# Patient Record
Sex: Male | Born: 1940 | Race: White | Hispanic: No | Smoking: Former smoker
Health system: Southern US, Community
[De-identification: ages and names within clinical notes are randomized; demographics above are authoritative.]

## PROBLEM LIST (undated history)

## (undated) DIAGNOSIS — F419 Anxiety disorder, unspecified: Secondary | ICD-10-CM

## (undated) DIAGNOSIS — IMO0002 Reserved for concepts with insufficient information to code with codable children: Secondary | ICD-10-CM

## (undated) DIAGNOSIS — K4090 Unilateral inguinal hernia, without obstruction or gangrene, not specified as recurrent: Secondary | ICD-10-CM

## (undated) DIAGNOSIS — K750 Abscess of liver: Secondary | ICD-10-CM

## (undated) DIAGNOSIS — E785 Hyperlipidemia, unspecified: Secondary | ICD-10-CM

## (undated) DIAGNOSIS — N183 Chronic kidney disease, stage 3 (moderate): Secondary | ICD-10-CM

## (undated) DIAGNOSIS — K8020 Calculus of gallbladder without cholecystitis without obstruction: Secondary | ICD-10-CM

## (undated) DIAGNOSIS — F32A Depression, unspecified: Secondary | ICD-10-CM

## (undated) DIAGNOSIS — K5730 Diverticulosis of large intestine without perforation or abscess without bleeding: Secondary | ICD-10-CM

## (undated) DIAGNOSIS — I4510 Unspecified right bundle-branch block: Secondary | ICD-10-CM

## (undated) DIAGNOSIS — N400 Enlarged prostate without lower urinary tract symptoms: Secondary | ICD-10-CM

## (undated) DIAGNOSIS — I959 Hypotension, unspecified: Secondary | ICD-10-CM

## (undated) DIAGNOSIS — J309 Allergic rhinitis, unspecified: Secondary | ICD-10-CM

## (undated) DIAGNOSIS — K8050 Calculus of bile duct without cholangitis or cholecystitis without obstruction: Secondary | ICD-10-CM

## (undated) DIAGNOSIS — M719 Bursopathy, unspecified: Secondary | ICD-10-CM

## (undated) DIAGNOSIS — T7840XA Allergy, unspecified, initial encounter: Secondary | ICD-10-CM

## (undated) DIAGNOSIS — F528 Other sexual dysfunction not due to a substance or known physiological condition: Secondary | ICD-10-CM

## (undated) DIAGNOSIS — I10 Essential (primary) hypertension: Secondary | ICD-10-CM

## (undated) DIAGNOSIS — M67919 Unspecified disorder of synovium and tendon, unspecified shoulder: Secondary | ICD-10-CM

## (undated) DIAGNOSIS — L989 Disorder of the skin and subcutaneous tissue, unspecified: Secondary | ICD-10-CM

## (undated) DIAGNOSIS — Z86010 Personal history of colonic polyps: Secondary | ICD-10-CM

## (undated) DIAGNOSIS — I2510 Atherosclerotic heart disease of native coronary artery without angina pectoris: Secondary | ICD-10-CM

## (undated) DIAGNOSIS — M1990 Unspecified osteoarthritis, unspecified site: Secondary | ICD-10-CM

## (undated) DIAGNOSIS — E119 Type 2 diabetes mellitus without complications: Secondary | ICD-10-CM

## (undated) HISTORY — DX: Reserved for concepts with insufficient information to code with codable children: IMO0002

## (undated) HISTORY — DX: Benign prostatic hyperplasia without lower urinary tract symptoms: N40.0

## (undated) HISTORY — DX: Atherosclerotic heart disease of native coronary artery without angina pectoris: I25.10

## (undated) HISTORY — DX: Other sexual dysfunction not due to a substance or known physiological condition: F52.8

## (undated) HISTORY — PX: FINGER SURGERY: SHX640

## (undated) HISTORY — DX: Allergic rhinitis, unspecified: J30.9

## (undated) HISTORY — DX: Unspecified right bundle-branch block: I45.10

## (undated) HISTORY — PX: TONSILLECTOMY: SUR1361

## (undated) HISTORY — PX: HEMORRHOID SURGERY: SHX153

## (undated) HISTORY — DX: Calculus of bile duct without cholangitis or cholecystitis without obstruction: K80.50

## (undated) HISTORY — DX: Chronic kidney disease, stage 3 (moderate): N18.3

## (undated) HISTORY — DX: Calculus of gallbladder without cholecystitis without obstruction: K80.20

## (undated) HISTORY — DX: Hyperlipidemia, unspecified: E78.5

## (undated) HISTORY — DX: Diverticulosis of large intestine without perforation or abscess without bleeding: K57.30

## (undated) HISTORY — PX: COLONOSCOPY: SHX174

## (undated) HISTORY — DX: Type 2 diabetes mellitus without complications: E11.9

## (undated) HISTORY — DX: Essential (primary) hypertension: I10

## (undated) HISTORY — DX: Hypotension, unspecified: I95.9

## (undated) HISTORY — PX: POLYPECTOMY: SHX149

## (undated) HISTORY — DX: Unspecified disorder of synovium and tendon, unspecified shoulder: M67.919

## (undated) HISTORY — DX: Personal history of colonic polyps: Z86.010

## (undated) HISTORY — DX: Bursopathy, unspecified: M71.9

## (undated) HISTORY — PX: INGUINAL HERNIA REPAIR: SUR1180

## (undated) HISTORY — DX: Allergy, unspecified, initial encounter: T78.40XA

## (undated) HISTORY — DX: Disorder of the skin and subcutaneous tissue, unspecified: L98.9

## (undated) HISTORY — DX: Unspecified osteoarthritis, unspecified site: M19.90

## (undated) HISTORY — DX: Unilateral inguinal hernia, without obstruction or gangrene, not specified as recurrent: K40.90

---

## 1961-07-24 DIAGNOSIS — H9319 Tinnitus, unspecified ear: Secondary | ICD-10-CM | POA: Insufficient documentation

## 1970-10-15 HISTORY — PX: WISDOM TOOTH EXTRACTION: SHX21

## 1971-10-16 HISTORY — PX: HEMORRHOID SURGERY: SHX153

## 2000-02-06 ENCOUNTER — Ambulatory Visit (HOSPITAL_COMMUNITY): Admission: RE | Admit: 2000-02-06 | Discharge: 2000-02-06 | Payer: Self-pay | Admitting: Gastroenterology

## 2000-02-06 ENCOUNTER — Encounter (INDEPENDENT_AMBULATORY_CARE_PROVIDER_SITE_OTHER): Payer: Self-pay | Admitting: *Deleted

## 2000-02-06 ENCOUNTER — Encounter: Payer: Self-pay | Admitting: Internal Medicine

## 2002-05-14 ENCOUNTER — Encounter: Payer: Self-pay | Admitting: Internal Medicine

## 2002-08-28 ENCOUNTER — Encounter: Payer: Self-pay | Admitting: Internal Medicine

## 2002-08-28 ENCOUNTER — Ambulatory Visit (HOSPITAL_COMMUNITY): Admission: RE | Admit: 2002-08-28 | Discharge: 2002-08-28 | Payer: Self-pay | Admitting: Gastroenterology

## 2002-08-28 ENCOUNTER — Encounter (INDEPENDENT_AMBULATORY_CARE_PROVIDER_SITE_OTHER): Payer: Self-pay | Admitting: Specialist

## 2003-05-10 ENCOUNTER — Encounter: Payer: Self-pay | Admitting: Internal Medicine

## 2003-05-10 ENCOUNTER — Encounter (INDEPENDENT_AMBULATORY_CARE_PROVIDER_SITE_OTHER): Payer: Self-pay | Admitting: *Deleted

## 2003-05-10 ENCOUNTER — Ambulatory Visit (HOSPITAL_COMMUNITY): Admission: RE | Admit: 2003-05-10 | Discharge: 2003-05-10 | Payer: Self-pay | Admitting: Gastroenterology

## 2004-10-18 ENCOUNTER — Ambulatory Visit: Payer: Self-pay | Admitting: Internal Medicine

## 2005-10-22 ENCOUNTER — Ambulatory Visit: Payer: Self-pay | Admitting: Internal Medicine

## 2007-06-03 ENCOUNTER — Ambulatory Visit: Payer: Self-pay | Admitting: Internal Medicine

## 2007-06-03 LAB — CONVERTED CEMR LAB
ALT: 17 units/L (ref 0–53)
AST: 21 units/L (ref 0–37)
Albumin: 4.1 g/dL (ref 3.5–5.2)
Alkaline Phosphatase: 87 units/L (ref 39–117)
BUN: 11 mg/dL (ref 6–23)
Basophils Absolute: 0 10*3/uL (ref 0.0–0.1)
Basophils Relative: 0.4 % (ref 0.0–1.0)
Bilirubin Urine: NEGATIVE
Bilirubin, Direct: 0.1 mg/dL (ref 0.0–0.3)
CO2: 31 meq/L (ref 19–32)
Calcium: 9.5 mg/dL (ref 8.4–10.5)
Chloride: 108 meq/L (ref 96–112)
Cholesterol: 216 mg/dL (ref 0–200)
Creatinine, Ser: 1.2 mg/dL (ref 0.4–1.5)
Direct LDL: 113.4 mg/dL
Eosinophils Absolute: 0 10*3/uL (ref 0.0–0.6)
Eosinophils Relative: 0.4 % (ref 0.0–5.0)
GFR calc Af Amer: 78 mL/min
GFR calc non Af Amer: 65 mL/min
Glucose, Bld: 81 mg/dL (ref 70–99)
HCT: 50 % (ref 39.0–52.0)
HDL: 31.7 mg/dL — ABNORMAL LOW (ref >39.0)
Hemoglobin, Urine: NEGATIVE
Hemoglobin: 17 g/dL (ref 13.0–17.0)
Hgb A1c MFr Bld: 6.2 % — ABNORMAL HIGH (ref 4.6–6.0)
Ketones, ur: NEGATIVE mg/dL
Leukocytes, UA: NEGATIVE
Lymphocytes Relative: 21.6 % (ref 12.0–46.0)
MCHC: 33.9 g/dL (ref 30.0–36.0)
MCV: 83.6 fL (ref 78.0–100.0)
Monocytes Absolute: 1 10*3/uL — ABNORMAL HIGH (ref 0.2–0.7)
Monocytes Relative: 10.8 % (ref 3.0–11.0)
Neutro Abs: 6.1 10*3/uL (ref 1.4–7.7)
Neutrophils Relative %: 66.8 % (ref 43.0–77.0)
Nitrite: NEGATIVE
PSA: 3.01 ng/mL (ref 0.10–4.00)
Platelets: 210 10*3/uL (ref 150–400)
Potassium: 4 meq/L (ref 3.5–5.1)
RBC: 5.98 M/uL — ABNORMAL HIGH (ref 4.22–5.81)
RDW: 13.9 % (ref 11.5–14.6)
Sodium: 145 meq/L (ref 135–145)
Specific Gravity, Urine: 1.01 (ref 1.000–1.03)
TSH: 0.7 microintl units/mL (ref 0.35–5.50)
Total Bilirubin: 0.8 mg/dL (ref 0.3–1.2)
Total CHOL/HDL Ratio: 6.8
Total Protein, Urine: NEGATIVE mg/dL
Total Protein: 7.5 g/dL (ref 6.0–8.3)
Triglycerides: 420 mg/dL (ref 0–149)
Urine Glucose: 250 mg/dL — AB
Urobilinogen, UA: 0.2 (ref 0.0–1.0)
VLDL: 84 mg/dL — ABNORMAL HIGH (ref 0–40)
WBC: 9 10*3/uL (ref 4.5–10.5)
pH: 6 (ref 5.0–8.0)

## 2007-06-05 DIAGNOSIS — N400 Enlarged prostate without lower urinary tract symptoms: Secondary | ICD-10-CM

## 2007-06-05 DIAGNOSIS — Z860100 Personal history of colon polyps, unspecified: Secondary | ICD-10-CM

## 2007-06-05 DIAGNOSIS — I10 Essential (primary) hypertension: Secondary | ICD-10-CM

## 2007-06-05 DIAGNOSIS — E785 Hyperlipidemia, unspecified: Secondary | ICD-10-CM | POA: Insufficient documentation

## 2007-06-05 DIAGNOSIS — F528 Other sexual dysfunction not due to a substance or known physiological condition: Secondary | ICD-10-CM

## 2007-06-05 HISTORY — DX: Other sexual dysfunction not due to a substance or known physiological condition: F52.8

## 2007-06-05 HISTORY — DX: Personal history of colon polyps, unspecified: Z86.0100

## 2007-06-05 HISTORY — DX: Benign prostatic hyperplasia without lower urinary tract symptoms: N40.0

## 2007-06-05 HISTORY — DX: Personal history of colonic polyps: Z86.010

## 2007-06-05 HISTORY — DX: Essential (primary) hypertension: I10

## 2007-06-05 HISTORY — DX: Hyperlipidemia, unspecified: E78.5

## 2007-12-04 ENCOUNTER — Ambulatory Visit: Payer: Self-pay | Admitting: Internal Medicine

## 2007-12-04 DIAGNOSIS — E119 Type 2 diabetes mellitus without complications: Secondary | ICD-10-CM | POA: Insufficient documentation

## 2007-12-04 DIAGNOSIS — J309 Allergic rhinitis, unspecified: Secondary | ICD-10-CM

## 2007-12-04 DIAGNOSIS — J069 Acute upper respiratory infection, unspecified: Secondary | ICD-10-CM | POA: Insufficient documentation

## 2007-12-04 DIAGNOSIS — R739 Hyperglycemia, unspecified: Secondary | ICD-10-CM | POA: Insufficient documentation

## 2007-12-04 HISTORY — DX: Allergic rhinitis, unspecified: J30.9

## 2007-12-04 HISTORY — DX: Type 2 diabetes mellitus without complications: E11.9

## 2008-04-01 ENCOUNTER — Telehealth: Payer: Self-pay | Admitting: Internal Medicine

## 2008-05-28 ENCOUNTER — Ambulatory Visit: Payer: Self-pay | Admitting: Internal Medicine

## 2008-05-28 LAB — CONVERTED CEMR LAB
ALT: 21 units/L (ref 0–53)
AST: 18 units/L (ref 0–37)
Albumin: 3.8 g/dL (ref 3.5–5.2)
Alkaline Phosphatase: 55 units/L (ref 39–117)
BUN: 6 mg/dL (ref 6–23)
Basophils Absolute: 0 10*3/uL (ref 0.0–0.1)
Basophils Relative: 0.7 % (ref 0.0–3.0)
Bilirubin Urine: NEGATIVE
Bilirubin, Direct: 0.1 mg/dL (ref 0.0–0.3)
CO2: 26 meq/L (ref 19–32)
Calcium: 8.9 mg/dL (ref 8.4–10.5)
Chloride: 106 meq/L (ref 96–112)
Cholesterol: 215 mg/dL (ref 0–200)
Creatinine, Ser: 1.2 mg/dL (ref 0.4–1.5)
Direct LDL: 110.3 mg/dL
Eosinophils Absolute: 0.1 10*3/uL (ref 0.0–0.7)
Eosinophils Relative: 1.2 % (ref 0.0–5.0)
GFR calc Af Amer: 78 mL/min
GFR calc non Af Amer: 64 mL/min
Glucose, Bld: 134 mg/dL — ABNORMAL HIGH (ref 70–99)
HCT: 49.3 % (ref 39.0–52.0)
HDL: 30.6 mg/dL — ABNORMAL LOW (ref >39.0)
Hemoglobin, Urine: NEGATIVE
Hemoglobin: 16.7 g/dL (ref 13.0–17.0)
Hgb A1c MFr Bld: 6.5 % — ABNORMAL HIGH (ref 4.6–6.0)
Ketones, ur: NEGATIVE mg/dL
Leukocytes, UA: NEGATIVE
Lymphocytes Relative: 27 % (ref 12.0–46.0)
MCHC: 33.8 g/dL (ref 30.0–36.0)
MCV: 87.1 fL (ref 78.0–100.0)
Monocytes Absolute: 0.7 10*3/uL (ref 0.1–1.0)
Monocytes Relative: 10.3 % (ref 3.0–12.0)
Neutro Abs: 4.2 10*3/uL (ref 1.4–7.7)
Neutrophils Relative %: 60.8 % (ref 43.0–77.0)
Nitrite: NEGATIVE
PSA: 1.58 ng/mL (ref 0.10–4.00)
Platelets: 185 10*3/uL (ref 150–400)
Potassium: 3.9 meq/L (ref 3.5–5.1)
RBC: 5.66 M/uL (ref 4.22–5.81)
RDW: 13.2 % (ref 11.5–14.6)
Sodium: 140 meq/L (ref 135–145)
Specific Gravity, Urine: 1.01 (ref 1.000–1.03)
TSH: 0.74 microintl units/mL (ref 0.35–5.50)
Total Bilirubin: 0.9 mg/dL (ref 0.3–1.2)
Total CHOL/HDL Ratio: 7
Total Protein, Urine: NEGATIVE mg/dL
Total Protein: 6.7 g/dL (ref 6.0–8.3)
Triglycerides: 400 mg/dL (ref 0–149)
Urine Glucose: NEGATIVE mg/dL
Urobilinogen, UA: 0.2 (ref 0.0–1.0)
VLDL: 80 mg/dL — ABNORMAL HIGH (ref 0–40)
WBC: 6.9 10*3/uL (ref 4.5–10.5)
pH: 6.5 (ref 5.0–8.0)

## 2008-06-03 ENCOUNTER — Encounter (INDEPENDENT_AMBULATORY_CARE_PROVIDER_SITE_OTHER): Payer: Self-pay | Admitting: *Deleted

## 2008-06-03 ENCOUNTER — Ambulatory Visit: Payer: Self-pay | Admitting: Internal Medicine

## 2008-06-03 DIAGNOSIS — L989 Disorder of the skin and subcutaneous tissue, unspecified: Secondary | ICD-10-CM

## 2008-06-03 HISTORY — DX: Disorder of the skin and subcutaneous tissue, unspecified: L98.9

## 2008-06-10 ENCOUNTER — Encounter: Payer: Self-pay | Admitting: Internal Medicine

## 2008-06-17 ENCOUNTER — Telehealth (INDEPENDENT_AMBULATORY_CARE_PROVIDER_SITE_OTHER): Payer: Self-pay | Admitting: *Deleted

## 2008-06-18 ENCOUNTER — Encounter: Payer: Self-pay | Admitting: Internal Medicine

## 2008-07-15 ENCOUNTER — Ambulatory Visit: Payer: Self-pay | Admitting: Internal Medicine

## 2008-07-27 ENCOUNTER — Telehealth: Payer: Self-pay | Admitting: Internal Medicine

## 2008-07-27 LAB — CONVERTED CEMR LAB
Cholesterol: 222 mg/dL (ref 0–200)
Direct LDL: 120 mg/dL
HDL: 31.9 mg/dL — ABNORMAL LOW (ref >39.0)
Total CHOL/HDL Ratio: 7
Triglycerides: 388 mg/dL (ref 0–149)
VLDL: 78 mg/dL — ABNORMAL HIGH (ref 0–40)

## 2008-07-29 ENCOUNTER — Telehealth: Payer: Self-pay | Admitting: Internal Medicine

## 2008-08-11 ENCOUNTER — Encounter: Payer: Self-pay | Admitting: Internal Medicine

## 2008-09-02 ENCOUNTER — Ambulatory Visit: Payer: Self-pay | Admitting: Internal Medicine

## 2008-09-02 LAB — CONVERTED CEMR LAB
Cholesterol: 221 mg/dL (ref 0–200)
Direct LDL: 104.8 mg/dL
HDL: 36 mg/dL — ABNORMAL LOW (ref >39.0)
Total CHOL/HDL Ratio: 6.1
Triglycerides: 285 mg/dL (ref 0–149)
VLDL: 57 mg/dL — ABNORMAL HIGH (ref 0–40)

## 2008-11-18 ENCOUNTER — Ambulatory Visit: Payer: Self-pay | Admitting: Internal Medicine

## 2008-12-02 ENCOUNTER — Ambulatory Visit: Payer: Self-pay | Admitting: Internal Medicine

## 2008-12-02 ENCOUNTER — Encounter: Payer: Self-pay | Admitting: Internal Medicine

## 2008-12-06 ENCOUNTER — Encounter: Payer: Self-pay | Admitting: Internal Medicine

## 2008-12-30 ENCOUNTER — Telehealth (INDEPENDENT_AMBULATORY_CARE_PROVIDER_SITE_OTHER): Payer: Self-pay | Admitting: *Deleted

## 2008-12-31 ENCOUNTER — Telehealth: Payer: Self-pay | Admitting: Internal Medicine

## 2009-01-21 ENCOUNTER — Encounter: Payer: Self-pay | Admitting: Internal Medicine

## 2009-07-15 ENCOUNTER — Ambulatory Visit: Payer: Self-pay | Admitting: Internal Medicine

## 2009-07-15 LAB — CONVERTED CEMR LAB
ALT: 23 units/L (ref 0–53)
AST: 18 units/L (ref 0–37)
Albumin: 3.9 g/dL (ref 3.5–5.2)
Alkaline Phosphatase: 52 units/L (ref 39–117)
BUN: 13 mg/dL (ref 6–23)
Basophils Absolute: 0 10*3/uL (ref 0.0–0.1)
Basophils Relative: 0.4 % (ref 0.0–3.0)
Bilirubin Urine: NEGATIVE
Bilirubin, Direct: 0.1 mg/dL (ref 0.0–0.3)
CO2: 30 meq/L (ref 19–32)
Calcium: 9.3 mg/dL (ref 8.4–10.5)
Chloride: 107 meq/L (ref 96–112)
Cholesterol: 212 mg/dL — ABNORMAL HIGH (ref 0–200)
Creatinine, Ser: 1.4 mg/dL (ref 0.4–1.5)
Direct LDL: 121.7 mg/dL
Eosinophils Absolute: 0.1 10*3/uL (ref 0.0–0.7)
Eosinophils Relative: 1.3 % (ref 0.0–5.0)
GFR calc non Af Amer: 53.69 mL/min (ref >60)
Glucose, Bld: 118 mg/dL — ABNORMAL HIGH (ref 70–99)
HCT: 49.2 % (ref 39.0–52.0)
HDL: 32.1 mg/dL — ABNORMAL LOW (ref >39.00)
Hemoglobin, Urine: NEGATIVE
Hemoglobin: 16.5 g/dL (ref 13.0–17.0)
Hgb A1c MFr Bld: 6.5 % (ref 4.6–6.5)
Ketones, ur: NEGATIVE mg/dL
Leukocytes, UA: NEGATIVE
Lymphocytes Relative: 26.1 % (ref 12.0–46.0)
Lymphs Abs: 1.9 10*3/uL (ref 0.7–4.0)
MCHC: 33.6 g/dL (ref 30.0–36.0)
MCV: 86.9 fL (ref 78.0–100.0)
Monocytes Absolute: 0.8 10*3/uL (ref 0.1–1.0)
Monocytes Relative: 10.7 % (ref 3.0–12.0)
Neutro Abs: 4.4 10*3/uL (ref 1.4–7.7)
Neutrophils Relative %: 61.5 % (ref 43.0–77.0)
Nitrite: NEGATIVE
PSA: 1.88 ng/mL (ref 0.10–4.00)
Platelets: 167 10*3/uL (ref 150.0–400.0)
Potassium: 4 meq/L (ref 3.5–5.1)
RBC: 5.66 M/uL (ref 4.22–5.81)
RDW: 13.5 % (ref 11.5–14.6)
Sodium: 140 meq/L (ref 135–145)
Specific Gravity, Urine: <=1.005 (ref 1.000–1.030)
TSH: 0.59 microintl units/mL (ref 0.35–5.50)
Total Bilirubin: 0.6 mg/dL (ref 0.3–1.2)
Total CHOL/HDL Ratio: 7
Total Protein, Urine: NEGATIVE mg/dL
Total Protein: 7.1 g/dL (ref 6.0–8.3)
Triglycerides: 260 mg/dL — ABNORMAL HIGH (ref 0.0–149.0)
Urine Glucose: NEGATIVE mg/dL
Urobilinogen, UA: 0.2 (ref 0.0–1.0)
VLDL: 52 mg/dL — ABNORMAL HIGH (ref 0.0–40.0)
WBC: 7.2 10*3/uL (ref 4.5–10.5)
pH: 6 (ref 5.0–8.0)

## 2009-07-21 ENCOUNTER — Ambulatory Visit: Payer: Self-pay | Admitting: Internal Medicine

## 2009-07-21 DIAGNOSIS — K5730 Diverticulosis of large intestine without perforation or abscess without bleeding: Secondary | ICD-10-CM | POA: Insufficient documentation

## 2009-07-21 DIAGNOSIS — I4510 Unspecified right bundle-branch block: Secondary | ICD-10-CM

## 2009-07-21 HISTORY — DX: Diverticulosis of large intestine without perforation or abscess without bleeding: K57.30

## 2009-07-21 HISTORY — DX: Unspecified right bundle-branch block: I45.10

## 2009-09-28 ENCOUNTER — Emergency Department (HOSPITAL_COMMUNITY): Admission: EM | Admit: 2009-09-28 | Discharge: 2009-09-28 | Payer: Self-pay | Admitting: Emergency Medicine

## 2010-06-06 ENCOUNTER — Encounter: Payer: Self-pay | Admitting: Internal Medicine

## 2010-06-06 ENCOUNTER — Telehealth: Payer: Self-pay | Admitting: Internal Medicine

## 2010-09-29 ENCOUNTER — Ambulatory Visit: Payer: Self-pay | Admitting: Internal Medicine

## 2010-11-02 ENCOUNTER — Other Ambulatory Visit: Payer: Self-pay | Admitting: Internal Medicine

## 2010-11-02 ENCOUNTER — Ambulatory Visit
Admission: RE | Admit: 2010-11-02 | Discharge: 2010-11-02 | Payer: Self-pay | Source: Home / Self Care | Attending: Internal Medicine | Admitting: Internal Medicine

## 2010-11-02 LAB — CBC WITH DIFFERENTIAL/PLATELET
Basophils Absolute: 0 10*3/uL (ref 0.0–0.1)
Basophils Relative: 0.4 % (ref 0.0–3.0)
Eosinophils Absolute: 0.1 10*3/uL (ref 0.0–0.7)
Eosinophils Relative: 0.9 % (ref 0.0–5.0)
HCT: 46.5 % (ref 39.0–52.0)
Hemoglobin: 15.9 g/dL (ref 13.0–17.0)
Lymphocytes Relative: 23 % (ref 12.0–46.0)
Lymphs Abs: 2 10*3/uL (ref 0.7–4.0)
MCHC: 34.2 g/dL (ref 30.0–36.0)
MCV: 87.9 fl (ref 78.0–100.0)
Monocytes Absolute: 1 10*3/uL (ref 0.1–1.0)
Monocytes Relative: 12.2 % — ABNORMAL HIGH (ref 3.0–12.0)
Neutro Abs: 5.4 10*3/uL (ref 1.4–7.7)
Neutrophils Relative %: 63.5 % (ref 43.0–77.0)
Platelets: 188 10*3/uL (ref 150.0–400.0)
RBC: 5.29 Mil/uL (ref 4.22–5.81)
RDW: 14.5 % (ref 11.5–14.6)
WBC: 8.5 10*3/uL (ref 4.5–10.5)

## 2010-11-02 LAB — URINALYSIS
Bilirubin Urine: NEGATIVE
Hemoglobin, Urine: NEGATIVE
Ketones, ur: NEGATIVE
Leukocytes, UA: NEGATIVE
Nitrite: NEGATIVE
Specific Gravity, Urine: 1.01 (ref 1.000–1.030)
Total Protein, Urine: NEGATIVE
Urine Glucose: NEGATIVE
Urobilinogen, UA: 0.2 (ref 0.0–1.0)
pH: 6 (ref 5.0–8.0)

## 2010-11-02 LAB — HEPATIC FUNCTION PANEL
ALT: 22 U/L (ref 0–53)
AST: 20 U/L (ref 0–37)
Albumin: 3.9 g/dL (ref 3.5–5.2)
Alkaline Phosphatase: 61 U/L (ref 39–117)
Bilirubin, Direct: 0.1 mg/dL (ref 0.0–0.3)
Total Bilirubin: 1.1 mg/dL (ref 0.3–1.2)
Total Protein: 7.2 g/dL (ref 6.0–8.3)

## 2010-11-02 LAB — BASIC METABOLIC PANEL
BUN: 14 mg/dL (ref 6–23)
CO2: 28 mEq/L (ref 19–32)
Calcium: 9.1 mg/dL (ref 8.4–10.5)
Chloride: 101 mEq/L (ref 96–112)
Creatinine, Ser: 1.3 mg/dL (ref 0.4–1.5)
GFR: 56.26 mL/min — ABNORMAL LOW (ref >60.00)
Glucose, Bld: 133 mg/dL — ABNORMAL HIGH (ref 70–99)
Potassium: 4.4 mEq/L (ref 3.5–5.1)
Sodium: 136 mEq/L (ref 135–145)

## 2010-11-02 LAB — TSH: TSH: 0.58 u[IU]/mL (ref 0.35–5.50)

## 2010-11-02 LAB — LIPID PANEL
Cholesterol: 232 mg/dL — ABNORMAL HIGH (ref 0–200)
HDL: 34.5 mg/dL — ABNORMAL LOW (ref >39.00)
Total CHOL/HDL Ratio: 7
Triglycerides: 412 mg/dL — ABNORMAL HIGH (ref 0.0–149.0)
VLDL: 82.4 mg/dL — ABNORMAL HIGH (ref 0.0–40.0)

## 2010-11-02 LAB — LDL CHOLESTEROL, DIRECT: Direct LDL: 118.9 mg/dL

## 2010-11-02 LAB — PSA: PSA: 2.25 ng/mL (ref 0.10–4.00)

## 2010-11-06 ENCOUNTER — Ambulatory Visit
Admission: RE | Admit: 2010-11-06 | Discharge: 2010-11-06 | Payer: Self-pay | Source: Home / Self Care | Attending: Internal Medicine | Admitting: Internal Medicine

## 2010-11-06 ENCOUNTER — Other Ambulatory Visit: Payer: Self-pay | Admitting: Internal Medicine

## 2010-11-06 ENCOUNTER — Encounter: Payer: Self-pay | Admitting: Internal Medicine

## 2010-11-06 DIAGNOSIS — M719 Bursopathy, unspecified: Secondary | ICD-10-CM

## 2010-11-06 DIAGNOSIS — K4090 Unilateral inguinal hernia, without obstruction or gangrene, not specified as recurrent: Secondary | ICD-10-CM | POA: Insufficient documentation

## 2010-11-06 DIAGNOSIS — M67919 Unspecified disorder of synovium and tendon, unspecified shoulder: Secondary | ICD-10-CM | POA: Insufficient documentation

## 2010-11-06 HISTORY — DX: Unilateral inguinal hernia, without obstruction or gangrene, not specified as recurrent: K40.90

## 2010-11-06 HISTORY — DX: Bursopathy, unspecified: M67.919

## 2010-11-06 HISTORY — DX: Unspecified disorder of synovium and tendon, unspecified shoulder: M71.9

## 2010-11-06 LAB — HEMOGLOBIN A1C: Hgb A1c MFr Bld: 6.8 % — ABNORMAL HIGH (ref 4.6–6.5)

## 2010-11-14 NOTE — Progress Notes (Signed)
Summary: Refills and referral  Phone Note Call from Patient Call back at Home Phone [phone]   Caller: Patient Reason for Call: Refill Medication, Referral Summary of Call: Patient wants medication refills sent to [facility] on [address], 90-day supply for best price.  Also, patient wants referral to [facility] for colonoscopy. Initial call taken by: Leonette Monarch Site Manager,  April 01, 2008 2:44 PM  Follow-up for Phone Call        ok for refills as per routine - to [facility]; next colonoscopy not due according to our information until 10/2008 Follow-up by: Corwin Levins MD,  April 01, 2008 3:19 PM  Additional Follow-up for Phone Call Additional follow up Details #1::        Patient notified and rx sent Additional Follow-up by: Rock Nephew CMA,  April 01, 2008 4:43 PM      Prescriptions: TERAZOSIN HCL 2 MG  CAPS (TERAZOSIN HCL) 1po qhs  #90 x 3   Entered by:   Rock Nephew CMA   Authorized by:   Corwin Levins MD   Signed by:   Rock Nephew CMA on 04/01/2008   Method used:   Faxed to ...       [facility]       [address]       [place], [place]  [address]       Ph: [phone]       Fax: [phone]   RxID:   [id number] LISINOPRIL 20 MG  TABS (LISINOPRIL) 1 by mouth qd  #90 x 3   Entered by:   Rock Nephew CMA   Authorized by:   Corwin Levins MD   Signed by:   Rock Nephew CMA on 04/01/2008   Method used:   Faxed to ...       [facility]       [address]       [place], [place]  [address]       Ph: [phone]       Fax: [phone]   RxID:   [id number]

## 2010-11-14 NOTE — Progress Notes (Signed)
  Phone Note Call from Patient Call back at [facility] Phone [phone]   Caller: Patient.[name] KitchenMarland KitchenMarland [phone] Call For: Corwin Levins MD Summary of Call: per pt call pt disagee about the msg he got LMOPT - labs with abnormal lipids - needs oV .Neil [name] states he does not  want to come in reguarding this ,he stated he was told the same thing in Aug  on the phone tree.. pt states he feel fine .. does not know why he should come in reguarding this .should he medication  be adjusted, he was here in august..[name] KitchenJahmal [name] states he continue the otc cholestrol med he was told to take .Neil [name] upset and disagree what  he was told on the phone tree about him not been here over a year . want to know if Dr Jonny Ruiz was aware he just came in this past August. pt demanding explanation of this    Initial call taken by: Shelbie Proctor,  July 29, 2008 11:06 AM  Follow-up for Phone Call        sorry for any misunderstanding; we can refer to [facility] if he desires, o/w he is certainly free to do as he chooses Follow-up by: Corwin Levins MD,  July 29, 2008 1:09 PM  Additional Follow-up for Phone Call Additional follow up Details #1::        July 30, 2008 8:41 AM  pt want to know if he should  continue  to take   slo -niacin  ,  what are  the results of his labs for  oct 1 Additional Follow-up by: Shelbie Proctor,  July 30, 2008 8:43 AM    Additional Follow-up for Phone Call Additional follow up Details #2::    certsainly fine to take ths since his LDL was elevated at 120 Follow-up by: Corwin Levins MD,  July 30, 2008 10:22 AM    Appended Document:  I spoke with Pt reguarding labs. Pt agreed to take med for 4 weeks and recheck lipids after the 4 weeks of meds at the recommended dosage

## 2010-11-14 NOTE — Progress Notes (Signed)
Summary: followup on pathology report  Phone Note Other Incoming   Summary of Call: Pt. went to [facility] and call rec'd from Rachael ZO:XWRUE path reports and they are in EMR but under Dr.John's name.   Dr.Perry wanted to review those results which are  from 04, 03and 01 per Fleet Contras. Initial call taken by: Teryl Lucy RN,  December 30, 2008 12:31 PM  Follow-up for Phone Call        I reviewed the old pathology. 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 No change in surveillance recommendations. Follow-up by: Hilarie Fredrickson MD,  December 30, 2008 1:08 PM      Appended Document: followup on pathology report pt. ntfd. that Dr.perry reviwed other path. reports and recall will be 3 years

## 2010-11-14 NOTE — Progress Notes (Signed)
Summary: call pt  Phone Note Call from Patient Call back at [facility] Phone [phone]   Caller: Patient Summary of Call: LMOPT - labs with abnormal lipids - needs oV .[name] KitchenMarland Kitchenplease call pt to inform pt sates this is not on phone tree  Initial call taken by: Shelbie Proctor,  July 27, 2008 4:40 PM

## 2010-11-14 NOTE — Medication Information (Signed)
Summary: Authorization for Diabetes Testing Supplies/[facility]  Authorization for Diabetes Testing Supplies/[facility]   Imported By: Sherian Rein 01/25/2009 13:23:42  _____________________________________________________________________  External Attachment:    Type:   Image     Comment:   External Document

## 2010-11-14 NOTE — Letter (Signed)
Summary: [facility] Consult Scheduled Letter  [facility]  [address] [place], [place] [address]   Phone: [phone]  Fax: [phone]      06/03/2008 MRN: [id number]  Neil [name] [address] [place], [place]  [address]    Dear Mr. Crayton,      We have scheduled an appointment for you.  At the recommendation of Dr.John, we have scheduled you a consult with Dr Margo Aye on 06/18/08 at 12:30pm.  Their phone number is [phone].  If this appointment day and time is not convenient for you, please feel free to call the office of the doctor you are being referred to at the number listed above and reschedule the appointment.     [facility] [address]  [place], [place] [address]    *Please arrive 15 minutes prior to appointment time.*     Thank you,  Patient Care Coordinator [facility]

## 2010-11-14 NOTE — Medication Information (Signed)
Summary: Diabetes Supplies/[facility]  Diabetes Supplies/[facility]   Imported By: Sherian Rein 06/12/2010 08:21:13  _____________________________________________________________________  External Attachment:    Type:   Image     Comment:   External Document

## 2010-11-14 NOTE — Letter (Signed)
Summary: old ov notes/[facility]  old ov notes/[facility]   Imported By: Lester Kunkle 06/29/2008 09:41:59  _____________________________________________________________________  External Attachment:    Type:   Image     Comment:   External Document

## 2010-11-14 NOTE — Assessment & Plan Note (Signed)
Summary: cpx   Vital Signs:  Patient Profile:   70 Years Old Male Weight:      185 pounds Temp:     97.7 degrees F oral Pulse rate:   80 / minute BP sitting:   150 / 88  (right arm) Cuff size:   regular  Vitals Entered By: Jerilynn Mages (June 03, 2008 8:27 AM)                  Chief Complaint:  CPX.  History of Present Illness: overall doing well, BP at home mild elev as well     Updated Prior Medication List: BENAZEPRIL HCL 40 MG  TABS (BENAZEPRIL HCL) 1 by mouth once daily TERAZOSIN HCL 2 MG  CAPS (TERAZOSIN HCL) 1po at bedtime CIALIS 20 MG  TABS (TADALAFIL)  ZETIA 10 MG  TABS (EZETIMIBE) 1 by mouth once daily  Current Allergies (reviewed today): ! * STATINS ! * ASA ENTERIC COATED  Past Medical History:    Reviewed history from 12/04/2007 and no changes required:       Hyperlipidemia       Hypertension       Benign prostatic hypertrophy       Erectile Dysfunction       Colonic polyps, hx of       Allergic rhinitis       hx of prostatitis       Diabetes mellitus, type II  Past Surgical History:    Reviewed history from 06/05/2007 and no changes required:       Hemorrhoidectomy       Tonsillectomy   Family History:    Reviewed history from 12/04/2007 and no changes required:       mother with colon cancer  Social History:    Reviewed history from 12/04/2007 and no changes required:       Current Smoker       Alcohol use-rare       Married       retired - [employer] [employer]    Review of Systems  The patient denies anorexia, fever, weight loss, weight gain, vision loss, decreased hearing, hoarseness, chest pain, syncope, dyspnea on exertion, peripheral edema, prolonged cough, headaches, hemoptysis, abdominal pain, melena, hematochezia, severe indigestion/heartburn, hematuria, incontinence, genital sores, muscle weakness, suspicious skin lesions, transient blindness, difficulty walking, depression, unusual weight change, abnormal bleeding,  enlarged lymph nodes, angioedema, breast masses, and testicular masses.         all otherwise negative , due for colon f/u - but wants to have it done here   Physical Exam  General:     alert and well-developed.   Head:     Normocephalic and atraumatic without obvious abnormalities. No apparent alopecia or balding. Eyes:     No corneal or conjunctival inflammation noted. EOMI. Perrla. Ears:     External ear exam shows no significant lesions or deformities.  Otoscopic examination reveals clear canals, tympanic membranes are intact bilaterally without bulging, retraction, inflammation or discharge. Hearing is grossly normal bilaterally. Nose:     External nasal examination shows no deformity or inflammation. Nasal mucosa are pink and moist without lesions or exudates. Mouth:     Oral mucosa and oropharynx without lesions or exudates.  Teeth in good repair. Neck:     No deformities, masses, or tenderness noted. Lungs:     Normal respiratory effort, chest expands symmetrically. Lungs are clear to auscultation, no crackles or wheezes. Heart:     Normal rate  and regular rhythm. S1 and S2 normal without gallop, murmur, click, rub or other extra sounds. Abdomen:     Bowel sounds positive,abdomen soft and non-tender without masses, organomegaly or hernias noted. Genitalia:     Testes bilaterally descended without nodularity, tenderness or masses. No scrotal masses or lesions. No penis lesions or urethral discharge. Msk:     normal ROM, no joint tenderness, and no joint swelling.   Extremities:     no edema, no ulcers  Neurologic:     alert & oriented X3, cranial nerves II-XII intact, and strength normal in all extremities.   Skin:     several skin lesions to back, on in particular to right mid back approx 10 mm, mild raised and black    Impression & Recommendations:  Problem # 1:  Preventive Health Care (ICD-V70.0) Overall doing well, up to date, counseled on routine health concerns  for screening and prevention, immunizations up to date or declined, labs reviewed, ecg reviewed , to be sched for colon as he is due, and pneumovax today   Orders: EKG w/ Interpretation (93[address])   Problem # 2:  SKIN LESION (ICD-709.9) right back - refer derm Orders: Dermatology Referral (Derma)   Complete Medication List: 1)  Benazepril Hcl 40 Mg Tabs (Benazepril hcl) .[name].. 1 by mouth once daily 2)  Terazosin Hcl 2 Mg Caps (Terazosin hcl) .[name].. 1po at bedtime 3)  Cialis 20 Mg Tabs (Tadalafil) 4)  Zetia 10 Mg Tabs (Ezetimibe) .[name].. 1 by mouth once daily  Other Orders: Gastroenterology Referral (GI)   Patient Instructions: 1)  please ask at the front desk for the information release form to be  filled out for [place] to send to Dr Loreta Ave 2)  You will be contacted about the referral(s) to: colon testing, and dr hall for derm 3)  finish the lisinopril at 2 per dya (total of 40 mg) 4)  after that, change to the benazepril 40 mg per day 5)  Please schedule a follow-up appointment in 1 year or sooner if needed   Prescriptions: ZETIA 10 MG  TABS (EZETIMIBE) 1 by mouth once daily  #30 x 11   Entered and Authorized by:   Corwin Levins MD   Signed by:   Corwin Levins MD on 06/03/2008   Method used:   Print then Give to Patient   RxID:   [id number] TERAZOSIN HCL 2 MG  CAPS (TERAZOSIN HCL) 1po at bedtime  #90 x 3   Entered and Authorized by:   Corwin Levins MD   Signed by:   Corwin Levins MD on 06/03/2008   Method used:   Print then Give to Patient   RxID:   [id number] CIALIS 20 MG  TABS (TADALAFIL)   #5 x 11   Entered and Authorized by:   Corwin Levins MD   Signed by:   Corwin Levins MD on 06/03/2008   Method used:   Print then Give to Patient   RxID:   [id number] BENAZEPRIL HCL 40 MG  TABS (BENAZEPRIL HCL) 1 by mouth once daily  #90 x 3   Entered and Authorized by:   Corwin Levins MD   Signed by:   Corwin Levins MD on 06/03/2008   Method used:   Print then Give to Patient    RxID:   [id number]  ]  Pneumovax Vaccine    Vaccine Type: Pneumovax    Site: left deltoid    Mfr: Merck  Dose: 0.5 ml    Route: IM    Given by: Jerilynn Mages    Exp. Date: 04/09/2009    Lot #: [id number]    VIS given: 05/12/96 version given June 03, 2008.   Appended Document: cpx also done but not noted above:  lisinopril 20 changed to benazapril 40 mg

## 2010-11-14 NOTE — Progress Notes (Signed)
  Phone Note Call from Patient Call back at Home Phone [phone]   Caller: [phone] Summary of Call: per Neil [name] call states he lost his rx for CIALIS 20 MG  TABS (TADALAFIL) want another rx for this [facility] [address] [place], [place]  [address] Ph: [phone] Fax: [phone] Initial call taken by: Shelbie Proctor,  December 31, 2008 3:38 PM  Follow-up for Phone Call        ok for refill - to [facility] Follow-up by: Corwin Levins MD,  December 31, 2008 3:42 PM      Prescriptions: CIALIS 20 MG  TABS (TADALAFIL)   #5 x 5   Entered by:   Windell Norfolk   Authorized by:   Corwin Levins MD   Signed by:   Windell Norfolk on 12/31/2008   Method used:   Faxed to ...       [facility]       [address]       [place], [place]  [address]       Ph: [phone]       Fax: [phone]   RxID:   [id number]

## 2010-11-14 NOTE — Progress Notes (Signed)
Summary: Alternative   med  Phone Note Call from Patient Call back at Home Phone [phone]   Caller: Patient Call For: Neil Levins MD Summary of Call: per pt call his medication was denied by [facility] is there an Alternative  he can try Initial call taken by: Shelbie Proctor,  June 17, 2008 11:26 AM  Follow-up for Phone Call        only thing left is niacin 500 mg - will do rx - done escript - to check lipids 4 wks 272.0 - staff to arrange please Follow-up by: Neil Levins MD,  June 17, 2008 12:46 PM    Additional Follow-up for Phone Call Additional follow up Details #2::    June 17, 2008 1:50 PM spoke with pt made aware informed pt of his medication that was sent  Follow-up by: Shelbie Proctor,  June 17, 2008 1:51 PM  New/Updated Medications: SLO-NIACIN 500 MG CR-TABS (NIACIN) 1 by mouth once daily   Prescriptions: SLO-NIACIN 500 MG CR-TABS (NIACIN) 1 by mouth once daily  #90 x 3   Entered and Authorized by:   Neil Levins MD   Signed by:   Neil Levins MD on 06/17/2008   Method used:   Electronically to        [facility] (retail)       [address]       [place], [place]  [address]       Ph: [phone]       Fax: [phone]   RxID:   [id number]   Appended Document: Alternative   med enter labs in as well

## 2010-11-14 NOTE — Progress Notes (Signed)
Summary: medication  Phone Note Call from Patient Call back at Home Phone [phone]   Caller: Patient Call For: Corwin Levins MD Summary of Call: per pt  [facility] medication dr Jonny Ruiz sent in . was told the SLO-NIACIN 500 MG CR-TABS (NIACIN)   was an  OTC medication  .per pt want to know if this is true , and if so why did'nt Dr Jonny Ruiz prescribe a medication  Initial call taken by: Shelbie Proctor,  June 17, 2008 2:01 PM  Follow-up for Phone Call        there are no precription other medications; if he can take this at one per day to start, he should increase the pill every 2 wks until he gets to 4 per day, or has hot flashes and should stay at that dose Follow-up by: Corwin Levins MD,  June 17, 2008 2:35 PM  Additional Follow-up for Phone Call Additional follow up Details #1::        June 17, 2008 3:40 PM called pt to inform , made aware Additional Follow-up by: Shelbie Proctor,  June 17, 2008 3:41 PM

## 2010-11-14 NOTE — Procedures (Signed)
Summary: Colonoscopy   Colonoscopy  Procedure date:  12/02/2008  Findings:      Location:  [facility].    Procedures Next Due Date:    Colonoscopy: 12/2011  COLONOSCOPY PROCEDURE REPORT  PATIENT:  Neil [name], Neil [name]  MR#:  [id number] BIRTHDATE:   10/16/1940   GENDER:   male  ENDOSCOPIST:   Wilhemina Bonito. Eda Keys, MD Referred by: Oliver Barre, M.D.  PROCEDURE DATE:  12/02/2008 PROCEDURE:  Colonoscopy with snare polypectomy ASA CLASS:   Class II INDICATIONS: Family history of colon cancer (PARENT 60'S), History of polyps (Reports 3 previous colonoscopies with Dr. Loreta Ave. Last exam 5 years ago. Reports "precancerous" polyps. No details)  MEDICATIONS:    Fentanyl 100 mcg IV, Versed 10 mg IV  DESCRIPTION OF PROCEDURE:   After the risks benefits and alternatives of the procedure were thoroughly explained, informed consent was obtained.  Digital rectal exam was performed and revealed moderate external hemorrhoids.   The LB [id number] endoscope was introduced through the anus and advanced to the cecum, which was identified by both the appendix and ileocecal valve, without limitations. TIME TO CECUM = 2:26 MIN. The quality of the prep was excellent, using MoviPrep.  The instrument was then  withdrawn (TIME = 18:05 MIN) as the colon was fully examined. <<PROCEDUREIMAGES>>              <<OLD IMAGES>>  FINDINGS:  There were multiple polyps identified and removed. 1mm cecal, 76mm,3mm ascending were removed w/ cold snare and submitted. A 15mm pedunculated polyp at 40cm was removed w/ snare cautery and submitted. Severe diverticulosis was found throughout the colon.   Retroflexed views in the rectum revealed no abnormalities.    The scope was then withdrawn from the patient and the procedure completed.  COMPLICATIONS:   None  ENDOSCOPIC IMPRESSION:  1) Polyps, multiple (4) - REMOVED  2) Severe diverticulosis throughout the colon 3) FAMILY HX COLON CANCER   RECOMMENDATIONS:  1)  Follow up colonoscopy in 3 years 2) GET OLD COLONOSCOPY AND PATH REPORTS IF POSSIBLE    _______________________________ Wilhemina Bonito. Eda Keys, MD  CC: Corwin Levins MD;  The Patient    REPORT OF SURGICAL PATHOLOGY   Case #: [id number] Patient Name: MICHOEL, KUNIN. Office Chart Number:  [id number]   MRN: [id number] Pathologist: Ferd Hibbs. Colonel Bald, MD DOB/Age  [birth year] (Age: 54)    Gender: M Date Taken:  12/02/2008 Date Received: 12/02/2008   FINAL DIAGNOSIS   ***MICROSCOPIC EXAMINATION AND DIAGNOSIS***   1.  COLON, CECUM (1) AND ASCENDING (2), POLYPS (3): - TUBULAR ADENOMAS (2).  - SEPARATE FRAGMENT OF PROTEINACEOUS MATERIAL WITH ASSOCIATED ACUTE  INFLAMMATORY CELLS. - HIGH GRADE DYSPLASIA IS NOT IDENTIFIED.    2.  COLON, DESCENDING, POLYP: - TUBULOVILLOUS ADENOMA. - HIGH GRADE DYSPLASIA IS NOT IDENTIFIED.    mj Date Reported:  12/06/2008     Ferd Hibbs. Colonel Bald, MD *** Electronically Signed Out By [facility] ***    December 06, 2008 MRN: [id number]    [facility] [address] [place], [place]  [address]    Dear Mr. Bathe,  I am pleased to inform you that the colon polyp(s) removed during your recent colonoscopy was (were) found to be benign (no cancer detected) upon pathologic examination.  I recommend you have a repeat colonoscopy examination in 3 years to look for recurrent polyps, as having colon polyps increases your risk for having recurrent polyps or even colon cancer in the future.  Should you develop new  or worsening symptoms of abdominal pain, bowel habit changes or bleeding from the rectum or bowels, please schedule an evaluation with either your primary care physician or with me.  Additional information/recommendations:  _X._ No further action with gastroenterology is needed at this time. Please      follow-up with your primary care physician for your other healthcare      needs.  Please call us if you are having persistent problems or have questions about your  condition that have not been fully answered at this time.  Sincerely,  Hilarie Fredrickson MD  This report was created from the original endoscopy report, which was reviewed and signed by the above listed endoscopist. .

## 2010-11-14 NOTE — Progress Notes (Signed)
Summary: medication refill  Phone Note Other Incoming   Caller: [facility] Summary of Call: [facility] requesting to fill patients T/S Bayer contour Test Strip #100 test once daily? Not on patients list at this time. Initial call taken by: Robin Ewing CMA Duncan Dull),  June 06, 2010 8:40 AM  Follow-up for Phone Call       Follow-up by: Corwin Levins MD,  June 06, 2010 12:15 PM    New/Updated Medications: BAYER CONTOUR TEST  STRP (GLUCOSE BLOOD) test once daily Prescriptions: BAYER CONTOUR TEST  STRP (GLUCOSE BLOOD) test once daily  #100 x 3   Entered by:   Scharlene Gloss CMA (AAMA)   Authorized by:   Corwin Levins MD   Signed by:   Scharlene Gloss CMA (AAMA) on 06/06/2010   Method used:   Faxed to ...       [facility]order)             , [place]         Ph:        Fax: [phone]   RxID:   [id number]    ok to fill - to robin to handle Corwin Levins MD  June 06, 2010 12:15 PM

## 2010-11-14 NOTE — Miscellaneous (Signed)
Summary: GI PV  Clinical Lists Changes  Medications: Added new medication of MOVIPREP 100 GM  SOLR (PEG-KCL-NACL-NASULF-NA ASC-C) As per prep instructions. - Signed Rx of MOVIPREP 100 GM  SOLR (PEG-KCL-NACL-NASULF-NA ASC-C) As per prep instructions.;  #1 x 0;  Signed;  Entered by: Barton Fanny RN;  Authorized by: Hilarie Fredrickson MD;  Method used: Electronically [facility]  [address] [id number] [address], [place], [place]  [address], Ph: [phone] or [phone], Fax: [phone] Allergies: Removed allergy or adverse reaction of * ASA ENTERIC COATED Changed allergy or adverse reaction from * STATINS to * STATINS Observations: Added new observation of ALLERGY REV: Done (11/18/2008 12:54)    Prescriptions: MOVIPREP 100 GM  SOLR (PEG-KCL-NACL-NASULF-NA ASC-C) As per prep instructions.  #1 x 0   Entered by:   Barton Fanny RN   Authorized by:   Hilarie Fredrickson MD   Signed by:   Barton Fanny RN on 11/18/2008   Method used:   [facility]        [facility]  [address] [id number]       [address]       [place], [place]  [address]       Ph: [phone] or [phone]       Fax: [phone]   RxID:   [id number]

## 2010-11-14 NOTE — Medication Information (Signed)
Summary: Zetia denied/[employer]  Zetia denied/[employer]   Imported By: Lester Ingram 06/14/2008 11:15:09  _____________________________________________________________________  External Attachment:    Type:   Image     Comment:   External Document

## 2010-11-14 NOTE — Medication Information (Signed)
Summary: [facility]  [facility]   Imported By: Lester Lincoln 06/09/2010 08:04:35  _____________________________________________________________________  External Attachment:    Type:   Image     Comment:   External Document

## 2010-11-14 NOTE — Assessment & Plan Note (Signed)
Summary: 6 MTH FU-STC   Vital Signs:  Patient Profile:   70 Years Old Male Weight:      182 pounds Temp:     97.8 degrees F oral Pulse rate:   93 / minute BP sitting:   172 / 81  (right arm) Cuff size:   regular  Pt. in pain?   no  Vitals Entered By: Maris Berger (December 04, 2007 10:46 AM)                  Preventive Care Screening  Colonoscopy:    Next Due:  10/2008   Chief Complaint:  6 mon. f/u.  History of Present Illness: overall doing quite well, no complaints, tolerating his new meds without difficulty it seems without cough or orthostasis; some mild URI symptoms without fever, slight cough only, BP at home often less than 140 systolic, also cbg's ave about 130 without polys or low sugars    Updated Prior Medication List: LISINOPRIL 20 MG  TABS (LISINOPRIL) 1 by mouth qd TERAZOSIN HCL 2 MG  CAPS (TERAZOSIN HCL) 1po qhs  Current Allergies (reviewed today): ! * STATINS  Past Medical History:    Reviewed history from 06/05/2007 and no changes required:       Hyperlipidemia       Hypertension       Benign prostatic hypertrophy       Erectile Dysfunction       Colonic polyps, hx of       Allergic rhinitis       hx of prostatitis       Diabetes mellitus, type II  Past Surgical History:    Reviewed history from 06/05/2007 and no changes required:       Hemorrhoidectomy       Tonsillectomy   Family History:    Reviewed history and no changes required:       mother with colon cancer  Social History:    Reviewed history and no changes required:       Current Smoker       Alcohol use-rare       Married       retired - [place] [employer]   Risk Factors:  Tobacco use:  current Alcohol use:  no    Physical Exam  General:     Well-developed,well-nourished,in no acute distress; alert,appropriate and cooperative throughout examination Head:     Normocephalic and atraumatic without obvious abnormalities. No apparent alopecia or  balding. Eyes:     No corneal or conjunctival inflammation noted. EOMI. Perrl Ears:     External ear exam shows no significant lesions or deformities.  Otoscopic examination reveals clear canals, tympanic membranes are intact bilaterally without bulging, retraction, inflammation or discharge. Hearing is grossly normal bilaterally. Nose:     External nasal examination shows no deformity or inflammation. Nasal mucosa are pink and moist without lesions or exudates. Mouth:     pharyngeal erythema.   Neck:     No deformities, masses, or tenderness noted. Lungs:     Normal respiratory effort, chest expands symmetrically. Lungs are clear to auscultation, no crackles or wheezes. Heart:     Normal rate and regular rhythm. S1 and S2 normal without gallop, murmur, click, rub or other extra sounds. Extremities:     no edema, ulcers     Impression & Recommendations:  Problem # 1:  URI (ICD-465.9) c/w viral - ok to tx with otc medsInstructed on symptomatic treatment. Call if symptoms  persist or worsen.   Problem # 2:  HYPERTENSION (ICD-401.9)  His updated medication list for this problem includes:    Lisinopril 20 Mg Tabs (Lisinopril) .[name]... 1 by mouth qd    Terazosin Hcl 2 Mg Caps (Terazosin hcl) .[name]... 1po qhs stable, cont meds as is BP today: 172/81  Labs Reviewed: Creat: 1.2 (06/03/2007) Chol: 216 (06/03/2007)   HDL: 31.7 (06/03/2007)   LDL: DEL (06/03/2007)   TG: 420 (06/03/2007)   Problem # 3:  BENIGN PROSTATIC HYPERTROPHY (ICD-600.00) improved on the hytrin - ok to follow  Problem # 4:  DIABETES MELLITUS, TYPE II (ICD-250.00)  His updated medication list for this problem includes:    Lisinopril 20 Mg Tabs (Lisinopril) .[name]... 1 by mouth qd on no OHA yet - ok to cont for now, re-heck next visit Labs Reviewed: HgBA1c: 6.2 (06/03/2007)   Creat: 1.2 (06/03/2007)      Complete Medication List: 1)  Lisinopril 20 Mg Tabs (Lisinopril) .[name].. 1 by mouth qd 2)  Terazosin Hcl 2 Mg Caps  (Terazosin hcl) .[name].. 1po qhs     ]  Appended Document: 6 MTH FU-STC ann - pt needs CPX 6 months with cpx labs and hgba1c - 250.02 -       will need appt and labs done  Appended Document: 6 MTH FU-STC         Additional Follow-up for Phone Call Additional follow up Details #2::    appt made,labs in for8/09 Follow-up by: Maris Berger,  December 04, 2007 5:06 PM

## 2010-11-14 NOTE — Consult Note (Signed)
Summary: atypical nevi/J H Hall,MD  atypical nevi/J H Hall,MD   Imported By: Lester Yettem 08/27/2008 11:00:30  _____________________________________________________________________  External Attachment:    Type:   Image     Comment:   External Document

## 2010-11-14 NOTE — Assessment & Plan Note (Signed)
Summary: CPX/ SECURE HORIZIONS /[facility]  $50   Vital Signs:  Patient profile:   70 year old male Height:      67 inches (170.18 cm) Weight:      183.4 pounds (83.36 kg) BMI:     28.83 O2 Sat:      95 % Temp:     98.8 degrees F (37.11 degrees C) oral Pulse rate:   69 / minute BP sitting:   152 / 86  (right arm) Cuff size:   regular  Vitals Entered By: Orlan Leavens (July 21, 2009 1:15 PM)  Preventive Care Screening     declines pneumonia shot today  CC: CPX Is Patient Diabetic? No Pain Assessment Patient in pain? no      Comments Pt states he is not taking zetia & niacin he stop taking   CC:  CPX.  History of Present Illness: cbg's at home in the low 100's (checks every other day in the am); Pt denies polydipsia, polyuria, or low sugar symptoms such as shakiness improved with eating.  Overall good compliance with meds, trying to follow low chol, DM diet, wt dow n 2 lbs, and startin gthe ellipitical machine at the house.  Pt denies CP, sob, doe, wheezing, orthopnea, pnd, worsening LE edema, palps, dizziness or syncope  Pt denies new neuro symptoms such as headache, facial or extremity weakness .  Quit smoking in march 2007 - overall picked up about 25 lbs since then.  BP at home < 140/90.  Meds are expensive - does not want welchol.    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 1 By Mouth Once Daily-- No Addtnl Refills Pt Overdue For An Appt 2)  Terazosin Hcl 2 Mg  Caps  (Terazosin Hcl) .[name].. 1po At Bedtime 3)  Cialis 20 Mg  Tabs (Tadalafil) 4)  Zetia 10 Mg  Tabs (Ezetimibe) .[name].. 1 By Mouth Once Daily 5)  Slo-Niacin 500 Mg Cr-Tabs (Niacin) .[name].. 1 By Mouth Once Daily  Current Medications (verified): 1)  Benazepril Hcl 40 Mg  Tabs (Benazepril Hcl) .[name].. 1 By Mouth Once Daily 2)  Terazosin Hcl 2 Mg  Caps (Terazosin Hcl) .[name].. 1po At Bedtime 3)  Cialis 20 Mg  Tabs (Tadalafil)  Allergies (verified): 1)  ! Zetia (Ezetimibe) 2)  * Statins  Past History:  Past Surgical History: Last updated: 06/05/2007 Hemorrhoidectomy Tonsillectomy  Family History: Last updated: 12/04/2007 mother with colon cancer  Social History: Last updated: 12/04/2007 Current Smoker Alcohol use-rare Married retired - [place] [employer]  Risk Factors: Smoking Status: current (12/04/2007) Packs/Day: 1 PPD (06/05/2007)  Past Medical History: Hyperlipidemia Hypertension Benign prostatic hypertrophy Erectile Dysfunction Colonic polyps, hx of Allergic rhinitis hx of prostatitis Diabetes mellitus, type II Diverticulosis, colon RBBB  Review of Systems  The patient denies anorexia, fever, weight loss, weight gain, vision loss, decreased hearing, hoarseness, chest pain, syncope, dyspnea on exertion, peripheral edema, prolonged cough, headaches, hemoptysis, abdominal pain, melena, hematochezia, severe indigestion/heartburn, hematuria, incontinence,  muscle weakness, suspicious skin lesions, transient blindness, difficulty walking, depression, unusual weight change, abnormal bleeding, enlarged lymph nodes, angioedema, and breast masses.         all otherwise negative per pt - 12 system review done   Physical Exam  General:  alert and well-developed.   Head:  normocephalic and atraumatic.   Eyes:  vision grossly intact, pupils equal, and pupils round.   Ears:  R ear normal and L ear normal.   Nose:  no external deformity and no nasal discharge.   Mouth:  no gingival  abnormalities and pharynx pink and moist.   Neck:  supple and no masses.   Lungs:  normal respiratory effort and normal breath sounds.   Heart:  normal rate and regular rhythm.   Abdomen:  soft, non-tender, and normal bowel sounds.   Msk:  no joint tenderness and no joint swelling.   Extremities:  no edema, no erythema  Neurologic:  cranial nerves II-XII intact and strength normal in all extremities.     Impression & Recommendations:  Problem # 1:  Preventive Health Care (ICD-V70.0)  Overall doing well, up to date, counseled on routine health concerns for screening and prevention, immunizations up to date or declined, labs reviewed, ecg reviewed   Orders: EKG w/ Interpretation (93000)  Problem # 2:  DIABETES MELLITUS, TYPE II (ICD-250.00)  His updated medication list for this problem includes:    Benazepril Hcl 40 Mg Tabs (Benazepril hcl) .[name]... 1 by mouth once daily  Labs Reviewed: Creat: 1.4 (07/15/2009)    Reviewed HgBA1c results: 6.5 (07/15/2009)  6.5 (05/28/2008) stable overall by hx and exam, ok to continue meds/tx as is   Problem # 3:  HYPERLIPIDEMIA (ICD-272.4)  The following medications were removed from the medication list:    Zetia 10 Mg Tabs (Ezetimibe) .[name]... 1 by mouth once daily    Slo-niacin 500 Mg Cr-tabs (Niacin) .[name]... 1 by mouth once daily declines meds - to cont diet for now per pt  Labs Reviewed: SGOT: 18 (07/15/2009)   SGPT: 23 (07/15/2009)   HDL:32.10 (07/15/2009), 36.0 (09/02/2008)  LDL:DEL (09/02/2008), DEL (07/15/2008)  Chol:212 (07/15/2009), 221 (09/02/2008)  Trig:260.0 (07/15/2009), 285 (09/02/2008)  Problem # 4:  HYPERTENSION (ICD-401.9)  His updated medication list for this problem includes:    Benazepril Hcl 40 Mg Tabs (Benazepril hcl) .[name]... 1 by mouth once daily    Terazosin Hcl 2 Mg Caps (Terazosin hcl) .[name]... 1po at bedtime  BP today: 152/86 Prior BP: 150/88 (06/03/2008)  Labs Reviewed: K+: 4.0 (07/15/2009) Creat: : 1.4 (07/15/2009)    Chol: 212 (07/15/2009)   HDL: 32.10 (07/15/2009)   LDL: DEL (09/02/2008)   TG: 260.0 (07/15/2009) stable overall by hx and exam, ok to continue meds/tx as is  - to cont check BP at home   Complete Medication List: 1)  Benazepril Hcl 40 Mg Tabs (Benazepril hcl) .[name].. 1 by mouth once daily 2)  Terazosin Hcl 2 Mg Caps (Terazosin hcl) .[name].. 1po at bedtime 3)  Cialis 20 Mg Tabs (Tadalafil)  Other Orders: Flu Vaccine 3yrs + (16109) Administration Flu vaccine - MCR (G0008) TD Toxoids IM 7 YR + (60454) Admin 1st Vaccine (09811) Admin of Any Addtl Vaccine (91478) Flu Vaccine Consent Questions     Do you have a history of severe allergic reactions to this vaccine? no    Any prior history of allergic reactions to egg and/or gelatin? no    Do you have a sensitivity to the preservative Thimersol? no  Do you have a past history of Guillan-Barre Syndrome? no    Do you currently have an acute febrile illness? no    Have you ever had a severe reaction to latex? no    Vaccine information given and explained to patient? yes    Are you currently pregnant? no    Lot Number:[id number]   Exp Date:04/13/2010   Site Given  Left Deltoid IM  Patient Instructions: 1)  you had the tetanus and the flu shots today 2)  Continue all previous medications as before this visit 3)  Please schedule a follow-up appointment in 6 months with : 4)  BMP prior to visit, ICD-9: 250.02 5)  Lipid Panel prior to visit, ICD-9: 6)  HbgA1C prior to visit, ICD-9: Prescriptions: TERAZOSIN HCL 2 MG  CAPS (TERAZOSIN HCL) 1po at bedtime  #90 x 3   Entered and Authorized by:   Corwin Levins MD   Signed by:   Corwin Levins MD on 07/21/2009   Method used:   Electronically to        [facility] (retail)       [address]       [place], [place]  [address]       Ph: [phone]       Fax: [phone]   RxID:   [id number] BENAZEPRIL HCL 40 MG  TABS (BENAZEPRIL HCL) 1 by mouth once daily  #90 x 3    Entered and Authorized by:   Corwin Levins MD   Signed by:   Corwin Levins MD on 07/21/2009   Method used:   Electronically to        [facility] (retail)       [address]       [place], [place]  [address]       Ph: [phone]       Fax: [phone]   RxID:   [id number]   .[email or web address]   Immunizations Administered:  Tetanus Vaccine:    Vaccine Type: Td    Site: right deltoid    Mfr: Sanofi Pasteur    Dose: 0.5 ml    Route: IM    Given by: Orlan Leavens    Exp. Date: 01/26/2011    Lot #: [id number]    VIS given: 07/21/09

## 2010-11-16 NOTE — Assessment & Plan Note (Signed)
Summary: cpx-lb   Vital Signs:  Patient profile:   70 year old male Height:      69 inches Weight:      187.50 pounds BMI:     27.79 O2 Sat:      98 % on Room air Temp:     98.5 degrees F oral Pulse rate:   84 / minute BP sitting:   162 / 70  (left arm) Cuff size:   regular  Vitals Entered By: Zella Ball Ewing CMA Duncan Dull) (November 06, 2010 3:23 PM)  O2 Flow:  Room air CC: Adult Physical/RE   CC:  Adult Physical/RE.  History of Present Illness: here for wellness and f/u; overall doing ok;  Pt denies CP, worsening sob, doe, wheezing, orthopnea, pnd, worsening LE edema, palps, dizziness or syncope  Pt denies new neuro symptoms such as headache, facial or extremity weakness  Pt denies polydipsia, polyuria, or low sugar symptoms such as shakiness improved with eating.  Overall good compliance with meds, trying to follow low chol, DM diet, wt stable, little excercise however  No fever, wt loss, night sweats, loss of appetite or other constitutional symptoms  Denies worsening depressive symptoms, suicidal ideation, or panic.   Pt states good ability with ADL's, low fall risk, home safety reviewed and adequate, no significant change in hearing or vision, trying to follow lower chol diet, and occasionally active only with regular excercise.  Overall good compliance with meds, and good tolerability.  CBG's at home in the 130 range usually done in the AM only;  Does have some mild  tenderness to the right shoulder, with discomfort worse to abduct the arm/shoulder;  no neck or other more distal pain.   Pt states BP at home < 140/90 regularly.    Preventive Screening-Counseling & Management      Drug Use:  no.    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 1 By Mouth Once Daily 2)  Terazosin Hcl 2 Mg  Caps (Terazosin Hcl) .[name].. 1po At Bedtime 3)  Cialis 20 Mg  Tabs (Tadalafil) 4)  Bayer Contour Test  Strp (Glucose Blood) .... Test Once Daily  Current Medications (verified): 1)  Benazepril Hcl 40 Mg  Tabs (Benazepril Hcl) .[name].. 1 By Mouth Once Daily 2)  Terazosin Hcl 2 Mg  Caps (Terazosin Hcl) .[name].. 1po At Bedtime 3)  Levitra 20 Mg Tabs (Vardenafil Hcl) .[name].. 1 By Mouth Every Other Day As Needed 4)  Bayer Contour Test  Strp (Glucose Blood) .... Test Once Daily  Allergies (verified): 1)  ! Zetia (Ezetimibe) 2)  * Statins  Past History:  Past Medical History: Last updated: 07/21/2009 Hyperlipidemia Hypertension Benign prostatic hypertrophy Erectile Dysfunction Colonic polyps, hx of Allergic rhinitis hx of prostatitis Diabetes mellitus, type II Diverticulosis, colon RBBB  Past Surgical History: Last updated: 06/05/2007 Hemorrhoidectomy Tonsillectomy  Family History: Last updated: 12/04/2007 mother with  colon cancer  Social History: Last updated: 11/06/2010 Current Smoker Alcohol use-rare Married x 3 retired - [place] [employer], occupational - still works part time 3 days per wk no biological children Drug use-no  Risk Factors: Smoking Status: current (12/04/2007) Packs/Day: 1 PPD (06/05/2007)  Social History: Current Smoker Alcohol use-rare 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         all otherwise negative per pt -  has known RIH mild for which he has been holding off on seeing surgeon  Physical Exam  General:  alert and well-developed.   Head:  normocephalic and atraumatic.   Eyes:  vision grossly intact, pupils equal, and pupils round.   Ears:  R ear normal and L ear normal.   Nose:  no external deformity and no nasal discharge.   Mouth:  no gingival abnormalities and pharynx pink and moist.   Neck:  supple and no masses.   Lungs:  normal respiratory effort and normal breath sounds.   Heart:  normal rate and regular rhythm.   Abdomen:  soft, non-tender, and normal bowel sounds.   Genitalia:  small reducible RIH noted, nontender Msk:  no joint tenderness and no joint swelling.  except for mild tender to right subacromail area with impingement sign on abduction Extremities:  no edema, no erythema  Neurologic:  cranial nerves II-XII intact and strength normal in all extremities.   Skin:  color normal, no rashes, and no suspicious lesions though has numerous bening appearing lesions to the back for many yrs Psych:  not anxious appearing and not depressed appearing.     Impression & Recommendations:  Problem # 1:  Preventive Health Care (ICD-V70.0) Overall doing well, age appropriate education and counseling updated, referral for preventive services and immunizations addressed, dietary counseling and smoking status adressed , most recent labs reviewed, ecg reviewed I have personally reviewed and have noted 1.The patient's medical and social history 2.Their use of alcohol, tobacco or illicit  drugs 3.Their current medications and supplements 4. Functional ability including ADL's, fall risk, home safety risk, hearing & visual impairment  5.Diet and physical activities 6.Evidence for depression or mood disorders The patients weight, height, BMI  have been recorded in the chart I have made referrals, counseling and provided education to the patient based review of the above  Orders: EKG w/ Interpretation (93000)  Problem # 2:  DIABETES MELLITUS, TYPE II (ICD-250.00)  His updated medication list for this problem includes:    Benazepril Hcl 40 Mg Tabs (Benazepril hcl) .[name]... 1 by mouth once daily Pt to cont DM diet, excercise, wt control efforts; to check labs today   Orders: TLB-A1C / Hgb A1C (Glycohemoglobin) (83036-A1C)  Labs Reviewed: Creat: 1.3 (11/02/2010)    Reviewed HgBA1c results: 6.5 (07/15/2009)  6.5 (05/28/2008)  Problem # 3:  INGUINAL HERNIA, RIGHT, SMALL (ICD-550.90) asympt - pt declines surgury appt for now, to call when he wants to have repaired  Problem # 4:  BURSITIS, RIGHT SHOULDER (ICD-726.10) mild, pt declines further tx or referral to ortho at this time  Problem #  5:  HYPERTENSION (ICD-401.9)  His updated medication list for this problem includes:    Benazepril Hcl 40 Mg Tabs (Benazepril hcl) .[name]... 1 by mouth once daily    Terazosin Hcl 2 Mg Caps (Terazosin hcl) .[name]... 1po at bedtime  BP today: 162/70 Prior BP: 152/86 (07/21/2009)  Labs Reviewed: K+: 4.4 (11/02/2010) Creat: : 1.3 (11/02/2010)   Chol: 232 (11/02/2010)   HDL: 34.50 (11/02/2010)   LDL: DEL (09/02/2008)   TG: 412.0 (11/02/2010) stable overall by hx and exam, ok to continue meds/tx as is  - see HPI  Problem # 6:  HYPERLIPIDEMIA (ICD-272.4)  d/w pt - declines further meds for LDL or TG today - to cont better diet  Labs Reviewed: SGOT: 20 (11/02/2010)   SGPT: 22 (11/02/2010)   HDL:34.50 (11/02/2010), 32.10 (07/15/2009)  LDL:DEL (09/02/2008), DEL (07/15/2008)  Chol:232  (11/02/2010), 212 (07/15/2009)  Trig:412.0 (11/02/2010), 260.0 (07/15/2009)  Complete Medication List: 1)  Benazepril Hcl 40 Mg Tabs (Benazepril hcl) .[name].. 1 by mouth once daily 2)  Terazosin Hcl 2 Mg Caps (Terazosin hcl) .[name].. 1po at bedtime 3)  Levitra 20 Mg Tabs (Vardenafil hcl) .[name].. 1 by mouth every other day as needed 4)  Bayer Contour Test Strp (Glucose blood) .... Test once daily  Other Orders: Flu Vaccine 84yrs + (16109) Admin 1st Vaccine (60454)  Patient Instructions: 1)  Please go to the Lab in the basement for your blood and/or urine tests today - the A1c 2)  Please call for general surgury referral when you want to have the right inguinal hernia repaired, and call if your right shoulder bursitis is worse for the orthopedic referral 3)  you had the flu shot today 4)  Continue all previous medications as before this visit  5)  Please schedule a follow-up appointment in 1 year or sooner if needed, for CPX with labs Prescriptions: LEVITRA 20 MG TABS (VARDENAFIL HCL) 1 by mouth every other day as needed  #5 x 11   Entered and Authorized by:   Corwin Levins MD   Signed by:   Corwin Levins MD on 11/06/2010   Method used:   Print then Give to Patient   RxID:   [id number] TERAZOSIN HCL 2 MG  CAPS (TERAZOSIN HCL) 1po at bedtime  #90 x 3   Entered and Authorized by:   Corwin Levins MD   Signed by:   Corwin Levins MD on 11/06/2010   Method used:   Print then Give to Patient   RxID:   [id number] BENAZEPRIL HCL 40 MG  TABS (BENAZEPRIL HCL) 1 by mouth once daily  #90 x 3   Entered and Authorized by:   Corwin Levins MD   Signed by:   Corwin Levins MD on 11/06/2010   Method used:   Print then Give to Patient   RxID:   [id number]    Orders Added: 1)  EKG w/ Interpretation [93000] 2)  Flu Vaccine 32yrs + [44034] 3)  Admin 1st Vaccine [90471] 4)  TLB-A1C / Hgb A1C (Glycohemoglobin) [83036-A1C] 5)  Est. Patient 65& > [74259]   Immunizations Administered:  Influenza  Vaccine # 1:    Vaccine Type: Fluvax 3+    Site: left deltoid    Mfr: Sanofi Pasteur    Dose: 0.5 ml    Route: IM    Given by: Zella Ball Ewing CMA (AAMA)    Exp. Date: 05/14/2011    Lot #: [id number]    VIS given: 05/09/10 version given  November 06, 2010.  Flu Vaccine Consent Questions:    Do you have a history of severe allergic reactions to this vaccine? no    Any prior history of allergic reactions to egg and/or gelatin? no    Do you have a sensitivity to the preservative Thimersol? no    Do you have a past history of Guillan-Barre Syndrome? no    Do you currently have an acute febrile illness? no    Have you ever had a severe reaction to latex? no    Vaccine information given and explained to patient? yes   Immunizations Administered:  Influenza Vaccine # 1:    Vaccine Type: Fluvax 3+    Site: left deltoid    Mfr: Sanofi Pasteur    Dose: 0.5 ml    Route: IM    Given by: Zella Ball Ewing CMA (AAMA)    Exp. Date: 05/14/2011    Lot #: [id number]    VIS given: 05/09/10 version given November 06, 2010.

## 2011-01-16 LAB — DIFFERENTIAL
Basophils Absolute: 0 10*3/uL (ref 0.0–0.1)
Basophils Relative: 0 % (ref 0–1)
Eosinophils Absolute: 0 10*3/uL (ref 0.0–0.7)
Eosinophils Relative: 0 % (ref 0–5)
Lymphocytes Relative: 15 % (ref 12–46)
Lymphs Abs: 1 10*3/uL (ref 0.7–4.0)
Monocytes Absolute: 0.4 10*3/uL (ref 0.1–1.0)
Monocytes Relative: 6 % (ref 3–12)
Neutro Abs: 5.3 10*3/uL (ref 1.7–7.7)
Neutrophils Relative %: 78 % — ABNORMAL HIGH (ref 43–77)

## 2011-01-16 LAB — URINALYSIS, ROUTINE W REFLEX MICROSCOPIC
Bilirubin Urine: NEGATIVE
Glucose, UA: NEGATIVE mg/dL
Hgb urine dipstick: NEGATIVE
Ketones, ur: NEGATIVE mg/dL
Nitrite: NEGATIVE
Protein, ur: NEGATIVE mg/dL
Specific Gravity, Urine: 1.013 (ref 1.005–1.030)
Urobilinogen, UA: 1 mg/dL (ref 0.0–1.0)
pH: 7 (ref 5.0–8.0)

## 2011-01-16 LAB — CBC
HCT: 47 % (ref 39.0–52.0)
Hemoglobin: 15.9 g/dL (ref 13.0–17.0)
MCHC: 33.8 g/dL (ref 30.0–36.0)
MCV: 85.8 fL (ref 78.0–100.0)
Platelets: 176 10*3/uL (ref 150–400)
RBC: 5.48 MIL/uL (ref 4.22–5.81)
RDW: 14.3 % (ref 11.5–15.5)
WBC: 6.8 10*3/uL (ref 4.0–10.5)

## 2011-01-16 LAB — COMPREHENSIVE METABOLIC PANEL
ALT: 21 U/L (ref 0–53)
AST: 20 U/L (ref 0–37)
Albumin: 3.7 g/dL (ref 3.5–5.2)
Alkaline Phosphatase: 63 U/L (ref 39–117)
BUN: 9 mg/dL (ref 6–23)
CO2: 25 mEq/L (ref 19–32)
Calcium: 9.3 mg/dL (ref 8.4–10.5)
Chloride: 107 mEq/L (ref 96–112)
Creatinine, Ser: 1.36 mg/dL (ref 0.4–1.5)
GFR calc non Af Amer: 52 mL/min — ABNORMAL LOW (ref >60)
Glucose, Bld: 146 mg/dL — ABNORMAL HIGH (ref 70–99)
Potassium: 4.1 mEq/L (ref 3.5–5.1)
Sodium: 139 mEq/L (ref 135–145)
Total Bilirubin: 0.5 mg/dL (ref 0.3–1.2)
Total Protein: 7 g/dL (ref 6.0–8.3)

## 2011-01-16 LAB — LIPASE, BLOOD: Lipase: 98 U/L — ABNORMAL HIGH (ref 11–59)

## 2011-03-02 NOTE — Op Note (Signed)
NAME:  Neil [name], Neil [name] NO.:  [id number]   MEDICAL RECORD NO.:  [id number]                   PATIENT TYPE:   LOCATION:                                       FACILITY:   PHYSICIAN:  Anselmo Rod, M.D.               DATE OF BIRTH:  04/30/2001   DATE OF PROCEDURE:  08/28/2002  DATE OF DISCHARGE:                                 OPERATIVE REPORT   PROCEDURE PERFORMED:  Colonoscopy with polypectomy times 12 and cold  biopsies times eight.   ENDOSCOPIST:  Anselmo Rod, M.D.   INSTRUMENT USED:  Pediatric adjustable Olympus colonoscope.   INDICATIONS FOR PROCEDURE:  The patient is a 70 year-old white male with a  history of adenomatous polyps, recent guaiac positive stools and family  history of colon cancer in his mother who had recurrent polyps, masses, etc.   PREPROCEDURE PREPARATION:  Informed consent was procured from the patient.  The patient fasted for eight hours prior to the procedure and prepped with a  bottle of magnesium citrate and a gallon of NuLytely the night prior to the  procedure.   PREPROCEDURE PHYSICAL:  VITAL SIGNS: Stable vital signs.  NECK:  Supple.  CHEST:  Clear to auscultation.  CARDIAC:  S1 and S2.  ABDOMEN:  Soft with normal bowel sounds.   DESCRIPTION OF PROCEDURE:  The patient was placed in the left lateral  decubitus position  and sedated with 100 mg of Demerol and 7.5 mg of Versed  intravenously.  Once the patient was adequate sedated and maintained on low  flow oxygen and continuous cardiac monitoring, the Olympus video colonoscope  was advanced from the rectum to the cecum without difficulty.  The patient  had a fairly good prep.  Three small sessile polyps were snared from the  right colon distal to the cecum.  Another larger sessile polyp was removed  at 90 cm and placed in a separate bottle, in package bottle #2.  A patchy  area of erythema was biopsied from the rectosigmoid to rule out colitis.  Multiple  small sessile polyps were removed by both cold snare polypectomy  and cold biopsies from the rectum and anal verge, respectively.  A few early  left sided diverticula were seen.   IMPRESSION:  1. Multiple colonic polyps, see description above.  2. Patchy erythema seen in the rectosigmoid area, biopsies done.  3. Few left sided diverticula.  4. Prominent external hemorrhoids on anal inspection and small internal     hemorrhoids on retroflexion.    RECOMMENDATIONS:  1. Await pathology results.  2. Avoid all non-steroidals including aspirin for the next four weeks.  3. Outpatient follow-up in the next two weeks for further recommendations.  Anselmo Rod, M.D.    JNM/MEDQ  D:  08/28/2002  T:  08/28/2002  Job:  [id number]   cc:   DAVID STOUT, M.D.

## 2011-03-02 NOTE — Procedures (Signed)
[place]  Patient:    Neil [name], Neil [name]                       MRN: [id number] Proc. Date: 02/06/00 Adm. Date:  [id number] Attending:  Charna Elizabeth CC:         Dr. Meliton Rattan at [facility] on [facility]                           Procedure Report  DATE OF BIRTH:  05/16/1941  REFERRING PHYSICIAN:  Dr. Meliton Rattan.  PROCEDURE PERFORMED:  Colonoscopy with snare polypectomy x 6.  ENDOSCOPIST:  Anselmo Rod, M.D.  INSTRUMENT USED:  Olympus video colonoscope.  INDICATIONS FOR PROCEDURE:  Blood in stool and a family history of colon cancer in a 70 year old white male, rule out masses, polyps, hemorrhoids, etc.  PREPROCEDURE PREPARATION:  Informed consent was procured from the patient.  The  patient was fasted for eight hours prior to the procedure, and prepped with a bottle of magnesium citrate and a gallon of NuLytely the night prior to the procedure.  PREPROCEDURE PHYSICAL:  VITAL SIGNS:  Stable.  NECK:  Supple.  CHEST:  Clear to auscultation, S1 and S2 regular.  ABDOMEN:  Soft with normal abdominal bowel sounds.  DESCRIPTION OF PROCEDURE:  The patient was placed in the left lateral decubitus  position and sedated with 75 mg of Demerol of 7 mg of Versed intravenously. Once the patient was adequately sedated, maintained on low flow oxygen and continuous cardiac monitoring, the Olympus video colonoscope was advanced from the rectum o the cecum with slight difficulty secondary to some residual stool in the colon.  The patient has prominent external hemorrhoids and small internal hemorrhoids. A flat polyp was removed from the rectum at 5 cm by snare polypectomy.  There was no evidence of bleeding from the polypectomy site.  ______ polyps were snared from  70-80 cm by snare polypectomy forceps.  There was evidence of early left-sided diverticular disease.  One flat polyp was snared from the cecum.  There was  a large amount of stool in the dependent areas of the colon, but a very small lesion may have been missed.  No large masses were seen.  The procedure was completed up to the cecum.  The appendiceal orifice and the ileocecal valve was clearly identified.  IMPRESSION: 1. External and internal hemorrhoids. 2. Small flat polyp that was snared at 5 cm. 3. Four flat polyps, snared at 70-80 cm. 4. Left-sided diverticulosis. 5. One flat polyp removed from the cecum. 6. Some residual stool in the colon.  RECOMMENDATIONS: 1. Await pathology results. 2. No nonsteroidals for the next 14 days. 3. Soft diet for now. 4. Outpatient follow up in the next two weeks. DD:  02/06/00 TD:  02/06/00 Job: [id number]

## 2011-03-02 NOTE — Op Note (Signed)
Neil [name], Neil [name]                          ACCOUNT NO.:  [id number]   MEDICAL RECORD NO.:  [id number]                   PATIENT TYPE:  AMB   LOCATION:  ENDO                                 FACILITY:  [facility]   PHYSICIAN:  Anselmo Rod, M.D.               DATE OF BIRTH:  07-31-1941   DATE OF PROCEDURE:  05/10/2003  DATE OF DISCHARGE:                                 OPERATIVE REPORT   PROCEDURE PERFORMED:  Colonoscopy with snare polypectomies x12 and cold  biopsies x13.   ENDOSCOPIST:  Anselmo Rod, M.D.   INSTRUMENT USED:  Olympus video colonoscope.   INDICATION FOR PROCEDURE:  A 70 year old white male with a history of  multiple adenomatous polyps and tubular adenoma removed on colonoscopy done  in November 2003, undergoing a repeat colonoscopy to rule out recurrent  polyps.   PREPROCEDURE PREPARATION:  Informed consent was procured from the patient.  The patient had fasted for eight hours prior to the procedure and prepped  with a bottle of magnesium citrate and a gallon of GoLYTELY the night prior  to the procedure.   PREPROCEDURE PHYSICAL:  VITAL SIGNS:  The patient had stable vital signs.  NECK:  Supple.  CHEST:  Clear to auscultation.  S1, S2 regular.  ABDOMEN:  Soft with normal bowel sounds.   DESCRIPTION OF PROCEDURE:  The patient was placed in the left lateral  decubitus position and sedated with 70 mg of Demerol and 7 mg of Versed  intravenously.  Once the patient was adequately sedate and maintained on low-  flow oxygen and continuous cardiac monitoring, the Olympus video colonoscope  was advanced from the rectum to the cecum.  The appendiceal orifice and  ileocecal valve were clearly visualized and photographed.  A large polyp was  snared from the proximal right colon.  Another polyp was snared from 40 cm.  These were placed in two separate bottles.  Multiple small polyps snared and  cold biopsied from the rectum and the rectosigmoid area.  These totalled  25  polyps in all.  Several small polyps were seen in the rectal area but were  not biopsied and seemed hyperplastic in nature.  There were six to seven  small sessile polyps that were ablated with the tip of the snare in the  rectum.  Retroflexion in the rectum revealed moderate-sized nonbleeding  internal hemorrhoids.  There were scattered diverticula throughout the  colon.  There was erythema noted around one of the diverticula in the left  colon.  The reason for this is unclear.  The patient tolerated the procedure  well without complications.   IMPRESSION:  1. Multiple colonic polyps removed (see description above).  2. Scattered diverticulosis.  3. Moderate-sized nonbleeding internal hemorrhoids.  4. Small area of erythema around a diverticulum in the left colon.  The     reason for this is unclear.   RECOMMENDATIONS:  1. Await pathology results.  2. Hold off on Voltaren for the next two to three weeks and try Tylenol     Arthritis instead.  3. Avoid all other nonsteroidals, including aspirin.  4. Repeat colonoscopy depending on pathology report.  5. A high-fiber diet with liberal fluid intake.  6. Outpatient follow-up as the need arises in the future.                                                  Anselmo Rod, M.D.    JNM/MEDQ  D:  05/10/2003  T:  05/10/2003  Job:  [id number]   cc:   Gabriel Earing, M.D.  [address]  [place]  [place] [address]  Fax: [phone]

## 2011-10-02 ENCOUNTER — Telehealth: Payer: Self-pay

## 2011-10-02 DIAGNOSIS — Z1289 Encounter for screening for malignant neoplasm of other sites: Secondary | ICD-10-CM

## 2011-10-02 DIAGNOSIS — Z0000 Encounter for general adult medical examination without abnormal findings: Secondary | ICD-10-CM

## 2011-10-02 NOTE — Telephone Encounter (Signed)
Put order in for physical labs. 

## 2011-10-04 ENCOUNTER — Telehealth: Payer: Self-pay

## 2011-10-04 NOTE — Telephone Encounter (Signed)
Added hgba1c to physical labs.

## 2011-10-29 ENCOUNTER — Encounter: Payer: Self-pay | Admitting: Internal Medicine

## 2011-11-05 ENCOUNTER — Other Ambulatory Visit (INDEPENDENT_AMBULATORY_CARE_PROVIDER_SITE_OTHER): Payer: Medicare Other

## 2011-11-05 DIAGNOSIS — Z0000 Encounter for general adult medical examination without abnormal findings: Secondary | ICD-10-CM

## 2011-11-05 DIAGNOSIS — E119 Type 2 diabetes mellitus without complications: Secondary | ICD-10-CM

## 2011-11-05 DIAGNOSIS — E781 Pure hyperglyceridemia: Secondary | ICD-10-CM

## 2011-11-05 DIAGNOSIS — Z1289 Encounter for screening for malignant neoplasm of other sites: Secondary | ICD-10-CM

## 2011-11-05 DIAGNOSIS — R7309 Other abnormal glucose: Secondary | ICD-10-CM

## 2011-11-05 DIAGNOSIS — Z125 Encounter for screening for malignant neoplasm of prostate: Secondary | ICD-10-CM

## 2011-11-05 DIAGNOSIS — I10 Essential (primary) hypertension: Secondary | ICD-10-CM

## 2011-11-05 LAB — URINALYSIS, ROUTINE W REFLEX MICROSCOPIC
Bilirubin Urine: NEGATIVE
Hgb urine dipstick: NEGATIVE
Ketones, ur: NEGATIVE
Leukocytes, UA: NEGATIVE
Nitrite: NEGATIVE
Specific Gravity, Urine: 1.02 (ref 1.000–1.030)
Total Protein, Urine: NEGATIVE
Urine Glucose: NEGATIVE
Urobilinogen, UA: 0.2 (ref 0.0–1.0)
WBC, UA: NONE SEEN (ref 0–?)
pH: 6 (ref 5.0–8.0)

## 2011-11-05 LAB — CBC WITH DIFFERENTIAL/PLATELET
Basophils Absolute: 0.1 10*3/uL (ref 0.0–0.1)
Basophils Relative: 1.2 % (ref 0.0–3.0)
Eosinophils Absolute: 0.1 10*3/uL (ref 0.0–0.7)
Eosinophils Relative: 1 % (ref 0.0–5.0)
HCT: 48.2 % (ref 39.0–52.0)
Hemoglobin: 16.5 g/dL (ref 13.0–17.0)
Lymphocytes Relative: 27.4 % (ref 12.0–46.0)
Lymphs Abs: 2 10*3/uL (ref 0.7–4.0)
MCHC: 34.3 g/dL (ref 30.0–36.0)
MCV: 87.9 fl (ref 78.0–100.0)
Monocytes Absolute: 0.8 10*3/uL (ref 0.1–1.0)
Monocytes Relative: 11.4 % (ref 3.0–12.0)
Neutro Abs: 4.2 10*3/uL (ref 1.4–7.7)
Neutrophils Relative %: 59 % (ref 43.0–77.0)
Platelets: 181 10*3/uL (ref 150.0–400.0)
RBC: 5.48 Mil/uL (ref 4.22–5.81)
RDW: 13.7 % (ref 11.5–14.6)
WBC: 7.2 10*3/uL (ref 4.5–10.5)

## 2011-11-05 LAB — BASIC METABOLIC PANEL
BUN: 15 mg/dL (ref 6–23)
CO2: 28 mEq/L (ref 19–32)
Calcium: 9.1 mg/dL (ref 8.4–10.5)
Chloride: 105 mEq/L (ref 96–112)
Creatinine, Ser: 1.3 mg/dL (ref 0.4–1.5)
GFR: 60.22 mL/min (ref >60.00)
Glucose, Bld: 127 mg/dL — ABNORMAL HIGH (ref 70–99)
Potassium: 4.4 mEq/L (ref 3.5–5.1)
Sodium: 141 mEq/L (ref 135–145)

## 2011-11-05 LAB — HEPATIC FUNCTION PANEL
ALT: 20 U/L (ref 0–53)
AST: 17 U/L (ref 0–37)
Albumin: 4.1 g/dL (ref 3.5–5.2)
Alkaline Phosphatase: 55 U/L (ref 39–117)
Bilirubin, Direct: 0.1 mg/dL (ref 0.0–0.3)
Total Bilirubin: 0.7 mg/dL (ref 0.3–1.2)
Total Protein: 7.1 g/dL (ref 6.0–8.3)

## 2011-11-05 LAB — LIPID PANEL
Cholesterol: 253 mg/dL — ABNORMAL HIGH (ref 0–200)
HDL: 36 mg/dL — ABNORMAL LOW (ref >39.00)
Total CHOL/HDL Ratio: 7
Triglycerides: 544 mg/dL — ABNORMAL HIGH (ref 0.0–149.0)
VLDL: 108.8 mg/dL — ABNORMAL HIGH (ref 0.0–40.0)

## 2011-11-05 LAB — LDL CHOLESTEROL, DIRECT: Direct LDL: 108.1 mg/dL

## 2011-11-05 LAB — PSA: PSA: 2.38 ng/mL (ref 0.10–4.00)

## 2011-11-05 LAB — TSH: TSH: 0.68 u[IU]/mL (ref 0.35–5.50)

## 2011-11-05 LAB — HEMOGLOBIN A1C: Hgb A1c MFr Bld: 6.5 % (ref 4.6–6.5)

## 2011-11-11 ENCOUNTER — Encounter: Payer: Self-pay | Admitting: Internal Medicine

## 2011-11-11 DIAGNOSIS — Z0001 Encounter for general adult medical examination with abnormal findings: Secondary | ICD-10-CM | POA: Insufficient documentation

## 2011-11-11 DIAGNOSIS — Z0000 Encounter for general adult medical examination without abnormal findings: Secondary | ICD-10-CM | POA: Insufficient documentation

## 2011-11-14 ENCOUNTER — Ambulatory Visit (INDEPENDENT_AMBULATORY_CARE_PROVIDER_SITE_OTHER): Payer: Medicare Other | Admitting: Internal Medicine

## 2011-11-14 ENCOUNTER — Encounter: Payer: Self-pay | Admitting: Internal Medicine

## 2011-11-14 VITALS — BP 144/80 | HR 85 | Temp 98.7°F | Ht 69.0 in | Wt 179.4 lb

## 2011-11-14 DIAGNOSIS — I10 Essential (primary) hypertension: Secondary | ICD-10-CM

## 2011-11-14 DIAGNOSIS — E781 Pure hyperglyceridemia: Secondary | ICD-10-CM | POA: Insufficient documentation

## 2011-11-14 DIAGNOSIS — J209 Acute bronchitis, unspecified: Secondary | ICD-10-CM

## 2011-11-14 DIAGNOSIS — Z0000 Encounter for general adult medical examination without abnormal findings: Secondary | ICD-10-CM

## 2011-11-14 DIAGNOSIS — E119 Type 2 diabetes mellitus without complications: Secondary | ICD-10-CM

## 2011-11-14 MED ORDER — BENAZEPRIL HCL 40 MG PO TABS: 40.0000 mg | ORAL_TABLET | Freq: Every day | ORAL | Status: DC

## 2011-11-14 MED ORDER — HYDROCODONE-HOMATROPINE 5-1.5 MG/5ML PO SYRP: 5.0000 mL | ORAL_SOLUTION | Freq: Four times a day (QID) | ORAL | Status: AC | PRN

## 2011-11-14 MED ORDER — TERAZOSIN HCL 2 MG PO CAPS: 2.0000 mg | ORAL_CAPSULE | Freq: Every day | ORAL | Status: DC

## 2011-11-14 MED ORDER — AZITHROMYCIN 250 MG PO TABS: ORAL_TABLET | ORAL | Status: AC

## 2011-11-14 MED ORDER — PNEUMOCOCCAL VAC POLYVALENT 25 MCG/0.5ML IJ INJ: 0.5000 mL | INJECTION | Freq: Once | INTRAMUSCULAR | Status: DC

## 2011-11-14 NOTE — Assessment & Plan Note (Signed)
Quite severe, delcines fenofibrate, to focus on wt loss and low fat diet, f/u lab in 4 months

## 2011-11-14 NOTE — Progress Notes (Signed)
Subjective:    Patient ID: Neil [name], male    DOB: 07/19/1941, 71 y.o.   MRN: [id number]  HPI  Here for wellness and f/u;  Overall doing ok;  Pt denies CP, worsening SOB, DOE, wheezing, orthopnea, PND, worsening LE edema, palpitations, dizziness or syncope.  Pt denies neurological change such as new Headache, facial or extremity weakness.  Pt denies polydipsia, polyuria, or low sugar symptoms. Pt states overall good compliance with treatment and medications, good tolerability, and trying to follow lower cholesterol diet.  Pt denies worsening depressive symptoms, suicidal ideation or panic. No fever, wt loss, night sweats, loss of appetite, or other constitutional symptoms.  Pt states good ability with ADL's, low fall risk, home safety reviewed and adequate, no significant changes in hearing or vision, and occasionally active with exercise, has been somewhat nonaderent to lower fat diet recently, especaily over the holidays.  Incidentally also - with 1 wk onset  HA, general weakness and malaise, with prod cough greenish sputum.  No sick contacts, now wife is starting cough yesterday as well.  Does have sense of ongoing fatigue, but denies signficant hypersomnolence, occurs with the acute illness only.  Out of BP med for a few days/ Past Medical History  Diagnosis Date  . ALLERGIC RHINITIS 12/04/2007  . BENIGN PROSTATIC HYPERTROPHY 06/05/2007  . BUNDLE BRANCH BLOCK, RIGHT 07/21/2009  . BURSITIS, RIGHT SHOULDER 11/06/2010  . COLONIC POLYPS, HX OF 06/05/2007  . DIABETES MELLITUS, TYPE II 12/04/2007  . DIVERTICULOSIS, COLON 07/21/2009  . ERECTILE DYSFUNCTION 06/05/2007  . HYPERLIPIDEMIA 06/05/2007  . HYPERTENSION 06/05/2007  . INGUINAL HERNIA, RIGHT, SMALL 11/06/2010  . SKIN LESION 06/03/2008   History reviewed. No pertinent past surgical history.  reports that he has been smoking.  He has never used smokeless tobacco. He reports that he drinks alcohol. He reports that he does not use illicit drugs. family  history includes Hypertension in his father. Allergies  Allergen Reactions  . Ezetimibe   . Statins     REACTION: muscle aches   No current outpatient prescriptions on file prior to visit.   No current facility-administered medications on file prior to visit.   No current outpatient prescriptions on file prior to visit.   No current facility-administered medications on file prior to visit.   Review of Systems Review of Systems  Constitutional: Negative for diaphoresis, activity change, appetite change and unexpected weight change.  HENT: Negative for hearing loss, ear pain, facial swelling, mouth sores and neck stiffness.   Eyes: Negative for pain, redness and visual disturbance.  Respiratory: Negative for shortness of breath and wheezing.   Cardiovascular: Negative for chest pain and palpitations.  Gastrointestinal: Negative for diarrhea, blood in stool, abdominal distention and rectal pain.  Genitourinary: Negative for hematuria, flank pain and decreased urine volume.  Musculoskeletal: Negative for myalgias and joint swelling.  Skin: Negative for color change and wound.  Neurological: Negative for syncope and numbness.  Hematological: Negative for adenopathy.  Psychiatric/Behavioral: Negative for hallucinations, self-injury, decreased concentration and agitation.      Objective:   Physical Exam BP 144/80  Pulse 85  Temp(Src) 98.7 F (37.1 C) (Oral)  Ht 5\' 9"  (1.753 m)  Wt 179 lb 6 oz (81.364 kg)  BMI 26.49 kg/m2  SpO2 94% Physical Exam  VS noted, mild ill Constitutional: Pt is oriented to person, place, and time. Appears well-developed and well-nourished.  HENT:  Head: Normocephalic and atraumatic.  Right Ear: External ear normal.  Left Ear: External ear normal.  Nose: Nose normal.  Mouth/Throat: Oropharynx is clear and moist. Bilat tm's mild erythema.  Sinus nontender.  Pharynx mild erythema  Eyes: Conjunctivae and EOM are normal. Pupils are equal, round, and  reactive to light.  Neck: Normal range of motion. Neck supple. No JVD present. No tracheal deviation present.  Cardiovascular: Normal rate, regular rhythm, normal heart sounds and intact distal pulses.   Pulmonary/Chest: Effort normal and breath sounds normal.  Abdominal: Soft. Bowel sounds are normal. There is no tenderness.  Musculoskeletal: Normal range of motion. Exhibits no edema.  Lymphadenopathy:  Has no cervical adenopathy.  Neurological: Pt is alert and oriented to person, place, and time. Pt has normal reflexes. No cranial nerve deficit.  Skin: Skin is warm and dry. No rash noted.  Psychiatric:  Has  normal mood and affect. Behavior is normal. 1+ nervous    Assessment & Plan:

## 2011-11-14 NOTE — Assessment & Plan Note (Signed)
stable overall by hx and exam, most recent data reviewed with pt, and pt to continue medical treatment as before Lab Results  Component Value Date   HGBA1C 6.5 11/05/2011

## 2011-11-14 NOTE — Assessment & Plan Note (Signed)
Mild elev today, states OK at home, declines furhter med tx, for lower salt diet, Continue all other medications as before,  to f/u any worsening symptoms or concerns BP Readings from Last 3 Encounters:  11/14/11 144/80  11/06/10 162/70  07/21/09 152/86

## 2011-11-14 NOTE — Assessment & Plan Note (Addendum)
Overall doing well, age appropriate education and counseling updated, referrals for preventative services and immunizations addressed, dietary and smoking counseling addressed, most recent labs and ECG reviewed.  I have personally reviewed and have noted: 1) the patient's medical and social history 2) The pt's use of alcohol, tobacco, and illicit drugs 3) The patient's current medications and supplements 4) Functional ability including ADL's, fall risk, home safety risk, hearing and visual impairment 5) Diet and physical activities 6) Evidence for depression or mood disorder 7) The patient's height, weight, and BMI have been recorded in the chart I have made referrals, and provided counseling and education based on review of the above ECG reviewed as per emr, for pneumovax today

## 2011-11-14 NOTE — Patient Instructions (Addendum)
Your EKG was good today Please follow lower fat, lower cholesterol diet, and rerturn in 4 months for LAB only (lipids) Otherwise your blood work was good today Take all new medications as prescribed - the antibiotic and cough medicine Please call the phone number [phone] (the PhoneTree System) for results of your blood testing in 2-3 days after the lab draw;  When calling, simply dial the number, and when prompted enter the MRN number above (the Medical Record Number) and the # key, then the message should start. Your medications were refilled today as requested You had the pneumonia shot today Please call for the colonscopy using the phone number on the letter you have already received at home Please return in 1 year for your yearly visit, or sooner if needed, with Lab testing done 3-5 days before

## 2011-11-14 NOTE — Assessment & Plan Note (Signed)
Mild to mod, for antibx course,  to f/u any worsening symptoms or concerns 

## 2012-03-25 ENCOUNTER — Telehealth: Payer: Self-pay | Admitting: Internal Medicine

## 2012-03-25 ENCOUNTER — Other Ambulatory Visit (INDEPENDENT_AMBULATORY_CARE_PROVIDER_SITE_OTHER): Payer: Medicare Other

## 2012-03-25 DIAGNOSIS — E781 Pure hyperglyceridemia: Secondary | ICD-10-CM

## 2012-03-25 LAB — LIPID PANEL
Cholesterol: 223 mg/dL — ABNORMAL HIGH (ref 0–200)
HDL: 35.9 mg/dL — ABNORMAL LOW (ref >39.00)
Total CHOL/HDL Ratio: 6
Triglycerides: 608 mg/dL — ABNORMAL HIGH (ref 0.0–149.0)
VLDL: 121.6 mg/dL — ABNORMAL HIGH (ref 0.0–40.0)

## 2012-03-25 LAB — LDL CHOLESTEROL, DIRECT: Direct LDL: 93.4 mg/dL

## 2012-03-25 MED ORDER — FENOFIBRATE 160 MG PO TABS: 160.0000 mg | ORAL_TABLET | Freq: Every day | ORAL | Status: DC

## 2012-03-25 NOTE — Telephone Encounter (Signed)
Robin to contact pt -   F/u lipids from last visit in dec 2012 show no improvement with the TG - in fact some worse;  Needs to start fenofibrate 160 qd  Done per emr  Ok to send results to pt if he wants

## 2012-03-26 NOTE — Telephone Encounter (Signed)
Called informed patient of result and new medication to start.  Did send result per pt. Request in the mail

## 2012-07-04 ENCOUNTER — Ambulatory Visit (INDEPENDENT_AMBULATORY_CARE_PROVIDER_SITE_OTHER): Payer: Medicare Other

## 2012-07-04 DIAGNOSIS — Z23 Encounter for immunization: Secondary | ICD-10-CM

## 2012-07-28 ENCOUNTER — Encounter: Payer: Self-pay | Admitting: Internal Medicine

## 2012-10-15 HISTORY — PX: INGUINAL HERNIA REPAIR: SUR1180

## 2012-10-31 ENCOUNTER — Emergency Department (HOSPITAL_COMMUNITY): Payer: Medicare Other

## 2012-10-31 ENCOUNTER — Emergency Department (HOSPITAL_COMMUNITY)
Admission: EM | Admit: 2012-10-31 | Discharge: 2012-10-31 | Disposition: A | Discharge status: Home / Self Care | Payer: Medicare Other | Attending: Emergency Medicine | Admitting: Emergency Medicine

## 2012-10-31 ENCOUNTER — Encounter (HOSPITAL_COMMUNITY): Payer: Self-pay | Admitting: Emergency Medicine

## 2012-10-31 DIAGNOSIS — E119 Type 2 diabetes mellitus without complications: Secondary | ICD-10-CM | POA: Insufficient documentation

## 2012-10-31 DIAGNOSIS — Z860100 Personal history of colon polyps, unspecified: Secondary | ICD-10-CM | POA: Insufficient documentation

## 2012-10-31 DIAGNOSIS — I10 Essential (primary) hypertension: Secondary | ICD-10-CM | POA: Insufficient documentation

## 2012-10-31 DIAGNOSIS — Z79899 Other long term (current) drug therapy: Secondary | ICD-10-CM | POA: Insufficient documentation

## 2012-10-31 DIAGNOSIS — Z8719 Personal history of other diseases of the digestive system: Secondary | ICD-10-CM | POA: Insufficient documentation

## 2012-10-31 DIAGNOSIS — R109 Unspecified abdominal pain: Secondary | ICD-10-CM | POA: Insufficient documentation

## 2012-10-31 DIAGNOSIS — Z8739 Personal history of other diseases of the musculoskeletal system and connective tissue: Secondary | ICD-10-CM | POA: Insufficient documentation

## 2012-10-31 DIAGNOSIS — Z87448 Personal history of other diseases of urinary system: Secondary | ICD-10-CM | POA: Insufficient documentation

## 2012-10-31 DIAGNOSIS — Z872 Personal history of diseases of the skin and subcutaneous tissue: Secondary | ICD-10-CM | POA: Insufficient documentation

## 2012-10-31 DIAGNOSIS — K4090 Unilateral inguinal hernia, without obstruction or gangrene, not specified as recurrent: Secondary | ICD-10-CM

## 2012-10-31 DIAGNOSIS — F17200 Nicotine dependence, unspecified, uncomplicated: Secondary | ICD-10-CM | POA: Insufficient documentation

## 2012-10-31 DIAGNOSIS — I4510 Unspecified right bundle-branch block: Secondary | ICD-10-CM | POA: Insufficient documentation

## 2012-10-31 DIAGNOSIS — E785 Hyperlipidemia, unspecified: Secondary | ICD-10-CM | POA: Insufficient documentation

## 2012-10-31 DIAGNOSIS — K4091 Unilateral inguinal hernia, without obstruction or gangrene, recurrent: Secondary | ICD-10-CM | POA: Insufficient documentation

## 2012-10-31 DIAGNOSIS — N400 Enlarged prostate without lower urinary tract symptoms: Secondary | ICD-10-CM | POA: Insufficient documentation

## 2012-10-31 LAB — CBC WITH DIFFERENTIAL/PLATELET
Basophils Absolute: 0 10*3/uL (ref 0.0–0.1)
Basophils Relative: 0 % (ref 0–1)
Eosinophils Absolute: 0 10*3/uL (ref 0.0–0.7)
Eosinophils Relative: 0 % (ref 0–5)
HCT: 47.6 % (ref 39.0–52.0)
Hemoglobin: 16.3 g/dL (ref 13.0–17.0)
Lymphocytes Relative: 9 % — ABNORMAL LOW (ref 12–46)
Lymphs Abs: 0.8 10*3/uL (ref 0.7–4.0)
MCH: 29.8 pg (ref 26.0–34.0)
MCHC: 34.2 g/dL (ref 30.0–36.0)
MCV: 87 fL (ref 78.0–100.0)
Monocytes Absolute: 0.3 10*3/uL (ref 0.1–1.0)
Monocytes Relative: 4 % (ref 3–12)
Neutro Abs: 7.8 10*3/uL — ABNORMAL HIGH (ref 1.7–7.7)
Neutrophils Relative %: 87 % — ABNORMAL HIGH (ref 43–77)
Platelets: 264 10*3/uL (ref 150–400)
RBC: 5.47 MIL/uL (ref 4.22–5.81)
RDW: 13.2 % (ref 11.5–15.5)
WBC: 9 10*3/uL (ref 4.0–10.5)

## 2012-10-31 LAB — BASIC METABOLIC PANEL
BUN: 16 mg/dL (ref 6–23)
CO2: 23 mEq/L (ref 19–32)
Calcium: 10.1 mg/dL (ref 8.4–10.5)
Chloride: 100 mEq/L (ref 96–112)
Creatinine, Ser: 1.23 mg/dL (ref 0.50–1.35)
GFR calc Af Amer: 67 mL/min — ABNORMAL LOW (ref >90)
GFR calc non Af Amer: 58 mL/min — ABNORMAL LOW (ref >90)
Glucose, Bld: 122 mg/dL — ABNORMAL HIGH (ref 70–99)
Potassium: 4.7 mEq/L (ref 3.5–5.1)
Sodium: 137 mEq/L (ref 135–145)

## 2012-10-31 LAB — URINALYSIS, ROUTINE W REFLEX MICROSCOPIC
Bilirubin Urine: NEGATIVE
Glucose, UA: NEGATIVE mg/dL
Hgb urine dipstick: NEGATIVE
Ketones, ur: NEGATIVE mg/dL
Leukocytes, UA: NEGATIVE
Nitrite: NEGATIVE
Protein, ur: 30 mg/dL — AB
Specific Gravity, Urine: 1.02 (ref 1.005–1.030)
Urobilinogen, UA: 1 mg/dL (ref 0.0–1.0)
pH: 6.5 (ref 5.0–8.0)

## 2012-10-31 LAB — URINE MICROSCOPIC-ADD ON

## 2012-10-31 MED ORDER — HYDROMORPHONE HCL PF 1 MG/ML IJ SOLN
1.0000 mg | Freq: Once | INTRAMUSCULAR | Status: AC
  Administered 2012-10-31: 1 mg via INTRAVENOUS
  Filled 2012-10-31: qty 1

## 2012-10-31 MED ORDER — PROMETHAZINE HCL 25 MG PO TABS: 25.0000 mg | ORAL_TABLET | Freq: Four times a day (QID) | ORAL | Status: DC | PRN

## 2012-10-31 MED ORDER — SODIUM CHLORIDE 0.9 % IV SOLN
INTRAVENOUS | Status: DC
  Administered 2012-10-31: 18:00:00 via INTRAVENOUS

## 2012-10-31 MED ORDER — HYDROMORPHONE HCL PF 1 MG/ML IJ SOLN
0.5000 mg | Freq: Once | INTRAMUSCULAR | Status: AC
  Administered 2012-10-31: 0.5 mg via INTRAVENOUS
  Filled 2012-10-31: qty 1

## 2012-10-31 MED ORDER — ONDANSETRON HCL 4 MG/2ML IJ SOLN
4.0000 mg | Freq: Once | INTRAMUSCULAR | Status: AC
  Administered 2012-10-31: 4 mg via INTRAVENOUS
  Filled 2012-10-31: qty 2

## 2012-10-31 MED ORDER — SODIUM CHLORIDE 0.9 % IV BOLUS (SEPSIS)
1000.0000 mL | Freq: Once | INTRAVENOUS | Status: AC
  Administered 2012-10-31: 1000 mL via INTRAVENOUS

## 2012-10-31 MED ORDER — OXYCODONE-ACETAMINOPHEN 5-325 MG PO TABS: 2.0000 | ORAL_TABLET | ORAL | Status: DC | PRN

## 2012-10-31 NOTE — Discharge Instructions (Signed)
Inguinal Hernia, Adult Muscles help keep everything in the body in its proper place. But if a weak spot in the muscles develops, something can poke through. That is called a hernia. When this happens in the lower part of the belly (abdomen), it is called an inguinal hernia. (It takes its name from a part of the body in this region called the inguinal canal.) A weak spot in the wall of muscles lets some fat or part of the small intestine bulge through. An inguinal hernia can develop at any age. Men get them more often than women. CAUSES  In adults, an inguinal hernia develops over time.  It can be triggered by:  Suddenly straining the muscles of the lower abdomen.  Lifting heavy objects.  Straining to have a bowel movement. Difficult bowel movements (constipation) can lead to this.  Constant coughing. This may be caused by smoking or lung disease.  Being overweight.  Being pregnant.  Working at a job that requires long periods of standing or heavy lifting.  Having had an inguinal hernia before. One type can be an emergency situation. It is called a strangulated inguinal hernia. It develops if part of the small intestine slips through the weak spot and cannot get back into the abdomen. The blood supply can be cut off. If that happens, part of the intestine may die. This situation requires emergency surgery. SYMPTOMS  Often, a small inguinal hernia has no symptoms. It is found when a healthcare provider does a physical exam. Larger hernias usually have symptoms.   In adults, symptoms may include:  A lump in the groin. This is easier to see when the person is standing. It might disappear when lying down.  In men, a lump in the scrotum.  Pain or burning in the groin. This occurs especially when lifting, straining or coughing.  A dull ache or feeling of pressure in the groin.  Signs of a strangulated hernia can include:  A bulge in the groin that becomes very painful and tender to the  touch.  A bulge that turns red or purple.  Fever, nausea and vomiting.  Inability to have a bowel movement or to pass gas. DIAGNOSIS  To decide if you have an inguinal hernia, a healthcare provider will probably do a physical examination.  This will include asking questions about any symptoms you have noticed.  The healthcare provider might feel the groin area and ask you to cough. If an inguinal hernia is felt, the healthcare provider may try to slide it back into the abdomen.  Usually no other tests are needed. TREATMENT  Treatments can vary. The size of the hernia makes a difference. Options include:  Watchful waiting. This is often suggested if the hernia is small and you have had no symptoms.  No medical procedure will be done unless symptoms develop.  You will need to watch closely for symptoms. If any occur, contact your healthcare provider right away.  Surgery. This is used if the hernia is larger or you have symptoms.  Open surgery. This is usually an outpatient procedure (you will not stay overnight in a hospital). An cut (incision) is made through the skin in the groin. The hernia is put back inside the abdomen. The weak area in the muscles is then repaired by herniorrhaphy or hernioplasty. Herniorrhaphy: in this type of surgery, the weak muscles are sewn back together. Hernioplasty: a patch or mesh is used to close the weak area in the abdominal wall.  Laparoscopy.  In this procedure, a surgeon makes small incisions. A thin tube with a tiny video camera (called a laparoscope) is put into the abdomen. The surgeon repairs the hernia with mesh by looking with the video camera and using two long instruments. HOME CARE INSTRUCTIONS   After surgery to repair an inguinal hernia:  You will need to take pain medicine prescribed by your healthcare provider. Follow all directions carefully.  You will need to take care of the wound from the incision.  Your activity will be  restricted for awhile. This will probably include no heavy lifting for several weeks. You also should not do anything too active for a few weeks. When you can return to work will depend on the type of job that you have.  During "watchful waiting" periods, you should:  Maintain a healthy weight.  Eat a diet high in fiber (fruits, vegetables and whole grains).  Drink plenty of fluids to avoid constipation. This means drinking enough water and other liquids to keep your urine clear or pale yellow.  Do not lift heavy objects.  Do not stand for long periods of time.  Quit smoking. This should keep you from developing a frequent cough. SEEK MEDICAL CARE IF:   A bulge develops in your groin area.  You feel pain, a burning sensation or pressure in the groin. This might be worse if you are lifting or straining.  You develop a fever of more than 100.5 F (38.1 C). SEEK IMMEDIATE MEDICAL CARE IF:   Pain in the groin increases suddenly.  A bulge in the groin gets bigger suddenly and does not go down.  For men, there is sudden pain in the scrotum. Or, the size of the scrotum increases.  A bulge in the groin area becomes red or purple and is painful to touch.  You have nausea or vomiting that does not go away.  You feel your heart beating much faster than normal.  You cannot have a bowel movement or pass gas.  You develop a fever of more than 102.0 F (38.9 C). Document Released: 02/17/2009 Document Revised: 12/24/2011 Document Reviewed: 02/17/2009 [facility] Patient Information 2013 [place], [place].   You have an inguinal hernia. Strongly recommend followup with general surgery. Phone number to local group given. Medication for pain and nausea. If hernia comes out again and you are unable to reduce it, return to the emergency department the

## 2012-10-31 NOTE — ED Provider Notes (Signed)
History     CSN: [id number]  Arrival date & time 10/31/12  1441   First MD Initiated Contact with Patient 10/31/12 1531      Chief Complaint  Patient presents with  . Groin Swelling  . Groin Pain    (Consider location/radiation/quality/duration/timing/severity/associated sxs/prior treatment) HPI... right inguinal hernia since this morning.  Hernia diagnosed 2 or 3 years ago. Patient generally lives with the hernia; however, the bulging got much worse today.  Able to eat. No vomiting or diarrhea or dysuria. Palpation makes symptoms worse. No radiation of pain.  Past Medical History  Diagnosis Date  . ALLERGIC RHINITIS 12/04/2007  . BENIGN PROSTATIC HYPERTROPHY 06/05/2007  . BUNDLE BRANCH BLOCK, RIGHT 07/21/2009  . BURSITIS, RIGHT SHOULDER 11/06/2010  . COLONIC POLYPS, HX OF 06/05/2007  . DIABETES MELLITUS, TYPE II 12/04/2007  . DIVERTICULOSIS, COLON 07/21/2009  . ERECTILE DYSFUNCTION 06/05/2007  . HYPERLIPIDEMIA 06/05/2007  . HYPERTENSION 06/05/2007  . INGUINAL HERNIA, RIGHT, SMALL 11/06/2010  . SKIN LESION 06/03/2008    History reviewed. No pertinent past surgical history.  Family History  Problem Relation Age of Onset  . Hypertension Father     History  Substance Use Topics  . Smoking status: Current Every Day Smoker  . Smokeless tobacco: Never Used  . Alcohol Use: Yes     Comment: rare      Review of Systems  All other systems reviewed and are negative.    Allergies  Ezetimibe and Statins  Home Medications   Current Outpatient Rx  Name  Route  Sig  Dispense  Refill  . ACETAMINOPHEN 500 MG PO TABS   Oral   Take 1,500 mg by mouth every 6 (six) hours as needed. For pain         . BENAZEPRIL HCL 40 MG PO TABS   Oral   Take 1 tablet (40 mg total) by mouth daily.   90 tablet   3   . DM-DOXYLAMINE-ACETAMINOPHEN 15-6.25-325 MG/15ML PO LIQD   Oral   Take 30 mLs by mouth at bedtime as needed. For cough and cold         . FENOFIBRATE 160 MG PO TABS    Oral   Take 1 tablet (160 mg total) by mouth daily.   90 tablet   3   . SODIUM CHLORIDE 0.65 % NA SOLN   Nasal   Place 1 spray into the nose at bedtime.         . TERAZOSIN HCL 2 MG PO CAPS   Oral   Take 1 capsule (2 mg total) by mouth at bedtime.   90 capsule   3   . OXYCODONE-ACETAMINOPHEN 5-325 MG PO TABS   Oral   Take 2 tablets by mouth every 4 (four) hours as needed for pain.   20 tablet   0   . PROMETHAZINE HCL 25 MG PO TABS   Oral   Take 1 tablet (25 mg total) by mouth every 6 (six) hours as needed for nausea.   20 tablet   0     BP 147/74  Pulse 98  Temp 98.8 F (37.1 C) (Oral)  Resp 18  SpO2 96%  Physical Exam  Nursing note and vitals reviewed. Constitutional: He is oriented to person, place, and time. He appears well-developed and well-nourished.  HENT:  Head: Normocephalic and atraumatic.  Eyes: Conjunctivae normal and EOM are normal. Pupils are equal, round, and reactive to light.  Neck: Normal range of motion. Neck supple.  Cardiovascular: Normal rate, regular rhythm and normal heart sounds.   Pulmonary/Chest: Effort normal and breath sounds normal.  Abdominal: Bowel sounds are normal.       Obvious right inguinal hernia with bulging into right scrotum  Musculoskeletal: Normal range of motion.  Neurological: He is alert and oriented to person, place, and time.  Skin: Skin is warm and dry.  Psychiatric: He has a normal mood and affect.    ED Course  Procedures (including critical care time)  Labs Reviewed  CBC WITH DIFFERENTIAL - Abnormal; Notable for the following:    Neutrophils Relative 87 (*)     Neutro Abs 7.8 (*)     Lymphocytes Relative 9 (*)     All other components within normal limits  BASIC METABOLIC PANEL - Abnormal; Notable for the following:    Glucose, Bld 122 (*)     GFR calc non Af Amer 58 (*)     GFR calc Af Amer 67 (*)     All other components within normal limits  URINALYSIS, ROUTINE W REFLEX MICROSCOPIC - Abnormal;  Notable for the following:    Protein, ur 30 (*)     All other components within normal limits  URINE MICROSCOPIC-ADD ON   Dg Abd Acute W/chest  10/31/2012  *RADIOLOGY REPORT*  Clinical Data: Right inguinal hernia  ACUTE ABDOMEN SERIES (ABDOMEN 2 VIEW & CHEST 1 VIEW)  Comparison: 09/28/2009  Findings: Cardiomediastinal silhouette is stable.  No acute infiltrate or pulmonary edema.  Multiple distended small bowel loops with air-fluid levels are noted in the right abdomen highly suspicious for a small bowel obstruction.  No free abdominal air.  IMPRESSION: No acute disease within chest.  Distended small bowel loops in the right abdomen with air-fluid levels highly suspicious for small bowel obstruction.  No free abdominal air.   Original Report Authenticated By: Natasha Mead, M.D.      1. Right inguinal hernia       MDM  Patient was able to reduce hernia with pressure after IV fluids and pain medicine. Discussed with general surgery. Will followup as outpatient. Discharge meds include Percocet #20 and Phenergan 25 mg #20        Donnetta Hutching, MD 10/31/12 2210

## 2012-10-31 NOTE — ED Notes (Addendum)
Reports left groin pain (normally can help relief with sitting down instead today pain got worse). 3-5months ago had an episode. Dx Inguinal hernia December 10th 2010. Wose pain scale 10/10. Patient stated he can normally self reduce the hernia however, unable today.

## 2012-10-31 NOTE — ED Notes (Signed)
[id number]<AV> Expected date:10/31/12<BR> Expected time: 2:10 PM<BR> Means of arrival:Ambulance<BR> Comments:<BR> abd pain

## 2012-11-01 ENCOUNTER — Encounter: Payer: Self-pay | Admitting: Internal Medicine

## 2012-11-01 ENCOUNTER — Telehealth: Payer: Self-pay | Admitting: Internal Medicine

## 2012-11-01 NOTE — Telephone Encounter (Signed)
Received mychart message:  I need my Benazepril & Terazosin refilled this week. I have scheduled an appointment (earliest available) for Feb 14th @ 3:45PM. These are 90 day prescriptions @ [facility] on [address]   Please authorize 90 day refills for these because of costs. Also please let me know if this is possible.   Thank You.         Robin to address please

## 2012-11-03 MED ORDER — TERAZOSIN HCL 2 MG PO CAPS: 2.0000 mg | ORAL_CAPSULE | Freq: Every day | ORAL | Status: DC

## 2012-11-03 MED ORDER — BENAZEPRIL HCL 40 MG PO TABS: 40.0000 mg | ORAL_TABLET | Freq: Every day | ORAL | Status: DC

## 2012-11-20 ENCOUNTER — Other Ambulatory Visit (INDEPENDENT_AMBULATORY_CARE_PROVIDER_SITE_OTHER): Payer: Medicare Other

## 2012-11-20 DIAGNOSIS — Z0000 Encounter for general adult medical examination without abnormal findings: Secondary | ICD-10-CM

## 2012-11-20 DIAGNOSIS — E119 Type 2 diabetes mellitus without complications: Secondary | ICD-10-CM

## 2012-11-20 DIAGNOSIS — N400 Enlarged prostate without lower urinary tract symptoms: Secondary | ICD-10-CM

## 2012-11-20 DIAGNOSIS — I10 Essential (primary) hypertension: Secondary | ICD-10-CM

## 2012-11-20 DIAGNOSIS — E785 Hyperlipidemia, unspecified: Secondary | ICD-10-CM

## 2012-11-20 LAB — BASIC METABOLIC PANEL
BUN: 17 mg/dL (ref 6–23)
CO2: 27 mEq/L (ref 19–32)
Calcium: 9.2 mg/dL (ref 8.4–10.5)
Chloride: 103 mEq/L (ref 96–112)
Creatinine, Ser: 1.3 mg/dL (ref 0.4–1.5)
GFR: 60.59 mL/min (ref >60.00)
Glucose, Bld: 200 mg/dL — ABNORMAL HIGH (ref 70–99)
Potassium: 4.5 mEq/L (ref 3.5–5.1)
Sodium: 137 mEq/L (ref 135–145)

## 2012-11-20 LAB — CBC WITH DIFFERENTIAL/PLATELET
Basophils Absolute: 0 10*3/uL (ref 0.0–0.1)
Basophils Relative: 0.7 % (ref 0.0–3.0)
Eosinophils Absolute: 0.1 10*3/uL (ref 0.0–0.7)
Eosinophils Relative: 2.1 % (ref 0.0–5.0)
HCT: 43.9 % (ref 39.0–52.0)
Hemoglobin: 14.7 g/dL (ref 13.0–17.0)
Lymphocytes Relative: 26.6 % (ref 12.0–46.0)
Lymphs Abs: 1.7 10*3/uL (ref 0.7–4.0)
MCHC: 33.5 g/dL (ref 30.0–36.0)
MCV: 87.7 fl (ref 78.0–100.0)
Monocytes Absolute: 1 10*3/uL (ref 0.1–1.0)
Monocytes Relative: 15.3 % — ABNORMAL HIGH (ref 3.0–12.0)
Neutro Abs: 3.6 10*3/uL (ref 1.4–7.7)
Neutrophils Relative %: 55.3 % (ref 43.0–77.0)
Platelets: 218 10*3/uL (ref 150.0–400.0)
RBC: 5.01 Mil/uL (ref 4.22–5.81)
RDW: 13.9 % (ref 11.5–14.6)
WBC: 6.4 10*3/uL (ref 4.5–10.5)

## 2012-11-20 LAB — MICROALBUMIN / CREATININE URINE RATIO
Creatinine,U: 139.1 mg/dL
Microalb Creat Ratio: 1.4 mg/g (ref 0.0–30.0)
Microalb, Ur: 2 mg/dL — ABNORMAL HIGH (ref 0.0–1.9)

## 2012-11-20 LAB — URINALYSIS, ROUTINE W REFLEX MICROSCOPIC
Bilirubin Urine: NEGATIVE
Hgb urine dipstick: NEGATIVE
Ketones, ur: NEGATIVE
Leukocytes, UA: NEGATIVE
Nitrite: NEGATIVE
Specific Gravity, Urine: 1.02 (ref 1.000–1.030)
Total Protein, Urine: NEGATIVE
Urine Glucose: 100
Urobilinogen, UA: 0.2 (ref 0.0–1.0)
pH: 6 (ref 5.0–8.0)

## 2012-11-20 LAB — LIPID PANEL
Cholesterol: 158 mg/dL (ref 0–200)
HDL: 34.5 mg/dL — ABNORMAL LOW (ref >39.00)
LDL Cholesterol: 87 mg/dL (ref 0–99)
Total CHOL/HDL Ratio: 5
Triglycerides: 182 mg/dL — ABNORMAL HIGH (ref 0.0–149.0)
VLDL: 36.4 mg/dL (ref 0.0–40.0)

## 2012-11-20 LAB — HEPATIC FUNCTION PANEL
ALT: 17 U/L (ref 0–53)
AST: 19 U/L (ref 0–37)
Albumin: 4 g/dL (ref 3.5–5.2)
Alkaline Phosphatase: 33 U/L — ABNORMAL LOW (ref 39–117)
Bilirubin, Direct: 0.2 mg/dL (ref 0.0–0.3)
Total Bilirubin: 0.8 mg/dL (ref 0.3–1.2)
Total Protein: 7.1 g/dL (ref 6.0–8.3)

## 2012-11-20 LAB — HEMOGLOBIN A1C: Hgb A1c MFr Bld: 6.6 % — ABNORMAL HIGH (ref 4.6–6.5)

## 2012-11-20 LAB — TSH: TSH: 0.56 u[IU]/mL (ref 0.35–5.50)

## 2012-11-20 LAB — PSA: PSA: 1.98 ng/mL (ref 0.10–4.00)

## 2012-11-28 ENCOUNTER — Encounter: Payer: Medicare Other | Admitting: Internal Medicine

## 2012-12-12 ENCOUNTER — Ambulatory Visit (INDEPENDENT_AMBULATORY_CARE_PROVIDER_SITE_OTHER): Payer: Medicare Other | Admitting: Internal Medicine

## 2012-12-12 ENCOUNTER — Encounter: Payer: Self-pay | Admitting: Internal Medicine

## 2012-12-12 VITALS — BP 138/84 | HR 69 | Temp 98.4°F | Ht 69.0 in | Wt 175.2 lb

## 2012-12-12 DIAGNOSIS — I10 Essential (primary) hypertension: Secondary | ICD-10-CM

## 2012-12-12 DIAGNOSIS — L989 Disorder of the skin and subcutaneous tissue, unspecified: Secondary | ICD-10-CM | POA: Insufficient documentation

## 2012-12-12 DIAGNOSIS — Z0000 Encounter for general adult medical examination without abnormal findings: Secondary | ICD-10-CM

## 2012-12-12 DIAGNOSIS — Z86010 Personal history of colonic polyps: Secondary | ICD-10-CM

## 2012-12-12 DIAGNOSIS — E119 Type 2 diabetes mellitus without complications: Secondary | ICD-10-CM

## 2012-12-12 NOTE — Assessment & Plan Note (Signed)
stable overall by history and exam, recent data reviewed with pt, and pt to continue medical treatment as before,  to f/u any worsening symptoms or concerns BP Readings from Last 3 Encounters:  12/12/12 138/84  10/31/12 154/76  11/14/11 144/80

## 2012-12-12 NOTE — Assessment & Plan Note (Signed)
stable overall by history and exam, recent data reviewed with pt, and pt to continue medical treatment as before,  to f/u any worsening symptoms or concerns Lab Results  Component Value Date   HGBA1C 6.6* 11/20/2012    

## 2012-12-12 NOTE — Patient Instructions (Addendum)
Your EKG was OK today Please call for colonoscopy Please check with your insurance about the shingles shot You will be contacted regarding the referral for: dermatology Thank you for enrolling in MyChart. Please follow the instructions below to securely access your online medical record. MyChart allows you to send messages to your doctor, view your test results, renew your prescriptions, schedule appointments, and more. Please return in 6 months, or sooner if needed, with Lab testing done 3-5 days before

## 2012-12-12 NOTE — Assessment & Plan Note (Signed)
AND mother with hx colon ca;  He has been hesitant for colonoscopy, but accepts now, will order

## 2012-12-12 NOTE — Assessment & Plan Note (Signed)
Dark raised lesion right mid back and several other lesions as well, for derm referral

## 2012-12-12 NOTE — Progress Notes (Signed)
Subjective:    Patient ID: Neil [name], male    DOB: [birth year], 72 y.o.   MRN: [id number]  HPI  Here for wellness and f/u;  Overall doing ok;  Pt denies CP, worsening SOB, DOE, wheezing, orthopnea, PND, worsening LE edema, palpitations, dizziness or syncope.  Pt denies neurological change such as new headache, facial or extremity weakness.  Pt denies polydipsia, polyuria, or low sugar symptoms. Pt states overall good compliance with treatment and medications, good tolerability, and has been trying to follow lower cholesterol diet.  Pt denies worsening depressive symptoms, suicidal ideation or panic. No fever, night sweats, unintended wt loss, loss of appetite, or other constitutional symptoms.  Pt states good ability with ADL's, has low fall risk, home safety reviewed and adequate, no other significant changes in hearing or vision, and only occasionally active with exercise.  No acute complaints. Has lost 5 lbs with better diet.   Past Medical History  Diagnosis Date  . ALLERGIC RHINITIS 12/04/2007  . BENIGN PROSTATIC HYPERTROPHY 06/05/2007  . BUNDLE BRANCH BLOCK, RIGHT 07/21/2009  . BURSITIS, RIGHT SHOULDER 11/06/2010  . COLONIC POLYPS, HX OF 06/05/2007  . DIABETES MELLITUS, TYPE II 12/04/2007  . DIVERTICULOSIS, COLON 07/21/2009  . ERECTILE DYSFUNCTION 06/05/2007  . HYPERLIPIDEMIA 06/05/2007  . HYPERTENSION 06/05/2007  . INGUINAL HERNIA, RIGHT, SMALL 11/06/2010  . SKIN LESION 06/03/2008   No past surgical history on file.  reports that he has been smoking.  He has never used smokeless tobacco. He reports that  drinks alcohol. He reports that he does not use illicit drugs. family history includes Hypertension in his father. Allergies  Allergen Reactions  . Ezetimibe   . Statins     REACTION: muscle aches   Current Outpatient Prescriptions on File Prior to Visit  Medication Sig Dispense Refill  . acetaminophen (TYLENOL) 500 MG tablet Take 1,500 mg by mouth every 6 (six) hours as needed. For  pain      . benazepril (LOTENSIN) 40 MG tablet Take 1 tablet (40 mg total) by mouth daily.  90 tablet  3  . DM-Doxylamine-Acetaminophen (VICKS NYQUIL COLD & FLU NIGHT) 15-6.25-325 MG/15ML LIQD Take 30 mLs by mouth at bedtime as needed. For cough and cold      . fenofibrate 160 MG tablet Take 1 tablet (160 mg total) by mouth daily.  90 tablet  3  . sodium chloride (OCEAN) 0.65 % nasal spray Place 1 spray into the nose at bedtime.      [name] terazosin (HYTRIN) 2 MG capsule Take 1 capsule (2 mg total) by mouth at bedtime.  90 capsule  3   No current facility-administered medications on file prior to visit.   Review of Systems Constitutional: Negative for diaphoresis, activity change, appetite change or unexpected weight change.  HENT: Negative for hearing loss, ear pain, facial swelling, mouth sores and neck stiffness.   Eyes: Negative for pain, redness and visual disturbance.  Respiratory: Negative for shortness of breath and wheezing.   Cardiovascular: Negative for chest pain and palpitations.  Gastrointestinal: Negative for diarrhea, blood in stool, abdominal distention or other pain Genitourinary: Negative for hematuria, flank pain or change in urine volume.  Musculoskeletal: Negative for myalgias and joint swelling.  Skin: Negative for color change and wound.  Neurological: Negative for syncope and numbness. other than noted Hematological: Negative for adenopathy.  Psychiatric/Behavioral: Negative for hallucinations, self-injury, decreased concentration and agitation.      Objective:   Physical Exam BP 138/84  Pulse 69  Temp(Src)  98.4 F (36.9 C) (Oral)  Ht 5\' 9"  (1.753 m)  Wt 175 lb 4 oz (79.493 kg)  BMI 25.87 kg/m2  SpO2 98% VS noted,  Constitutional: Pt is oriented to person, place, and time. Appears well-developed and well-nourished.  Head: Normocephalic and atraumatic.  Right Ear: External ear normal.  Left Ear: External ear normal.  Nose: Nose normal.  Mouth/Throat:  Oropharynx is clear and moist.  Eyes: Conjunctivae and EOM are normal. Pupils are equal, round, and reactive to light.  Neck: Normal range of motion. Neck supple. No JVD present. No tracheal deviation present.  Cardiovascular: Normal rate, regular rhythm, normal heart sounds and intact distal pulses.   Pulmonary/Chest: Effort normal and breath sounds normal.  Abdominal: Soft. Bowel sounds are normal. There is no tenderness. No HSM  Musculoskeletal: Normal range of motion. Exhibits no edema.  Lymphadenopathy:  Has no cervical adenopathy.  Neurological: Pt is alert and oriented to person, place, and time. Pt has normal reflexes. No cranial nerve deficit.  Skin: Skin is warm and dry. No rash noted. Has mult dark lesions to back, one black, 12 mm, raised right mid back but not irregular, two toned Psychiatric:  Has  normal mood and affect. Behavior is normal.     Assessment & Plan:

## 2012-12-12 NOTE — Assessment & Plan Note (Signed)

## 2012-12-15 ENCOUNTER — Encounter: Payer: Self-pay | Admitting: Internal Medicine

## 2013-02-04 ENCOUNTER — Telehealth: Payer: Self-pay | Admitting: Internal Medicine

## 2013-02-04 NOTE — Telephone Encounter (Signed)
rec'd from [facility] forward 2 pages to Dr.John 02/04/13

## 2013-02-05 ENCOUNTER — Ambulatory Visit (AMBULATORY_SURGERY_CENTER): Payer: Medicare Other | Admitting: *Deleted

## 2013-02-05 VITALS — Ht 69.0 in | Wt 176.8 lb

## 2013-02-05 DIAGNOSIS — Z86010 Personal history of colonic polyps: Secondary | ICD-10-CM

## 2013-02-05 DIAGNOSIS — Z1211 Encounter for screening for malignant neoplasm of colon: Secondary | ICD-10-CM

## 2013-02-05 DIAGNOSIS — Z800 Family history of malignant neoplasm of digestive organs: Secondary | ICD-10-CM

## 2013-02-05 MED ORDER — MOVIPREP 100 G PO SOLR: 1.0000 | Freq: Once | ORAL | Status: DC

## 2013-02-05 NOTE — Progress Notes (Signed)
No egg or soy allergy. ewm  No problems with sedation in the past. ewm 

## 2013-02-06 ENCOUNTER — Encounter: Payer: Self-pay | Admitting: Internal Medicine

## 2013-02-19 ENCOUNTER — Encounter: Payer: Self-pay | Admitting: Internal Medicine

## 2013-02-19 ENCOUNTER — Ambulatory Visit (AMBULATORY_SURGERY_CENTER): Payer: Medicare Other | Admitting: Internal Medicine

## 2013-02-19 VITALS — BP 156/63 | HR 54 | Temp 96.5°F | Resp 17 | Ht 69.0 in | Wt 176.0 lb

## 2013-02-19 DIAGNOSIS — D126 Benign neoplasm of colon, unspecified: Secondary | ICD-10-CM

## 2013-02-19 DIAGNOSIS — Z1211 Encounter for screening for malignant neoplasm of colon: Secondary | ICD-10-CM

## 2013-02-19 DIAGNOSIS — Z860100 Personal history of colon polyps, unspecified: Secondary | ICD-10-CM

## 2013-02-19 MED ORDER — SODIUM CHLORIDE 0.9 % IV SOLN: 500.0000 mL | INTRAVENOUS | Status: DC

## 2013-02-19 NOTE — Patient Instructions (Addendum)
YOU HAD AN ENDOSCOPIC PROCEDURE TODAY AT THE [facility]: Refer to the procedure report that was given to you for any specific questions about what was found during the examination.  If the procedure report does not answer your questions, please call your gastroenterologist to clarify.  If you requested that your care partner not be given the details of your procedure findings, then the procedure report has been included in a sealed envelope for you to review at your convenience later.  YOU SHOULD EXPECT: Some feelings of bloating in the abdomen. Passage of more gas than usual.  Walking can help get rid of the air that was put into your GI tract during the procedure and reduce the bloating. If you had a lower endoscopy (such as a colonoscopy or flexible sigmoidoscopy) you may notice spotting of blood in your stool or on the toilet paper. If you underwent a bowel prep for your procedure, then you may not have a normal bowel movement for a few days.  DIET: Your first meal following the procedure should be a light meal and then it is ok to progress to your normal diet.  A half-sandwich or bowl of soup is an example of a good first meal.  Heavy or fried foods are harder to digest and may make you feel nauseous or bloated.  Likewise meals heavy in dairy and vegetables can cause extra gas to form and this can also increase the bloating.  Drink plenty of fluids but you should avoid alcoholic beverages for 24 hours.  ACTIVITY: Your care partner should take you home directly after the procedure.  You should plan to take it easy, moving slowly for the rest of the day.  You can resume normal activity the day after the procedure however you should NOT DRIVE or use heavy machinery for 24 hours (because of the sedation medicines used during the test).    SYMPTOMS TO REPORT IMMEDIATELY: A gastroenterologist can be reached at any hour.  During normal business hours, 8:30 AM to 5:00 PM Monday through Friday,  call [phone].  After hours and on weekends, please call the GI answering service at [phone] who will take a message and have the physician on call contact you.   Following lower endoscopy (colonoscopy or flexible sigmoidoscopy):  Excessive amounts of blood in the stool  Significant tenderness or worsening of abdominal pains  Swelling of the abdomen that is new, acute  Fever of 100F or higher    FOLLOW UP: If any biopsies were taken you will be contacted by phone or by letter within the next 1-3 weeks.  Call your gastroenterologist if you have not heard about the biopsies in 3 weeks.  Our staff will call the home number listed on your records the next business day following your procedure to check on you and address any questions or concerns that you may have at that time regarding the information given to you following your procedure. This is a courtesy call and so if there is no answer at the home number and we have not heard from you through the emergency physician on call, we will assume that you have returned to your regular daily activities without incident.  SIGNATURES/CONFIDENTIALITY: You and/or your care partner have signed paperwork which will be entered into your electronic medical record.  These signatures attest to the fact that that the information above on your After Visit Summary has been reviewed and is understood.  Full responsibility of the confidentiality   of this discharge information lies with you and/or your care-partner.  Polyp, diverticulosis, high fiber diet information given.  Repeat colonoscopy in 3 years.

## 2013-02-19 NOTE — Op Note (Signed)
[facility] [address] [place], [address]   COLONOSCOPY PROCEDURE REPORT  PATIENT: Neil [name]  MR#: [id number] BIRTHDATE: 03/01/1941 , 71  yrs. old GENDER: Male ENDOSCOPIST: Roxy Cedar, MD REFERRED NU:[id number] Program Recall PROCEDURE DATE:  02/19/2013 PROCEDURE:   Colonoscopy with snare polypectomy    x 3 ASA CLASS:   Class II INDICATIONS:Patient's immediate family history of colon cancer (parent 66) and Patient's personal history of adenomatous colon polyps. Elsewhere (Dr. Loreta Ave) and here 11-2008 with MULTIPLE AND ADVANCED MEDICATIONS: MAC sedation, administered by CRNA and propofol (Diprivan) 250mg  IV  DESCRIPTION OF PROCEDURE:   After the risks benefits and alternatives of the procedure were thoroughly explained, informed consent was obtained.  A digital rectal exam revealed external hemorrhoids.   The LB [id number]  endoscope was introduced through the anus and advanced to the cecum, which was identified by both the appendix and ileocecal valve. No adverse events experienced.   The quality of the prep was excellent, using MoviPrep  The instrument was then slowly withdrawn as the colon was fully examined.      COLON FINDINGS: Three polyps ranging between 3-50mm in size were found in the ascending (2) and descending colon.  A polypectomy was performed with a cold snare.  The resection was complete and the polyp tissue was completely retrieved.   Moderate diverticulosis was noted The finding was in the left colon.   The colon mucosa was otherwise normal.  Retroflexed views revealed internal hemorrhoids. The time to cecum=2 minutes 23 seconds.  Withdrawal time=13 minutes 50 seconds.  The scope was withdrawn and the procedure completed. COMPLICATIONS: There were no complications.  ENDOSCOPIC IMPRESSION: 1.   Three polyps ranging between 3-66mm in size were found in the ascending colon and descending colon; polypectomy was  performed with a cold snare 2.   Moderate diverticulosis was noted in the left colon 3.   The colon mucosa was otherwise normal  RECOMMENDATIONS: 1. Repeat Colonoscopy in 3 years.   eSigned:  Roxy Cedar, MD 02/19/2013 8:37 AM cc: The Patient and Corwin Levins, MD   PATIENT NAME:  Neil [name], Neil [name] MR#: [id number]

## 2013-02-19 NOTE — Progress Notes (Signed)
Patient did not experience any of the following events: a burn prior to discharge; a fall within the facility; wrong site/side/patient/procedure/implant event; or a hospital transfer or hospital admission upon discharge from the facility. (G8907) Patient did not have preoperative order for IV antibiotic SSI prophylaxis. (G8918)  

## 2013-02-20 ENCOUNTER — Telehealth: Payer: Self-pay | Admitting: *Deleted

## 2013-02-20 NOTE — Telephone Encounter (Signed)
  Follow up Call-  Call back number 02/19/2013  Post procedure Call Back phone  # [phone]  Permission to leave phone message Yes     Patient questions:  Do you have a fever, pain , or abdominal swelling? yes Pain Score  2 *  Have you tolerated food without any problems? yes  Have you been able to return to your normal activities? yes  Do you have any questions about your discharge instructions: Diet   yes Medications  no Follow up visit  no  Do you have questions or concerns about your Care? no  Actions: * If pain score is 4 or above: No action needed, pain <4.

## 2013-02-24 ENCOUNTER — Encounter: Payer: Self-pay | Admitting: Internal Medicine

## 2013-03-19 ENCOUNTER — Other Ambulatory Visit: Payer: Self-pay | Admitting: Internal Medicine

## 2013-04-21 ENCOUNTER — Ambulatory Visit (INDEPENDENT_AMBULATORY_CARE_PROVIDER_SITE_OTHER): Payer: Self-pay | Admitting: General Surgery

## 2013-05-08 ENCOUNTER — Encounter (INDEPENDENT_AMBULATORY_CARE_PROVIDER_SITE_OTHER): Payer: Self-pay | Admitting: General Surgery

## 2013-05-08 ENCOUNTER — Ambulatory Visit (INDEPENDENT_AMBULATORY_CARE_PROVIDER_SITE_OTHER): Payer: Medicare Other | Admitting: General Surgery

## 2013-05-08 VITALS — BP 170/90 | HR 80 | Temp 97.8°F | Resp 15 | Ht 69.0 in | Wt 171.8 lb

## 2013-05-08 DIAGNOSIS — K4090 Unilateral inguinal hernia, without obstruction or gangrene, not specified as recurrent: Secondary | ICD-10-CM

## 2013-05-08 NOTE — Patient Instructions (Signed)
You have a very large right inguinal hernia that extends all the way down into the scrotum.  With some effort, we can completely reduce this, so you are in no immediate danger.  However, because of your repeated episodes of pain requiring manual reduction, you are at  increased risk for strangulation which is much more dangerous.  You are advised to schedule elective repair of the right inguinal hernia with mesh.  We have discussed this in detail. Please call Dr. Jacinto Halim office back when you're ready to schedule the surgery.  After this type of surgery, you will be able to drive a car in one week. You will not be able to lift anything more than 20 pounds for 4 weeks. After 4 weeks there will be no restrictions.          Inguinal Hernia, Adult Muscles help keep everything in the body in its proper place. But if a weak spot in the muscles develops, something can poke through. That is called a hernia. When this happens in the lower part of the belly (abdomen), it is called an inguinal hernia. (It takes its name from a part of the body in this region called the inguinal canal.) A weak spot in the wall of muscles lets some fat or part of the small intestine bulge through. An inguinal hernia can develop at any age. Men get them more often than women. CAUSES  In adults, an inguinal hernia develops over time.  It can be triggered by:  Suddenly straining the muscles of the lower abdomen.  Lifting heavy objects.  Straining to have a bowel movement. Difficult bowel movements (constipation) can lead to this.  Constant coughing. This may be caused by smoking or lung disease.  Being overweight.  Being pregnant.  Working at a job that requires long periods of standing or heavy lifting.  Having had an inguinal hernia before. One type can be an emergency situation. It is called a strangulated inguinal hernia. It develops if part of the small intestine slips through the weak spot and cannot  get back into the abdomen. The blood supply can be cut off. If that happens, part of the intestine may die. This situation requires emergency surgery. SYMPTOMS  Often, a small inguinal hernia has no symptoms. It is found when a healthcare provider does a physical exam. Larger hernias usually have symptoms.   In adults, symptoms may include:  A lump in the groin. This is easier to see when the person is standing. It might disappear when lying down.  In men, a lump in the scrotum.  Pain or burning in the groin. This occurs especially when lifting, straining or coughing.  A dull ache or feeling of pressure in the groin.  Signs of a strangulated hernia can include:  A bulge in the groin that becomes very painful and tender to the touch.  A bulge that turns red or purple.  Fever, nausea and vomiting.  Inability to have a bowel movement or to pass gas. DIAGNOSIS  To decide if you have an inguinal hernia, a healthcare provider will probably do a physical examination.  This will include asking questions about any symptoms you have noticed.  The healthcare provider might feel the groin area and ask you to cough. If an inguinal hernia is felt, the healthcare provider may try to slide it back into the abdomen.  Usually no other tests are needed. TREATMENT  Treatments can vary. The size of the hernia makes a difference.  Options include:  Watchful waiting. This is often suggested if the hernia is small and you have had no symptoms.  No medical procedure will be done unless symptoms develop.  You will need to watch closely for symptoms. If any occur, contact your healthcare provider right away.  Surgery. This is used if the hernia is larger or you have symptoms.  Open surgery. This is usually an outpatient procedure (you will not stay overnight in a hospital). An cut (incision) is made through the skin in the groin. The hernia is put back inside the abdomen. The weak area in the muscles  is then repaired by herniorrhaphy or hernioplasty. Herniorrhaphy: in this type of surgery, the weak muscles are sewn back together. Hernioplasty: a patch or mesh is used to close the weak area in the abdominal wall.  Laparoscopy. In this procedure, a surgeon makes small incisions. A thin tube with a tiny video camera (called a laparoscope) is put into the abdomen. The surgeon repairs the hernia with mesh by looking with the video camera and using two long instruments. HOME CARE INSTRUCTIONS   After surgery to repair an inguinal hernia:  You will need to take pain medicine prescribed by your healthcare provider. Follow all directions carefully.  You will need to take care of the wound from the incision.  Your activity will be restricted for awhile. This will probably include no heavy lifting for several weeks. You also should not do anything too active for a few weeks. When you can return to work will depend on the type of job that you have.  During "watchful waiting" periods, you should:  Maintain a healthy weight.  Eat a diet high in fiber (fruits, vegetables and whole grains).  Drink plenty of fluids to avoid constipation. This means drinking enough water and other liquids to keep your urine clear or pale yellow.  Do not lift heavy objects.  Do not stand for long periods of time.  Quit smoking. This should keep you from developing a frequent cough. SEEK MEDICAL CARE IF:   A bulge develops in your groin area.  You feel pain, a burning sensation or pressure in the groin. This might be worse if you are lifting or straining.  You develop a fever of more than 100.5 F (38.1 C). SEEK IMMEDIATE MEDICAL CARE IF:   Pain in the groin increases suddenly.  A bulge in the groin gets bigger suddenly and does not go down.  For men, there is sudden pain in the scrotum. Or, the size of the scrotum increases.  A bulge in the groin area becomes red or purple and is painful to touch.  You  have nausea or vomiting that does not go away.  You feel your heart beating much faster than normal.  You cannot have a bowel movement or pass gas.  You develop a fever of more than 102.0 F (38.9 C). Document Released: 02/17/2009 Document Revised: 12/24/2011 Document Reviewed: 02/17/2009 [facility] Patient Information 2014 [place], [place].    Inguinal Hernia, Adult  Care After Refer to this sheet in the next few weeks. These discharge instructions provide you with general information on caring for yourself after you leave the hospital. Your caregiver may also give you specific instructions. Your treatment has been planned according to the most current medical practices available, but unavoidable complications sometimes occur. If you have any problems or questions after discharge, please call your caregiver. HOME CARE INSTRUCTIONS  Put ice on the operative site.  Put  ice in a plastic bag.  Place a towel between your skin and the bag.  Leave the ice on for 15-20 minutes at a time, 3-4 times a day while awake.  Change bandages (dressings) as directed.  Keep the wound dry and clean. The wound may be washed gently with soap and water. Gently blot or dab the wound dry. It is okay to take showers 24 to 48 hours after surgery. Do not take baths, use swimming pools, or use hot tubs for 10 days, or as directed by your caregiver.  Only take over-the-counter or prescription medicines for pain, discomfort, or fever as directed by your caregiver.  Continue your normal diet as directed.  Do not lift anything more than 10 pounds or play contact sports for 3 weeks, or as directed. SEEK MEDICAL CARE IF:  There is redness, swelling, or increasing pain in the wound.  There is fluid (pus) coming from the wound.  There is drainage from a wound lasting longer than 1 day.  You have an oral temperature above 102 F (38.9 C).  You notice a bad smell coming from the wound or dressing.  The wound  breaks open after the stitches (sutures) have been removed.  You notice increasing pain in the shoulders (shoulder strap areas).  You develop dizzy episodes or fainting while standing.  You feel sick to your stomach (nauseous) or throw up (vomit). SEEK IMMEDIATE MEDICAL CARE IF:  You develop a rash.  You have difficulty breathing.  You develop a reaction or have side effects to medicines you were given. MAKE SURE YOU:   Understand these instructions.  Will watch your condition.  Will get help right away if you are not doing well or get worse. Document Released: 11/01/2006 Document Revised: 12/24/2011 Document Reviewed: 08/31/2009 [facility] Patient Information 2014 [place], [place].

## 2013-05-08 NOTE — Progress Notes (Signed)
Patient ID: Neil [name], male   DOB: [birth year], 72 y.o.   MRN: [id number]  Chief Complaint  Patient presents with  . New Evaluation    eval RIH    HPI Neil [name] is a 72 y.o. male.  He is basically self-referred for evaluation of a painful right inguinal hernia.  He states that he has had a right inguinal hernia for 4 years. It has been getting larger, more painful, and now extends down into his scrotum. He has had to go to the emergency room a couple of times but is always been able to be reduced. 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  His wife encouraged him to come for evaluation. I operated on her first husband. She is here with him today  HPI  Past Medical History  Diagnosis Date  . ALLERGIC RHINITIS 12/04/2007  . BENIGN PROSTATIC HYPERTROPHY 06/05/2007  . BUNDLE BRANCH BLOCK, RIGHT 07/21/2009  . BURSITIS, RIGHT SHOULDER 11/06/2010  . COLONIC POLYPS, HX OF 06/05/2007  . DIVERTICULOSIS, COLON 07/21/2009  . ERECTILE DYSFUNCTION 06/05/2007  . HYPERLIPIDEMIA 06/05/2007  . HYPERTENSION 06/05/2007  . INGUINAL HERNIA, RIGHT, SMALL 11/06/2010  . SKIN LESION 06/03/2008  . DIABETES MELLITUS, TYPE II 12/04/2007    pt states not diabetic-no meds 02-05-13 PV    Past Surgical History  Procedure Laterality Date  . Colonoscopy    . Polypectomy    . Hemorrhoid surgery    . Finger surgery      Family History  Problem Relation Age of Onset  . Colon cancer Mother 37  . Brain cancer Mother 65    mets from colon cancer  . Rectal cancer Neg Hx   . Stomach cancer Neg Hx     Social History History  Substance Use Topics  . Smoking status: Former Smoker    Quit date: 10/15/2005  . Smokeless tobacco: Never Used  . Alcohol Use: No     Comment: rare    Allergies  Allergen Reactions  . Ezetimibe Other (See Comments)     Muscle and bone pain  . Statins     REACTION: muscle aches    Current Outpatient Prescriptions  Medication Sig Dispense Refill  . acetaminophen (TYLENOL) 500 MG tablet Take 1,500 mg by mouth every 6 (six) hours as needed. For pain      . benazepril (LOTENSIN) 40 MG tablet Take 1 tablet (40 mg total) by mouth daily.  90 tablet  3  . fenofibrate 160 MG tablet TAKE ONE TABLET BY MOUTH EVERY DAY  90 tablet  2  . sodium chloride (OCEAN) 0.65 % nasal spray Place 1 spray into the nose at bedtime.      [name] terazosin (HYTRIN) 2 MG capsule Take 1 capsule (2 mg total) by mouth at bedtime.  90 capsule  3  . DM-Doxylamine-Acetaminophen (VICKS NYQUIL COLD & FLU NIGHT) 15-6.25-325 MG/15ML LIQD Take 30 mLs by mouth at bedtime as needed. For cough and cold       No current facility-administered medications for this visit.    Review of Systems Review of Systems  Constitutional: Negative for fever, chills and unexpected weight change.  HENT: Negative for hearing loss, congestion, sore throat, trouble swallowing and voice change.   Eyes: Negative for visual disturbance.  Respiratory: Negative for cough  and wheezing.   Cardiovascular: Negative for chest pain, palpitations and leg swelling.  Gastrointestinal: Negative for nausea, vomiting, abdominal pain, diarrhea, constipation, blood in stool, abdominal distention, anal bleeding and rectal pain.  Genitourinary: Positive for scrotal swelling. Negative for hematuria and difficulty urinating.  Musculoskeletal: Negative for arthralgias.  Skin: Negative for rash and wound.  Neurological: Negative for seizures, syncope, weakness and headaches.  Hematological: Negative for adenopathy. Does not bruise/bleed easily.  Psychiatric/Behavioral: Negative for confusion.    Blood pressure 170/90, pulse 80, temperature 97.8 F (36.6 C), temperature source Temporal, resp. rate 15, height 5\' 9"  (1.753 m), weight 171 lb 12.8 oz (77.928 kg).  Physical Exam Physical  Exam  Constitutional: He is oriented to person, place, and time. He appears well-developed and well-nourished. No distress.  HENT:  Head: Normocephalic.  Nose: Nose normal.  Mouth/Throat: No oropharyngeal exudate.  Eyes: Conjunctivae and EOM are normal. Pupils are equal, round, and reactive to light. Right eye exhibits no discharge. Left eye exhibits no discharge. No scleral icterus.  Neck: Normal range of motion. Neck supple. No JVD present. No tracheal deviation present. No thyromegaly present.  Cardiovascular: Normal rate, regular rhythm, normal heart sounds and intact distal pulses.   No murmur heard. Pulmonary/Chest: Effort normal and breath sounds normal. No stridor. No respiratory distress. He has no wheezes. He has no rales. He exhibits no tenderness.  Abdominal: Soft. Bowel sounds are normal. He exhibits no distension and no mass. There is no tenderness. There is no rebound and no guarding.  Genitourinary:  Large right inguinal hernia that extends all the way to the bottom of the scrotum. I was able to slowly manually reduce this with the patient supine. No evidence of hernia on the left. Testicles feel normal. Abdomen soft.  Musculoskeletal: Normal range of motion. He exhibits no edema and no tenderness.  Lymphadenopathy:    He has no cervical adenopathy.  Neurological: He is alert and oriented to person, place, and time. He has normal reflexes. Coordination normal.  Skin: Skin is warm and dry. No rash noted. He is not diaphoretic. No erythema. No pallor.  Psychiatric: He has a normal mood and affect. His behavior is normal. Judgment and thought content normal.    Data Reviewed Office notes from Dr. Oliver [name]. The emergency department notes. Colonoscopy reports.  Assessment    Large, indirect right inguinal hernia with history of repeated incarceration is. He is at high risk for strangulation at some point in the future  Adenomatous colon polyps, last colonoscopy 02/19/2013,  Neil [name]  Hypertension  Right bundle-branch block  Hyperlipidemia  Benign prostatic hypertrophy    Plan    I had a long discussion with the patient and his wife. We went over diagrams in the patient information booklet. I basically told him that this would have to be repaired with an open, anterior approach.  I encouraged him to have this done sooner than later due to the repeated episodes of incarceration.  He states that he wants to go home and think about this. He is still worried about his job. He understands that I think it is in his best interest to proceed with surgery sooner. He will call me back when he is ready to schedule the surgery  I discussed the indications, details, techniques, and numerous risk of open right inguinal hernia repair. We reviewed isues  such as bleeding, infection, recurrence of the hernia, nerve damage chronic pain or numbness, injury to the testicle , bladder or intestine,  and other unforseen problems. He understands these issues. All his questions were answered. He agrees with this plan. I will standby when he makes a decision.       Angelia Mould. Derrell Lolling, M.D., [facility] General and Minimally invasive Surgery Breast and Colorectal Surgery Office:   [phone] Pager:   [phone]  05/08/2013, 4:30 PM

## 2013-05-11 ENCOUNTER — Other Ambulatory Visit (INDEPENDENT_AMBULATORY_CARE_PROVIDER_SITE_OTHER): Payer: Self-pay | Admitting: General Surgery

## 2013-06-05 ENCOUNTER — Other Ambulatory Visit (INDEPENDENT_AMBULATORY_CARE_PROVIDER_SITE_OTHER): Payer: Self-pay | Admitting: *Deleted

## 2013-06-05 DIAGNOSIS — K4090 Unilateral inguinal hernia, without obstruction or gangrene, not specified as recurrent: Secondary | ICD-10-CM

## 2013-06-05 MED ORDER — OXYCODONE-ACETAMINOPHEN 5-325 MG PO TABS: 1.0000 | ORAL_TABLET | ORAL | Status: DC | PRN

## 2013-06-11 ENCOUNTER — Ambulatory Visit: Payer: Medicare Other | Admitting: Internal Medicine

## 2013-06-11 ENCOUNTER — Telehealth (INDEPENDENT_AMBULATORY_CARE_PROVIDER_SITE_OTHER): Payer: Self-pay

## 2013-06-11 NOTE — Telephone Encounter (Signed)
I called and scheduled the po follow up appointment with the patient's wife.

## 2013-06-18 ENCOUNTER — Other Ambulatory Visit (INDEPENDENT_AMBULATORY_CARE_PROVIDER_SITE_OTHER): Payer: Medicare Other

## 2013-06-18 DIAGNOSIS — E119 Type 2 diabetes mellitus without complications: Secondary | ICD-10-CM

## 2013-06-18 LAB — LIPID PANEL
Cholesterol: 168 mg/dL (ref 0–200)
HDL: 42.1 mg/dL (ref >39.00)
Total CHOL/HDL Ratio: 4
Triglycerides: 215 mg/dL — ABNORMAL HIGH (ref 0.0–149.0)
VLDL: 43 mg/dL — ABNORMAL HIGH (ref 0.0–40.0)

## 2013-06-18 LAB — BASIC METABOLIC PANEL
BUN: 19 mg/dL (ref 6–23)
CO2: 27 mEq/L (ref 19–32)
Calcium: 9.7 mg/dL (ref 8.4–10.5)
Chloride: 106 mEq/L (ref 96–112)
Creatinine, Ser: 1.3 mg/dL (ref 0.4–1.5)
GFR: 60.49 mL/min (ref >60.00)
Glucose, Bld: 112 mg/dL — ABNORMAL HIGH (ref 70–99)
Potassium: 4.2 mEq/L (ref 3.5–5.1)
Sodium: 139 mEq/L (ref 135–145)

## 2013-06-18 LAB — HEPATIC FUNCTION PANEL
ALT: 25 U/L (ref 0–53)
AST: 26 U/L (ref 0–37)
Albumin: 4.2 g/dL (ref 3.5–5.2)
Alkaline Phosphatase: 31 U/L — ABNORMAL LOW (ref 39–117)
Bilirubin, Direct: 0.1 mg/dL (ref 0.0–0.3)
Total Bilirubin: 0.6 mg/dL (ref 0.3–1.2)
Total Protein: 7.5 g/dL (ref 6.0–8.3)

## 2013-06-18 LAB — HEMOGLOBIN A1C: Hgb A1c MFr Bld: 6.1 % (ref 4.6–6.5)

## 2013-06-18 LAB — LDL CHOLESTEROL, DIRECT: Direct LDL: 98 mg/dL

## 2013-06-19 ENCOUNTER — Encounter (INDEPENDENT_AMBULATORY_CARE_PROVIDER_SITE_OTHER): Payer: Self-pay | Admitting: General Surgery

## 2013-06-19 ENCOUNTER — Ambulatory Visit (INDEPENDENT_AMBULATORY_CARE_PROVIDER_SITE_OTHER): Payer: Medicare Other | Admitting: General Surgery

## 2013-06-19 VITALS — BP 132/78 | HR 78 | Temp 98.5°F | Resp 15 | Ht 69.0 in | Wt 168.2 lb

## 2013-06-19 DIAGNOSIS — K4090 Unilateral inguinal hernia, without obstruction or gangrene, not specified as recurrent: Secondary | ICD-10-CM

## 2013-06-19 NOTE — Patient Instructions (Signed)
You have recovered from your right inguinal hernia surgery without any obvious surgical problems.  You may resume all normal physical activities after September 22  Continue to walk a lot and to stretch frequently.  Return to see Dr. Derrell Lolling as needed.

## 2013-06-19 NOTE — Progress Notes (Signed)
Patient ID: Neil [name], male   DOB: 24-Apr-1941, 72 y.o.   MRN: [id number] History: This patient underwent open repair right inguinal hernia with mesh on 06/06/1999 for 14. He has done very well. He has no complaints. 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  Exam: Patient looks good. Right groin incision is soft healing normally almost no edema. No infection. No seroma. Hernia repair is intact.  Assessment: Right inguinal hernia, uneventful recovery following open repair with mesh  Plan: Resume normal physical activities after September 22 Walking and stretching Emphasized Return as needed.    Angelia Mould. Derrell Lolling, M.D., [facility] General and Minimally invasive Surgery Breast and Colorectal Surgery Office:   [phone] Pager:   [phone]

## 2013-06-24 ENCOUNTER — Ambulatory Visit (INDEPENDENT_AMBULATORY_CARE_PROVIDER_SITE_OTHER): Payer: Medicare Other | Admitting: Internal Medicine

## 2013-06-24 VITALS — BP 118/68 | HR 75 | Temp 98.8°F | Ht 69.0 in | Wt 170.5 lb

## 2013-06-24 DIAGNOSIS — E119 Type 2 diabetes mellitus without complications: Secondary | ICD-10-CM

## 2013-06-24 DIAGNOSIS — I10 Essential (primary) hypertension: Secondary | ICD-10-CM

## 2013-06-24 DIAGNOSIS — Z0000 Encounter for general adult medical examination without abnormal findings: Secondary | ICD-10-CM

## 2013-06-24 DIAGNOSIS — Z23 Encounter for immunization: Secondary | ICD-10-CM

## 2013-06-24 DIAGNOSIS — E785 Hyperlipidemia, unspecified: Secondary | ICD-10-CM

## 2013-06-24 DIAGNOSIS — IMO0001 Reserved for inherently not codable concepts without codable children: Secondary | ICD-10-CM

## 2013-06-24 NOTE — Patient Instructions (Signed)
There are no changes today Please continue all other medications as before, and refills have been done if requested Please continue your efforts at being more active, low cholesterol diet, and weight control. Please keep your appointments with your specialists as you may have planned  Please remember to sign up for My Chart if you have not done so, as this will be important to you in the future with finding out test results, communicating by private email, and scheduling acute appointments online when needed.  Please return in 6 months, or sooner if needed, with Lab testing done 3-5 days before

## 2013-06-24 NOTE — Progress Notes (Signed)
Subjective:    Patient ID: Neil [name], male    DOB: [birth year], 72 y.o.   MRN: [id number]  HPI  Here to f/u; overall doing ok,  Pt denies chest pain, increased sob or doe, wheezing, orthopnea, PND, increased LE swelling, palpitations, dizziness or syncope.  Pt denies polydipsia, polyuria, or low sugar symptoms such as weakness or confusion improved with po intake.  Pt denies new neurological symptoms such as new headache, or facial or extremity weakness or numbness.   Pt states overall good compliance with meds, has been trying to follow lower cholesterol, diabetic diet, with wt overall down 20 lbs,  but little exercise however. No acute complaints.   For flu shot today Past Medical History  Diagnosis Date  . ALLERGIC RHINITIS 12/04/2007  . BENIGN PROSTATIC HYPERTROPHY 06/05/2007  . BUNDLE BRANCH BLOCK, RIGHT 07/21/2009  . BURSITIS, RIGHT SHOULDER 11/06/2010  . COLONIC POLYPS, HX OF 06/05/2007  . DIVERTICULOSIS, COLON 07/21/2009  . ERECTILE DYSFUNCTION 06/05/2007  . HYPERLIPIDEMIA 06/05/2007  . HYPERTENSION 06/05/2007  . INGUINAL HERNIA, RIGHT, SMALL 11/06/2010  . SKIN LESION 06/03/2008  . DIABETES MELLITUS, TYPE II 12/04/2007    pt states not diabetic-no meds 02-05-13 PV   Past Surgical History  Procedure Laterality Date  . Colonoscopy    . Polypectomy    . Hemorrhoid surgery    . Finger surgery      reports that he quit smoking about 7 years ago. He has never used smokeless tobacco. He reports that he does not drink alcohol or use illicit drugs. family history includes Brain cancer (age of onset: 18) in his mother; Colon cancer (age of onset: 32) in his mother. There is no history of Rectal cancer or Stomach cancer. Allergies  Allergen Reactions  . Ezetimibe Other (See Comments)    Muscle and bone pain  . Statins     REACTION: muscle aches   Current Outpatient Prescriptions on File Prior to Visit  Medication Sig Dispense Refill  . acetaminophen (TYLENOL) 500 MG tablet Take 1,500 mg by  mouth every 6 (six) hours as needed. For pain      . benazepril (LOTENSIN) 40 MG tablet Take 1 tablet (40 mg total) by mouth daily.  90 tablet  3  . DM-Doxylamine-Acetaminophen (VICKS NYQUIL COLD & FLU NIGHT) 15-6.25-325 MG/15ML LIQD Take 30 mLs by mouth at bedtime as needed. For cough and cold      . fenofibrate 160 MG tablet TAKE ONE TABLET BY MOUTH EVERY DAY  90 tablet  2  . sodium chloride (OCEAN) 0.65 % nasal spray Place 1 spray into the nose at bedtime.      [name] terazosin (HYTRIN) 2 MG capsule Take 1 capsule (2 mg total) by mouth at bedtime.  90 capsule  3   No current facility-administered medications on file prior to visit.   Review of Systems  Constitutional: Negative for unexpected weight change, or unusual diaphoresis  HENT: Negative for tinnitus.   Eyes: Negative for photophobia and visual disturbance.  Respiratory: Negative for choking and stridor.   Gastrointestinal: Negative for vomiting and blood in stool.  Genitourinary: Negative for hematuria and decreased urine volume.  Musculoskeletal: Negative for acute joint swelling Skin: Negative for color change and wound.  Neurological: Negative for tremors and numbness other than noted  Psychiatric/Behavioral: Negative for decreased concentration or  hyperactivity.       Objective:   Physical Exam BP 118/68  Pulse 75  Temp(Src) 98.8 F (37.1 C) (Oral)  Ht  5\' 9"  (1.753 m)  Wt 170 lb 8 oz (77.338 kg)  BMI 25.17 kg/m2  SpO2 97% VS noted,  Constitutional: Pt appears well-developed and well-nourished.  HENT: Head: NCAT.  Right Ear: External ear normal.  Left Ear: External ear normal.  Eyes: Conjunctivae and EOM are normal. Pupils are equal, round, and reactive to light.  Neck: Normal range of motion. Neck supple.  Cardiovascular: Normal rate and regular rhythm.   Pulmonary/Chest: Effort normal and breath sounds normal.  Abd:  Soft, NT, non-distended, + BS Neurological: Pt is alert. Not confused  Skin: Skin is warm. No  erythema.  Psychiatric: Pt behavior is normal. Thought content normal.     Assessment & Plan:

## 2013-06-25 ENCOUNTER — Encounter (INDEPENDENT_AMBULATORY_CARE_PROVIDER_SITE_OTHER): Payer: Medicare Other | Admitting: General Surgery

## 2013-06-27 ENCOUNTER — Encounter: Payer: Self-pay | Admitting: Internal Medicine

## 2013-06-27 NOTE — Assessment & Plan Note (Signed)
stable overall by history and exam, recent data reviewed with pt, and pt to continue medical treatment as before,  to f/u any worsening symptoms or concerns Lab Results  Component Value Date   LDLCALC 87 11/20/2012

## 2013-06-27 NOTE — Assessment & Plan Note (Signed)
stable overall by history and exam, recent data reviewed with pt, and pt to continue medical treatment as before,  to f/u any worsening symptoms or concerns BP Readings from Last 3 Encounters:  06/24/13 118/68  06/19/13 132/78  05/08/13 170/90

## 2013-06-27 NOTE — Assessment & Plan Note (Signed)
stable overall by history and exam, recent data reviewed with pt, and pt to continue medical treatment as before,  to f/u any worsening symptoms or concerns Lab Results  Component Value Date   HGBA1C 6.1 06/18/2013

## 2013-11-02 ENCOUNTER — Other Ambulatory Visit: Payer: Self-pay | Admitting: Internal Medicine

## 2013-12-15 ENCOUNTER — Other Ambulatory Visit: Payer: Self-pay | Admitting: Internal Medicine

## 2014-03-17 ENCOUNTER — Ambulatory Visit (INDEPENDENT_AMBULATORY_CARE_PROVIDER_SITE_OTHER): Payer: Medicare Other

## 2014-03-17 DIAGNOSIS — Z23 Encounter for immunization: Secondary | ICD-10-CM

## 2014-05-03 ENCOUNTER — Other Ambulatory Visit: Payer: Self-pay | Admitting: Internal Medicine

## 2014-06-16 ENCOUNTER — Ambulatory Visit: Payer: Medicare Other | Admitting: Internal Medicine

## 2014-06-18 ENCOUNTER — Telehealth: Payer: Self-pay | Admitting: Internal Medicine

## 2014-06-18 ENCOUNTER — Other Ambulatory Visit: Payer: Medicare Other

## 2014-06-18 DIAGNOSIS — R7302 Impaired glucose tolerance (oral): Secondary | ICD-10-CM

## 2014-06-18 DIAGNOSIS — Z0000 Encounter for general adult medical examination without abnormal findings: Secondary | ICD-10-CM

## 2014-06-18 NOTE — Telephone Encounter (Signed)
Contacted Neil [name].  I apologized for the confusion this morning.  I did assure him that his lab orders are now in the system.  He states he will try to come by Tues. Or Wed. Morning to get that complete.  He would like for A1C to be included into his lab orders.  Thanks!

## 2014-06-18 NOTE — Telephone Encounter (Signed)
Ms Neil [name]  Pt does NOT have traditional medicare insurance, according to EMR record  Please call Mr Neil [name] to apologize for this error.   He certainly IS allowed to have lab work prior to his visit, since he does not have traditional [employer]

## 2014-06-18 NOTE — Telephone Encounter (Signed)
Patient came in for labs before his CPE.  He was upset to find out that he would have to have his CPE first before labs since he is a medicare patient.  He states that this is very unacceptable to him that he was not notified by our office.  I told him that I would let the provider and the office manager know.

## 2014-06-23 ENCOUNTER — Other Ambulatory Visit (INDEPENDENT_AMBULATORY_CARE_PROVIDER_SITE_OTHER): Payer: Medicare Other

## 2014-06-23 DIAGNOSIS — E119 Type 2 diabetes mellitus without complications: Secondary | ICD-10-CM

## 2014-06-23 DIAGNOSIS — R7302 Impaired glucose tolerance (oral): Secondary | ICD-10-CM

## 2014-06-23 DIAGNOSIS — R7309 Other abnormal glucose: Secondary | ICD-10-CM

## 2014-06-23 DIAGNOSIS — Z125 Encounter for screening for malignant neoplasm of prostate: Secondary | ICD-10-CM

## 2014-06-23 DIAGNOSIS — Z0000 Encounter for general adult medical examination without abnormal findings: Secondary | ICD-10-CM

## 2014-06-23 LAB — CBC WITH DIFFERENTIAL/PLATELET
Basophils Absolute: 0 10*3/uL (ref 0.0–0.1)
Basophils Relative: 0.7 % (ref 0.0–3.0)
Eosinophils Absolute: 0.1 10*3/uL (ref 0.0–0.7)
Eosinophils Relative: 1.3 % (ref 0.0–5.0)
HCT: 42.3 % (ref 39.0–52.0)
Hemoglobin: 14.1 g/dL (ref 13.0–17.0)
Lymphocytes Relative: 23 % (ref 12.0–46.0)
Lymphs Abs: 1.5 10*3/uL (ref 0.7–4.0)
MCHC: 33.2 g/dL (ref 30.0–36.0)
MCV: 88.4 fl (ref 78.0–100.0)
Monocytes Absolute: 0.7 10*3/uL (ref 0.1–1.0)
Monocytes Relative: 10.9 % (ref 3.0–12.0)
Neutro Abs: 4.2 10*3/uL (ref 1.4–7.7)
Neutrophils Relative %: 64.1 % (ref 43.0–77.0)
Platelets: 237 10*3/uL (ref 150.0–400.0)
RBC: 4.78 Mil/uL (ref 4.22–5.81)
RDW: 13.8 % (ref 11.5–15.5)
WBC: 6.5 10*3/uL (ref 4.0–10.5)

## 2014-06-23 LAB — URINALYSIS, ROUTINE W REFLEX MICROSCOPIC
Bilirubin Urine: NEGATIVE
Hgb urine dipstick: NEGATIVE
Ketones, ur: NEGATIVE
Leukocytes, UA: NEGATIVE
Nitrite: NEGATIVE
Specific Gravity, Urine: 1.02 (ref 1.000–1.030)
Total Protein, Urine: NEGATIVE
Urine Glucose: NEGATIVE
Urobilinogen, UA: 0.2 (ref 0.0–1.0)
pH: 6 (ref 5.0–8.0)

## 2014-06-23 LAB — LIPID PANEL
Cholesterol: 178 mg/dL (ref 0–200)
HDL: 39 mg/dL — ABNORMAL LOW (ref >39.00)
LDL Cholesterol: 116 mg/dL — ABNORMAL HIGH (ref 0–99)
NonHDL: 139
Total CHOL/HDL Ratio: 5
Triglycerides: 116 mg/dL (ref 0.0–149.0)
VLDL: 23.2 mg/dL (ref 0.0–40.0)

## 2014-06-23 LAB — HEPATIC FUNCTION PANEL
ALT: 44 U/L (ref 0–53)
AST: 44 U/L — ABNORMAL HIGH (ref 0–37)
Albumin: 3.9 g/dL (ref 3.5–5.2)
Alkaline Phosphatase: 36 U/L — ABNORMAL LOW (ref 39–117)
Bilirubin, Direct: 0.1 mg/dL (ref 0.0–0.3)
Total Bilirubin: 0.6 mg/dL (ref 0.2–1.2)
Total Protein: 6.9 g/dL (ref 6.0–8.3)

## 2014-06-23 LAB — BASIC METABOLIC PANEL
BUN: 19 mg/dL (ref 6–23)
CO2: 30 mEq/L (ref 19–32)
Calcium: 9.5 mg/dL (ref 8.4–10.5)
Chloride: 107 mEq/L (ref 96–112)
Creatinine, Ser: 1.5 mg/dL (ref 0.4–1.5)
GFR: 49.64 mL/min — ABNORMAL LOW (ref >60.00)
Glucose, Bld: 110 mg/dL — ABNORMAL HIGH (ref 70–99)
Potassium: 4.5 mEq/L (ref 3.5–5.1)
Sodium: 141 mEq/L (ref 135–145)

## 2014-06-23 LAB — HEMOGLOBIN A1C: Hgb A1c MFr Bld: 6.1 % (ref 4.6–6.5)

## 2014-06-23 LAB — TSH: TSH: 0.43 u[IU]/mL (ref 0.35–4.50)

## 2014-06-23 LAB — PSA: PSA: 2.1 ng/mL (ref 0.10–4.00)

## 2014-06-25 ENCOUNTER — Encounter: Payer: Self-pay | Admitting: Internal Medicine

## 2014-06-25 ENCOUNTER — Telehealth: Payer: Self-pay | Admitting: Internal Medicine

## 2014-06-25 ENCOUNTER — Ambulatory Visit (INDEPENDENT_AMBULATORY_CARE_PROVIDER_SITE_OTHER): Payer: Medicare Other | Admitting: Internal Medicine

## 2014-06-25 VITALS — BP 130/80 | HR 70 | Temp 98.6°F | Wt 173.0 lb

## 2014-06-25 DIAGNOSIS — E1165 Type 2 diabetes mellitus with hyperglycemia: Secondary | ICD-10-CM

## 2014-06-25 DIAGNOSIS — IMO0001 Reserved for inherently not codable concepts without codable children: Secondary | ICD-10-CM

## 2014-06-25 DIAGNOSIS — Z0000 Encounter for general adult medical examination without abnormal findings: Secondary | ICD-10-CM

## 2014-06-25 DIAGNOSIS — Z23 Encounter for immunization: Secondary | ICD-10-CM

## 2014-06-25 DIAGNOSIS — E119 Type 2 diabetes mellitus without complications: Secondary | ICD-10-CM

## 2014-06-25 NOTE — Progress Notes (Signed)
Pre visit review using our clinic review tool, if applicable. No additional management support is needed unless otherwise documented below in the visit note. 

## 2014-06-25 NOTE — Progress Notes (Signed)
Subjective:    Patient ID: RAYMONE PEMBROKE, male    DOB: [birth year], 73 y.o.   MRN: [id number]  HPI  Here for wellness and f/u;  Overall doing ok;  Pt denies CP, worsening SOB, DOE, wheezing, orthopnea, PND, worsening LE edema, palpitations, dizziness or syncope.  Pt denies neurological change such as new headache, facial or extremity weakness.  Pt denies polydipsia, polyuria, or low sugar symptoms. Pt states overall good compliance with treatment and medications, good tolerability, and has been trying to follow lower cholesterol diet.  Pt denies worsening depressive symptoms, suicidal ideation or panic. No fever, night sweats, wt loss, loss of appetite, or other constitutional symptoms.  Pt states good ability with ADL's, has low fall risk, home safety reviewed and adequate, no other significant changes in hearing or vision, and only occasionally active with exercise.  Working part time irreg hours. No other current complaints, except for ongoing stress with wife chronic illness. For flu shot today Past Medical History  Diagnosis Date  . ALLERGIC RHINITIS 12/04/2007  . BENIGN PROSTATIC HYPERTROPHY 06/05/2007  . BUNDLE BRANCH BLOCK, RIGHT 07/21/2009  . BURSITIS, RIGHT SHOULDER 11/06/2010  . COLONIC POLYPS, HX OF 06/05/2007  . DIVERTICULOSIS, COLON 07/21/2009  . ERECTILE DYSFUNCTION 06/05/2007  . HYPERLIPIDEMIA 06/05/2007  . HYPERTENSION 06/05/2007  . INGUINAL HERNIA, RIGHT, SMALL 11/06/2010  . SKIN LESION 06/03/2008  . DIABETES MELLITUS, TYPE II 12/04/2007    pt states not diabetic-no meds 02-05-13 PV   Past Surgical History  Procedure Laterality Date  . Colonoscopy    . Polypectomy    . Hemorrhoid surgery    . Finger surgery      reports that he quit smoking about 8 years ago. He has never used smokeless tobacco. He reports that he does not drink alcohol or use illicit drugs. family history includes Brain cancer (age of onset: 81) in his mother; Colon cancer (age of onset: 70) in his mother. There  is no history of Rectal cancer or Stomach cancer. Allergies  Allergen Reactions  . Ezetimibe Other (See Comments)    Muscle and bone pain  . Statins     REACTION: muscle aches   Current Outpatient Prescriptions on File Prior to Visit  Medication Sig Dispense Refill  . acetaminophen (TYLENOL) 500 MG tablet Take 1,500 mg by mouth every 6 (six) hours as needed. For pain      . benazepril (LOTENSIN) 40 MG tablet TAKE ONE TABLET BY MOUTH ONCE DAILY  90 tablet  0  . fenofibrate 160 MG tablet TAKE ONE TABLET BY MOUTH ONCE DAILY  90 tablet  3  . sodium chloride (OCEAN) 0.65 % nasal spray Place 1 spray into the nose at bedtime.      [name] terazosin (HYTRIN) 2 MG capsule TAKE ONE CAPSULE BY MOUTH AT BEDTIME  90 capsule  0   No current facility-administered medications on file prior to visit.   Review of Systems Constitutional: Negative for increased diaphoresis, other activity, appetite or other siginficant weight change  HENT: Negative for worsening hearing loss, ear pain, facial swelling, mouth sores and neck stiffness.   Eyes: Negative for other worsening pain, redness or visual disturbance.  Respiratory: Negative for shortness of breath and wheezing.   Cardiovascular: Negative for chest pain and palpitations.  Gastrointestinal: Negative for diarrhea, blood in stool, abdominal distention or other pain Genitourinary: Negative for hematuria, flank pain or change in urine volume.  Musculoskeletal: Negative for myalgias or other joint complaints.  Skin: Negative for  color change and wound.  Neurological: Negative for syncope and numbness. other than noted Hematological: Negative for adenopathy. or other swelling Psychiatric/Behavioral: Negative for hallucinations, self-injury, decreased concentration or other worsening agitation.      Objective:   Physical Exam BP 130/80  Pulse 70  Temp(Src) 98.6 F (37 C) (Oral)  Wt 173 lb (78.472 kg)  SpO2 95% VS noted,  Constitutional: Pt is oriented to  person, place, and time. Appears well-developed and well-nourished.  Head: Normocephalic and atraumatic.  Right Ear: External ear normal.  Left Ear: External ear normal.  Nose: Nose normal.  Mouth/Throat: Oropharynx is clear and moist.  Eyes: Conjunctivae and EOM are normal. Pupils are equal, round, and reactive to light.  Neck: Normal range of motion. Neck supple. No JVD present. No tracheal deviation present.  Cardiovascular: Normal rate, regular rhythm, normal heart sounds and intact distal pulses.   Pulmonary/Chest: Effort normal and breath sounds without rales or wheezing  Abdominal: Soft. Bowel sounds are normal. NT. No HSM  Musculoskeletal: Normal range of motion. Exhibits no edema.  Lymphadenopathy:  Has no cervical adenopathy.  Neurological: Pt is alert and oriented to person, place, and time. Pt has normal reflexes. No cranial nerve deficit. Motor grossly intact Skin: Skin is warm and dry. No rash noted.  Psychiatric:  Has normal mood and affect. Behavior is normal.     Assessment & Plan:

## 2014-06-25 NOTE — Patient Instructions (Addendum)
You had the flu shot today  Please continue all other medications as before, and refills have been done if requested.  Please have the pharmacy call with any other refills you may need.  Please continue your efforts at being more active, low cholesterol diet, and weight control.  You are otherwise up to date with prevention measures today.  Please keep your appointments with your specialists as you may have planned  Please return in 6 months, or sooner if needed, with Lab testing done 3-5 days before  

## 2014-06-25 NOTE — Telephone Encounter (Signed)
md has already place future cpx labs...Neil [name]

## 2014-06-25 NOTE — Telephone Encounter (Signed)
Patient wants to make sure labs will be entered in to the system for his next office visit and not expired.

## 2014-06-26 NOTE — Assessment & Plan Note (Signed)

## 2014-06-26 NOTE — Assessment & Plan Note (Signed)
stable overall by history and exam, recent data reviewed with pt, and pt to continue medical treatment as before,  to f/u any worsening symptoms or concerns Lab Results  Component Value Date   HGBA1C 6.1 06/23/2014

## 2014-07-26 ENCOUNTER — Telehealth: Payer: Self-pay

## 2014-07-26 NOTE — Telephone Encounter (Signed)
Left a message for call back.  Called patient regarding diabetic eye exam.  When patient calls back please ask:  Have you had a recent (2014-2015) eye exam?    Date of Exam?  Where?    

## 2014-07-26 NOTE — Telephone Encounter (Signed)
Pt stated that he has not had an eye exam this year and does not plan on scheduling one.

## 2014-08-03 ENCOUNTER — Other Ambulatory Visit: Payer: Self-pay | Admitting: Internal Medicine

## 2014-12-13 ENCOUNTER — Other Ambulatory Visit: Payer: Self-pay | Admitting: Internal Medicine

## 2014-12-16 ENCOUNTER — Other Ambulatory Visit (INDEPENDENT_AMBULATORY_CARE_PROVIDER_SITE_OTHER): Payer: Medicare Other

## 2014-12-16 DIAGNOSIS — E1165 Type 2 diabetes mellitus with hyperglycemia: Secondary | ICD-10-CM | POA: Diagnosis not present

## 2014-12-16 DIAGNOSIS — IMO0002 Reserved for concepts with insufficient information to code with codable children: Secondary | ICD-10-CM

## 2014-12-16 LAB — HEPATIC FUNCTION PANEL
ALT: 34 U/L (ref 0–53)
AST: 26 U/L (ref 0–37)
Albumin: 4.3 g/dL (ref 3.5–5.2)
Alkaline Phosphatase: 57 U/L (ref 39–117)
Bilirubin, Direct: 0.2 mg/dL (ref 0.0–0.3)
Total Bilirubin: 0.5 mg/dL (ref 0.2–1.2)
Total Protein: 7.7 g/dL (ref 6.0–8.3)

## 2014-12-16 LAB — LIPID PANEL
Cholesterol: 186 mg/dL (ref 0–200)
HDL: 44.4 mg/dL (ref >39.00)
LDL Cholesterol: 108 mg/dL — ABNORMAL HIGH (ref 0–99)
NonHDL: 141.6
Total CHOL/HDL Ratio: 4
Triglycerides: 168 mg/dL — ABNORMAL HIGH (ref 0.0–149.0)
VLDL: 33.6 mg/dL (ref 0.0–40.0)

## 2014-12-16 LAB — BASIC METABOLIC PANEL
BUN: 20 mg/dL (ref 6–23)
CO2: 27 mEq/L (ref 19–32)
Calcium: 9.7 mg/dL (ref 8.4–10.5)
Chloride: 105 mEq/L (ref 96–112)
Creatinine, Ser: 1.37 mg/dL (ref 0.40–1.50)
GFR: 54.19 mL/min — ABNORMAL LOW (ref >60.00)
Glucose, Bld: 138 mg/dL — ABNORMAL HIGH (ref 70–99)
Potassium: 4.4 mEq/L (ref 3.5–5.1)
Sodium: 141 mEq/L (ref 135–145)

## 2014-12-16 LAB — HEMOGLOBIN A1C: Hgb A1c MFr Bld: 6.6 % — ABNORMAL HIGH (ref 4.6–6.5)

## 2014-12-24 ENCOUNTER — Encounter: Payer: Self-pay | Admitting: Internal Medicine

## 2014-12-24 ENCOUNTER — Ambulatory Visit (INDEPENDENT_AMBULATORY_CARE_PROVIDER_SITE_OTHER): Payer: Medicare Other | Admitting: Internal Medicine

## 2014-12-24 VITALS — BP 124/78 | HR 73 | Temp 99.0°F | Resp 18 | Ht 69.0 in | Wt 170.1 lb

## 2014-12-24 DIAGNOSIS — I10 Essential (primary) hypertension: Secondary | ICD-10-CM

## 2014-12-24 DIAGNOSIS — Z0189 Encounter for other specified special examinations: Secondary | ICD-10-CM

## 2014-12-24 DIAGNOSIS — Z720 Tobacco use: Secondary | ICD-10-CM

## 2014-12-24 DIAGNOSIS — Z0000 Encounter for general adult medical examination without abnormal findings: Secondary | ICD-10-CM

## 2014-12-24 DIAGNOSIS — E119 Type 2 diabetes mellitus without complications: Secondary | ICD-10-CM

## 2014-12-24 DIAGNOSIS — F17200 Nicotine dependence, unspecified, uncomplicated: Secondary | ICD-10-CM

## 2014-12-24 DIAGNOSIS — E785 Hyperlipidemia, unspecified: Secondary | ICD-10-CM

## 2014-12-24 NOTE — Patient Instructions (Signed)
Please continue all other medications as before, and refills have been done if requested.  Please have the pharmacy call with any other refills you may need.  Please continue your efforts at being more active, low cholesterol diet, and weight control.  You are otherwise up to date with prevention measures today.  Please keep your appointments with your specialists as you may have planned  You will be contacted regarding the referral for: Low dose CT scan for CHest for lung cancer screening  Please return in 6 months, or sooner if needed, with Lab testing done 3-5 days before

## 2014-12-24 NOTE — Assessment & Plan Note (Signed)
stable overall by history and exam, recent data reviewed with pt, and pt to continue medical treatment as before,  to f/u any worsening symptoms or concerns BP Readings from Last 3 Encounters:  12/24/14 124/78  06/25/14 130/80  06/24/13 118/68

## 2014-12-24 NOTE — Assessment & Plan Note (Signed)
For start low dose CT chest for lung cancer screening

## 2014-12-24 NOTE — Progress Notes (Signed)
Subjective:    Patient ID: Neil [name], male    DOB: Sep 28, 1941, 74 y.o.   MRN: [id number]  HPI  Here to f/u; overall doing ok,  Pt denies chest pain, increasing sob or doe, wheezing, orthopnea, PND, increased LE swelling, palpitations, dizziness or syncope.  Pt denies new neurological symptoms such as new headache, or facial or extremity weakness or numbness.  Pt denies polydipsia, polyuria, or low sugar episode.   Pt denies new neurological symptoms such as new headache, or facial or extremity weakness or numbness.   Pt states overall good compliance with meds, mostly trying to follow appropriate diet, with wt overall stable,  but little exercise however.   Quit smoking x 8 yrs, prior 1.5 ppd x 50 yrs. For start low dose CT lung Ca screening;  Per CMS guidelines, I have determined the eligibility including patient age (59-80), and absence of any signs of lung cancer.  Specific calculation of number of pack years is documented.  Quit smoking years is documented.  Shared decision making engaged today, including a discussion of the benefits and harms of screening, discussion of need for followup with additional testing, risks of over-diagnosis, risk of false-positive screening examinations, and risk of radiation exposure.  Counseling done today on the importance of adherence to annual lunge cancer LDCT screening, importance of quit smoking and remaining quit, and tobacco cessation instructions given.  There is also discussion with patient regarding the impact of comorbidities, and patient states is able and willing to undergo diagnosis and treatment.  Past Medical History  Diagnosis Date  . ALLERGIC RHINITIS 12/04/2007  . BENIGN PROSTATIC HYPERTROPHY 06/05/2007  . BUNDLE BRANCH BLOCK, RIGHT 07/21/2009  . BURSITIS, RIGHT SHOULDER 11/06/2010  . COLONIC POLYPS, HX OF 06/05/2007  . DIVERTICULOSIS, COLON 07/21/2009  . ERECTILE DYSFUNCTION 06/05/2007  . HYPERLIPIDEMIA 06/05/2007  . HYPERTENSION 06/05/2007  .  INGUINAL HERNIA, RIGHT, SMALL 11/06/2010  . SKIN LESION 06/03/2008  . DIABETES MELLITUS, TYPE II 12/04/2007    pt states not diabetic-no meds 02-05-13 PV   Past Surgical History  Procedure Laterality Date  . Colonoscopy    . Polypectomy    . Hemorrhoid surgery    . Finger surgery      reports that he quit smoking about 9 years ago. He has never used smokeless tobacco. He reports that he does not drink alcohol or use illicit drugs. family history includes Brain cancer (age of onset: 19) in his mother; Colon cancer (age of onset: 48) in his mother. There is no history of Rectal cancer or Stomach cancer. Allergies  Allergen Reactions  . Ezetimibe Other (See Comments)    Muscle and bone pain  . Statins     REACTION: muscle aches   Current Outpatient Prescriptions on File Prior to Visit  Medication Sig Dispense Refill  . acetaminophen (TYLENOL) 500 MG tablet Take 1,500 mg by mouth every 6 (six) hours as needed. For pain    . benazepril (LOTENSIN) 40 MG tablet TAKE ONE TABLET BY MOUTH ONCE DAILY 90 tablet 3  . fenofibrate 160 MG tablet TAKE ONE TABLET BY MOUTH ONCE DAILY 90 tablet 2  . sodium chloride (OCEAN) 0.65 % nasal spray Place 1 spray into the nose at bedtime.    [name] terazosin (HYTRIN) 2 MG capsule TAKE ONE CAPSULE BY MOUTH ONCE DAILY AT BEDTIME 90 capsule 3   No current facility-administered medications on file prior to visit.   Review of Systems  Constitutional: Negative for unusual diaphoresis or  night sweats HENT: Negative for ringing in ear or discharge Eyes: Negative for double vision or worsening visual disturbance.  Respiratory: Negative for choking and stridor.   Gastrointestinal: Negative for vomiting or other signifcant bowel change Genitourinary: Negative for hematuria or change in urine volume.  Musculoskeletal: Negative for other MSK pain or swelling Skin: Negative for color change and worsening wound.  Neurological: Negative for tremors and numbness other than noted    Psychiatric/Behavioral: Negative for decreased concentration or agitation other than above       Objective:   Physical Exam BP 124/78 mmHg  Pulse 73  Temp(Src) 99 F (37.2 C) (Oral)  Resp 18  Ht 5\' 9"  (1.753 m)  Wt 170 lb 1.3 oz (77.148 kg)  BMI 25.11 kg/m2  SpO2 97% VS noted,  Constitutional: Pt appears in no significant distress HENT: Head: NCAT.  Right Ear: External ear normal.  Left Ear: External ear normal.  Eyes: . Pupils are equal, round, and reactive to light. Conjunctivae and EOM are normal Neck: Normal range of motion. Neck supple.  Cardiovascular: Normal rate and regular rhythm.   Pulmonary/Chest: Effort normal and breath sounds decreased without rales or wheezing.  Abd:  Soft, NT, ND, + BS Neurological: Pt is alert. Not confused , motor grossly intact Skin: Skin is warm. No rash, no LE edema Psychiatric: Pt behavior is normal. No agitation.      Assessment & Plan:

## 2014-12-24 NOTE — Progress Notes (Signed)
Pre visit review using our clinic review tool, if applicable. No additional management support is needed unless otherwise documented below in the visit note. 

## 2014-12-24 NOTE — Assessment & Plan Note (Signed)
stable overall by history and exam, recent data reviewed with pt, and pt to continue medical treatment as before,  to f/u any worsening symptoms or concerns Lab Results  Component Value Date   HGBA1C 6.6* 12/16/2014

## 2014-12-24 NOTE — Assessment & Plan Note (Signed)
stable overall by history and exam, recent data reviewed with pt, and pt to continue medical treatment as before,  to f/u any worsening symptoms or concerns Lab Results  Component Value Date   LDLCALC 108* 12/16/2014

## 2014-12-27 ENCOUNTER — Telehealth: Payer: Self-pay | Admitting: Internal Medicine

## 2014-12-27 NOTE — Telephone Encounter (Signed)
emmi emailed °

## 2015-01-19 ENCOUNTER — Ambulatory Visit (INDEPENDENT_AMBULATORY_CARE_PROVIDER_SITE_OTHER)
Admission: RE | Admit: 2015-01-19 | Discharge: 2015-01-19 | Disposition: A | Discharge status: Home / Self Care | Payer: Medicare Other | Source: Ambulatory Visit | Attending: Internal Medicine | Admitting: Internal Medicine

## 2015-01-19 DIAGNOSIS — Z720 Tobacco use: Secondary | ICD-10-CM

## 2015-01-19 DIAGNOSIS — Z87891 Personal history of nicotine dependence: Secondary | ICD-10-CM | POA: Diagnosis not present

## 2015-01-19 DIAGNOSIS — F17200 Nicotine dependence, unspecified, uncomplicated: Secondary | ICD-10-CM

## 2015-01-19 DIAGNOSIS — Z122 Encounter for screening for malignant neoplasm of respiratory organs: Secondary | ICD-10-CM | POA: Diagnosis not present

## 2015-02-06 ENCOUNTER — Telehealth: Payer: Self-pay | Admitting: Family

## 2015-02-06 DIAGNOSIS — R1110 Vomiting, unspecified: Secondary | ICD-10-CM

## 2015-02-06 MED ORDER — ONDANSETRON HCL 4 MG PO TABS: 4.0000 mg | ORAL_TABLET | Freq: Three times a day (TID) | ORAL | Status: DC | PRN

## 2015-02-06 NOTE — Progress Notes (Signed)
Based on what you shared with me it looks like you have a serious condition that should be evaluated in a face to face office visit.  Due to the fact that this is occurring a week later and spontaneously, we need to do a workup where we gather lab work and more information from a physical exam. There reason for this is that spontaneous nausea/vomiting and chills are occasionally indicative of a cardiac or neurological condition that could be more serious. Your symptoms are not consistent with the flu or a typical sinus infection. Additionally, the dark urine is of concern.   In the meantime, I have prescribed zofran 4mg  every 8 hours as needed for nausea/vomiting. Please see a provider face-to-face and do your best to stay hydrated with plenty of fluids.   If you are having a true medical emergency please call 911.  If you need an urgent face to face visit, [facility] has four urgent care centers for your convenience.  . [facility] a Provider at this Location  [address] [place], [place] [address] . 8 am to 8 pm Monday-Friday . 9 am to 7 pm Saturday-Sunday  . [facility] [place] a Provider at this Location  [address] [place], [place], [place] [address] . 8 am to 8 pm Monday-Friday . 9 am to 6 pm Saturday . 11 am to 6 pm Sunday   . [facility] [place] Get Driving Directions  [address] [place], [place] [address] . 8 am to 8 pm Monday-Friday . 9 am to 4 pm Saturday-Sunday   . [facility] (a walk in primary care provider)  [place] a Provider at this Location  [place], [place] [address] . 8 am to 8:30 pm Monday-Thursday . 8 am to 6 pm Friday . 8 am to 4 pm Saturday-Sunday   Your e-visit answers were reviewed by a  board certified advanced clinical practitioner to complete your personal care plan.  Depending on the condition, your plan could have included both over the counter or prescription medications.  You will get an e-mail in the next two days asking about your experience.  I hope that your e-visit has been valuable and will speed your recovery . Thank you for choosing an e-visit.

## 2015-02-08 ENCOUNTER — Ambulatory Visit (INDEPENDENT_AMBULATORY_CARE_PROVIDER_SITE_OTHER): Payer: Medicare Other | Admitting: Internal Medicine

## 2015-02-08 ENCOUNTER — Encounter: Payer: Self-pay | Admitting: Internal Medicine

## 2015-02-08 ENCOUNTER — Other Ambulatory Visit (INDEPENDENT_AMBULATORY_CARE_PROVIDER_SITE_OTHER): Payer: Medicare Other

## 2015-02-08 VITALS — BP 120/78 | HR 73 | Temp 98.7°F | Resp 18 | Ht 69.0 in | Wt 160.0 lb

## 2015-02-08 DIAGNOSIS — R8290 Unspecified abnormal findings in urine: Secondary | ICD-10-CM

## 2015-02-08 DIAGNOSIS — E119 Type 2 diabetes mellitus without complications: Secondary | ICD-10-CM | POA: Diagnosis not present

## 2015-02-08 DIAGNOSIS — I10 Essential (primary) hypertension: Secondary | ICD-10-CM

## 2015-02-08 LAB — URINALYSIS, ROUTINE W REFLEX MICROSCOPIC
Hgb urine dipstick: NEGATIVE
Ketones, ur: NEGATIVE
Leukocytes, UA: NEGATIVE
Nitrite: NEGATIVE
Specific Gravity, Urine: 1.025 (ref 1.000–1.030)
Total Protein, Urine: NEGATIVE
Urine Glucose: NEGATIVE
Urobilinogen, UA: 1 (ref 0.0–1.0)
pH: 6 (ref 5.0–8.0)

## 2015-02-08 MED ORDER — CIPROFLOXACIN HCL 250 MG PO TABS: 250.0000 mg | ORAL_TABLET | Freq: Two times a day (BID) | ORAL | Status: DC

## 2015-02-08 NOTE — Assessment & Plan Note (Signed)
With brief color change, probably blood, assoc with chills and nausea, lasting hours only, but no pain, occurred at least twice in past 2 wks - suspect renal stone wtihout pain/ UTI vs other such as malignancy related;  For urine studies, cipro empiric, CT abd/pelvis r/o stone, refer urology to r/o malignancyt

## 2015-02-08 NOTE — Progress Notes (Signed)
Pre visit review using our clinic review tool, if applicable. No additional management support is needed unless otherwise documented below in the visit note. 

## 2015-02-08 NOTE — Progress Notes (Signed)
Subjective:    Patient ID: Neil [name], male    DOB: Apr 29, 1941, 74 y.o.   MRN: [id number]  HPI  Here to f/u, had evisit that rec'd OV today,  c/o onset chills with nausea starting apr14, though he might be developing a URI as this is often the way it starts, went home, took nyquil and felt better the next day.  Went to work the whole next wk without problem, but 3 days ago after a shopping outing, had onset shaking chills cheerwine like urine which seemed to clear up within hours after the chills stopped, took a nap, had onset n/v that evening 3 times, No further n/v since that evening 3 days ago, only some slight nausea.   Went to work yesterday, felt somewhat weaken but worked a full day.  Pt denies chest pain, increased sob or doe, wheezing, orthopnea, PND, increased LE swelling, palpitations, dizziness or syncope.  Pt denies new neurological symptoms such as new headache, or facial or extremity weakness or numbness  He reports wife is hoping he is just allergic to shrimp, b/c had it last Friday and the previous Monday and also a few days before the first episode.  Denies urinary symptoms such as dysuria, frequency, urgency, flank pain, or fever. Denies worsening reflux, abd pain, dysphagia, n/v, bowel change or blood, does has some few belches and today onset tiny soreness to upper abd but thinks related to more sneezing today , hard sneezes related to allergies  No prior renal stone, Last UTI many yrs ago.   Past Medical History  Diagnosis Date  . ALLERGIC RHINITIS 12/04/2007  . BENIGN PROSTATIC HYPERTROPHY 06/05/2007  . BUNDLE BRANCH BLOCK, RIGHT 07/21/2009  . BURSITIS, RIGHT SHOULDER 11/06/2010  . COLONIC POLYPS, HX OF 06/05/2007  . DIVERTICULOSIS, COLON 07/21/2009  . ERECTILE DYSFUNCTION 06/05/2007  . HYPERLIPIDEMIA 06/05/2007  . HYPERTENSION 06/05/2007  . INGUINAL HERNIA, RIGHT, SMALL 11/06/2010  . SKIN LESION 06/03/2008  . DIABETES MELLITUS, TYPE II 12/04/2007    pt states not diabetic-no meds  02-05-13 PV   Past Surgical History  Procedure Laterality Date  . Colonoscopy    . Polypectomy    . Hemorrhoid surgery    . Finger surgery      reports that he quit smoking about 9 years ago. He has never used smokeless tobacco. He reports that he does not drink alcohol or use illicit drugs. family history includes Brain cancer (age of onset: 44) in his mother; Colon cancer (age of onset: 59) in his mother. There is no history of Rectal cancer or Stomach cancer. Allergies  Allergen Reactions  . Ezetimibe Other (See Comments)    Muscle and bone pain  . Statins     REACTION: muscle aches   Current Outpatient Prescriptions on File Prior to Visit  Medication Sig Dispense Refill  . acetaminophen (TYLENOL) 500 MG tablet Take 1,500 mg by mouth every 6 (six) hours as needed. For pain    . benazepril (LOTENSIN) 40 MG tablet TAKE ONE TABLET BY MOUTH ONCE DAILY 90 tablet 3  . fenofibrate 160 MG tablet TAKE ONE TABLET BY MOUTH ONCE DAILY 90 tablet 2  . sodium chloride (OCEAN) 0.65 % nasal spray Place 1 spray into the nose at bedtime.    [name] terazosin (HYTRIN) 2 MG capsule TAKE ONE CAPSULE BY MOUTH ONCE DAILY AT BEDTIME 90 capsule 3  . ondansetron (ZOFRAN) 4 MG tablet Take 1 tablet (4 mg total) by mouth every 8 (eight) hours as  needed for nausea or vomiting. (Patient not taking: Reported on 02/08/2015) 21 tablet 0   No current facility-administered medications on file prior to visit.   Review of Systems  Constitutional: Negative for unusual diaphoresis or night sweats HENT: Negative for ringing in ear or discharge Eyes: Negative for double vision or worsening visual disturbance.  Respiratory: Negative for choking and stridor.   Gastrointestinal: Negative for vomiting or other signifcant bowel change Genitourinary: Negative for hematuria or change in urine volume.  Musculoskeletal: Negative for other MSK pain or swelling Skin: Negative for color change and worsening wound.  Neurological: Negative  for tremors and numbness other than noted  Psychiatric/Behavioral: Negative for decreased concentration or agitation other than above       Objective:   Physical Exam BP 120/78 mmHg  Pulse 73  Temp(Src) 98.7 F (37.1 C) (Oral)  Resp 18  Ht 5\' 9"  (1.753 m)  Wt 160 lb 0.6 oz (72.594 kg)  BMI 23.62 kg/m2  SpO2 95% VS noted,  Constitutional: Pt appears in no significant distress HENT: Head: NCAT.  Right Ear: External ear normal.  Left Ear: External ear normal.  Eyes: . Pupils are equal, round, and reactive to light. Conjunctivae and EOM are normal Neck: Normal range of motion. Neck supple.  Cardiovascular: Normal rate and regular rhythm.   Pulmonary/Chest: Effort normal and breath sounds without rales or wheezing.  Abd:  Soft, NT, ND, + BS Neurological: Pt is alert. Not confused , motor grossly intact Skin: Skin is warm. No rash, no LE edema Psychiatric: Pt behavior is normal. No agitation.     Assessment & Plan:

## 2015-02-08 NOTE — Assessment & Plan Note (Signed)
stable overall by history and exam, recent data reviewed with pt, and pt to continue medical treatment as before,  to f/u any worsening symptoms or concerns BP Readings from Last 3 Encounters:  02/08/15 120/78  12/24/14 124/78  06/25/14 130/80

## 2015-02-08 NOTE — Assessment & Plan Note (Signed)
/  stable overall by history and exam, recent data reviewed with pt, and pt to continue medical treatment as before,  to f/u any worsening symptoms or concerns Lab Results  Component Value Date   HGBA1C 6.6* 12/16/2014

## 2015-02-08 NOTE — Patient Instructions (Addendum)
Please take all new medication as prescribed - the antibiotic  Please continue all other medications as before, and refills have been done if requested.  Please have the pharmacy call with any other refills you may need.  Please keep your appointments with your specialists as you may have planned  You will be contacted regarding the referral for: urology, and the CT scan to check for renal stone  Please go to the LAB in the Basement (turn left off the elevator) for the tests to be done today - just the urine test  You will be contacted by phone if any changes need to be made immediately.  Otherwise, you will receive a letter about your results with an explanation, but please check with MyChart first.  Please remember to sign up for MyChart if you have not done so, as this will be important to you in the future with finding out test results, communicating by private email, and scheduling acute appointments online when needed.

## 2015-02-10 LAB — URINE CULTURE
Colony Count: NO GROWTH
Organism ID, Bacteria: NO GROWTH

## 2015-02-11 ENCOUNTER — Other Ambulatory Visit: Payer: Self-pay | Admitting: Internal Medicine

## 2015-02-11 ENCOUNTER — Telehealth: Payer: Self-pay | Admitting: Gastroenterology

## 2015-02-11 ENCOUNTER — Ambulatory Visit (INDEPENDENT_AMBULATORY_CARE_PROVIDER_SITE_OTHER)
Admission: RE | Admit: 2015-02-11 | Discharge: 2015-02-11 | Disposition: A | Discharge status: Home / Self Care | Payer: Medicare Other | Source: Ambulatory Visit | Attending: Internal Medicine | Admitting: Internal Medicine

## 2015-02-11 DIAGNOSIS — K8051 Calculus of bile duct without cholangitis or cholecystitis with obstruction: Secondary | ICD-10-CM

## 2015-02-11 DIAGNOSIS — R300 Dysuria: Secondary | ICD-10-CM | POA: Diagnosis not present

## 2015-02-11 DIAGNOSIS — R8290 Unspecified abnormal findings in urine: Secondary | ICD-10-CM

## 2015-02-11 DIAGNOSIS — K8070 Calculus of gallbladder and bile duct without cholecystitis without obstruction: Secondary | ICD-10-CM | POA: Diagnosis not present

## 2015-02-11 NOTE — Telephone Encounter (Signed)
Vaughan Basta, Have this patient see me in the office next week. He will need LFTs, CBC, PT/INR Monday. Advised the patient to go to the emergency room in the interim if he develops severe pain, fevers, or chills. [name]

## 2015-02-11 NOTE — Telephone Encounter (Signed)
John, Dr. Jenny Reichmann walked up with a CT report about one of your patients, Neil [name].  It looks like he has CBD stones and probably needs an ERCP.  Juluis Rainier  DJ

## 2015-02-11 NOTE — Telephone Encounter (Signed)
Pt scheduled to see [name] 02/16/15@9 :45am. Pt aware.

## 2015-02-13 DIAGNOSIS — K8020 Calculus of gallbladder without cholecystitis without obstruction: Secondary | ICD-10-CM | POA: Insufficient documentation

## 2015-02-14 ENCOUNTER — Other Ambulatory Visit: Payer: Self-pay

## 2015-02-14 DIAGNOSIS — R945 Abnormal results of liver function studies: Principal | ICD-10-CM

## 2015-02-14 DIAGNOSIS — R7989 Other specified abnormal findings of blood chemistry: Secondary | ICD-10-CM

## 2015-02-15 ENCOUNTER — Encounter: Payer: Self-pay | Admitting: Internal Medicine

## 2015-02-15 ENCOUNTER — Other Ambulatory Visit (INDEPENDENT_AMBULATORY_CARE_PROVIDER_SITE_OTHER): Payer: Medicare Other

## 2015-02-15 DIAGNOSIS — R7989 Other specified abnormal findings of blood chemistry: Secondary | ICD-10-CM | POA: Diagnosis not present

## 2015-02-15 DIAGNOSIS — Z5181 Encounter for therapeutic drug level monitoring: Secondary | ICD-10-CM | POA: Diagnosis not present

## 2015-02-15 DIAGNOSIS — R945 Abnormal results of liver function studies: Principal | ICD-10-CM

## 2015-02-15 LAB — HEPATIC FUNCTION PANEL
ALT: 50 U/L (ref 0–53)
AST: 72 U/L — ABNORMAL HIGH (ref 0–37)
Albumin: 3.8 g/dL (ref 3.5–5.2)
Alkaline Phosphatase: 79 U/L (ref 39–117)
Bilirubin, Direct: 0.6 mg/dL — ABNORMAL HIGH (ref 0.0–0.3)
Total Bilirubin: 1.2 mg/dL (ref 0.2–1.2)
Total Protein: 7.3 g/dL (ref 6.0–8.3)

## 2015-02-15 LAB — CBC WITH DIFFERENTIAL/PLATELET
Basophils Absolute: 0 10*3/uL (ref 0.0–0.1)
Basophils Relative: 0.8 % (ref 0.0–3.0)
Eosinophils Absolute: 0.1 10*3/uL (ref 0.0–0.7)
Eosinophils Relative: 0.9 % (ref 0.0–5.0)
HCT: 38.8 % — ABNORMAL LOW (ref 39.0–52.0)
Hemoglobin: 13 g/dL (ref 13.0–17.0)
Lymphocytes Relative: 23.3 % (ref 12.0–46.0)
Lymphs Abs: 1.4 10*3/uL (ref 0.7–4.0)
MCHC: 33.6 g/dL (ref 30.0–36.0)
MCV: 86.5 fl (ref 78.0–100.0)
Monocytes Absolute: 0.6 10*3/uL (ref 0.1–1.0)
Monocytes Relative: 10.1 % (ref 3.0–12.0)
Neutro Abs: 4 10*3/uL (ref 1.4–7.7)
Neutrophils Relative %: 64.9 % (ref 43.0–77.0)
Platelets: 313 10*3/uL (ref 150.0–400.0)
RBC: 4.48 Mil/uL (ref 4.22–5.81)
RDW: 16.8 % — ABNORMAL HIGH (ref 11.5–15.5)
WBC: 6.2 10*3/uL (ref 4.0–10.5)

## 2015-02-15 LAB — PROTIME-INR
INR: 0.9 ratio (ref 0.8–1.0)
Prothrombin Time: 10.5 s (ref 9.6–13.1)

## 2015-02-16 ENCOUNTER — Ambulatory Visit (INDEPENDENT_AMBULATORY_CARE_PROVIDER_SITE_OTHER): Payer: Medicare Other | Admitting: Internal Medicine

## 2015-02-16 ENCOUNTER — Encounter: Payer: Self-pay | Admitting: Internal Medicine

## 2015-02-16 VITALS — BP 122/60 | HR 84 | Temp 98.7°F | Ht 67.25 in | Wt 164.5 lb

## 2015-02-16 DIAGNOSIS — R112 Nausea with vomiting, unspecified: Secondary | ICD-10-CM

## 2015-02-16 DIAGNOSIS — K8050 Calculus of bile duct without cholangitis or cholecystitis without obstruction: Secondary | ICD-10-CM | POA: Diagnosis not present

## 2015-02-16 DIAGNOSIS — R110 Nausea: Secondary | ICD-10-CM

## 2015-02-16 DIAGNOSIS — K8020 Calculus of gallbladder without cholecystitis without obstruction: Secondary | ICD-10-CM

## 2015-02-16 MED ORDER — CIPROFLOXACIN HCL 500 MG PO TABS: 500.0000 mg | ORAL_TABLET | Freq: Two times a day (BID) | ORAL | Status: DC

## 2015-02-16 NOTE — Patient Instructions (Signed)
You have been scheduled for a consult with Dr. Dalbert Batman at [facility] on 03/03/2015 at 11:00am.  Please arrive 15 minutes early.  We have sent the following medications to your pharmacy for you to pick up at your convenience:  Cipro  You have been scheduled for an ERCP at [facility]. Please follow written instructions given to you at your visit today. If you use inhalers (even only as needed), please bring them with you on the day of your procedure.

## 2015-02-16 NOTE — Progress Notes (Signed)
HISTORY OF PRESENT ILLNESS:  Neil [name] is a 74 y.o. male with past medical history as listed below who is referred today for GI consultation by Dr. [name]. I saw the patient in May 2014 when he underwent surveillance colonoscopy. He has a family history of colon cancer as well as a personal history of multiple and advanced adenomatous. 3 polyps removed and follow-up in 3 years recommended. His current history is that of intermittent problems with chills, nausea, vomiting, and darkening urine over the past 4-5 weeks. He has had several self-limited episodes. Minimal abdominal pain. He was evaluated last week by Dr. Jenny Reichmann. Outside records, laboratories, and x-rays personally reviewed. He was felt possibly to have urinary tract infection or kidney stones. Evaluation for that was negative. CT scan of the abdomen and pelvis was performed and revealed cholelithiasis with choledocholithiasis. He was placed on ciprofloxacin 250 mg twice a day. He presents today with his wife. The patient has had no problems since his visit last week. 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 He has had prior inguinal hernia repair with Dr. Fanny Skates. GI review of systems is otherwise negative. I did have him come in for laboratories yesterday. CBC was normal including white blood cell count of 6.2. Differential was normal. Prothrombin time normal. Liver tests normal except for an AST of 72.  REVIEW OF SYSTEMS:  All non-GI ROS negative except for urinary frequency, urinary leakage, chronic back pain, night sweats  Past Medical History  Diagnosis Date  . ALLERGIC RHINITIS 12/04/2007  . BENIGN PROSTATIC HYPERTROPHY 06/05/2007  . BUNDLE BRANCH BLOCK, RIGHT 07/21/2009  . BURSITIS, RIGHT SHOULDER 11/06/2010  . COLONIC POLYPS, HX OF 06/05/2007    tubular adenoma  . DIVERTICULOSIS, COLON 07/21/2009  . ERECTILE DYSFUNCTION 06/05/2007  . HYPERLIPIDEMIA 06/05/2007  . HYPERTENSION 06/05/2007  . INGUINAL HERNIA, RIGHT,  SMALL 11/06/2010  . SKIN LESION 06/03/2008  . DIABETES MELLITUS, TYPE II 12/04/2007    pt states not diabetic-no meds 02-05-13 PV  . Gallstones     Past Surgical History  Procedure Laterality Date  . Colonoscopy    . Polypectomy    . Hemorrhoid surgery    . Finger surgery Left   . Inguinal hernia repair Right   . Tonsillectomy      Social History [name]  reports that he quit smoking about 9 years ago. He has never used smokeless tobacco. He reports that he does not drink alcohol or use illicit drugs.  family history includes Brain cancer (age of onset: 61) in his mother; Colon cancer (age of onset: 18) in his mother. There is no history of Rectal cancer or Stomach cancer.  Allergies  Allergen Reactions  . Ezetimibe Other (See Comments)    Muscle and bone pain  . Statins     REACTION: muscle aches       PHYSICAL EXAMINATION: Vital signs: BP 122/60 mmHg  Pulse 84  Temp(Src) 98.7 F (37.1 C)  Ht 5' 7.25" (1.708 m)  Wt 164 lb 8 oz (74.617 kg)  BMI 25.58 kg/m2  Constitutional: generally well-appearing, no acute distress Psychiatric: alert and oriented x3, cooperative Eyes: extraocular movements intact, anicteric, conjunctiva pink Mouth: oral pharynx moist, no lesions Neck: supple no lymphadenopathy Cardiovascular: heart regular rate and rhythm, no murmur Lungs: clear to auscultation bilaterally Abdomen: soft, nontender, nondistended, no obvious ascites, no peritoneal signs, normal bowel sounds, no organomegaly Rectal: Omitted Extremities: no lower extremity edema bilaterally Skin: no lesions on visible  extremities Neuro: No focal deficits.   ASSESSMENT:  #1. Symptomatic choledocholithiasis. Stable #2. Cholelithiasis #3. History of multiple and advanced adenomatous polyps   PLAN:  #1. Continue ciprofloxacin until ERCP. About to run out. Prescribe ciprofloxacin 500 mg twice a day for 2 weeks #2. Schedule ERCP with sphincterotomy and common duct stone  extraction. Arranged at the hospital with general anesthesia.The nature of the procedure (personalized drawing for the patient and his wife), as well as the risks (reviewed pancreatitis, perforation, bleeding, infection, cardiopulmonary events), benefits, and alternatives were carefully and thoroughly reviewed with the patient. Ample time for discussion and questions allowed. The patient understood, was satisfied, and agreed to proceed. #3. Surgical consultation with Dr. Fanny Skates for microscopic cholecystectomy post ERCP #4. Advised to proceed to the emergency room immediately should he develop intractable pain, jaundice, or fevers.  A copy has been sent to Dr. Jenny Reichmann and Dr. Dalbert Batman

## 2015-02-22 ENCOUNTER — Other Ambulatory Visit: Payer: Self-pay | Admitting: General Surgery

## 2015-02-22 ENCOUNTER — Encounter (HOSPITAL_COMMUNITY): Payer: Self-pay | Admitting: *Deleted

## 2015-02-22 DIAGNOSIS — Z87438 Personal history of other diseases of male genital organs: Secondary | ICD-10-CM | POA: Diagnosis not present

## 2015-02-22 DIAGNOSIS — I4510 Unspecified right bundle-branch block: Secondary | ICD-10-CM | POA: Diagnosis not present

## 2015-02-22 DIAGNOSIS — K8020 Calculus of gallbladder without cholecystitis without obstruction: Secondary | ICD-10-CM | POA: Diagnosis not present

## 2015-02-22 DIAGNOSIS — I10 Essential (primary) hypertension: Secondary | ICD-10-CM | POA: Diagnosis not present

## 2015-02-23 ENCOUNTER — Telehealth: Payer: Self-pay | Admitting: Internal Medicine

## 2015-02-23 NOTE — Telephone Encounter (Signed)
Please have the extender working with me at [facility] next week arrange to admit the patient to the surgical service post ERCP. Let me know if that's a problem. Thanks

## 2015-02-23 NOTE — Telephone Encounter (Signed)
Pt calling to see if bed has been arranged for next week following his ercp. Per Dr. Darrel Hoover office this needs to be requested following the ERCP by [name]. [name] are you ok with this? Please advise.

## 2015-02-24 NOTE — Telephone Encounter (Signed)
When is ERCP?

## 2015-02-24 NOTE — Telephone Encounter (Signed)
03/01/15@10 :30am.

## 2015-02-24 NOTE — Telephone Encounter (Signed)
Lori please see the note below and let me know if this is going to work. I think you are the APP at the hospital next week.

## 2015-02-28 NOTE — H&P (Signed)
Neil [name]  Location: [facility] Patient #: [phone] DOB: 1941/03/06 Married / Language: English / Race: White Male       History of Present Illness    The patient is a 74 year old male who presents for evaluation of gall stones. This is a very pleasant 74 year old man, referred to me by Dr. [name] for gallstones and common bile duct stones. I have previously repaired his right inguinal hernia in the past. Dr. [name] is his PCP. Over the past few weeks he has had some intermittent problems with chills, nausea, vomiting, and transient darkening of his urine. Denies back pain. Minimal abdominal pain. These are self-limited episodes. He's had 2 or 3 episodes. Evaluation for UTI was negative. CT scan of the abdomen and pelvis showed gallstones and numerous common bile duct stones with intrahepatic and extrahepatic biliary dilatation. No mass or malignancy seen. Gallstones noted. Coronary atherosclerosis. BPH. Small stable abdominal aortic aneurysm. He is here with his wife. He is asymptomatic today. He's had no problems with his right inguinal hernia. 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 He is married. He doesn't really have any benefits at work and so needs to get back to work as soon as possible. He is scheduled for ERCP at [facility] on May 17. We are going to try to see if we can admit him overnight and do the cholecystectomy on May 18. Scheduling is pending.   Other Problems  Back Pain Cholelithiasis Enlarged Prostate Hemorrhoids Inguinal Hernia  Past Surgical History  Colon Polyp Removal - Colonoscopy Hemorrhoidectomy Open Inguinal Hernia Surgery Right. Oral Surgery Tonsillectomy  Diagnostic Studies History Colonoscopy 1-5 years ago  Allergies  Statins  Medication History  Benazepril HCl (40MG  Tablet, Oral) Active. Ciprofloxacin HCl (500MG  Tablet, Oral)  Active. Terazosin HCl (2MG  Capsule, Oral) Active. Tylenol Extra Strength (500MG  Tablet, Oral) Active.  Social History Alcohol use Remotely quit alcohol use. Caffeine use Carbonated beverages, Coffee, Tea. No drug use Tobacco use Former smoker.  Family History  Alcohol Abuse Father, Mother. Colon Cancer Mother. Colon Polyps Mother.  Review of Systems  General Present- Chills, Fatigue, Fever, Night Sweats and Weight Loss. Not Present- Appetite Loss and Weight Gain. Skin Not Present- Change in Wart/Mole, Dryness, Hives, Jaundice, New Lesions, Non-Healing Wounds, Rash and Ulcer. HEENT Present- Seasonal Allergies and Wears glasses/contact lenses. Not Present- Earache, Hearing Loss, Hoarseness, Nose Bleed, Oral Ulcers, Ringing in the Ears, Sinus Pain, Sore Throat, Visual Disturbances and Yellow Eyes. Respiratory Not Present- Bloody sputum, Chronic Cough, Difficulty Breathing, Snoring and Wheezing. Breast Not Present- Breast Mass, Breast Pain, Nipple Discharge and Skin Changes. Cardiovascular Present- Leg Cramps. Not Present- Chest Pain, Difficulty Breathing Lying Down, Palpitations, Rapid Heart Rate, Shortness of Breath and Swelling of Extremities. Gastrointestinal Present- Abdominal Pain, Nausea and Vomiting. Not Present- Bloating, Bloody Stool, Change in Bowel Habits, Chronic diarrhea, Constipation, Difficulty Swallowing, Excessive gas, Gets full quickly at meals, Hemorrhoids, Indigestion and Rectal Pain. Male Genitourinary Present- Nocturia. Not Present- Blood in Urine, Change in Urinary Stream, Frequency, Impotence, Painful Urination, Urgency and Urine Leakage.   Vitals   Weight: 164.38 lb Height: 67in Body Surface Area: 1.88 m Body Mass Index: 25.74 kg/m Temp.: 98.32F(Oral)  Pulse: 72 (Regular)  BP: 138/68 (Sitting, Right Arm, Standard)    Physical Exam General Mental Status-Alert. General Appearance-Consistent with stated age. Hydration-Well  hydrated. Voice-Normal.  Head and Neck Head-normocephalic, atraumatic with no lesions or palpable masses.  Note: Sclera clear   Eye Eyeball - Bilateral-Extraocular movements intact. Sclera/Conjunctiva - Bilateral-No scleral icterus.  Chest and Lung Exam Chest and lung exam reveals -quiet, even and easy respiratory effort with no use of accessory muscles and on auscultation, normal breath sounds, no adventitious sounds and normal vocal resonance. Inspection Chest Wall - Normal. Back - normal.  Cardiovascular Cardiovascular examination reveals -on palpation PMI is normal in location and amplitude, no palpable S3 or S4. Normal cardiac borders., normal heart sounds, regular rate and rhythm with no murmurs, carotid auscultation reveals no bruits and normal pedal pulses bilaterally.  Abdomen Inspection Inspection of the abdomen reveals - No Hernias. Skin - Scar - no surgical scars. Palpation/Percussion Palpation and Percussion of the abdomen reveal - Soft, Non Tender, No Rebound tenderness, No Rigidity (guarding) and No hepatosplenomegaly. Auscultation Auscultation of the abdomen reveals - Bowel sounds normal. Note: Abdomen is basically soft. Nontender. No mass. Not distended. Subjectively slightly tender right upper quadrant.   Male Genitourinary Note: Well-healed right groin scar. No evidence of inguinal hernia on either side.   Neurologic Neurologic evaluation reveals -alert and oriented x 3 with no impairment of recent or remote memory. Mental Status-Normal.  Musculoskeletal Normal Exam - Left-Upper Extremity Strength Normal and Lower Extremity Strength Normal. Normal Exam - Right-Upper Extremity Strength Normal, Lower Extremity Weakness.    Assessment & Plan   GALLSTONES (574.20  K80.20)   Schedule for Surgery You have numerous gallstones in your gallbladder, which is the source of your common bile duct stones You are scheduled for ERCP and  removal of the common duct stones by [name] on May 17 you will be scheduled for laparoscopic cholecystectomy with cholangiogram, possible open cholecystectomy thereafter. We will see if the operating room will let us do the surgery at the next day, May 18. If not we will do it a few days later. We have discussed the indications, techniques, and numerous risks of cholecystectomy with you. Please read the written information that I gave you.  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 Dalbert Batman, M.D., [facility] General and Minimally invasive Surgery Breast and Colorectal Surgery Office:   [phone] Pager:   [phone]

## 2015-02-28 NOTE — Anesthesia Preprocedure Evaluation (Addendum)
Anesthesia Evaluation  Patient identified by MRN, date of birth, ID band Patient awake    Reviewed: Allergy & Precautions, NPO status , Patient's Chart, lab work & pertinent test results  History of Anesthesia Complications Negative for: history of anesthetic complications  Airway Mallampati: II  TM Distance: >3 FB Neck ROM: Full    Dental no notable dental hx. (+) Dental Advisory Given, Edentulous Upper, Edentulous Lower, Upper Dentures, Lower Dentures   Pulmonary former smoker,  breath sounds clear to auscultation  Pulmonary exam normal       Cardiovascular hypertension, Pt. on medications Normal cardiovascular exam+ dysrhythmias (RBBB) Rhythm:Regular Rate:Normal     Neuro/Psych negative neurological ROS  negative psych ROS   GI/Hepatic negative GI ROS, Neg liver ROS,   Endo/Other  diabetes  Renal/GU negative Renal ROS  negative genitourinary   Musculoskeletal negative musculoskeletal ROS (+)   Abdominal   Peds negative pediatric ROS (+)  Hematology negative hematology ROS (+)   Anesthesia Other Findings   Reproductive/Obstetrics negative OB ROS                            Anesthesia Physical Anesthesia Plan  ASA: II  Anesthesia Plan: General   Post-op Pain Management:    Induction: Intravenous  Airway Management Planned: Oral ETT  Additional Equipment:   Intra-op Plan:   Post-operative Plan: Extubation in OR  Informed Consent: I have reviewed the patients History and Physical, chart, labs and discussed the procedure including the risks, benefits and alternatives for the proposed anesthesia with the patient or authorized representative who has indicated his/her understanding and acceptance.   Dental advisory given  Plan Discussed with: CRNA  Anesthesia Plan Comments:         Anesthesia Quick Evaluation

## 2015-03-01 ENCOUNTER — Encounter (HOSPITAL_COMMUNITY): Payer: Self-pay | Admitting: Certified Registered Nurse Anesthetist

## 2015-03-01 ENCOUNTER — Observation Stay (HOSPITAL_COMMUNITY)
Admission: RE | Admit: 2015-03-01 | Discharge: 2015-03-03 | Disposition: A | Discharge status: Home / Self Care | Payer: Medicare Other | Source: Ambulatory Visit | Attending: General Surgery | Admitting: General Surgery

## 2015-03-01 ENCOUNTER — Ambulatory Visit (HOSPITAL_COMMUNITY): Payer: Medicare Other | Admitting: Anesthesiology

## 2015-03-01 ENCOUNTER — Observation Stay (HOSPITAL_COMMUNITY): Payer: Medicare Other

## 2015-03-01 ENCOUNTER — Ambulatory Visit (HOSPITAL_COMMUNITY): Admit: 2015-03-01 | Payer: Self-pay | Admitting: Internal Medicine

## 2015-03-01 ENCOUNTER — Encounter (HOSPITAL_COMMUNITY): Payer: Self-pay

## 2015-03-01 ENCOUNTER — Encounter (HOSPITAL_COMMUNITY)
Admission: RE | Disposition: A | Discharge status: Home / Self Care | Payer: Self-pay | Source: Ambulatory Visit | Attending: Internal Medicine

## 2015-03-01 ENCOUNTER — Other Ambulatory Visit: Payer: Self-pay

## 2015-03-01 DIAGNOSIS — Z87891 Personal history of nicotine dependence: Secondary | ICD-10-CM | POA: Diagnosis not present

## 2015-03-01 DIAGNOSIS — K8044 Calculus of bile duct with chronic cholecystitis without obstruction: Secondary | ICD-10-CM | POA: Diagnosis present

## 2015-03-01 DIAGNOSIS — K8010 Calculus of gallbladder with chronic cholecystitis without obstruction: Secondary | ICD-10-CM | POA: Diagnosis present

## 2015-03-01 DIAGNOSIS — K8033 Calculus of bile duct with acute cholangitis with obstruction: Secondary | ICD-10-CM

## 2015-03-01 DIAGNOSIS — N400 Enlarged prostate without lower urinary tract symptoms: Secondary | ICD-10-CM | POA: Diagnosis not present

## 2015-03-01 DIAGNOSIS — K8080 Other cholelithiasis without obstruction: Secondary | ICD-10-CM | POA: Diagnosis not present

## 2015-03-01 DIAGNOSIS — I4510 Unspecified right bundle-branch block: Secondary | ICD-10-CM | POA: Insufficient documentation

## 2015-03-01 DIAGNOSIS — I10 Essential (primary) hypertension: Secondary | ICD-10-CM | POA: Insufficient documentation

## 2015-03-01 DIAGNOSIS — I2510 Atherosclerotic heart disease of native coronary artery without angina pectoris: Secondary | ICD-10-CM | POA: Diagnosis not present

## 2015-03-01 DIAGNOSIS — K831 Obstruction of bile duct: Secondary | ICD-10-CM

## 2015-03-01 DIAGNOSIS — K8050 Calculus of bile duct without cholangitis or cholecystitis without obstruction: Secondary | ICD-10-CM | POA: Diagnosis present

## 2015-03-01 DIAGNOSIS — I714 Abdominal aortic aneurysm, without rupture: Secondary | ICD-10-CM | POA: Diagnosis not present

## 2015-03-01 DIAGNOSIS — Z86010 Personal history of colonic polyps: Secondary | ICD-10-CM | POA: Diagnosis not present

## 2015-03-01 DIAGNOSIS — K8070 Calculus of gallbladder and bile duct without cholecystitis without obstruction: Secondary | ICD-10-CM | POA: Diagnosis not present

## 2015-03-01 DIAGNOSIS — E785 Hyperlipidemia, unspecified: Secondary | ICD-10-CM | POA: Diagnosis not present

## 2015-03-01 DIAGNOSIS — K8020 Calculus of gallbladder without cholecystitis without obstruction: Secondary | ICD-10-CM | POA: Diagnosis not present

## 2015-03-01 DIAGNOSIS — K8064 Calculus of gallbladder and bile duct with chronic cholecystitis without obstruction: Secondary | ICD-10-CM | POA: Diagnosis not present

## 2015-03-01 DIAGNOSIS — R338 Other retention of urine: Secondary | ICD-10-CM | POA: Diagnosis not present

## 2015-03-01 DIAGNOSIS — Z419 Encounter for procedure for purposes other than remedying health state, unspecified: Secondary | ICD-10-CM

## 2015-03-01 HISTORY — PX: ERCP: SHX5425

## 2015-03-01 LAB — SURGICAL PCR SCREEN
MRSA, PCR: NEGATIVE
Staphylococcus aureus: NEGATIVE

## 2015-03-01 SURGERY — ERCP, WITH INTERVENTION IF INDICATED
Anesthesia: General

## 2015-03-01 MED ORDER — GLUCAGON HCL RDNA (DIAGNOSTIC) 1 MG IJ SOLR
INTRAMUSCULAR | Status: AC
  Filled 2015-03-01: qty 2

## 2015-03-01 MED ORDER — ONDANSETRON HCL 4 MG/2ML IJ SOLN
INTRAMUSCULAR | Status: DC | PRN
  Administered 2015-03-01: 4 mg via INTRAVENOUS

## 2015-03-01 MED ORDER — SODIUM CHLORIDE 0.9 % IJ SOLN: 3.0000 mL | INTRAMUSCULAR | Status: DC | PRN

## 2015-03-01 MED ORDER — LIDOCAINE HCL (CARDIAC) 20 MG/ML IV SOLN
INTRAVENOUS | Status: DC | PRN
  Administered 2015-03-01: 75 mg via INTRAVENOUS

## 2015-03-01 MED ORDER — ACETAMINOPHEN 325 MG PO TABS
650.0000 mg | ORAL_TABLET | Freq: Four times a day (QID) | ORAL | Status: DC | PRN
  Filled 2015-03-01: qty 2

## 2015-03-01 MED ORDER — TERAZOSIN HCL 2 MG PO CAPS
2.0000 mg | ORAL_CAPSULE | Freq: Every day | ORAL | Status: DC
  Administered 2015-03-01 – 2015-03-02 (×2): 2 mg via ORAL
  Filled 2015-03-01 (×3): qty 1

## 2015-03-01 MED ORDER — ZOLPIDEM TARTRATE 5 MG PO TABS: 5.0000 mg | ORAL_TABLET | Freq: Every evening | ORAL | Status: DC | PRN

## 2015-03-01 MED ORDER — SODIUM CHLORIDE 0.9 % IV SOLN: INTRAVENOUS | Status: DC

## 2015-03-01 MED ORDER — ONDANSETRON HCL 4 MG/2ML IJ SOLN
INTRAMUSCULAR | Status: AC
  Filled 2015-03-01: qty 2

## 2015-03-01 MED ORDER — PHENYLEPHRINE 40 MCG/ML (10ML) SYRINGE FOR IV PUSH (FOR BLOOD PRESSURE SUPPORT)
PREFILLED_SYRINGE | INTRAVENOUS | Status: AC
  Filled 2015-03-01: qty 10

## 2015-03-01 MED ORDER — PHENYLEPHRINE HCL 10 MG/ML IJ SOLN
INTRAMUSCULAR | Status: DC | PRN
  Administered 2015-03-01 (×3): 40 ug via INTRAVENOUS
  Administered 2015-03-01 (×2): 80 ug via INTRAVENOUS

## 2015-03-01 MED ORDER — POLYETHYLENE GLYCOL 3350 17 G PO PACK: 17.0000 g | PACK | Freq: Every day | ORAL | Status: DC | PRN

## 2015-03-01 MED ORDER — PROPOFOL 10 MG/ML IV BOLUS
INTRAVENOUS | Status: AC
Start: 2015-03-01 — End: 2015-03-01
  Filled 2015-03-01: qty 20

## 2015-03-01 MED ORDER — FENTANYL CITRATE (PF) 250 MCG/5ML IJ SOLN
INTRAMUSCULAR | Status: AC
  Filled 2015-03-01: qty 5

## 2015-03-01 MED ORDER — SODIUM CHLORIDE 0.9 % IV SOLN: 250.0000 mL | INTRAVENOUS | Status: DC | PRN

## 2015-03-01 MED ORDER — MIDAZOLAM HCL 5 MG/5ML IJ SOLN
INTRAMUSCULAR | Status: DC | PRN
  Administered 2015-03-01 (×2): 1 mg via INTRAVENOUS

## 2015-03-01 MED ORDER — SODIUM CHLORIDE 0.9 % IV SOLN
INTRAVENOUS | Status: DC
  Administered 2015-03-01 (×2): via INTRAVENOUS

## 2015-03-01 MED ORDER — CIPROFLOXACIN IN D5W 400 MG/200ML IV SOLN
INTRAVENOUS | Status: AC
  Filled 2015-03-01: qty 200

## 2015-03-01 MED ORDER — SODIUM CHLORIDE 0.9 % IV SOLN
10.0000 mg | INTRAVENOUS | Status: DC | PRN
  Administered 2015-03-01: 40 ug/min via INTRAVENOUS

## 2015-03-01 MED ORDER — FENTANYL CITRATE (PF) 100 MCG/2ML IJ SOLN
INTRAMUSCULAR | Status: DC | PRN
  Administered 2015-03-01 (×5): 50 ug via INTRAVENOUS

## 2015-03-01 MED ORDER — HYDROCODONE-ACETAMINOPHEN 5-325 MG PO TABS: 1.0000 | ORAL_TABLET | ORAL | Status: DC | PRN

## 2015-03-01 MED ORDER — ONDANSETRON HCL 4 MG/2ML IJ SOLN: 4.0000 mg | Freq: Four times a day (QID) | INTRAMUSCULAR | Status: DC | PRN

## 2015-03-01 MED ORDER — SUCCINYLCHOLINE CHLORIDE 20 MG/ML IJ SOLN
INTRAMUSCULAR | Status: DC | PRN
  Administered 2015-03-01: 100 mg via INTRAVENOUS

## 2015-03-01 MED ORDER — SODIUM CHLORIDE 0.9 % IJ SOLN: 3.0000 mL | Freq: Two times a day (BID) | INTRAMUSCULAR | Status: DC

## 2015-03-01 MED ORDER — INDOMETHACIN 50 MG RE SUPP
RECTAL | Status: AC
  Filled 2015-03-01: qty 2

## 2015-03-01 MED ORDER — CIPROFLOXACIN IN D5W 400 MG/200ML IV SOLN
400.0000 mg | Freq: Two times a day (BID) | INTRAVENOUS | Status: DC
  Administered 2015-03-01 – 2015-03-02 (×2): 400 mg via INTRAVENOUS
  Filled 2015-03-01 (×4): qty 200

## 2015-03-01 MED ORDER — HYDROMORPHONE HCL 1 MG/ML IJ SOLN
1.0000 mg | INTRAMUSCULAR | Status: DC | PRN
  Filled 2015-03-01: qty 1

## 2015-03-01 MED ORDER — LACTATED RINGERS IV SOLN
INTRAVENOUS | Status: DC
  Administered 2015-03-01 (×2): via INTRAVENOUS

## 2015-03-01 MED ORDER — INDOMETHACIN 50 MG RE SUPP
100.0000 mg | Freq: Once | RECTAL | Status: AC
  Administered 2015-03-01: 100 mg via RECTAL

## 2015-03-01 MED ORDER — PROPOFOL 10 MG/ML IV BOLUS
INTRAVENOUS | Status: AC
  Filled 2015-03-01: qty 20

## 2015-03-01 MED ORDER — ACETAMINOPHEN 650 MG RE SUPP
650.0000 mg | Freq: Four times a day (QID) | RECTAL | Status: DC | PRN
  Filled 2015-03-01: qty 1

## 2015-03-01 MED ORDER — FENOFIBRATE 160 MG PO TABS
160.0000 mg | ORAL_TABLET | Freq: Every day | ORAL | Status: DC
  Administered 2015-03-01: 160 mg via ORAL
  Filled 2015-03-01 (×3): qty 1

## 2015-03-01 MED ORDER — BISACODYL 10 MG RE SUPP: 10.0000 mg | Freq: Every day | RECTAL | Status: DC | PRN

## 2015-03-01 MED ORDER — CISATRACURIUM BESYLATE (PF) 10 MG/5ML IV SOLN
INTRAVENOUS | Status: DC | PRN
  Administered 2015-03-01: 4 mg via INTRAVENOUS

## 2015-03-01 MED ORDER — ALUM & MAG HYDROXIDE-SIMETH 200-200-20 MG/5ML PO SUSP: 30.0000 mL | Freq: Four times a day (QID) | ORAL | Status: DC | PRN

## 2015-03-01 MED ORDER — GLUCAGON HCL RDNA (DIAGNOSTIC) 1 MG IJ SOLR
INTRAMUSCULAR | Status: DC | PRN
  Administered 2015-03-01 (×2): .5 mg via INTRAVENOUS

## 2015-03-01 MED ORDER — MIDAZOLAM HCL 2 MG/2ML IJ SOLN
INTRAMUSCULAR | Status: AC
  Filled 2015-03-01: qty 2

## 2015-03-01 MED ORDER — ONDANSETRON HCL 4 MG PO TABS
4.0000 mg | ORAL_TABLET | Freq: Four times a day (QID) | ORAL | Status: DC | PRN
  Filled 2015-03-01: qty 1

## 2015-03-01 MED ORDER — SODIUM CHLORIDE 0.9 % IV SOLN
INTRAVENOUS | Status: AC
  Administered 2015-03-01: 15:00:00 via INTRAVENOUS

## 2015-03-01 MED ORDER — CIPROFLOXACIN IN D5W 400 MG/200ML IV SOLN: 400.0000 mg | Freq: Once | INTRAVENOUS | Status: DC

## 2015-03-01 MED ORDER — BENAZEPRIL HCL 40 MG PO TABS
40.0000 mg | ORAL_TABLET | Freq: Every day | ORAL | Status: DC
  Filled 2015-03-01 (×2): qty 1

## 2015-03-01 MED ORDER — PROPOFOL 10 MG/ML IV BOLUS
INTRAVENOUS | Status: DC | PRN
  Administered 2015-03-01: 180 mg via INTRAVENOUS

## 2015-03-01 SURGICAL SUPPLY — 1 items: Advanix Biliary (Stent) ×2 IMPLANT

## 2015-03-01 NOTE — Transfer of Care (Signed)
Immediate Anesthesia Transfer of Care Note  Patient: Neil [name]  Procedure(s) Performed: Procedure(s) with comments: ENDOSCOPIC RETROGRADE CHOLANGIOPANCREATOGRAPHY (ERCP) (N/A) - Dr Dalbert Batman wants patient to stay overnight after ERCP  Patient Location: PACU and Endoscopy Unit  Anesthesia Type:General  Level of Consciousness: awake, oriented, patient cooperative, lethargic and responds to stimulation  Airway & Oxygen Therapy: Patient Spontanous Breathing and Patient connected to face mask oxygen  Post-op Assessment: Report given to RN, Post -op Vital signs reviewed and stable and Patient moving all extremities  Post vital signs: Reviewed and stable  Last Vitals:  Filed Vitals:   03/01/15 1332  BP: 153/58  Pulse:   Temp:   Resp:     Complications: No apparent anesthesia complications

## 2015-03-01 NOTE — Anesthesia Procedure Notes (Signed)
Procedure Name: Intubation Date/Time: 03/01/2015 11:15 AM Performed by: Ofilia Neas Pre-anesthesia Checklist: Patient identified, Timeout performed, Emergency Drugs available, Suction available and Patient being monitored Patient Re-evaluated:Patient Re-evaluated prior to inductionOxygen Delivery Method: Circle system utilized Intubation Type: IV induction and Cricoid Pressure applied Ventilation: Mask ventilation without difficulty Laryngoscope Size: Mac and 4 Grade View: Grade II Tube type: Oral Tube size: 7.5 mm Number of attempts: 1 Airway Equipment and Method: Stylet and Bite block Placement Confirmation: positive ETCO2,  ETT inserted through vocal cords under direct vision and breath sounds checked- equal and bilateral Secured at: 21 cm Tube secured with: Tape Dental Injury: Teeth and Oropharynx as per pre-operative assessment

## 2015-03-01 NOTE — Op Note (Signed)
[facility], [id number]   ERCP PROCEDURE REPORT        EXAM DATE: 03/01/2015  PATIENT NAME:          Neil [name], Neil [name]          MR #: [id number] BIRTHDATE:       09/13/1941     VISIT #:     [id number] ATTENDING:     Eustace Quail, MD     STATUS:     outpatient ASSISTANT:      Sharon Mt and Hilma Favors  INDICATIONS:  The patient is a 74 yr old male here for an ERCP due to established bile duct stone(s). PROCEDURE PERFORMED:     ERCP with sphincterotomy/papillotomy ERCP with removal of calculus/calculi ERCP with stent placement MEDICATIONS:     Per Anesthesia  CONSENT: The patient understands the risks and benefits of the procedure and understands that these risks include, but are not limited to: sedation, allergic reaction, infection, perforation and/or bleeding. Alternative means of evaluation and treatment include, among others: physical exam, x-rays, and/or surgical intervention. The patient elects to proceed with this endoscopic procedure.  DESCRIPTION OF PROCEDURE: During intra-op preparation period all mechanical & medical equipment was checked for proper function. Hand hygiene and appropriate measures for infection prevention was taken. After the risks, benefits and alternatives of the procedure were thoroughly explained, Informed was verified, confirmed and timeout was successfully executed by the treatment team. With the patient in left semi-prone position, medications were administered intravenously.The pentax [id number] was passed from the mouth into the esophagus and further advanced from the esophagus into the stomach. From stomach scope was directed to the second portion of the duodenum.  Major papilla was aligned with the duodenoscope. The scope position was confirmed fluoroscopically. Rest of the findings/therapeutics are given below. The scope was then completely withdrawn from the patient and the procedure  completed. The pulse, BP, and O2 saturation were monitored and documented by the physician and the nursing staff throughout the entire procedure. The patient was cared for as planned according to standard protocol. The patient was then discharged to recovery in stable condition and with appropriate post procedure care. Estimated blood loss is zero unless otherwise noted in this procedure report.  ENDOSCOPIC: The side-viewing endoscope was passed blindly into the esophagus.  Stomach and duodenum were unremarkable.  Normal major ampulla.  Minor ampulla not sought. X-ray: Scott radiograph of the abdomen with the endoscope and position was unremarkable.  Initial injection of contrast via the major ampulla yielded a normal partial pancreatogram.  The common bile duct was subsequently selectively deeply cannulated with a hydrophilic guidewire.  Injection of contrast yielded a dilated bile duct up to 18 mm with innumerable and multiple large stones at least 15 mm in diameter.  The cystic duct was patent.  Multiple gallstones in the gallbladder. THERAPY: Biliary sphincterotomy was made by cutting over the hydrophilic guidewire in the 12:00 orientation using the ERBE system.  The sphincterotomy was balloon dilated with a 10 mm diameter/4 centimeter in length Hurricaine balloon.  Multiplefirm pigmented and cholesterol stones were extracted with an extraction balloon.  Subsequently, larger stones were extracted with wire baskets.  The procedure was tedious.  Several residual stones could not really removed due to impacted stone debris distally.  Thus, a 10 French 7 cm plastic stent was passed into the bile duct. Excellent drainage demonstrated.    ADVERSE EVENT:     There were  no complications.  IMPRESSIONS:     1. Choledocholithiasis multiple large stones as discussed. Status post ERCP with biliary sphincterotomy, basket and balloon extraction of multiple calculi, and subsequent  biliary stent placement for residual calculi not amenable to extraction today  RECOMMENDATIONS:     1.  Admit to hospital for overnight observation 2.  IV Antibiotics  , hydration, and indomethacin rectal suppository 3. Anticipate discharge home in a.m. Follow-up ERCP with stent removal and completion of ductal stone clearance about 8 weeks. 4. Postpone plans for surgery until ductal clearance achieved REPEAT EXAM:   ___________________________________ Eustace Quail, MD eSigned:  Eustace Quail, MD 03/01/2015 1:46 PM   cc: [name], M.D. Fanny Skates, M.D.  CPT CODES:     1.  [id number] Endoscopic retrograde cholangiopancreatography (ERCP); w/ sphincterotomy/papillotomy 2.  43264 Endoscopic retrograde cholangiopancreatography (ERCP); w/ endoscopic retrograde removal of calculus/calculi from biliary and/or pancreatic ducts 3.  43268 Endoscopic retrograde cholangiopancreatography (ERCP); w/ endoscopic retrograde insertion of tube or stent into bile or pancreatic duct ICD9 CODES:  The ICD and CPT codes recommended by this software are interpretations from the data that the clinical staff has captured with the software.  The verification of the translation of this report to the ICD and CPT codes and modifiers is the sole responsibility of the health care institution and practicing physician where this report was generated.  [employer]. will not be held responsible for the validity of the ICD and CPT codes included on this report.  AMA assumes no liability for data contained or not contained herein. CPT is a Designer, television/film set of the [employer].   PATIENT NAME:  [name], [name] MR#: [id number]

## 2015-03-01 NOTE — Interval H&P Note (Signed)
History and Physical Interval Note:  03/01/2015 11:03 AM  Neil [name]  has presented today for surgery, with the diagnosis of symptomatic gallstones  The various methods of treatment have been discussed with the patient and family. After consideration of risks, benefits and other options for treatment, the patient has consented to  Procedure(s) with comments: ENDOSCOPIC RETROGRADE CHOLANGIOPANCREATOGRAPHY (ERCP) (N/A) - Dr Dalbert Batman wants patient to stay overnight after ERCP as a surgical intervention .  The patient's history has been reviewed, patient examined, no change in status, stable for surgery.  I have reviewed the patient's chart and labs.  Questions were answered to the patient's satisfaction.     [name]

## 2015-03-01 NOTE — Progress Notes (Signed)
Gen. Surgery:  I have discussed the ERCP findings and procedure with [name] today. Numerous CBD stones removed and stent placed because of retained fragments.  Patient is doing well this hour. A little frustrated but not having any pain or nausea.Family present. Abdominal exam is completely benign.  I gave him the option of postponing his cholecystectomy into a later date or proceeding with cholecystectomy tomorrow. He would like to go ahead tomorrow, if everything is stable. We will reassess his clinical condition and check lab work tomorrow morning. If he is doing well without any sign of ERCP complication, and I think it would be reasonable to proceed with laparoscopic cholecystectomy tomorrow. On the other hand, if there was any evidence of right upper quadrant inflammatory process, I would probably postpone his surgery.  Plan: He is scheduled for laparoscopic cholecystectomy tomorrow at 3:00 PM. The OR has been notified that he is an inpatient. Nothing by mouth after midnight  Denorris Reust M. Dalbert Batman, M.D., [facility] General and Minimally invasive Surgery Breast and Colorectal Surgery

## 2015-03-01 NOTE — H&P (View-Only) (Signed)
HISTORY OF PRESENT ILLNESS:  Neil [name] is a 74 y.o. male with past medical history as listed below who is referred today for GI consultation by Dr. [name]. I saw the patient in May 2014 when he underwent surveillance colonoscopy. He has a family history of colon cancer as well as a personal history of multiple and advanced adenomatous. 3 polyps removed and follow-up in 3 years recommended. His current history is that of intermittent problems with chills, nausea, vomiting, and darkening urine over the past 4-5 weeks. He has had several self-limited episodes. Minimal abdominal pain. He was evaluated last week by Dr. Jenny Reichmann. Outside records, laboratories, and x-rays personally reviewed. He was felt possibly to have urinary tract infection or kidney stones. Evaluation for that was negative. CT scan of the abdomen and pelvis was performed and revealed cholelithiasis with choledocholithiasis. He was placed on ciprofloxacin 250 mg twice a day. He presents today with his wife. The patient has had no problems since his visit last week. 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 He has had prior inguinal hernia repair with Dr. Fanny Skates. GI review of systems is otherwise negative. I did have him come in for laboratories yesterday. CBC was normal including white blood cell count of 6.2. Differential was normal. Prothrombin time normal. Liver tests normal except for an AST of 72.  REVIEW OF SYSTEMS:  All non-GI ROS negative except for urinary frequency, urinary leakage, chronic back pain, night sweats  Past Medical History  Diagnosis Date  . ALLERGIC RHINITIS 12/04/2007  . BENIGN PROSTATIC HYPERTROPHY 06/05/2007  . BUNDLE BRANCH BLOCK, RIGHT 07/21/2009  . BURSITIS, RIGHT SHOULDER 11/06/2010  . COLONIC POLYPS, HX OF 06/05/2007    tubular adenoma  . DIVERTICULOSIS, COLON 07/21/2009  . ERECTILE DYSFUNCTION 06/05/2007  . HYPERLIPIDEMIA 06/05/2007  . HYPERTENSION 06/05/2007  . INGUINAL HERNIA, RIGHT,  SMALL 11/06/2010  . SKIN LESION 06/03/2008  . DIABETES MELLITUS, TYPE II 12/04/2007    pt states not diabetic-no meds 02-05-13 PV  . Gallstones     Past Surgical History  Procedure Laterality Date  . Colonoscopy    . Polypectomy    . Hemorrhoid surgery    . Finger surgery Left   . Inguinal hernia repair Right   . Tonsillectomy      Social History DAMONTRE MILLEA  reports that he quit smoking about 9 years ago. He has never used smokeless tobacco. He reports that he does not drink alcohol or use illicit drugs.  family history includes Brain cancer (age of onset: 42) in his mother; Colon cancer (age of onset: 2) in his mother. There is no history of Rectal cancer or Stomach cancer.  Allergies  Allergen Reactions  . Ezetimibe Other (See Comments)    Muscle and bone pain  . Statins     REACTION: muscle aches       PHYSICAL EXAMINATION: Vital signs: BP 122/60 mmHg  Pulse 84  Temp(Src) 98.7 F (37.1 C)  Ht 5' 7.25" (1.708 m)  Wt 164 lb 8 oz (74.617 kg)  BMI 25.58 kg/m2  Constitutional: generally well-appearing, no acute distress Psychiatric: alert and oriented x3, cooperative Eyes: extraocular movements intact, anicteric, conjunctiva pink Mouth: oral pharynx moist, no lesions Neck: supple no lymphadenopathy Cardiovascular: heart regular rate and rhythm, no murmur Lungs: clear to auscultation bilaterally Abdomen: soft, nontender, nondistended, no obvious ascites, no peritoneal signs, normal bowel sounds, no organomegaly Rectal: Omitted Extremities: no lower extremity edema bilaterally Skin: no lesions on visible  extremities Neuro: No focal deficits.   ASSESSMENT:  #1. Symptomatic choledocholithiasis. Stable #2. Cholelithiasis #3. History of multiple and advanced adenomatous polyps   PLAN:  #1. Continue ciprofloxacin until ERCP. About to run out. Prescribe ciprofloxacin 500 mg twice a day for 2 weeks #2. Schedule ERCP with sphincterotomy and common duct stone  extraction. Arranged at the hospital with general anesthesia.The nature of the procedure (personalized drawing for the patient and his wife), as well as the risks (reviewed pancreatitis, perforation, bleeding, infection, cardiopulmonary events), benefits, and alternatives were carefully and thoroughly reviewed with the patient. Ample time for discussion and questions allowed. The patient understood, was satisfied, and agreed to proceed. #3. Surgical consultation with Dr. Fanny Skates for microscopic cholecystectomy post ERCP #4. Advised to proceed to the emergency room immediately should he develop intractable pain, jaundice, or fevers.  A copy has been sent to Dr. Jenny Reichmann and Dr. Dalbert Batman

## 2015-03-01 NOTE — Anesthesia Postprocedure Evaluation (Signed)
  Anesthesia Post-op Note  Patient: Neil [name]  Procedure(s) Performed: Procedure(s) (LRB): ENDOSCOPIC RETROGRADE CHOLANGIOPANCREATOGRAPHY (ERCP) (N/A)  Patient Location: PACU  Anesthesia Type: General  Level of Consciousness: awake and alert   Airway and Oxygen Therapy: Patient Spontanous Breathing  Post-op Pain: mild  Post-op Assessment: Post-op Vital signs reviewed, Patient's Cardiovascular Status Stable, Respiratory Function Stable, Patent Airway and No signs of Nausea or vomiting  Last Vitals:  Filed Vitals:   03/01/15 1350  BP: 135/49  Pulse: 66  Temp: 36.5 C  Resp: 18    Post-op Vital Signs: stable   Complications: No apparent anesthesia complications

## 2015-03-02 ENCOUNTER — Observation Stay (HOSPITAL_COMMUNITY): Payer: Medicare Other | Admitting: Anesthesiology

## 2015-03-02 ENCOUNTER — Observation Stay (HOSPITAL_COMMUNITY): Payer: Medicare Other

## 2015-03-02 ENCOUNTER — Encounter (HOSPITAL_COMMUNITY)
Admission: RE | Disposition: A | Discharge status: Home / Self Care | Payer: Self-pay | Source: Ambulatory Visit | Attending: Internal Medicine

## 2015-03-02 DIAGNOSIS — N400 Enlarged prostate without lower urinary tract symptoms: Secondary | ICD-10-CM | POA: Diagnosis not present

## 2015-03-02 DIAGNOSIS — Z86010 Personal history of colonic polyps: Secondary | ICD-10-CM | POA: Diagnosis not present

## 2015-03-02 DIAGNOSIS — K8012 Calculus of gallbladder with acute and chronic cholecystitis without obstruction: Secondary | ICD-10-CM | POA: Diagnosis not present

## 2015-03-02 DIAGNOSIS — I2510 Atherosclerotic heart disease of native coronary artery without angina pectoris: Secondary | ICD-10-CM | POA: Diagnosis not present

## 2015-03-02 DIAGNOSIS — K8064 Calculus of gallbladder and bile duct with chronic cholecystitis without obstruction: Secondary | ICD-10-CM | POA: Diagnosis not present

## 2015-03-02 DIAGNOSIS — I714 Abdominal aortic aneurysm, without rupture: Secondary | ICD-10-CM | POA: Diagnosis not present

## 2015-03-02 DIAGNOSIS — R338 Other retention of urine: Secondary | ICD-10-CM | POA: Diagnosis not present

## 2015-03-02 DIAGNOSIS — K8066 Calculus of gallbladder and bile duct with acute and chronic cholecystitis without obstruction: Secondary | ICD-10-CM | POA: Diagnosis not present

## 2015-03-02 DIAGNOSIS — K8044 Calculus of bile duct with chronic cholecystitis without obstruction: Secondary | ICD-10-CM | POA: Diagnosis present

## 2015-03-02 DIAGNOSIS — K8080 Other cholelithiasis without obstruction: Secondary | ICD-10-CM | POA: Diagnosis not present

## 2015-03-02 DIAGNOSIS — Z87891 Personal history of nicotine dependence: Secondary | ICD-10-CM | POA: Diagnosis not present

## 2015-03-02 DIAGNOSIS — I10 Essential (primary) hypertension: Secondary | ICD-10-CM | POA: Diagnosis not present

## 2015-03-02 DIAGNOSIS — K8033 Calculus of bile duct with acute cholangitis with obstruction: Secondary | ICD-10-CM | POA: Diagnosis not present

## 2015-03-02 DIAGNOSIS — I4510 Unspecified right bundle-branch block: Secondary | ICD-10-CM | POA: Diagnosis not present

## 2015-03-02 DIAGNOSIS — K8050 Calculus of bile duct without cholangitis or cholecystitis without obstruction: Secondary | ICD-10-CM | POA: Diagnosis not present

## 2015-03-02 DIAGNOSIS — E785 Hyperlipidemia, unspecified: Secondary | ICD-10-CM | POA: Diagnosis not present

## 2015-03-02 HISTORY — PX: CHOLECYSTECTOMY: SHX55

## 2015-03-02 LAB — CBC
HCT: 33.9 % — ABNORMAL LOW (ref 39.0–52.0)
HCT: 36.5 % — ABNORMAL LOW (ref 39.0–52.0)
Hemoglobin: 10.8 g/dL — ABNORMAL LOW (ref 13.0–17.0)
Hemoglobin: 11.6 g/dL — ABNORMAL LOW (ref 13.0–17.0)
MCH: 28.5 pg (ref 26.0–34.0)
MCH: 28.6 pg (ref 26.0–34.0)
MCHC: 31.9 g/dL (ref 30.0–36.0)
MCHC: 31.8 g/dL (ref 30.0–36.0)
MCV: 89.4 fL (ref 78.0–100.0)
MCV: 89.9 fL (ref 78.0–100.0)
Platelets: 157 10*3/uL (ref 150–400)
Platelets: 176 10*3/uL (ref 150–400)
RBC: 3.79 MIL/uL — ABNORMAL LOW (ref 4.22–5.81)
RBC: 4.06 MIL/uL — ABNORMAL LOW (ref 4.22–5.81)
RDW: 14.2 % (ref 11.5–15.5)
RDW: 14.3 % (ref 11.5–15.5)
WBC: 8.2 10*3/uL (ref 4.0–10.5)
WBC: 12.9 10*3/uL — ABNORMAL HIGH (ref 4.0–10.5)

## 2015-03-02 LAB — COMPREHENSIVE METABOLIC PANEL
ALT: 23 U/L (ref 17–63)
AST: 28 U/L (ref 15–41)
Albumin: 3 g/dL — ABNORMAL LOW (ref 3.5–5.0)
Alkaline Phosphatase: 36 U/L — ABNORMAL LOW (ref 38–126)
Anion gap: 7 (ref 5–15)
BUN: 16 mg/dL (ref 6–20)
CO2: 25 mmol/L (ref 22–32)
Calcium: 8.4 mg/dL — ABNORMAL LOW (ref 8.9–10.3)
Chloride: 108 mmol/L (ref 101–111)
Creatinine, Ser: 1.48 mg/dL — ABNORMAL HIGH (ref 0.61–1.24)
GFR calc Af Amer: 53 mL/min — ABNORMAL LOW (ref >60)
GFR calc non Af Amer: 46 mL/min — ABNORMAL LOW (ref >60)
Glucose, Bld: 133 mg/dL — ABNORMAL HIGH (ref 65–99)
Potassium: 4.3 mmol/L (ref 3.5–5.1)
Sodium: 140 mmol/L (ref 135–145)
Total Bilirubin: 0.7 mg/dL (ref 0.3–1.2)
Total Protein: 5.3 g/dL — ABNORMAL LOW (ref 6.5–8.1)

## 2015-03-02 LAB — CREATININE, SERUM
Creatinine, Ser: 1.31 mg/dL — ABNORMAL HIGH (ref 0.61–1.24)
GFR calc Af Amer: >60 mL/min (ref >60)
GFR calc non Af Amer: 53 mL/min — ABNORMAL LOW (ref >60)

## 2015-03-02 LAB — LIPASE, BLOOD: Lipase: 28 U/L (ref 22–51)

## 2015-03-02 SURGERY — LAPAROSCOPIC CHOLECYSTECTOMY WITH INTRAOPERATIVE CHOLANGIOGRAM
Anesthesia: General | Site: Abdomen

## 2015-03-02 MED ORDER — BUPIVACAINE-EPINEPHRINE 0.5% -1:200000 IJ SOLN
INTRAMUSCULAR | Status: DC | PRN
  Administered 2015-03-02: 19 mL

## 2015-03-02 MED ORDER — 0.9 % SODIUM CHLORIDE (POUR BTL) OPTIME
TOPICAL | Status: DC | PRN
  Administered 2015-03-02: 1000 mL

## 2015-03-02 MED ORDER — CEFAZOLIN SODIUM-DEXTROSE 2-3 GM-% IV SOLR
2.0000 g | INTRAVENOUS | Status: AC
  Administered 2015-03-02: 2 g via INTRAVENOUS

## 2015-03-02 MED ORDER — PROPOFOL 10 MG/ML IV BOLUS
INTRAVENOUS | Status: DC | PRN
  Administered 2015-03-02: 100 mg via INTRAVENOUS

## 2015-03-02 MED ORDER — METOCLOPRAMIDE HCL 5 MG/ML IJ SOLN
INTRAMUSCULAR | Status: DC | PRN
  Administered 2015-03-02: 10 mg via INTRAVENOUS

## 2015-03-02 MED ORDER — CEFAZOLIN SODIUM-DEXTROSE 2-3 GM-% IV SOLR
INTRAVENOUS | Status: AC
  Filled 2015-03-02: qty 50

## 2015-03-02 MED ORDER — HYDROMORPHONE HCL 1 MG/ML IJ SOLN
1.0000 mg | INTRAMUSCULAR | Status: DC | PRN
  Administered 2015-03-02: 1 mg via INTRAVENOUS

## 2015-03-02 MED ORDER — FENTANYL CITRATE (PF) 100 MCG/2ML IJ SOLN
INTRAMUSCULAR | Status: AC
  Filled 2015-03-02: qty 2

## 2015-03-02 MED ORDER — FENTANYL CITRATE (PF) 100 MCG/2ML IJ SOLN
25.0000 ug | INTRAMUSCULAR | Status: DC | PRN
  Administered 2015-03-02 (×2): 50 ug via INTRAVENOUS

## 2015-03-02 MED ORDER — LACTATED RINGERS IR SOLN
Status: DC | PRN
  Administered 2015-03-02: 1

## 2015-03-02 MED ORDER — CISATRACURIUM BESYLATE (PF) 10 MG/5ML IV SOLN
INTRAVENOUS | Status: DC | PRN
  Administered 2015-03-02: 6 mg via INTRAVENOUS

## 2015-03-02 MED ORDER — NEOSTIGMINE METHYLSULFATE 10 MG/10ML IV SOLN
INTRAVENOUS | Status: DC | PRN
  Administered 2015-03-02: 5 mg via INTRAVENOUS

## 2015-03-02 MED ORDER — ONDANSETRON HCL 4 MG/2ML IJ SOLN
INTRAMUSCULAR | Status: AC
  Filled 2015-03-02: qty 2

## 2015-03-02 MED ORDER — LACTATED RINGERS IV SOLN
INTRAVENOUS | Status: DC
Start: 2015-03-02 — End: 2015-03-02
  Administered 2015-03-02 (×3): via INTRAVENOUS

## 2015-03-02 MED ORDER — GLYCOPYRROLATE 0.2 MG/ML IJ SOLN
INTRAMUSCULAR | Status: DC | PRN
  Administered 2015-03-02: .8 mg via INTRAVENOUS

## 2015-03-02 MED ORDER — FENTANYL CITRATE (PF) 100 MCG/2ML IJ SOLN
INTRAMUSCULAR | Status: DC | PRN
  Administered 2015-03-02: 50 ug via INTRAVENOUS
  Administered 2015-03-02: 100 ug via INTRAVENOUS
  Administered 2015-03-02 (×2): 50 ug via INTRAVENOUS

## 2015-03-02 MED ORDER — SUCCINYLCHOLINE CHLORIDE 20 MG/ML IJ SOLN
INTRAMUSCULAR | Status: DC | PRN
  Administered 2015-03-02: 100 mg via INTRAVENOUS

## 2015-03-02 MED ORDER — GLYCOPYRROLATE 0.2 MG/ML IJ SOLN
INTRAMUSCULAR | Status: AC
Start: 2015-03-02 — End: 2015-03-02
  Filled 2015-03-02: qty 3

## 2015-03-02 MED ORDER — DEXAMETHASONE SODIUM PHOSPHATE 10 MG/ML IJ SOLN
INTRAMUSCULAR | Status: AC
  Filled 2015-03-02: qty 1

## 2015-03-02 MED ORDER — DEXAMETHASONE SODIUM PHOSPHATE 10 MG/ML IJ SOLN
INTRAMUSCULAR | Status: DC | PRN
  Administered 2015-03-02: 10 mg via INTRAVENOUS

## 2015-03-02 MED ORDER — ONDANSETRON HCL 4 MG/2ML IJ SOLN
INTRAMUSCULAR | Status: DC | PRN
  Administered 2015-03-02: 4 mg via INTRAVENOUS

## 2015-03-02 MED ORDER — PROPOFOL 10 MG/ML IV BOLUS
INTRAVENOUS | Status: AC
  Filled 2015-03-02: qty 20

## 2015-03-02 MED ORDER — CHLORHEXIDINE GLUCONATE 4 % EX LIQD: 1.0000 "application " | Freq: Once | CUTANEOUS | Status: DC

## 2015-03-02 MED ORDER — ENOXAPARIN SODIUM 40 MG/0.4ML ~~LOC~~ SOLN
40.0000 mg | SUBCUTANEOUS | Status: DC
  Filled 2015-03-02: qty 0.4

## 2015-03-02 MED ORDER — ONDANSETRON HCL 4 MG/2ML IJ SOLN: 4.0000 mg | Freq: Four times a day (QID) | INTRAMUSCULAR | Status: DC | PRN

## 2015-03-02 MED ORDER — POTASSIUM CHLORIDE IN NACL 20-0.9 MEQ/L-% IV SOLN
INTRAVENOUS | Status: DC
Start: 2015-03-02 — End: 2015-03-03
  Administered 2015-03-02 – 2015-03-03 (×2): via INTRAVENOUS
  Filled 2015-03-02 (×2): qty 1000

## 2015-03-02 MED ORDER — MEPERIDINE HCL 50 MG/ML IJ SOLN: 6.2500 mg | INTRAMUSCULAR | Status: DC | PRN

## 2015-03-02 MED ORDER — METOCLOPRAMIDE HCL 5 MG/ML IJ SOLN
INTRAMUSCULAR | Status: AC
  Filled 2015-03-02: qty 2

## 2015-03-02 MED ORDER — ETOMIDATE 2 MG/ML IV SOLN
INTRAVENOUS | Status: DC | PRN
  Administered 2015-03-02: 10 mg via INTRAVENOUS

## 2015-03-02 MED ORDER — CISATRACURIUM BESYLATE 20 MG/10ML IV SOLN
INTRAVENOUS | Status: AC
  Filled 2015-03-02: qty 10

## 2015-03-02 MED ORDER — OXYCODONE-ACETAMINOPHEN 5-325 MG PO TABS
1.0000 | ORAL_TABLET | ORAL | Status: DC | PRN
  Administered 2015-03-02: 2 via ORAL
  Filled 2015-03-02: qty 2

## 2015-03-02 MED ORDER — MENTHOL 3 MG MT LOZG
1.0000 | LOZENGE | OROMUCOSAL | Status: DC | PRN
  Administered 2015-03-02: 3 mg via ORAL
  Filled 2015-03-02: qty 9

## 2015-03-02 MED ORDER — FENTANYL CITRATE (PF) 250 MCG/5ML IJ SOLN
INTRAMUSCULAR | Status: AC
  Filled 2015-03-02: qty 5

## 2015-03-02 MED ORDER — ONDANSETRON HCL 4 MG PO TABS: 4.0000 mg | ORAL_TABLET | Freq: Four times a day (QID) | ORAL | Status: DC | PRN

## 2015-03-02 MED ORDER — PROMETHAZINE HCL 25 MG/ML IJ SOLN: 6.2500 mg | INTRAMUSCULAR | Status: DC | PRN

## 2015-03-02 MED ORDER — BUPIVACAINE-EPINEPHRINE 0.5% -1:200000 IJ SOLN
INTRAMUSCULAR | Status: AC
  Filled 2015-03-02: qty 1

## 2015-03-02 MED ORDER — ETOMIDATE 2 MG/ML IV SOLN
INTRAVENOUS | Status: AC
  Filled 2015-03-02: qty 10

## 2015-03-02 SURGICAL SUPPLY — 29 items
APPLIER CLIP ROT 10 11.4 M/L (STAPLE) ×2
BENZOIN TINCTURE PRP APPL 2/3 (GAUZE/BANDAGES/DRESSINGS) IMPLANT
CLIP APPLIE ROT 10 11.4 M/L (STAPLE) ×1 IMPLANT
COVER MAYO STAND STRL (DRAPES) ×2 IMPLANT
DECANTER SPIKE VIAL GLASS SM (MISCELLANEOUS) ×2 IMPLANT
DRAPE C-ARM 42X120 X-RAY (DRAPES) ×2 IMPLANT
DRAPE LAPAROSCOPIC ABDOMINAL (DRAPES) ×2 IMPLANT
ELECT REM PT RETURN 9FT ADLT (ELECTROSURGICAL) ×2
ELECTRODE REM PT RTRN 9FT ADLT (ELECTROSURGICAL) ×1 IMPLANT
GLOVE EUDERMIC 7 POWDERFREE (GLOVE) ×2 IMPLANT
GOWN STRL REUS W/TWL XL LVL3 (GOWN DISPOSABLE) ×8 IMPLANT
HEMOSTAT SNOW SURGICEL 2X4 (HEMOSTASIS) ×2 IMPLANT
KIT BASIN OR (CUSTOM PROCEDURE TRAY) ×2 IMPLANT
LIQUID BAND (GAUZE/BANDAGES/DRESSINGS) ×2 IMPLANT
PEN SKIN MARKING BROAD (MISCELLANEOUS) ×2 IMPLANT
POUCH SPECIMEN RETRIEVAL 10MM (ENDOMECHANICALS) ×2 IMPLANT
SCISSORS LAP 5X35 DISP (ENDOMECHANICALS) ×2 IMPLANT
SET CHOLANGIOGRAPH MIX (MISCELLANEOUS) ×2 IMPLANT
SET IRRIG TUBING LAPAROSCOPIC (IRRIGATION / IRRIGATOR) ×2 IMPLANT
SLEEVE XCEL OPT CAN 5 100 (ENDOMECHANICALS) ×2 IMPLANT
STRIP CLOSURE SKIN 1/2X4 (GAUZE/BANDAGES/DRESSINGS) IMPLANT
SUT MNCRL AB 4-0 PS2 18 (SUTURE) ×2 IMPLANT
SUT VICRYL 0 UR6 27IN ABS (SUTURE) ×2 IMPLANT
TOWEL OR 17X26 10 PK STRL BLUE (TOWEL DISPOSABLE) ×2 IMPLANT
TOWEL OR NON WOVEN STRL DISP B (DISPOSABLE) ×2 IMPLANT
TRAY LAPAROSCOPIC (CUSTOM PROCEDURE TRAY) ×2 IMPLANT
TROCAR BLADELESS OPT 5 100 (ENDOMECHANICALS) ×2 IMPLANT
TROCAR XCEL BLUNT TIP 100MML (ENDOMECHANICALS) ×2 IMPLANT
TROCAR XCEL NON-BLD 11X100MML (ENDOMECHANICALS) ×2 IMPLANT

## 2015-03-02 NOTE — Interval H&P Note (Signed)
History and Physical Interval Note:  03/02/2015 3:14 PM  Neil [name]  has presented today for surgery, with the diagnosis of gallstones  The various methods of treatment have been discussed with the patient and family. After consideration of risks, benefits and other options for treatment, the patient has consented to  Procedure(s): LAPAROSCOPIC CHOLECYSTECTOMY WITH INTRAOPERATIVE CHOLANGIOGRAM POSSIBLE OPEN (N/A) as a surgical intervention .  The patient's history has been reviewed, patient examined, no change in status, stable for surgery.  I have reviewed the patient's chart and labs.  Questions were answered to the patient's satisfaction.     [name]

## 2015-03-02 NOTE — Op Note (Signed)
Patient Name:           Neil [name]   Date of Surgery:        03/02/2015  Pre op Diagnosis:      Chronic cholecystitis with cholelithiasis                                       Choledocholithiasis, status post ERCP with stone removal and stent placement  Post op Diagnosis:    Same  Procedure:                 Laparoscopic cholecystectomy  Surgeon:                     Edsel Petrin. Dalbert Batman, M.D., FACS  Assistant:                      OR staff  Operative Indications:    This is a very pleasant 74 year old man, referred to me by Dr. [name] for gallstones and common bile duct stones. I have previously repaired his right inguinal hernia in the past. Dr. [name] is his PCP. Over the past few weeks he has had some intermittent problems with chills, nausea, vomiting, and transient darkening of his urine. Denies back pain. Minimal abdominal pain. These are self-limited episodes. He's had 2 or 3 episodes. Evaluation for UTI was negative. CT scan of the abdomen and pelvis showed gallstones and numerous common bile duct stones with intrahepatic and extrahepatic biliary dilatation. No mass or malignancy seen. Gallstones noted. Coronary atherosclerosis. BPH. Small stable abdominal aortic aneurysm.  [name] He was evaluated by Dr. Leslee Home as an outpatient and by me as an outpatient. He was admitted to the hospital yesterday and underwent ERCP, sphincterotomy and removal of numerous common bile duct stones with stent placement. He did well overnight with no complications and normal lab work this morning. He is brought to the operating room for cholecystectomy  Operative Findings:       The gallbladder was thick-walled, discolored, and chronically inflamed. There are moderate chronic adhesions to the gallbladder. Dissection of the gallbladder out of the gallbladder bed revealed some chronic fibrosis and the gallbladder bed was somewhat raw requiring and some cautery and placement of snow hemostatic sponge.  At the completion of the case there was essentially no bleeding and no bile leak. The liver, stomach, duodenum, omentum, colon, and small bowel were grossly normal.  Procedure in Detail:          Following the induction of general endotracheal anesthesia the patient's abdomen was prepped and draped in a sterile fashion. Intravenous antibiotics were given. Surgical timeout was performed. 0.5% Marcaine with epinephrine was used as a local infiltration anesthetic.     An 11 mm Hassan trocar was placed in the midline just above the umbilicus with open technique. This was uneventful. The trocar was secured with 0 Vicryl sutures and pneumoperitoneum was created. Video camera was inserted. Trochars placed in subxiphoid region and two 5 mm trochars placed in the right upper quadrant. I again identified the fundus of the gallbladder and elevated that. We took down all the adhesions until we had completely identified the infundibulum of the gallbladder. We could see the common duct. We incised the peritoneum over the neck of the gallbladder. We isolated the cystic duct and cystic artery completely and we had an excellent critical view.  the cystic artery and the cystic duct was secured with multiple medical clips and divided. Gallbladder was dissected from its bed with electrocautery, placed in a specimen bag and removed. The bed of the gallbladder required some cautery but ultimately there was no bleeding. Irrigation fluid became completely clear. Snow hemostatic sponges placed in the bed of the gallbladder. This was observed for 5 minutes. There was no further bleeding.,      The trochars were removed under direct vision. There was no bleeding from the trocar sites. The pneumoperitoneum was released. The fascia at the umbilicus was closed with interrupted 0 Vicryl sutures and the Purstring suture of 0 Vicryl. The skin incisions were closed with subcuticular sutures of 4-0 Monocryl and Dermabond. The patient tolerated the  procedure well was taken to PACU in stable condition. EBL 25 mL. Counts correct. Complications none.     Edsel Petrin. Dalbert Batman, M.D., FACS General and Minimally Invasive Surgery Breast and Colorectal Surgery  03/02/2015 4:46 PM

## 2015-03-02 NOTE — Progress Notes (Signed)
1 Day Post-Op  Subjective: Stable and alert. Denies pain or nausea or cramps. Passing flatus. Hungry. Only complaint is polyuria.  Getting up to void every 2 hours. IVs have been running at 150 mL/h and he appears volume expanded. Labs look good. Creatinine 1.48. Glucose 133. LFTs normal. Lipase normal. WBC 8200. Hemoglobin 10.8 compared to 13.0 preop. Suspect this is dilutional.   Objective: Vital signs in last 24 hours: Temp:  [97.5 F (36.4 C)-98.9 F (37.2 C)] 98.9 F (37.2 C) (05/18 0700) Pulse Rate:  [58-80] 58 (05/18 0700) Resp:  [16-20] 20 (05/18 0700) BP: (122-177)/(49-77) 122/54 mmHg (05/18 0700) SpO2:  [97 %-100 %] 97 % (05/18 0700) Weight:  [72.802 kg (160 lb 8 oz)] 72.802 kg (160 lb 8 oz) (05/17 1438)    Intake/Output from previous day: 05/17 0701 - 05/18 0700 In: 2030 [P.O.:360; I.V.:1550] Out: -  Intake/Output this shift:     EXAM: General appearance: Alert. No distress. Mental status normal. Friendly and cooperative. GI: soft, non-tender; bowel sounds normal; no masses,  no organomegaly  Lab Results:   Recent Labs  03/02/15 0528  WBC 8.2  HGB 10.8*  HCT 33.9*  PLT 157   BMET  Recent Labs  03/02/15 0528  NA 140  K 4.3  CL 108  CO2 25  GLUCOSE 133*  BUN 16  CREATININE 1.48*  CALCIUM 8.4*   PT/INR No results for input(s): LABPROT, INR in the last 72 hours. ABG No results for input(s): PHART, HCO3 in the last 72 hours.  Invalid input(s): PCO2, PO2  Studies/Results: Dg Ercp Biliary & Pancreatic Ducts  03/01/2015   CLINICAL DATA:  Cholelithiasis and choledocholithiasis.  EXAM: ERCP  TECHNIQUE: Multiple spot images obtained with the fluoroscopic device and submitted for interpretation post-procedure.  FLUOROSCOPY TIME:  Radiation Exposure Index (as provided by the fluoroscopic device):  If the device does not provide the exposure index:  Fluoroscopy Time: Dr. Blanch Media fluorscopy time was 15 minutes 24 seconds.  Number of Acquired Images:  0   COMPARISON:  02/11/2015  FINDINGS: Initial images demonstrate placement of scope and cannulation of the common bile duct with injection. Initial pancreatic duct injection unremarkable.  Multiple large filling defects are present in the dilated common bile duct, which is just above the same 13 mm diameter of the scope itself.  [name] performed sphincterotomy and extracted numerous stones. There still stone debris in the CBD following multiple extractions. [name] placed a biliary stent.  The gallbladder fills with contrast, outlining multiple gallstones.  IMPRESSION: 1. Sphincterotomy with numerous stones extracted from the gallbladder. There was residual stone debris residual, and [name] placed a biliary stent. Cholelithiasis is also noted. These images were submitted for radiologic interpretation only. Please see the procedural report for the amount of contrast and the fluoroscopy time utilized.   Electronically Signed   By: Van Clines M.D.   On: 03/01/2015 14:05    Anti-infectives: Anti-infectives    Start     Dose/Rate Route Frequency Ordered Stop   03/01/15 1600  ciprofloxacin (CIPRO) IVPB 400 mg     400 mg 200 mL/hr over 60 Minutes Intravenous Every 12 hours 03/01/15 1345     03/01/15 1015  ciprofloxacin (CIPRO) IVPB 400 mg  Status:  Discontinued     400 mg 200 mL/hr over 60 Minutes Intravenous  Once 03/01/15 1008 03/01/15 1416      Assessment/Plan: s/p Procedure(s): ENDOSCOPIC RETROGRADE CHOLANGIOPANCREATOGRAPHY (ERCP)  POD #1. ERCP, sphincterotomy, partial removal of CBD stones,  placement of stent. No evidence of any complication or biliary obstruction  Chronic cholecystitis with cholelithiasis. Cholecystectomy is indicated but elective. He would like to proceed with cholecystectomy today since he is doing well and has already arranged for time off from work. We will therefore proceed with laparoscopic cholecystectomy this afternoon. This will probably be late in the  day so probably will I will observe him overnight. We have previously discussed the techniques of surgery and the risks of surgery.[name] He understands and agrees with this plan.  Asymptomatic anemia. Suspect this is primarily dilutional due to volume expansion. IV fluid rate is decreased  Hypertension, benign  History of BPH  History of RBBB      Neil [name] M 03/02/2015

## 2015-03-02 NOTE — Anesthesia Postprocedure Evaluation (Signed)
  Anesthesia Post-op Note  Patient: Neil [name]  Procedure(s) Performed: Procedure(s) (LRB): LAPAROSCOPIC CHOLECYSTECTOMY (N/A)  Patient Location: PACU  Anesthesia Type: General  Level of Consciousness: awake and alert   Airway and Oxygen Therapy: Patient Spontanous Breathing  Post-op Pain: mild  Post-op Assessment: Post-op Vital signs reviewed, Patient's Cardiovascular Status Stable, Respiratory Function Stable, Patent Airway and No signs of Nausea or vomiting  Last Vitals:  Filed Vitals:   03/02/15 1730  BP: 151/54  Pulse: 73  Temp:   Resp: 14    Post-op Vital Signs: stable   Complications: No apparent anesthesia complications

## 2015-03-02 NOTE — Anesthesia Procedure Notes (Signed)
Procedure Name: Intubation Date/Time: 03/02/2015 3:39 PM Performed by: Johnathan Hausen A Pre-anesthesia Checklist: Patient identified, Timeout performed, Emergency Drugs available, Suction available and Patient being monitored Patient Re-evaluated:Patient Re-evaluated prior to inductionOxygen Delivery Method: Circle system utilized Preoxygenation: Pre-oxygenation with 100% oxygen Intubation Type: Combination inhalational/ intravenous induction and Cricoid Pressure applied Ventilation: Mask ventilation without difficulty Laryngoscope Size: 5 and Mac Grade View: Grade II Tube type: Oral Number of attempts: 1 Airway Equipment and Method: Stylet Placement Confirmation: ETT inserted through vocal cords under direct vision,  positive ETCO2 and breath sounds checked- equal and bilateral Secured at: 22 cm Tube secured with: Tape Dental Injury: Teeth and Oropharynx as per pre-operative assessment

## 2015-03-02 NOTE — Anesthesia Preprocedure Evaluation (Signed)
Anesthesia Evaluation  Patient identified by MRN, date of birth, ID band Patient awake    Reviewed: Allergy & Precautions, NPO status , Patient's Chart, lab work & pertinent test results  History of Anesthesia Complications Negative for: history of anesthetic complications  Airway Mallampati: II  TM Distance: >3 FB Neck ROM: Full    Dental no notable dental hx. (+) Dental Advisory Given, Edentulous Upper, Edentulous Lower, Upper Dentures, Lower Dentures   Pulmonary former smoker,  breath sounds clear to auscultation  Pulmonary exam normal       Cardiovascular hypertension, Pt. on medications Normal cardiovascular exam+ dysrhythmias (RBBB) Rhythm:Regular Rate:Normal     Neuro/Psych negative neurological ROS  negative psych ROS   GI/Hepatic negative GI ROS, Neg liver ROS,   Endo/Other  diabetes  Renal/GU negative Renal ROS  negative genitourinary   Musculoskeletal negative musculoskeletal ROS (+)   Abdominal   Peds negative pediatric ROS (+)  Hematology negative hematology ROS (+)   Anesthesia Other Findings   Reproductive/Obstetrics negative OB ROS                             Anesthesia Physical  Anesthesia Plan  ASA: II  Anesthesia Plan: General   Post-op Pain Management:    Induction: Intravenous  Airway Management Planned: Oral ETT  Additional Equipment:   Intra-op Plan:   Post-operative Plan: Extubation in OR  Informed Consent: I have reviewed the patients History and Physical, chart, labs and discussed the procedure including the risks, benefits and alternatives for the proposed anesthesia with the patient or authorized representative who has indicated his/her understanding and acceptance.   Dental advisory given  Plan Discussed with: CRNA  Anesthesia Plan Comments:         Anesthesia Quick Evaluation

## 2015-03-02 NOTE — Progress Notes (Signed)
     Neil [name] Progress Note  Subjective:   No nausea, vomiting or cramps. For lap chole later today.    Objective:  Vital signs in last 24 hours: Temp:  [97.5 F (36.4 C)-98.9 F (37.2 C)] 98.9 F (37.2 C) (05/18 0700) Pulse Rate:  [58-80] 58 (05/18 0700) Resp:  [16-20] 20 (05/18 0700) BP: (122-177)/(49-77) 122/54 mmHg (05/18 0700) SpO2:  [97 %-100 %] 97 % (05/18 0700) Weight:  [160 lb 8 oz (72.802 kg)] 160 lb 8 oz (72.802 kg) (05/17 1438)   General:   Alert,  Well-developed,    in NAD Heart:  Regular rate and rhythm; no murmurs Pulm;lungs clear Abdomen:  Soft, nontender and nondistended. Normal bowel sounds, without guarding, and without rebound.   NeurologicAlert and  oriented x4;  grossly normal neurologically. Psych:  Alert and cooperative. Normal mood and affect.  Intake/Output from previous day: 05/17 0701 - 05/18 0700 In: 2030 [P.O.:360; I.V.:1550] Out: -  Intake/Output this shift:    Lab Results:  Recent Labs  03/02/15 0528  WBC 8.2  HGB 10.8*  HCT 33.9*  PLT 157   BMET  Recent Labs  03/02/15 0528  NA 140  K 4.3  CL 108  CO2 25  GLUCOSE 133*  BUN 16  CREATININE 1.48*  CALCIUM 8.4*   LFT  Recent Labs  03/02/15 0528  PROT 5.3*  ALBUMIN 3.0*  AST 28  ALT 23  ALKPHOS 36*  BILITOT 0.7    Dg Ercp Biliary & Pancreatic Ducts  03/01/2015   CLINICAL DATA:  Cholelithiasis and choledocholithiasis.  EXAM: ERCP  TECHNIQUE: Multiple spot images obtained with the fluoroscopic device and submitted for interpretation post-procedure.  FLUOROSCOPY TIME:  Radiation Exposure Index (as provided by the fluoroscopic device):  If the device does not provide the exposure index:  Fluoroscopy Time: Dr. Blanch Media fluorscopy time was 15 minutes 24 seconds.  Number of Acquired Images:  0  COMPARISON:  02/11/2015  FINDINGS: Initial images demonstrate placement of scope and cannulation of the common bile duct with injection. Initial pancreatic duct  injection unremarkable.  Multiple large filling defects are present in the dilated common bile duct, which is just above the same 13 mm diameter of the scope itself.  [name] performed sphincterotomy and extracted numerous stones. There still stone debris in the CBD following multiple extractions. [name] placed a biliary stent.  The gallbladder fills with contrast, outlining multiple gallstones.  IMPRESSION: 1. Sphincterotomy with numerous stones extracted from the gallbladder. There was residual stone debris residual, and [name] placed a biliary stent. Cholelithiasis is also noted. These images were submitted for radiologic interpretation only. Please see the procedural report for the amount of contrast and the fluoroscopy time utilized.   Electronically Signed   By: Van Clines M.D.   On: 03/01/2015 14:05    ASSESSMENT/PLAN:   S/P ERCP with sphincterotomy with extraction numerous stone yesterday with biliary stent placement. For surgery today. GI office will call pt next week to schedule f/u appt.    [name], Neil Ping 03/02/2015, Pager [phone]  GI ATTENDING  Interval history data reviewed. Patient doing well post-ERCP with multiple large stone extraction and stent placement for residual calculi. For laparoscopic cholecystectomy later today with Dr. Dalbert Batman. Transfer surgical service postop. I will see him back in the office after his routine outpatient postop follow-up. We'll arrange follow-up ERCP thereafter Thank you  Docia Chuck. Geri Seminole., M.D. [name]

## 2015-03-02 NOTE — Transfer of Care (Signed)
Immediate Anesthesia Transfer of Care Note  Patient: Neil [name]  Procedure(s) Performed: Procedure(s): LAPAROSCOPIC CHOLECYSTECTOMY (N/A)  Patient Location: PACU  Anesthesia Type:General  Level of Consciousness:  sedated, patient cooperative and responds to stimulation  Airway & Oxygen Therapy:Patient Spontanous Breathing and Patient connected to face mask oxgen  Post-op Assessment:  Report given to PACU RN and Post -op Vital signs reviewed and stable  Post vital signs:  Reviewed and stable  Last Vitals:  Filed Vitals:   03/02/15 1658  BP: 175/67  Pulse: 82  Temp: 37.2 C  Resp:     Complications: No apparent anesthesia complications

## 2015-03-03 ENCOUNTER — Telehealth: Payer: Self-pay | Admitting: *Deleted

## 2015-03-03 ENCOUNTER — Encounter (HOSPITAL_COMMUNITY): Payer: Self-pay | Admitting: Internal Medicine

## 2015-03-03 DIAGNOSIS — K8064 Calculus of gallbladder and bile duct with chronic cholecystitis without obstruction: Secondary | ICD-10-CM | POA: Diagnosis not present

## 2015-03-03 DIAGNOSIS — Z87891 Personal history of nicotine dependence: Secondary | ICD-10-CM | POA: Diagnosis not present

## 2015-03-03 DIAGNOSIS — Z86010 Personal history of colonic polyps: Secondary | ICD-10-CM | POA: Diagnosis not present

## 2015-03-03 DIAGNOSIS — R338 Other retention of urine: Secondary | ICD-10-CM | POA: Diagnosis not present

## 2015-03-03 DIAGNOSIS — I714 Abdominal aortic aneurysm, without rupture: Secondary | ICD-10-CM | POA: Diagnosis not present

## 2015-03-03 DIAGNOSIS — N400 Enlarged prostate without lower urinary tract symptoms: Secondary | ICD-10-CM | POA: Diagnosis not present

## 2015-03-03 DIAGNOSIS — E785 Hyperlipidemia, unspecified: Secondary | ICD-10-CM | POA: Diagnosis not present

## 2015-03-03 DIAGNOSIS — I4510 Unspecified right bundle-branch block: Secondary | ICD-10-CM | POA: Diagnosis not present

## 2015-03-03 DIAGNOSIS — I10 Essential (primary) hypertension: Secondary | ICD-10-CM | POA: Diagnosis not present

## 2015-03-03 DIAGNOSIS — I2510 Atherosclerotic heart disease of native coronary artery without angina pectoris: Secondary | ICD-10-CM | POA: Diagnosis not present

## 2015-03-03 MED ORDER — HYDROCODONE-ACETAMINOPHEN 5-325 MG PO TABS: 1.0000 | ORAL_TABLET | ORAL | Status: DC | PRN

## 2015-03-03 MED ORDER — HYDROCODONE-ACETAMINOPHEN 5-325 MG PO TABS: 1.0000 | ORAL_TABLET | Freq: Four times a day (QID) | ORAL | Status: DC | PRN

## 2015-03-03 NOTE — Progress Notes (Signed)
Neil [name] to be D/C'd Home per MD order.  Discussed prescriptions and follow up appointments with the patient. Prescriptions given to patient, medication list explained in detail. Pt verbalized understanding.    Medication List    STOP taking these medications        ciprofloxacin 250 MG tablet  Commonly known as:  CIPRO     ciprofloxacin 500 MG tablet  Commonly known as:  CIPRO      TAKE these medications        acetaminophen 500 MG tablet  Commonly known as:  TYLENOL  Take 1,500 mg by mouth 2 (two) times daily.     benazepril 40 MG tablet  Commonly known as:  LOTENSIN  TAKE ONE TABLET BY MOUTH ONCE DAILY     fenofibrate 160 MG tablet  TAKE ONE TABLET BY MOUTH ONCE DAILY     HYDROcodone-acetaminophen 5-325 MG per tablet  Commonly known as:  NORCO  Take 1-2 tablets by mouth every 6 (six) hours as needed.     sodium chloride 0.65 % nasal spray  Commonly known as:  OCEAN  Place 1 spray into the nose daily as needed for congestion.     terazosin 2 MG capsule  Commonly known as:  HYTRIN  TAKE ONE CAPSULE BY MOUTH ONCE DAILY AT BEDTIME        Filed Vitals:   03/03/15 0450  BP: 133/60  Pulse: 63  Temp: 98.6 F (37 C)  Resp: 18    Skin clean, dry and intact without evidence of skin break down, no evidence of skin tears noted. IV catheter discontinued intact. Site without signs and symptoms of complications. Dressing and pressure applied. Pt denies pain at this time. No complaints noted.  An After Visit Summary was printed and given to the patient. Patient escorted via [place], and D/C home via private auto.  Lolita Rieger 03/03/2015 9:39 AM

## 2015-03-03 NOTE — Discharge Summary (Signed)
Patient ID: Neil [name] [id number] 74 y.o. 05-09-1941  Admit date: 03/01/2015  Discharge date and time: 03/03/2015  Admitting Physician: Perry,John,MD  Discharge Physician: [name]  Admission Diagnoses: symptomatic gallstones gallstones  Discharge Diagnoses: Choledocholithiasis                                         Chronic cholecystitis with cholelithiasis                                          Urinary retention, postop                                           Hypertension, benign                                           History right bundle branch block                                           History BPH  Operations: Procedure(s): LAPAROSCOPIC CHOLECYSTECTOMY  Admission Condition: good  Discharged Condition: good  Indication for Admission: This is a very pleasant 74 year old man, referred to me by Dr. [name] for gallstones and common bile duct stones. I have previously repaired his right inguinal hernia in the past. Dr. [name] is his PCP. Over the past few weeks he has had some intermittent problems with chills, nausea, vomiting, and transient darkening of his urine. Denies back pain. Minimal abdominal pain. These are self-limited episodes. He's had 2 or 3 episodes. Evaluation for UTI was negative. CT scan of the abdomen and pelvis showed gallstones and numerous common bile duct stones with intrahepatic and extrahepatic biliary dilatation. No mass or malignancy seen. Gallstones noted. Coronary atherosclerosis. BPH. Small stable abdominal aortic aneurysm. [name] He was evaluated by [name] as an outpatient and by me as an outpatient.     [name] arranged for elective ERCP with sphincterotomy and stone removal. I coordinated laparoscopic cholecystectomy, and scheduled that for the following day.   Hospital Course: On the day of admission the patient underwent ERCP, sphincterotomy, and stent placement. [name] found numerous common bile duct  stones, and he remove numerous stones. and there was some residual stone debris that could not be removed at this time and so a stent was placed. The patient was placed on antibiotics and observed overnight and he did very well without any sign of complication. The following morning his liver function tests were normal and his lab work was normal and he was doing well. He wanted to go ahead with the elective cholecystectomy.       On 03/02/2015 he underwent laparoscopic cholecystectomy with cholangiogram.The gallbladder was thick-walled, discolored, and chronically inflamed. There are moderate chronic adhesions to the gallbladder. Dissection of the gallbladder out of the gallbladder bed revealed some chronic fibrosis and the gallbladder bed was somewhat raw requiring and some cautery and placement of snow hemostatic sponge. At the completion of the  case there was essentially no bleeding and no bile leak. The liver, stomach, duodenum, omentum, colon, and small bowel were grossly normal.     He was observed overnight. He had problems with urinary retention and a Foley catheter was placed. Otherwise he did very well. The following morning he was up and ambulating in the halls. Was hungry and wanted TEE. Was having minimal pain. On exam his lungs are clear but his abdomen is soft with good bowel sounds and the wounds look good.          The plan is to allow a regular diet and to discharge home today. We're going to remove the Foley catheter and be sure that he can void on his own before going home. We have discontinued his IV fluids and antibiotics. He was given a prescription for Norco for pain and told to continue his usual medications. He was given instructions in diet and activities.      He will follow-up with Dr. Dalbert Batman in 2-3 weeks. He was also instructed to make an appointment see [name] in 6-8 weeks for follow-up ERCP and possible stent removal assuming that the rest of his common duct stones have  passed.   Consults: None  Significant Diagnostic Studies: Lab work. ERCP cholangiogram. Surgical pathology.  Treatments: surgery: ERCP with sphincterotomy and stent placement, laparoscopic cholecystectomy. IV antibiotics. Foley catheter.  Disposition: Home  Patient Instructions:    Medication List    STOP taking these medications        ciprofloxacin 250 MG tablet  Commonly known as:  CIPRO     ciprofloxacin 500 MG tablet  Commonly known as:  CIPRO      TAKE these medications        acetaminophen 500 MG tablet  Commonly known as:  TYLENOL  Take 1,500 mg by mouth 2 (two) times daily.     benazepril 40 MG tablet  Commonly known as:  LOTENSIN  TAKE ONE TABLET BY MOUTH ONCE DAILY     fenofibrate 160 MG tablet  TAKE ONE TABLET BY MOUTH ONCE DAILY     HYDROcodone-acetaminophen 5-325 MG per tablet  Commonly known as:  NORCO  Take 1-2 tablets by mouth every 6 (six) hours as needed.     sodium chloride 0.65 % nasal spray  Commonly known as:  OCEAN  Place 1 spray into the nose daily as needed for congestion.     terazosin 2 MG capsule  Commonly known as:  HYTRIN  TAKE ONE CAPSULE BY MOUTH ONCE DAILY AT BEDTIME        Activity: Ambulate as much as possible. May drive in a few days. No sports or heavy lifting for 2 weeks. Diet: low fat, low cholesterol diet Wound Care: none needed  Follow-up:  With Dr. Dalbert Batman  in 2 weeks.  Signed: Edsel Petrin. Dalbert Batman, M.D., FACS General and minimally invasive surgery Breast and Colorectal Surgery  03/03/2015, 7:02 AM

## 2015-03-03 NOTE — Progress Notes (Signed)
Patient c/o iv antibiotic (Cipro) and maintenance iv fluids burning in left hand. At this time iv switched to pts right hand. Leakage noted to right hand so tegaderm is changed and iv flushed with 3 ml of saline. Patient continues to c/o of iv fluids burning after being flushed and put on a slower rate. RN asked patient if it was ok to changed iv sites and patient refuses to have iv sites changed, flushed, or restarted at this time. Patient states "He has had enough" and "just wants to be left alone ".

## 2015-03-03 NOTE — Telephone Encounter (Signed)
Pt was tcm list d/c 03/02/15 had laparoscopic cholecystectomy pt will f/u with surgeon Dr. Fanny Skates in 2 weeks...Neil [name]

## 2015-03-07 ENCOUNTER — Telehealth: Payer: Self-pay | Admitting: Internal Medicine

## 2015-03-07 NOTE — Telephone Encounter (Signed)
Pt needs hospital follow-up appt with Cecille Rubin Hvozdovic, PA-C in 4 weeks and then have stent removal. Pt scheduled to see Lori Hvozdovic, PA-C 04/01/15@1 :15pm. Pt aware of appt. Pt will be scheduled for stent removal at [facility].

## 2015-03-10 ENCOUNTER — Other Ambulatory Visit: Payer: Self-pay

## 2015-03-10 MED ORDER — FENOFIBRATE 160 MG PO TABS: 160.0000 mg | ORAL_TABLET | Freq: Every day | ORAL | Status: DC

## 2015-03-10 MED ORDER — TERAZOSIN HCL 2 MG PO CAPS: 2.0000 mg | ORAL_CAPSULE | Freq: Once | ORAL | Status: DC

## 2015-03-10 MED ORDER — BENAZEPRIL HCL 40 MG PO TABS: 40.0000 mg | ORAL_TABLET | Freq: Every day | ORAL | Status: DC

## 2015-03-22 ENCOUNTER — Ambulatory Visit: Payer: Medicare Other | Admitting: Physician Assistant

## 2015-03-31 ENCOUNTER — Encounter: Payer: Self-pay | Admitting: *Deleted

## 2015-04-01 ENCOUNTER — Ambulatory Visit: Payer: Medicare Other | Admitting: Physician Assistant

## 2015-04-01 ENCOUNTER — Other Ambulatory Visit: Payer: Self-pay | Admitting: *Deleted

## 2015-04-01 ENCOUNTER — Other Ambulatory Visit (INDEPENDENT_AMBULATORY_CARE_PROVIDER_SITE_OTHER): Payer: Medicare Other

## 2015-04-01 ENCOUNTER — Encounter: Payer: Self-pay | Admitting: Physician Assistant

## 2015-04-01 ENCOUNTER — Ambulatory Visit (INDEPENDENT_AMBULATORY_CARE_PROVIDER_SITE_OTHER)
Admission: RE | Admit: 2015-04-01 | Discharge: 2015-04-01 | Disposition: A | Discharge status: Home / Self Care | Payer: Medicare Other | Source: Ambulatory Visit | Attending: Physician Assistant | Admitting: Physician Assistant

## 2015-04-01 ENCOUNTER — Ambulatory Visit (INDEPENDENT_AMBULATORY_CARE_PROVIDER_SITE_OTHER): Payer: Medicare Other | Admitting: Physician Assistant

## 2015-04-01 VITALS — BP 162/58 | Ht 67.25 in | Wt 159.5 lb

## 2015-04-01 DIAGNOSIS — Z960 Presence of urogenital implants: Secondary | ICD-10-CM | POA: Diagnosis not present

## 2015-04-01 DIAGNOSIS — K8050 Calculus of bile duct without cholangitis or cholecystitis without obstruction: Secondary | ICD-10-CM

## 2015-04-01 LAB — HEPATIC FUNCTION PANEL
ALT: 9 U/L (ref 0–53)
AST: 14 U/L (ref 0–37)
Albumin: 4.1 g/dL (ref 3.5–5.2)
Alkaline Phosphatase: 44 U/L (ref 39–117)
Bilirubin, Direct: 0.1 mg/dL (ref 0.0–0.3)
Total Bilirubin: 0.4 mg/dL (ref 0.2–1.2)
Total Protein: 7.5 g/dL (ref 6.0–8.3)

## 2015-04-01 LAB — CBC WITH DIFFERENTIAL/PLATELET
Basophils Absolute: 0 10*3/uL (ref 0.0–0.1)
Basophils Relative: 0.5 % (ref 0.0–3.0)
Eosinophils Absolute: 0.1 10*3/uL (ref 0.0–0.7)
Eosinophils Relative: 1.5 % (ref 0.0–5.0)
HCT: 37.8 % — ABNORMAL LOW (ref 39.0–52.0)
Hemoglobin: 12.3 g/dL — ABNORMAL LOW (ref 13.0–17.0)
Lymphocytes Relative: 22.3 % (ref 12.0–46.0)
Lymphs Abs: 2 10*3/uL (ref 0.7–4.0)
MCHC: 32.6 g/dL (ref 30.0–36.0)
MCV: 86.7 fl (ref 78.0–100.0)
Monocytes Absolute: 1 10*3/uL (ref 0.1–1.0)
Monocytes Relative: 10.6 % (ref 3.0–12.0)
Neutro Abs: 5.9 10*3/uL (ref 1.4–7.7)
Neutrophils Relative %: 65.1 % (ref 43.0–77.0)
Platelets: 361 10*3/uL (ref 150.0–400.0)
RBC: 4.36 Mil/uL (ref 4.22–5.81)
RDW: 14.4 % (ref 11.5–15.5)
WBC: 9 10*3/uL (ref 4.0–10.5)

## 2015-04-01 NOTE — Addendum Note (Signed)
Addended byMarisue Humble K on: 04/01/2015 03:04 PM   Modules accepted: Orders

## 2015-04-01 NOTE — Progress Notes (Signed)
Patient ID: Neil [name], male   DOB: 17-Dec-1940, 74 y.o.   MRN: [id number]   Subjective:    Patient ID: Neil [name], male    DOB: April 04, 1941, 74 y.o.   MRN: [id number]  HPI Neil [name] is a pleasant 74 year old white male who was seen recentlyby [name] for choledocholithiasis, and cholelithiasis.Neil [name] He underwent ERCP sphincterotomy and removal of numerous common bile duct stones, and placement of a biliary stent because of residual stone debris. This was done on 03/01/2015, and the following day he underwent laparoscopic cholecystectomy per Dr. Dalbert Batman. He did well and was discharged home the following day. He comes in today for follow-up and to arrange biliary stent removal. Patient says he felt fine after the ERCP and after surgery but about a week after returning home he developed right upper quadrant pain which she notices primarily in the evenings while sitting in his recliner. He says he can't lay on his right side at night sometimes because it bothers him. He is not having severe pain but more of a burning discomfort in the right upper quadrant. He says usually by the morning he feels okay and doesn't bother him during the day until later in the evening when it starts back up again. He says he also has developed early satiety. He was not having any similar symptoms prior to undergoing his procedures but says now he fills up very quickly and just feels full. He has not had any fever or chills, thank you no diarrhea, and no nausea or vomiting.  Review of Systems Pertinent positive and negative review of systems were noted in the above HPI section.  All other review of systems was otherwise negative.  Outpatient Encounter Prescriptions as of 04/01/2015  Medication Sig  . acetaminophen (TYLENOL) 500 MG tablet Take 1,500 mg by mouth 2 (two) times daily.   . benazepril (LOTENSIN) 40 MG tablet Take 1 tablet (40 mg total) by mouth daily.  . fenofibrate 160 MG tablet Take 1 tablet (160 mg total) by mouth  at bedtime.  . sodium chloride (OCEAN) 0.65 % nasal spray Place 1 spray into the nose daily as needed for congestion.   Neil [name] terazosin (HYTRIN) 2 MG capsule Take 1 capsule (2 mg total) by mouth once.  . [DISCONTINUED] HYDROcodone-acetaminophen (NORCO) 5-325 MG per tablet Take 1-2 tablets by mouth every 6 (six) hours as needed.   No facility-administered encounter medications on file as of 04/01/2015.   Allergies  Allergen Reactions  . Ezetimibe Other (See Comments)    Muscle and bone pain  . Statins     REACTION: muscle aches   Patient Active Problem List   Diagnosis Date Noted  . 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  . Choledocholithiasis 03/01/2015  . Calculus of bile duct with acute cholangitis with obstruction   . Gallstone   . Abnormal urine 02/08/2015  . Smoker 12/24/2014  . Skin lesion of back 12/12/2012  . Hypertriglyceridemia 11/14/2011  . Preventative health care 11/11/2011  . INGUINAL HERNIA, RIGHT, SMALL 11/06/2010  . BURSITIS, RIGHT SHOULDER 11/06/2010  . BUNDLE BRANCH BLOCK, RIGHT 07/21/2009  . DIVERTICULOSIS, COLON 07/21/2009  . Diabetes 12/04/2007  . ALLERGIC RHINITIS 12/04/2007  . Hyperlipidemia 06/05/2007  . ERECTILE DYSFUNCTION 06/05/2007  . Essential hypertension 06/05/2007  . BENIGN PROSTATIC HYPERTROPHY 06/05/2007  . COLONIC POLYPS, HX OF 06/05/2007   History   Social History  . Marital Status: Married    Spouse Name: N/A  . Number of Children: N/A  . Years  of Education: N/A   Occupational History  . retired    Social History Main Topics  . Smoking status: Former Smoker    Quit date: 10/15/2005  . Smokeless tobacco: Never Used  . Alcohol Use: No     Comment: rare  . Drug Use: No  . Sexual Activity: Not on file   Other Topics Concern  . Not on file   Social History Narrative    Mr. Iannello's family history includes Brain cancer (age of onset: 10) in his mother; Colon cancer (age of onset: 90) in his mother. There is no  history of Rectal cancer or Stomach cancer.      Objective:    Filed Vitals:   04/01/15 1323  BP: 162/58    Physical Exam  well-developed older white male in no acute distress, accompanied by his wife blood pressure 162/58 height 5 foot 7 weight 159. HEENT ;nontraumatic normocephalic EOMI PERRLA sclera anicteric, Supple ;no JVD, Cardiovascular; regular rate and rhythm with S1-S2 no murmur or gallop, Pulmonary ;clear bilaterally, Abdomen ;soft bowel sounds are present he is nontender there is no palpable mass or hepatosplenomegaly his incisional ports are healing, Extremities; no clubbing cyanosis or edema skin warm and dry, Neuropsych; mood and affect appropriate       Assessment & Plan:   #1 74 yo male with choledocholithiasis-s/p ERCP, sphincterotomy and stone extraction 03/01/2015 with bilary stent placement #2 s/p lap chole 03/02/2015  #3 RUQ burning pain and early satiety x 2 weeks-etiology not clear ?stent migration, RUQ inflammatory process post op  Plan; CBC, Hepatic panel  KUB today Upper abdominal US   Will schedule for ERCP and Stent removal with Dr Henrene Pastor   Week of July 8 th - may need to do sooner pending results of above. Procedure discussed in detail with pt and he is agreeable to proceed,  Alfredia Ferguson PA-C 04/01/2015   Cc: Biagio Borg, MD

## 2015-04-01 NOTE — Patient Instructions (Addendum)
Please go to the basement level to have your labs drawn and a radiology x-ray. You have been scheduled for an abdominal ultrasound at [facility] (1st floor of hospital) on  Friday 04-08-2015 at 8:30 am . Please arrive at 8:15  to your appointment for registration. Make certain not to have anything to eat after midnight. . Should you need to reschedule your appointment, please contact radiology at [phone]. This test typically takes about 30 minutes to perform.

## 2015-04-05 ENCOUNTER — Other Ambulatory Visit: Payer: Self-pay

## 2015-04-05 DIAGNOSIS — Z4689 Encounter for fitting and adjustment of other specified devices: Secondary | ICD-10-CM

## 2015-04-05 NOTE — Progress Notes (Signed)
Office note reviewed. Patient well known to me. Laboratories were normal including liver tests. X-ray unremarkable with stent in proper position. Observation for now regarding vague discomfort. If worsens, would recommend CT scan. Keep plans for ERCP as arranged

## 2015-04-08 ENCOUNTER — Ambulatory Visit (HOSPITAL_COMMUNITY)
Admission: RE | Admit: 2015-04-08 | Discharge: 2015-04-08 | Disposition: A | Discharge status: Home / Self Care | Payer: Medicare Other | Source: Ambulatory Visit | Attending: Physician Assistant | Admitting: Physician Assistant

## 2015-04-08 DIAGNOSIS — I700 Atherosclerosis of aorta: Secondary | ICD-10-CM | POA: Insufficient documentation

## 2015-04-08 DIAGNOSIS — I77811 Abdominal aortic ectasia: Secondary | ICD-10-CM | POA: Diagnosis not present

## 2015-04-08 DIAGNOSIS — K8020 Calculus of gallbladder without cholecystitis without obstruction: Secondary | ICD-10-CM | POA: Diagnosis present

## 2015-04-08 DIAGNOSIS — K8050 Calculus of bile duct without cholangitis or cholecystitis without obstruction: Secondary | ICD-10-CM

## 2015-04-26 ENCOUNTER — Encounter (HOSPITAL_COMMUNITY): Payer: Self-pay | Admitting: *Deleted

## 2015-05-03 ENCOUNTER — Ambulatory Visit (HOSPITAL_COMMUNITY): Payer: Medicare Other

## 2015-05-03 ENCOUNTER — Ambulatory Visit (HOSPITAL_COMMUNITY): Payer: Medicare Other | Admitting: Anesthesiology

## 2015-05-03 ENCOUNTER — Ambulatory Visit (HOSPITAL_COMMUNITY)
Admission: RE | Admit: 2015-05-03 | Discharge: 2015-05-03 | Disposition: A | Discharge status: Home / Self Care | Payer: Medicare Other | Source: Ambulatory Visit | Attending: Internal Medicine | Admitting: Internal Medicine

## 2015-05-03 ENCOUNTER — Encounter (HOSPITAL_COMMUNITY)
Admission: RE | Disposition: A | Discharge status: Home / Self Care | Payer: Self-pay | Source: Ambulatory Visit | Attending: Internal Medicine

## 2015-05-03 ENCOUNTER — Encounter (HOSPITAL_COMMUNITY): Payer: Self-pay

## 2015-05-03 DIAGNOSIS — I10 Essential (primary) hypertension: Secondary | ICD-10-CM | POA: Insufficient documentation

## 2015-05-03 DIAGNOSIS — K8051 Calculus of bile duct without cholangitis or cholecystitis with obstruction: Secondary | ICD-10-CM | POA: Insufficient documentation

## 2015-05-03 DIAGNOSIS — K8050 Calculus of bile duct without cholangitis or cholecystitis without obstruction: Secondary | ICD-10-CM | POA: Diagnosis not present

## 2015-05-03 DIAGNOSIS — Z87891 Personal history of nicotine dependence: Secondary | ICD-10-CM | POA: Insufficient documentation

## 2015-05-03 DIAGNOSIS — Z4689 Encounter for fitting and adjustment of other specified devices: Secondary | ICD-10-CM

## 2015-05-03 DIAGNOSIS — Z4659 Encounter for fitting and adjustment of other gastrointestinal appliance and device: Secondary | ICD-10-CM | POA: Diagnosis not present

## 2015-05-03 DIAGNOSIS — E119 Type 2 diabetes mellitus without complications: Secondary | ICD-10-CM | POA: Diagnosis not present

## 2015-05-03 HISTORY — PX: ERCP: SHX5425

## 2015-05-03 SURGERY — ERCP, WITH INTERVENTION IF INDICATED
Anesthesia: Monitor Anesthesia Care

## 2015-05-03 MED ORDER — CIPROFLOXACIN IN D5W 400 MG/200ML IV SOLN
400.0000 mg | Freq: Once | INTRAVENOUS | Status: AC
  Administered 2015-05-03: 400 mg via INTRAVENOUS

## 2015-05-03 MED ORDER — ONDANSETRON HCL 4 MG/2ML IJ SOLN
INTRAMUSCULAR | Status: AC
  Filled 2015-05-03: qty 2

## 2015-05-03 MED ORDER — SODIUM CHLORIDE 0.9 % IV SOLN: INTRAVENOUS | Status: DC

## 2015-05-03 MED ORDER — DEXAMETHASONE SODIUM PHOSPHATE 10 MG/ML IJ SOLN
INTRAMUSCULAR | Status: AC
  Filled 2015-05-03: qty 1

## 2015-05-03 MED ORDER — GLUCAGON HCL RDNA (DIAGNOSTIC) 1 MG IJ SOLR
INTRAMUSCULAR | Status: DC | PRN
  Administered 2015-05-03: .5 mg via INTRAVENOUS

## 2015-05-03 MED ORDER — PROPOFOL 10 MG/ML IV BOLUS
INTRAVENOUS | Status: DC | PRN
  Administered 2015-05-03: 200 mg via INTRAVENOUS

## 2015-05-03 MED ORDER — GLUCAGON HCL RDNA (DIAGNOSTIC) 1 MG IJ SOLR
INTRAMUSCULAR | Status: AC
  Filled 2015-05-03: qty 2

## 2015-05-03 MED ORDER — SODIUM CHLORIDE 0.9 % IV SOLN
1.5000 g | Freq: Once | INTRAVENOUS | Status: DC
  Filled 2015-05-03: qty 1.5

## 2015-05-03 MED ORDER — INDOMETHACIN 50 MG RE SUPP
RECTAL | Status: AC
  Filled 2015-05-03: qty 2

## 2015-05-03 MED ORDER — ONDANSETRON HCL 4 MG/2ML IJ SOLN
INTRAMUSCULAR | Status: DC | PRN
  Administered 2015-05-03: 4 mg via INTRAVENOUS

## 2015-05-03 MED ORDER — SUCCINYLCHOLINE CHLORIDE 20 MG/ML IJ SOLN
INTRAMUSCULAR | Status: DC | PRN
  Administered 2015-05-03: 100 mg via INTRAVENOUS

## 2015-05-03 MED ORDER — CIPROFLOXACIN IN D5W 400 MG/200ML IV SOLN
INTRAVENOUS | Status: AC
  Filled 2015-05-03: qty 200

## 2015-05-03 MED ORDER — LACTATED RINGERS IV SOLN
INTRAVENOUS | Status: DC
  Administered 2015-05-03: 1000 mL via INTRAVENOUS

## 2015-05-03 MED ORDER — SODIUM CHLORIDE 0.9 % IV SOLN
INTRAVENOUS | Status: DC | PRN
  Administered 2015-05-03: 60 mL

## 2015-05-03 MED ORDER — DEXAMETHASONE SODIUM PHOSPHATE 10 MG/ML IJ SOLN
INTRAMUSCULAR | Status: DC | PRN
  Administered 2015-05-03: 10 mg via INTRAVENOUS

## 2015-05-03 MED ORDER — INDOMETHACIN 50 MG RE SUPP
100.0000 mg | Freq: Once | RECTAL | Status: AC
  Administered 2015-05-03: 100 mg via RECTAL

## 2015-05-03 MED ORDER — LIDOCAINE HCL (CARDIAC) 20 MG/ML IV SOLN
INTRAVENOUS | Status: DC | PRN
  Administered 2015-05-03: 100 mg via INTRAVENOUS

## 2015-05-03 MED ORDER — LIDOCAINE HCL (CARDIAC) 20 MG/ML IV SOLN
INTRAVENOUS | Status: AC
  Filled 2015-05-03: qty 5

## 2015-05-03 MED ORDER — PROPOFOL 10 MG/ML IV BOLUS
INTRAVENOUS | Status: AC
  Filled 2015-05-03: qty 20

## 2015-05-03 NOTE — Anesthesia Postprocedure Evaluation (Signed)
  Anesthesia Post-op Note  Patient: Neil [name]  Procedure(s) Performed: Procedure(s): ENDOSCOPIC RETROGRADE CHOLANGIOPANCREATOGRAPHY (ERCP) (N/A)  Patient Location: Endoscopy Unit  Anesthesia Type:General  Level of Consciousness: awake and alert   Airway and Oxygen Therapy: Patient Spontanous Breathing  Post-op Pain: none  Post-op Assessment: Post-op Vital signs reviewed              Post-op Vital Signs: stable  Last Vitals:  Filed Vitals:   05/03/15 1210  BP: 173/70  Pulse: 52  Temp:   Resp: 11    Complications: No apparent anesthesia complications

## 2015-05-03 NOTE — Interval H&P Note (Signed)
History and Physical Interval Note:  05/03/2015 10:15 AM  Neil [name]  has presented today for surgery, with the diagnosis of Common Bile duct stones/ ERCP /stent removal  The various methods of treatment have been discussed with the patient and family. After consideration of risks, benefits and other options for treatment, the patient has consented to  Procedure(s): ENDOSCOPIC RETROGRADE CHOLANGIOPANCREATOGRAPHY (ERCP) (N/A) as a surgical intervention .  The patient's history has been reviewed, patient examined, no change in status, stable for surgery.  I have reviewed the patient's chart and labs.  Questions were answered to the patient's satisfaction.     [name]

## 2015-05-03 NOTE — H&P (View-Only) (Signed)
Office note reviewed. Patient well known to me. Laboratories were normal including liver tests. X-ray unremarkable with stent in proper position. Observation for now regarding vague discomfort. If worsens, would recommend CT scan. Keep plans for ERCP as arranged

## 2015-05-03 NOTE — Op Note (Signed)
[facility] [place], [address]   ERCP PROCEDURE REPORT        EXAM DATE: 05/03/2015  PATIENT NAME:          Neil [name], Neil [name]          MR #: [id number] BIRTHDATE:       December 19, 1940     VISIT #:     [id number] ATTENDING:     Eustace Quail, MD     STATUS:     outpatient ASSISTANT:      Elspeth Cho and Cleda Daub  INDICATIONS:  The patient is a 74 yr old male here for an ERCP due to established bile duct stone(s). PROCEDURE PERFORMED:     ERCP with stent placement ERCP with removal of calculus/calculi MEDICATIONS:     Per Anesthesia  CONSENT: The patient understands the risks and benefits of the procedure and understands that these risks include, but are not limited to: sedation, allergic reaction, infection, perforation and/or bleeding. Alternative means of evaluation and treatment include, among others: physical exam, x-rays, and/or surgical intervention. The patient elects to proceed with this endoscopic procedure.  DESCRIPTION OF PROCEDURE: During intra-op preparation period all mechanical & medical equipment was checked for proper function. Hand hygiene and appropriate measures for infection prevention was taken. After the risks, benefits and alternatives of the procedure were thoroughly explained, Informed was verified, confirmed and timeout was successfully executed by the treatment team. With the patient in left semi-prone position, medications were administered intravenously.The pentax [id number] was passed from the mouth into the esophagus and further advanced from the esophagus into the stomach. From stomach scope was directed to the second portion of the duodenum.  Major papilla was aligned with the duodenoscope. The scope position was confirmed fluoroscopically. Rest of the findings/therapeutics are given below. The scope was then completely withdrawn from the patient and the procedure completed. The pulse, BP, and O2  saturation were monitored and documented by the physician and the nursing staff throughout the entire procedure. The patient was cared for as planned according to standard protocol. The patient was then discharged to recovery in stable condition and with appropriate post procedure care. Estimated blood loss is zero unless otherwise noted in this procedure report.  The Pentax side-viewing endoscope was passed blindly into the esophagus.  Stomach and duodenum were unremarkable.  Previously placed biliary stent was observed.  A scout radiograph of the abdomen with the scope in position revealed the previously placed stent and cholecystectomy clips.  The stent was removed with an endoscopic snare.  Contrast was injected via the previous sphincterotomy. There was no manipulation of the pancreatic duct. Multiple common duct stones measuring between 5 and 10 mm were observed.  These were extracted with multiple balloon sweeps.  Post extraction occlusion cholangiogram revealed no residual filling defects and excellent drainage.    ADVERSE EVENT:     There were no complications. IMPRESSIONS:     1. Choledocholithiasis status post previously placed biliary stent removal followed by balloon clearance of the common duct stones  RECOMMENDATIONS:     1. Standard post ERCP Observation  . Home today unless otherwise indicated REPEAT EXAM:   ___________________________________ Eustace Quail, MD eSigned:  Eustace Quail, MD 05/03/2015 11:28 AM   cc: [name], M.D.  CPT CODES:     1.  [id number] Endoscopic retrograde cholangiopancreatography (ERCP); w/ endoscopic retrograde insertion of tube or stent into bile or pancreatic duct 2.  [id number] Endoscopic retrograde cholangiopancreatography (ERCP); w/ endoscopic retrograde removal of calculus/calculi from biliary and/or pancreatic ducts ICD9 CODES:  The ICD and CPT codes recommended by this software are interpretations from the data that the  clinical staff has captured with the software.  The verification of the translation of this report to the ICD and CPT codes and modifiers is the sole responsibility of the health care institution and practicing physician where this report was generated.  [employer]. will not be held responsible for the validity of the ICD and CPT codes included on this report.  AMA assumes no liability for data contained or not contained herein. CPT is a Designer, television/film set of the [employer].   PATIENT NAME:  Neil [name], Neil [name] MR#: [id number]

## 2015-05-03 NOTE — Discharge Instructions (Signed)
Endoscopic Retrograde Cholangiopancreatography (ERCP), Care After °Refer to this sheet in the next few weeks. These instructions provide you with information on caring for yourself after your procedure. Your health care provider may also give you more specific instructions. Your treatment has been planned according to current medical practices, but problems sometimes occur. Call your health care provider if you have any problems or questions after your procedure.  °WHAT TO EXPECT AFTER THE PROCEDURE  °After your procedure, it is typical to feel:  °· Soreness in your throat.   °· Sick to your stomach (nauseous).   °· Bloated. °· Dizzy.   °· Fatigued. °HOME CARE INSTRUCTIONS °· Have a friend or family member stay with you for the first 24 hours after your procedure. °· Start taking your usual medicines and eating normally as soon as you feel well enough to do so or as directed by your health care provider. °SEEK MEDICAL CARE IF: °· You have abdominal pain.   °· You develop signs of infection, such as:   °¨ Chills.   °¨ Feeling unwell.   °SEEK IMMEDIATE MEDICAL CARE IF: °· You have difficulty swallowing. °· You have worsening throat, chest, or abdominal pain. °· You vomit. °· You have bloody or very black stools. °· You have a fever. °Document Released: 07/22/2013 Document Reviewed: 07/22/2013 °ExitCare® Patient Information ©2015 [employer]. This information is not intended to replace advice given to you by your health care provider. Make sure you discuss any questions you have with your health care provider. ° °

## 2015-05-03 NOTE — Anesthesia Procedure Notes (Signed)
Procedure Name: Intubation Date/Time: 05/03/2015 10:31 AM Performed by: Lind Covert Pre-anesthesia Checklist: Patient identified, Timeout performed, Emergency Drugs available, Suction available and Patient being monitored Patient Re-evaluated:Patient Re-evaluated prior to inductionOxygen Delivery Method: Circle system utilized Preoxygenation: Pre-oxygenation with 100% oxygen Intubation Type: IV induction Laryngoscope Size: Mac and 4 Grade View: Grade I Tube type: Oral Tube size: 7.5 mm Number of attempts: 1 Airway Equipment and Method: Stylet Placement Confirmation: ETT inserted through vocal cords under direct vision and breath sounds checked- equal and bilateral Secured at: 22 cm Tube secured with: Tape Dental Injury: Teeth and Oropharynx as per pre-operative assessment

## 2015-05-03 NOTE — Anesthesia Preprocedure Evaluation (Signed)
Anesthesia Evaluation  Patient identified by MRN, date of birth, ID band Patient awake    Reviewed: Allergy & Precautions, NPO status , Patient's Chart, lab work & pertinent test results  Airway Mallampati: I  TM Distance: >3 FB Neck ROM: Full    Dental  (+) Poor Dentition, Missing   Pulmonary former smoker,  breath sounds clear to auscultation        Cardiovascular hypertension, + dysrhythmias Rhythm:Regular Rate:Normal     Neuro/Psych    GI/Hepatic negative GI ROS, Neg liver ROS,   Endo/Other  diabetes  Renal/GU      Musculoskeletal   Abdominal   Peds  Hematology   Anesthesia Other Findings   Reproductive/Obstetrics                             Anesthesia Physical Anesthesia Plan  ASA: III  Anesthesia Plan: MAC and General   Post-op Pain Management:    Induction: Intravenous  Airway Management Planned: Oral ETT  Additional Equipment:   Intra-op Plan:   Post-operative Plan:   Informed Consent: I have reviewed the patients History and Physical, chart, labs and discussed the procedure including the risks, benefits and alternatives for the proposed anesthesia with the patient or authorized representative who has indicated his/her understanding and acceptance.   Dental advisory given  Plan Discussed with:   Anesthesia Plan Comments:         Anesthesia Quick Evaluation

## 2015-05-03 NOTE — Transfer of Care (Signed)
Immediate Anesthesia Transfer of Care Note  Patient: Neil [name]  Procedure(s) Performed: Procedure(s): ENDOSCOPIC RETROGRADE CHOLANGIOPANCREATOGRAPHY (ERCP) (N/A)  Patient Location: PACU  Anesthesia Type:General  Level of Consciousness:  sedated, patient cooperative and responds to stimulation  Airway & Oxygen Therapy:Patient Spontanous Breathing and Patient connected to face mask oxgen  Post-op Assessment:  Report given to PACU RN and Post -op Vital signs reviewed and stable  Post vital signs:  Reviewed and stable  Last Vitals:  Filed Vitals:   05/03/15 0940  BP: 158/62  Pulse: 62  Temp: 36.5 C  Resp: 21    Complications: No apparent anesthesia complications

## 2015-05-05 ENCOUNTER — Encounter (HOSPITAL_COMMUNITY): Payer: Self-pay | Admitting: Internal Medicine

## 2015-05-23 ENCOUNTER — Encounter: Payer: Self-pay | Admitting: Physician Assistant

## 2015-05-23 ENCOUNTER — Ambulatory Visit (INDEPENDENT_AMBULATORY_CARE_PROVIDER_SITE_OTHER): Payer: Medicare Other | Admitting: Physician Assistant

## 2015-05-23 ENCOUNTER — Encounter (HOSPITAL_COMMUNITY): Payer: Self-pay

## 2015-05-23 ENCOUNTER — Inpatient Hospital Stay (HOSPITAL_COMMUNITY)
Admission: AD | Admit: 2015-05-23 | Discharge: 2015-05-30 | DRG: 442 | Disposition: A | Discharge status: Home / Self Care | Payer: Medicare Other | Source: Ambulatory Visit | Attending: Internal Medicine | Admitting: Internal Medicine

## 2015-05-23 ENCOUNTER — Telehealth: Payer: Self-pay | Admitting: Internal Medicine

## 2015-05-23 VITALS — BP 118/44 | HR 72 | Temp 98.6°F | Ht 67.25 in

## 2015-05-23 DIAGNOSIS — E440 Moderate protein-calorie malnutrition: Secondary | ICD-10-CM | POA: Diagnosis not present

## 2015-05-23 DIAGNOSIS — R630 Anorexia: Secondary | ICD-10-CM | POA: Insufficient documentation

## 2015-05-23 DIAGNOSIS — R509 Fever, unspecified: Secondary | ICD-10-CM | POA: Diagnosis not present

## 2015-05-23 DIAGNOSIS — E1122 Type 2 diabetes mellitus with diabetic chronic kidney disease: Secondary | ICD-10-CM | POA: Diagnosis not present

## 2015-05-23 DIAGNOSIS — R61 Generalized hyperhidrosis: Secondary | ICD-10-CM | POA: Diagnosis present

## 2015-05-23 DIAGNOSIS — I129 Hypertensive chronic kidney disease with stage 1 through stage 4 chronic kidney disease, or unspecified chronic kidney disease: Secondary | ICD-10-CM | POA: Diagnosis present

## 2015-05-23 DIAGNOSIS — D649 Anemia, unspecified: Secondary | ICD-10-CM | POA: Diagnosis present

## 2015-05-23 DIAGNOSIS — R933 Abnormal findings on diagnostic imaging of other parts of digestive tract: Secondary | ICD-10-CM | POA: Diagnosis not present

## 2015-05-23 DIAGNOSIS — K750 Abscess of liver: Secondary | ICD-10-CM | POA: Diagnosis present

## 2015-05-23 DIAGNOSIS — E509 Vitamin A deficiency, unspecified: Secondary | ICD-10-CM | POA: Diagnosis not present

## 2015-05-23 DIAGNOSIS — R110 Nausea: Secondary | ICD-10-CM

## 2015-05-23 DIAGNOSIS — I10 Essential (primary) hypertension: Secondary | ICD-10-CM | POA: Diagnosis not present

## 2015-05-23 DIAGNOSIS — K830 Cholangitis: Secondary | ICD-10-CM

## 2015-05-23 DIAGNOSIS — N189 Chronic kidney disease, unspecified: Secondary | ICD-10-CM | POA: Diagnosis present

## 2015-05-23 DIAGNOSIS — R1011 Right upper quadrant pain: Secondary | ICD-10-CM | POA: Diagnosis not present

## 2015-05-23 DIAGNOSIS — B954 Other streptococcus as the cause of diseases classified elsewhere: Secondary | ICD-10-CM | POA: Diagnosis not present

## 2015-05-23 DIAGNOSIS — B9689 Other specified bacterial agents as the cause of diseases classified elsewhere: Secondary | ICD-10-CM | POA: Diagnosis present

## 2015-05-23 DIAGNOSIS — E119 Type 2 diabetes mellitus without complications: Secondary | ICD-10-CM | POA: Diagnosis not present

## 2015-05-23 DIAGNOSIS — K5900 Constipation, unspecified: Secondary | ICD-10-CM | POA: Diagnosis not present

## 2015-05-23 DIAGNOSIS — K8050 Calculus of bile duct without cholangitis or cholecystitis without obstruction: Secondary | ICD-10-CM | POA: Diagnosis not present

## 2015-05-23 DIAGNOSIS — I9589 Other hypotension: Secondary | ICD-10-CM

## 2015-05-23 DIAGNOSIS — Z9049 Acquired absence of other specified parts of digestive tract: Secondary | ICD-10-CM | POA: Diagnosis not present

## 2015-05-23 DIAGNOSIS — D72829 Elevated white blood cell count, unspecified: Secondary | ICD-10-CM | POA: Diagnosis not present

## 2015-05-23 DIAGNOSIS — A419 Sepsis, unspecified organism: Secondary | ICD-10-CM

## 2015-05-23 DIAGNOSIS — N179 Acute kidney failure, unspecified: Secondary | ICD-10-CM | POA: Diagnosis not present

## 2015-05-23 DIAGNOSIS — R932 Abnormal findings on diagnostic imaging of liver and biliary tract: Secondary | ICD-10-CM | POA: Diagnosis not present

## 2015-05-23 DIAGNOSIS — E785 Hyperlipidemia, unspecified: Secondary | ICD-10-CM | POA: Diagnosis not present

## 2015-05-23 DIAGNOSIS — R634 Abnormal weight loss: Secondary | ICD-10-CM | POA: Diagnosis not present

## 2015-05-23 DIAGNOSIS — K8064 Calculus of gallbladder and bile duct with chronic cholecystitis without obstruction: Secondary | ICD-10-CM | POA: Diagnosis not present

## 2015-05-23 DIAGNOSIS — K810 Acute cholecystitis: Secondary | ICD-10-CM | POA: Diagnosis not present

## 2015-05-23 DIAGNOSIS — I714 Abdominal aortic aneurysm, without rupture: Secondary | ICD-10-CM | POA: Diagnosis not present

## 2015-05-23 DIAGNOSIS — A491 Streptococcal infection, unspecified site: Secondary | ICD-10-CM | POA: Insufficient documentation

## 2015-05-23 HISTORY — DX: Abscess of liver: K75.0

## 2015-05-23 LAB — URINE MICROSCOPIC-ADD ON

## 2015-05-23 LAB — URINALYSIS, ROUTINE W REFLEX MICROSCOPIC
Glucose, UA: NEGATIVE mg/dL
Hgb urine dipstick: NEGATIVE
Ketones, ur: NEGATIVE mg/dL
Leukocytes, UA: NEGATIVE
Nitrite: NEGATIVE
Protein, ur: 30 mg/dL — AB
Specific Gravity, Urine: 1.027 (ref 1.005–1.030)
Urobilinogen, UA: 1 mg/dL (ref 0.0–1.0)
pH: 5.5 (ref 5.0–8.0)

## 2015-05-23 LAB — COMPREHENSIVE METABOLIC PANEL
ALT: 45 U/L (ref 17–63)
AST: 56 U/L — ABNORMAL HIGH (ref 15–41)
Albumin: 2.9 g/dL — ABNORMAL LOW (ref 3.5–5.0)
Alkaline Phosphatase: 71 U/L (ref 38–126)
Anion gap: 8 (ref 5–15)
BUN: 33 mg/dL — ABNORMAL HIGH (ref 6–20)
CO2: 26 mmol/L (ref 22–32)
Calcium: 9.3 mg/dL (ref 8.9–10.3)
Chloride: 102 mmol/L (ref 101–111)
Creatinine, Ser: 1.67 mg/dL — ABNORMAL HIGH (ref 0.61–1.24)
GFR calc Af Amer: 45 mL/min — ABNORMAL LOW (ref >60)
GFR calc non Af Amer: 39 mL/min — ABNORMAL LOW (ref >60)
Glucose, Bld: 145 mg/dL — ABNORMAL HIGH (ref 65–99)
Potassium: 3.7 mmol/L (ref 3.5–5.1)
Sodium: 136 mmol/L (ref 135–145)
Total Bilirubin: 0.8 mg/dL (ref 0.3–1.2)
Total Protein: 7.5 g/dL (ref 6.5–8.1)

## 2015-05-23 LAB — CBC
HCT: 31.8 % — ABNORMAL LOW (ref 39.0–52.0)
Hemoglobin: 10.2 g/dL — ABNORMAL LOW (ref 13.0–17.0)
MCH: 27.5 pg (ref 26.0–34.0)
MCHC: 32.1 g/dL (ref 30.0–36.0)
MCV: 85.7 fL (ref 78.0–100.0)
Platelets: 545 10*3/uL — ABNORMAL HIGH (ref 150–400)
RBC: 3.71 MIL/uL — ABNORMAL LOW (ref 4.22–5.81)
RDW: 14.6 % (ref 11.5–15.5)
WBC: 20 10*3/uL — ABNORMAL HIGH (ref 4.0–10.5)

## 2015-05-23 LAB — LIPASE, BLOOD: Lipase: 23 U/L (ref 22–51)

## 2015-05-23 LAB — PROTIME-INR
INR: 1.25 (ref 0.00–1.49)
Prothrombin Time: 15.9 seconds — ABNORMAL HIGH (ref 11.6–15.2)

## 2015-05-23 MED ORDER — SODIUM CHLORIDE 0.9 % IV BOLUS (SEPSIS)
1000.0000 mL | Freq: Once | INTRAVENOUS | Status: AC
  Administered 2015-05-24: 1000 mL via INTRAVENOUS

## 2015-05-23 MED ORDER — PIPERACILLIN-TAZOBACTAM 3.375 G IVPB
3.3750 g | Freq: Four times a day (QID) | INTRAVENOUS | Status: DC
  Filled 2015-05-23: qty 50

## 2015-05-23 MED ORDER — HYDROMORPHONE HCL 1 MG/ML IJ SOLN
0.5000 mg | INTRAMUSCULAR | Status: DC | PRN
  Administered 2015-05-24 – 2015-05-25 (×2): 0.5 mg via INTRAVENOUS
  Filled 2015-05-23 (×2): qty 1

## 2015-05-23 MED ORDER — ONDANSETRON HCL 4 MG/2ML IJ SOLN: 4.0000 mg | Freq: Four times a day (QID) | INTRAMUSCULAR | Status: DC | PRN

## 2015-05-23 MED ORDER — PIPERACILLIN-TAZOBACTAM 3.375 G IVPB
3.3750 g | Freq: Three times a day (TID) | INTRAVENOUS | Status: DC
  Administered 2015-05-24 – 2015-05-27 (×11): 3.375 g via INTRAVENOUS
  Filled 2015-05-23 (×11): qty 50

## 2015-05-23 MED ORDER — PIPERACILLIN-TAZOBACTAM 3.375 G IVPB
3.3750 g | Freq: Once | INTRAVENOUS | Status: AC
  Administered 2015-05-23: 3.375 g via INTRAVENOUS
  Filled 2015-05-23: qty 50

## 2015-05-23 MED ORDER — SODIUM CHLORIDE 0.9 % IV SOLN
INTRAVENOUS | Status: DC
  Administered 2015-05-23: 19:00:00 via INTRAVENOUS

## 2015-05-23 MED ORDER — ONDANSETRON HCL 4 MG PO TABS: 4.0000 mg | ORAL_TABLET | Freq: Four times a day (QID) | ORAL | Status: DC | PRN

## 2015-05-23 MED ORDER — ACETAMINOPHEN 325 MG PO TABS
650.0000 mg | ORAL_TABLET | Freq: Four times a day (QID) | ORAL | Status: DC | PRN
  Administered 2015-05-24: 650 mg via ORAL
  Filled 2015-05-23: qty 2

## 2015-05-23 MED ORDER — ACETAMINOPHEN 650 MG RE SUPP: 650.0000 mg | Freq: Four times a day (QID) | RECTAL | Status: DC | PRN

## 2015-05-23 MED ORDER — KCL IN DEXTROSE-NACL 10-5-0.45 MEQ/L-%-% IV SOLN
INTRAVENOUS | Status: DC
  Filled 2015-05-23 (×7): qty 1000

## 2015-05-23 MED ORDER — HYDROCODONE-ACETAMINOPHEN 5-325 MG PO TABS
1.0000 | ORAL_TABLET | ORAL | Status: DC | PRN
  Administered 2015-05-24 – 2015-05-26 (×11): 2 via ORAL
  Administered 2015-05-27: 1 via ORAL
  Administered 2015-05-27 – 2015-05-29 (×10): 2 via ORAL
  Administered 2015-05-29: 1 via ORAL
  Administered 2015-05-29 (×2): 2 via ORAL
  Administered 2015-05-29: 1 via ORAL
  Administered 2015-05-30: 2 via ORAL
  Filled 2015-05-23 (×2): qty 2
  Filled 2015-05-23: qty 1
  Filled 2015-05-23 (×3): qty 2
  Filled 2015-05-23: qty 1
  Filled 2015-05-23 (×3): qty 2
  Filled 2015-05-23: qty 1
  Filled 2015-05-23 (×5): qty 2
  Filled 2015-05-23: qty 1
  Filled 2015-05-23 (×7): qty 2
  Filled 2015-05-23: qty 1
  Filled 2015-05-23 (×2): qty 2

## 2015-05-23 NOTE — Patient Instructions (Signed)
Please go to [facility] Admitting

## 2015-05-23 NOTE — Telephone Encounter (Signed)
Pt states that since he had the stent removed he has been having abdominal pain more on the right side, bloating, not being able to eat much, diarrhea, and fevers/chills. Pt scheduled to see Nicoletta Ba PA today at 3pm.

## 2015-05-23 NOTE — Progress Notes (Signed)
Patient ID: Neil [name], male   DOB: 19-Feb-1941, 74 y.o.   MRN: [id number] Patient seen in the office this afternoon admitted to [facility] with complaints of upper abdominal pain, fever or chills, nausea and with hypotension noted in the office. Concerned with bacteremia and possible underlying cholangitis. For full details please see the dictated history and physical.

## 2015-05-23 NOTE — Progress Notes (Signed)
Upon leaving, patient voiced desire to drive over to the hospital. I advised him that this was not a good idea but he stated that he did not see why he wouldn't be able to drive and that he would drive over. I reiterated that he should not do so.

## 2015-05-23 NOTE — H&P (Signed)
Admission Note  Primary Care Physician:  [name], MD Primary Gastroenterologist:    [name]  HPI: Neil [name] is a 74 y.o. male  admitted today from the office with complaints of upper abdominal pain, fever, shaking chills and sweats over the past 72 hours and mild hypotension documented in the office with blood pressure of 90/50. Patient had undergone ERCP, sphincterotomy and removal of numerous common bile duct stones and then placement of a biliary stent because of residual stone debris on 03/01/2015. The following day he underwent laparoscopic cholecystectomy per Dr. Dalbert Batman. He did well was discharged to home. He was seen back in the office on June 17. He says he did well for about a week after surgery and then developed some right upper quadrant pain which she had been noticing primarily in the evenings while sitting in his recliner. He also said he was having some discomfort lying on his right side at night. Labs were done and hepatic panel was unremarkable. KUB showed his stent to be in position as did upper abdominal ultrasound. He was then scheduled for ERCP and stent removal with [name] on July 19. At ERCP he was found to have multiple common bile duct stones measuring between 5 and 10 mm extracted with multiple balloon sweeps, stent was removed. Post extraction occlusion cholangiogram showed no residual filling defects and excellent drainage. Patient was discharged to home. He says he kept trying to convince himself that he was feeling better but really has not felt well since then and has been significantly worse over the past week or so. He says this past weekend his symptoms "jumped all over him". In feeling poorly in general and hadn't had much appetite and had developed some mild recurrent right upper quadrant pain. Now over the past 72 hours or so he has had recurrent fevers shaking chills and sweats. He said he had very dark urine on Saturday 86 and has not noticed his  urine as dark since then. He has not been eating has no appetite says he tries to eat and then becomes nauseated but has not vomited. He feels weak. Patient otherwise in fairly good health does have history of hypertension and BPH. He has been taking his medications.   Past Medical History  Diagnosis Date  . ALLERGIC RHINITIS 12/04/2007  . BENIGN PROSTATIC HYPERTROPHY 06/05/2007  . BUNDLE BRANCH BLOCK, RIGHT 07/21/2009  . BURSITIS, RIGHT SHOULDER 11/06/2010  . COLONIC POLYPS, HX OF 06/05/2007    tubular adenoma also 02/19/2013  . DIVERTICULOSIS, COLON 07/21/2009  . ERECTILE DYSFUNCTION 06/05/2007  . HYPERLIPIDEMIA 06/05/2007  . HYPERTENSION 06/05/2007  . INGUINAL HERNIA, RIGHT, SMALL 11/06/2010  . SKIN LESION 06/03/2008  . DIABETES MELLITUS, TYPE II 12/04/2007    pt states not diabetic-no meds 02-05-13 PV  . Gallstones     Past Surgical History  Procedure Laterality Date  . Colonoscopy    . Polypectomy    . Hemorrhoid surgery    . Finger surgery Left   . Tonsillectomy    . Inguinal hernia repair Right     8'14 repair  . Ercp N/A 03/01/2015    Procedure: ENDOSCOPIC RETROGRADE CHOLANGIOPANCREATOGRAPHY (ERCP);  Surgeon: Irene Shipper, MD;  Location: [facility];  Service: Endoscopy;  Laterality: N/A;  Dr Dalbert Batman wants patient to stay overnight after ERCP  . Cholecystectomy N/A 03/02/2015    Procedure: LAPAROSCOPIC CHOLECYSTECTOMY;  Surgeon: Fanny Skates, MD;  Location: [facility];  Service: General;  Laterality: N/A;  .  Ercp N/A 05/03/2015    Procedure: ENDOSCOPIC RETROGRADE CHOLANGIOPANCREATOGRAPHY (ERCP);  Surgeon: Irene Shipper, MD;  Location: [facility];  Service: Endoscopy;  Laterality: N/A;    Prior to Admission medications   Medication Sig Start Date End Date Taking? Authorizing Provider  acetaminophen (TYLENOL) 500 MG tablet Take 1,500 mg by mouth 2 (two) times daily.     Historical Provider, MD  benazepril (LOTENSIN) 40 MG tablet Take 1 tablet (40 mg total) by mouth daily. 03/10/15    Biagio Borg, MD  fenofibrate 160 MG tablet Take 1 tablet (160 mg total) by mouth at bedtime. 03/10/15   Biagio Borg, MD  sodium chloride (OCEAN) 0.65 % nasal spray Place 1 spray into the nose daily as needed for congestion.     Historical Provider, MD  terazosin (HYTRIN) 2 MG capsule Take 1 capsule (2 mg total) by mouth once. Patient taking differently: Take 2 mg by mouth daily.  03/10/15   Biagio Borg, MD    Current Facility-Administered Medications  Medication Dose Route Frequency Provider Last Rate Last Dose  . 0.9 %  sodium chloride infusion   Intravenous Continuous Willia Craze, NP      . acetaminophen (TYLENOL) tablet 650 mg  650 mg Oral Q6H PRN Amy S Esterwood, PA-C       Or  . acetaminophen (TYLENOL) suppository 650 mg  650 mg Rectal Q6H PRN Amy S Esterwood, PA-C      . dextrose 5 % and 0.45 % NaCl with KCl 10 mEq/L infusion   Intravenous Continuous Amy S Esterwood, PA-C      . HYDROcodone-acetaminophen (NORCO/VICODIN) 5-325 MG per tablet 1-2 tablet  1-2 tablet Oral Q4H PRN Amy S Esterwood, PA-C      . HYDROmorphone (DILAUDID) injection 0.5 mg  0.5 mg Intravenous Q4H PRN Amy S Esterwood, PA-C      . ondansetron (ZOFRAN) tablet 4 mg  4 mg Oral Q6H PRN Amy S Esterwood, PA-C       Or  . ondansetron (ZOFRAN) injection 4 mg  4 mg Intravenous Q6H PRN Amy S Esterwood, PA-C      . piperacillin-tazobactam (ZOSYN) IVPB 3.375 g  3.375 g Intravenous 4 times per day Amy S Esterwood, PA-C      . sodium chloride 0.9 % bolus 1,000 mL  1,000 mL Intravenous Once Amy S Esterwood, PA-C        Allergies as of 05/23/2015 - Review Complete 05/23/2015  Allergen Reaction Noted  . Ezetimibe Other (See Comments) 07/21/2009  . Statins  12/04/2007    Family History  Problem Relation Age of Onset  . Colon cancer Mother 20  . Brain cancer Mother 31    mets from colon cancer  . Rectal cancer Neg Hx   . Stomach cancer Neg Hx     History   Social History  . Marital Status: Married    Spouse  Name: N/A  . Number of Children: N/A  . Years of Education: N/A   Occupational History  . retired    Social History Main Topics  . Smoking status: Former Smoker    Quit date: 10/15/2005  . Smokeless tobacco: Never Used  . Alcohol Use: 0.0 oz/week    0 Standard drinks or equivalent per week     Comment: rare  . Drug Use: No  . Sexual Activity: Not on file   Other Topics Concern  . Not on file   Social History Narrative    Review  of Systems:  Pertinent positive and negative review of systems were noted in the above HPI section.  All other review of systems was otherwise negative.[name]  Physical Exam: Vital signs in last 24 hours: Temp:  [97.6 F (36.4 C)-98.6 F (37 C)] 97.6 F (36.4 C) (08/08 1637) Pulse Rate:  [66-72] 66 (08/08 1637) BP: (110-118)/(44-56) 110/56 mmHg (08/08 1637) SpO2:  [100 %] 100 % (08/08 1637) Last BM Date: 05/23/15 General:  Pleasant, well-developed, white male   in NAD, ill appearing Head:  Normocephalic and atraumatic. Eyes:  Sclera clear, ? early icterus.   Conjunctiva pink. Ears:  Normal auditory acuity. Mouth:  No deformity or lesions.  Neck:  Supple; no masses . Lungs:  Clear throughout to auscultation.   No wheezes, crackles, or rhonchi. No acute distress. Heart:  Regular rate and rhythm; no murmurs. Abdomen:  Soft, nondistended, tender epigastrium and RUQ . No masses, hepatomegaly. No obvious masses.  Normal bowel .    Rectal:not done  Msk:  Symmetrical without gross deformities.. Pulses:  Normal pulses noted. Extremities:  Without edema. Neurologic:  Alert and  oriented x4;  grossly normal neurologically. Skin:  Intact without significant lesions or rashes. Cervical Nodes:  No significant cervical adenopathy. Psych:  Alert and cooperative. Normal mood and affect.  Lab Results: No results for input(s): WBC, HGB, HCT, PLT in the last 72 hours. BMET No results for input(s): NA, K, CL, CO2, GLUCOSE, BUN, CREATININE, CALCIUM in the last  72 hours. LFT No results for input(s): PROT, ALBUMIN, AST, ALT, ALKPHOS, BILITOT, BILIDIR, IBILI in the last 72 hours. PT/INR No results for input(s): LABPROT, INR in the last 72 hours. Hepatitis Panel No results for input(s): HEPBSAG, HCVAB, HEPAIGM, HEPBIGM in the last 72 hours.    Impression / Plan:  #2 74 year old white male with 72 hour history of fever, shaking chills and sweats, weakness and anorexia upper abdominal discomfort which has been present for the past 2 weeks but worse over the past week. This is in the setting of history of choledocholithiasis. He had initial ERCP and stone extraction and stent placement in May 2016 followed by laparoscopic cholecystectomy then repeat ERCP, stent removal and extraction of multiple common bile duct stones on 05/03/2015. Will need to rule out cholangitis, and hepatic abscess, doubt pancreatitis but cannot rule out. #2 history of hypertension #3 diverticulosis  Plan; discussed with Dr. Carlean Purl who is attending Hospital service this week, and Dr. Avie Echevaria. Patient will be admitted to the GI service. We'll plan fluid bolus and IV fluid volume replacement, blood cultures, baseline labs We'll start IV Zosyn Plan MRI abdomen/MRCP this evening, Further plans and indications for and ascorbic intervention based on labs and MRI results. Hold Hytrin and hold Lotensin.      LOS: 0 days   Amy Esterwood  05/23/2015, 4:55 PM   [facility] Attending  I have also seen and assessed the patient and agree with the advanced practitioner's assessment and plan. My Hx and PE same.  Could need ERCP and cholangioscopy pending MR study  Gatha Mayer, MD, [facility] [phone] (pager) 05/23/2015 6:03 PM

## 2015-05-24 ENCOUNTER — Inpatient Hospital Stay (HOSPITAL_COMMUNITY): Payer: Medicare Other

## 2015-05-24 ENCOUNTER — Encounter (HOSPITAL_COMMUNITY): Payer: Self-pay | Admitting: Internal Medicine

## 2015-05-24 DIAGNOSIS — K750 Abscess of liver: Secondary | ICD-10-CM | POA: Insufficient documentation

## 2015-05-24 DIAGNOSIS — N179 Acute kidney failure, unspecified: Secondary | ICD-10-CM | POA: Diagnosis present

## 2015-05-24 DIAGNOSIS — E440 Moderate protein-calorie malnutrition: Secondary | ICD-10-CM | POA: Insufficient documentation

## 2015-05-24 DIAGNOSIS — R1011 Right upper quadrant pain: Secondary | ICD-10-CM

## 2015-05-24 DIAGNOSIS — D72829 Elevated white blood cell count, unspecified: Secondary | ICD-10-CM

## 2015-05-24 DIAGNOSIS — I10 Essential (primary) hypertension: Secondary | ICD-10-CM | POA: Insufficient documentation

## 2015-05-24 DIAGNOSIS — Z9049 Acquired absence of other specified parts of digestive tract: Secondary | ICD-10-CM

## 2015-05-24 DIAGNOSIS — E509 Vitamin A deficiency, unspecified: Secondary | ICD-10-CM

## 2015-05-24 DIAGNOSIS — D649 Anemia, unspecified: Secondary | ICD-10-CM | POA: Diagnosis present

## 2015-05-24 LAB — COMPREHENSIVE METABOLIC PANEL WITH GFR
ALT: 40 U/L (ref 17–63)
AST: 50 U/L — ABNORMAL HIGH (ref 15–41)
Albumin: 2.6 g/dL — ABNORMAL LOW (ref 3.5–5.0)
Alkaline Phosphatase: 68 U/L (ref 38–126)
Anion gap: 8 (ref 5–15)
BUN: 27 mg/dL — ABNORMAL HIGH (ref 6–20)
CO2: 23 mmol/L (ref 22–32)
Calcium: 8.8 mg/dL — ABNORMAL LOW (ref 8.9–10.3)
Chloride: 106 mmol/L (ref 101–111)
Creatinine, Ser: 1.33 mg/dL — ABNORMAL HIGH (ref 0.61–1.24)
GFR calc Af Amer: 60 mL/min — ABNORMAL LOW
GFR calc non Af Amer: 51 mL/min — ABNORMAL LOW
Glucose, Bld: 141 mg/dL — ABNORMAL HIGH (ref 65–99)
Potassium: 3.7 mmol/L (ref 3.5–5.1)
Sodium: 137 mmol/L (ref 135–145)
Total Bilirubin: 0.9 mg/dL (ref 0.3–1.2)
Total Protein: 6.7 g/dL (ref 6.5–8.1)

## 2015-05-24 LAB — CBC
HCT: 31.1 % — ABNORMAL LOW (ref 39.0–52.0)
Hemoglobin: 10.1 g/dL — ABNORMAL LOW (ref 13.0–17.0)
MCH: 27.4 pg (ref 26.0–34.0)
MCHC: 32.5 g/dL (ref 30.0–36.0)
MCV: 84.3 fL (ref 78.0–100.0)
Platelets: 414 K/uL — ABNORMAL HIGH (ref 150–400)
RBC: 3.69 MIL/uL — ABNORMAL LOW (ref 4.22–5.81)
RDW: 14.7 % (ref 11.5–15.5)
WBC: 19.6 K/uL — ABNORMAL HIGH (ref 4.0–10.5)

## 2015-05-24 MED ORDER — MIDAZOLAM HCL 2 MG/2ML IJ SOLN
INTRAMUSCULAR | Status: AC
  Filled 2015-05-24: qty 6

## 2015-05-24 MED ORDER — GADOBENATE DIMEGLUMINE 529 MG/ML IV SOLN
15.0000 mL | Freq: Once | INTRAVENOUS | Status: AC | PRN
  Administered 2015-05-24: 14 mL via INTRAVENOUS

## 2015-05-24 MED ORDER — FENTANYL CITRATE (PF) 100 MCG/2ML IJ SOLN
INTRAMUSCULAR | Status: AC
  Filled 2015-05-24: qty 4

## 2015-05-24 MED ORDER — FENTANYL CITRATE (PF) 100 MCG/2ML IJ SOLN
INTRAMUSCULAR | Status: AC | PRN
  Administered 2015-05-24: 50 ug via INTRAVENOUS

## 2015-05-24 MED ORDER — MIDAZOLAM HCL 2 MG/2ML IJ SOLN
INTRAMUSCULAR | Status: AC | PRN
  Administered 2015-05-24 (×3): 1 mg via INTRAVENOUS

## 2015-05-24 NOTE — Progress Notes (Addendum)
Progress Note   Subjective  feels about the same as yesterday. No specific complaints. IV made too much noise, didn't sleep well   Objective   Vital signs in last 24 hours: Temp:  [97.6 F (36.4 C)-101.9 F (38.8 C)] 101.7 F (38.7 C) (08/09 0602) Pulse Rate:  [66-91] 78 (08/09 0602) Resp:  [16-18] 18 (08/09 0602) BP: (110-146)/(44-62) 138/57 mmHg (08/09 0602) SpO2:  [96 %-100 %] 96 % (08/09 0602) Weight:  [153 lb 14.1 oz (69.8 kg)] 153 lb 14.1 oz (69.8 kg) (08/08 1637) Last BM Date: 05/23/15 General:    white male in NAD Heart:  Regular rate and rhythm Lungs: Respirations even and unlabored, lungs CTA bilaterally Abdomen:  Soft, mild RUQ tenderness, nondistended. Normal bowel sounds. Extremities:  Without edema. Neurologic:  Alert and oriented,  grossly normal neurologically. Psych:  Cooperative. Normal mood and affect.  Intake/Output from previous day: 08/08 0701 - 08/09 0700 In: 290 [P.O.:240; IV Piggyback:50] Out: 400 [Urine:400]     Lab Results:  Recent Labs  05/23/15 1135 05/24/15 0603  WBC 20.0* 19.6*  HGB 10.2* 10.1*  HCT 31.8* 31.1*  PLT 545* 414*   BMET  Recent Labs  05/23/15 1135 05/24/15 0603  NA 136 137  K 3.7 3.7  CL 102 106  CO2 26 23  GLUCOSE 145* 141*  BUN 33* 27*  CREATININE 1.67* 1.33*  CALCIUM 9.3 8.8*   LFT  Recent Labs  05/24/15 0603  PROT 6.7  ALBUMIN 2.6*  AST 50*  ALT 40  ALKPHOS 68  BILITOT 0.9     Studies/Results: Mr 3d Recon At Scanner  05/24/2015   CLINICAL DATA:  74 year old male inpatient with a history of cholecystectomy on 03/02/15 requiring biliary stent placement and subsequent removal on 05/03/15, presents with fever, shaking chills, sweats, weakness and upper abdominal pain for 2 weeks.  EXAM: MRI ABDOMEN WITHOUT AND WITH CONTRAST (INCLUDING MRCP)  TECHNIQUE: Multiplanar multisequence MR imaging of the abdomen was performed both before and after the administration of intravenous contrast. Heavily  T2-weighted images of the biliary and pancreatic ducts were obtained, and three-dimensional MRCP images were rendered by post processing.  CONTRAST:  43mL MULTIHANCE GADOBENATE DIMEGLUMINE 529 MG/ML IV SOLN  COMPARISON:  02/11/2015 CT abdomen/pelvis. 04/08/2015 abdominal sonogram.  FINDINGS: Lower chest:  There is mild bibasilar atelectasis.  Hepatobiliary: There is a complex multilocular fluid collection centered within segment 4B of the liver measuring 8.1 x 7.9 x 10.2 cm, which is new since the 04/08/15 ultrasound and demonstrates a thick enhancing wall, thick enhancing internal septations and internal restricted diffusion, in keeping with a liver abscess. No additional liver fluid collections or masses. There is no convincing evidence of background hepatic steatosis on chemical shift imaging. The gallbladder is surgically absent. There is mild diffuse intrahepatic biliary ductal dilatation, similar to the 02/11/2015 CT study. The common bile duct is mildly to moderately dilated, measuring 9 mm in diameter. There are a few scattered low signal defects in the nondependent portion of the common bile duct and within the nondependent intrahepatic biliary tree in the left liver lobe, likely representing pneumobilia. There is enhancement of the wall of the common bile duct, suggesting cholangitis. There is smooth narrowing of the common bile duct at the porta hepatis, likely due to mass effect from the adjacent liver abscess. No round filling defects are seen within the biliary tree to suggest choledocholithiasis.  Pancreas: Normal pancreas, with no pancreatic mass, no main pancreatic duct dilation and no pancreatic  or peripancreatic fluid collections. No definite evidence of pancreas divisum, although assessment for divisum is limited by motion artifact.  Spleen: Normal.  Adrenals/Urinary Tract: Normal adrenals. Normal kidneys, with no renal mass and no hydronephrosis.  Stomach/Bowel: Collapsed and grossly normal  stomach. Visualized small and large bowel are normal caliber.  Vascular/Lymphatic: There is a stable penetrating atherosclerotic ulcer his measuring 1.5 x 1.1 cm in the right posterior infrarenal abdominal aortic wall (series 1105/ image 74). Patent portal, splenic, hepatic and renal veins. Retroaortic left renal vein. No abdominal lymphadenopathy.  Other: No ascites.  Musculoskeletal: No gross suspicious focal osseous lesion.  IMPRESSION: 1. New 10.2 cm multilocular liver abscess centered in segment 4B. 2. Enhancing common bile duct wall, suggesting cholangitis. 3. Pneumobilia, likely due to a history of sphincterotomy. 4. Smooth narrowing of the common bile duct at the porta hepatis, likely due to mass effect from the adjacent liver abscess. No appreciable choledocholithiasis. 5. 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  These results were called by telephone at the time of interpretation on 05/24/2015 at 8:16 am to Dr. Nicoletta Ba , who verbally acknowledged these results.   Electronically Signed   By: Ilona Sorrel M.D.   On: 05/24/2015 08:28   Mr [name] W/wo Cm/mrcp  05/24/2015   CLINICAL DATA:  74 year old male inpatient with a history of cholecystectomy on 03/02/15 requiring biliary stent placement and subsequent removal on 05/03/15, presents with fever, shaking chills, sweats, weakness and upper abdominal pain for 2 weeks.  EXAM: MRI ABDOMEN WITHOUT AND WITH CONTRAST (INCLUDING MRCP)  TECHNIQUE: Multiplanar multisequence MR imaging of the abdomen was performed both before and after the administration of intravenous contrast. Heavily T2-weighted images of the biliary and pancreatic ducts were obtained, and three-dimensional MRCP images were rendered by post processing.  CONTRAST:  93mL MULTIHANCE GADOBENATE DIMEGLUMINE 529 MG/ML IV SOLN  COMPARISON:  02/11/2015 CT abdomen/pelvis. 04/08/2015 abdominal sonogram.  FINDINGS: Lower chest:  There is mild bibasilar atelectasis.   Hepatobiliary: There is a complex multilocular fluid collection centered within segment 4B of the liver measuring 8.1 x 7.9 x 10.2 cm, which is new since the 04/08/15 ultrasound and demonstrates a thick enhancing wall, thick enhancing internal septations and internal restricted diffusion, in keeping with a liver abscess. No additional liver fluid collections or masses. There is no convincing evidence of background hepatic steatosis on chemical shift imaging. The gallbladder is surgically absent. There is mild diffuse intrahepatic biliary ductal dilatation, similar to the 02/11/2015 CT study. The common bile duct is mildly to moderately dilated, measuring 9 mm in diameter. There are a few scattered low signal defects in the nondependent portion of the common bile duct and within the nondependent intrahepatic biliary tree in the left liver lobe, likely representing pneumobilia. There is enhancement of the wall of the common bile duct, suggesting cholangitis. There is smooth narrowing of the common bile duct at the porta hepatis, likely due to mass effect from the adjacent liver abscess. No round filling defects are seen within the biliary tree to suggest choledocholithiasis.  Pancreas: Normal pancreas, with no pancreatic mass, no main pancreatic duct dilation and no pancreatic or peripancreatic fluid collections. No definite evidence of pancreas divisum, although assessment for divisum is limited by motion artifact.  Spleen: Normal.  Adrenals/Urinary Tract: Normal adrenals. Normal kidneys, with no renal mass and no hydronephrosis.  Stomach/Bowel: Collapsed and grossly normal stomach. Visualized small and large bowel are normal caliber.  Vascular/Lymphatic: There is a stable penetrating  atherosclerotic ulcer his measuring 1.5 x 1.1 cm in the right posterior infrarenal abdominal aortic wall (series 1105/ image 74). Patent portal, splenic, hepatic and renal veins. Retroaortic left renal vein. No abdominal lymphadenopathy.   Other: No ascites.  Musculoskeletal: No gross suspicious focal osseous lesion.  IMPRESSION: 1. New 10.2 cm multilocular liver abscess centered in segment 4B. 2. Enhancing common bile duct wall, suggesting cholangitis. 3. Pneumobilia, likely due to a history of sphincterotomy. 4. Smooth narrowing of the common bile duct at the porta hepatis, likely due to mass effect from the adjacent liver abscess. No appreciable choledocholithiasis. 5. 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  These results were called by telephone at the time of interpretation on 05/24/2015 at 8:16 am to Dr. Nicoletta Ba , who verbally acknowledged these results.   Electronically Signed   By: Ilona Sorrel M.D.   On: 05/24/2015 08:28     Assessment / Plan:    42. 74 year old male admitted yesterday with anorexia / chills and generally feeling ill since ERCP with CBD stent removal and extraction of multiple bile duct stones 05/03/15.  MRCP shows liver abscesses / possible cholangitis. Febrile today. I asked RN to give Tylenol. Interventional Radiology to evaluate. I have a call into Infectious Disease as well. On IV Zosyn for now.   2. Acute renal insuff, improved. Continue IV fluids.   3. Normocytic anemia without signs of bleeding.   4. Hypertension - off home meds - benazapril probably related to AKI. Watch BP and restart soon.     LOS: 1 day   Tye Savoy  05/24/2015, 8:54 AM   [facility] Attending  I have also seen and assessed the patient and agree with the advanced practitioner's assessment and plan. My hx and PE same. Liver abscess is the problem. Steps underway for drainage by IR and ID advice. Further plans pending that.  Gatha Mayer, MD, [facility] [phone] (pager) 05/24/2015 12:55 PM

## 2015-05-24 NOTE — Procedures (Signed)
GB fossa abscess drain 10 fr Pus No comp/EBL

## 2015-05-24 NOTE — Care Management Note (Signed)
Case Management Note  Patient Details  Name: Neil [name] MRN: [id number] Date of Birth: 1941-03-30  Subjective/Objective:    Pt admitted with choalangitis                Action/Plan: from home   Expected Discharge Date:                  Expected Discharge Plan:  Home/Self Care  In-House Referral:  NA  Discharge planning Services  CM Consult  Post Acute Care Choice:    Choice offered to:     DME Arranged:    DME Agency:     HH Arranged:    [place] Agency:     Status of Service:  In process, will continue to follow  Medicare Important Message Given:    Date Medicare IM Given:    Medicare IM give by:    Date Additional Medicare IM Given:    Additional Medicare Important Message give by:     If discussed at [place] of Stay Meetings, dates discussed:    Additional Comments:  Purcell Mouton, RN 05/24/2015, 11:02 AM

## 2015-05-24 NOTE — Progress Notes (Signed)
Initial Nutrition Assessment  DOCUMENTATION CODES:   Non-severe (moderate) malnutrition in context of acute illness/injury  INTERVENTION:   Diet advancement per MD RD to continue to monitor for needs once diet advanced  NUTRITION DIAGNOSIS:   Inadequate oral intake related to poor appetite as evidenced by meal completion < 25%, per patient/family report.  GOAL:   Patient will meet greater than or equal to 90% of their needs  MONITOR:   Diet advancement, Labs, Weight trends, Skin, I & O's  REASON FOR ASSESSMENT:   Malnutrition Screening Tool    ASSESSMENT:   74 y.o. male admitted today from the office with complaints of upper abdominal pain, fever, shaking chills and sweats over the past 72 hours and mild hypotension documented in the office with blood pressure of 90/50.  Pt in room with wife at bedside. Pt reports poor appetite, states that most days this week he has only eaten a bowl of cereal in the morning. Pt reports getting full very quickly. Pt states that recently he started eating [employer] meals in order to lower his salt intake, not to lose weight. However, he states that earlier this year he was trying to lose weight in order to lower his HgbA1c.  Pt currently NPO for a procedure. He did eat this morning (50%).  Per weight history documentation, pt has lost 17 lb since 3/11 (10% weight loss x 5 months, significant for time frame).  Nutrition-Focused physical exam completed. Findings are moderate fat depletion, mild muscle depletion, and no edema.   Labs reviewed: Elevated BUN & Creatinine  Diet Order:  Diet NPO time specified Except for: Sips with Meds  Skin:  Reviewed, no issues  Last BM:  8/8  Height:   Ht Readings from Last 1 Encounters:  05/23/15 5' 7.24" (1.708 m)    Weight:   Wt Readings from Last 1 Encounters:  05/23/15 153 lb 14.1 oz (69.8 kg)    Ideal Body Weight:  67.3 kg  BMI:  Body mass index is 23.93 kg/(m^2).  Estimated  Nutritional Needs:   Kcal:  4540-9811  Protein:  85-95g  Fluid:  1.8L/day  EDUCATION NEEDS:   No education needs identified at this time  Clayton Bibles, MS, RD, LDN Pager: [phone] After Hours Pager: [phone]

## 2015-05-24 NOTE — Consult Note (Signed)
[facility]  Total days of antibiotics 2        Day 2 piptazo               Reason for Consult: hepatitic abscess   Referring Physician: gessner  Active Problems:   Liver abscess   Normocytic anemia   Acute kidney injury   Essential hypertension, benign   Abscess, hepatic    HPI: Neil [name] is a 74 y.o. male M with HX of DM, HTN, HLD, diverticulosis, who has recent hx of choledocolithiasis s/p ERCP, sphincterotomy, biliary stent placement in mid May. He subsewently underwent lap chole at that time. He initially did well after his surgery but then developed RUQ pain and discomfort since then. He did undergo further evaluation, as well as ERCP that still found evidence of common bile duct stones/stent removal in mid July. In the 1-2 week leading up to this admission, he reports feeling unwell, worsening right upper quadrant pain as well as dfever rigors and chills with associated nausea, poor appetite. And poor oral intake. He was admitted by the GI service to see if he has recurrent cholangitis vs. Liver abscess. His labs revealed leukocytosis of 20K, thrombocytosis, mildly elevated acute on CKD with cr of 1.67 up from baseline of 1.3. AST elevated at 56. He was tachycardic in setting of fever. He was started on piptazo empirically. He did undergo mri of abdomen that did show 8 x 8 x 10 cm multiloculated thick rimmed fluid collection c/w abscess.  Past Medical History  Diagnosis Date  . ALLERGIC RHINITIS 12/04/2007  . BENIGN PROSTATIC HYPERTROPHY 06/05/2007  . BUNDLE BRANCH BLOCK, RIGHT 07/21/2009  . BURSITIS, RIGHT SHOULDER 11/06/2010  . COLONIC POLYPS, HX OF 06/05/2007    tubular adenoma also 02/19/2013  . DIVERTICULOSIS, COLON 07/21/2009  . ERECTILE DYSFUNCTION 06/05/2007  . HYPERLIPIDEMIA 06/05/2007  . HYPERTENSION 06/05/2007  . INGUINAL HERNIA, RIGHT, SMALL 11/06/2010  . SKIN LESION 06/03/2008  . DIABETES MELLITUS, TYPE II 12/04/2007    pt states not  diabetic-no meds 02-05-13 PV  . Gallstones   . Liver abscess 05/23/2015    Allergies:  Allergies  Allergen Reactions  . Ezetimibe Other (See Comments)    Muscle and bone pain  . Statins     REACTION: muscle aches      MEDICATIONS: . piperacillin-tazobactam (ZOSYN)  IV  3.375 g Intravenous Q8H    History  Substance Use Topics  . Smoking status: Former Smoker    Quit date: 10/15/2005  . Smokeless tobacco: Never Used  . Alcohol Use: 0.0 oz/week    0 Standard drinks or equivalent per week     Comment: rare    Family History  Problem Relation Age of Onset  . Colon cancer Mother 32  . Brain cancer Mother 30    mets from colon cancer  . Rectal cancer Neg Hx   . Stomach cancer Neg Hx      Review of Systems  Constitutional: positive for fever, chills, diaphoresis, activity change, appetite change, fatigue and unexpected weight change.  HENT: Negative for congestion, sore throat, rhinorrhea, sneezing, trouble swallowing and sinus pressure.  Eyes: Negative for photophobia and visual disturbance.  Respiratory: Negative for cough, chest tightness, shortness of breath, wheezing and stridor.  Cardiovascular: Negative for chest pain, palpitations and leg swelling.  Gastrointestinal: positive for nausea, but no vomiting, + abdominal pain, but neg diarrhea, constipation, blood in stool, abdominal distention and anal bleeding.  Genitourinary: Negative  for dysuria, hematuria, flank pain and difficulty urinating.  Musculoskeletal: Negative for myalgias, back pain, joint swelling, arthralgias and gait problem.  Skin: Negative for color change, pallor, rash and wound.  Neurological: Negative for dizziness, tremors, weakness and light-headedness.  Hematological: Negative for adenopathy. Does not bruise/bleed easily.  Psychiatric/Behavioral: Negative for behavioral problems, confusion, sleep disturbance, dysphoric mood, decreased concentration and agitation.     OBJECTIVE: Temp:  [97.6  F (36.4 C)-101.9 F (38.8 C)] 98 F (36.7 C) (08/09 1348) Pulse Rate:  [60-91] 60 (08/09 1348) Resp:  [16-18] 18 (08/09 1348) BP: (110-146)/(44-62) 121/46 mmHg (08/09 1348) SpO2:  [96 %-100 %] 99 % (08/09 1348) Weight:  [153 lb 14.1 oz (69.8 kg)] 153 lb 14.1 oz (69.8 kg) (08/08 1637)  He was away from room undergoing IR procedure    LABS: Results for orders placed or performed during the hospital encounter of 05/23/15 (from the past 48 hour(s))  Comprehensive metabolic panel     Status: Abnormal   Collection Time: 05/23/15 11:35 AM  Result Value Ref Range   Sodium 136 135 - 145 mmol/L   Potassium 3.7 3.5 - 5.1 mmol/L   Chloride 102 101 - 111 mmol/L   CO2 26 22 - 32 mmol/L   Glucose, Bld 145 (H) 65 - 99 mg/dL   BUN 33 (H) 6 - 20 mg/dL   Creatinine, Ser 1.67 (H) 0.61 - 1.24 mg/dL   Calcium 9.3 8.9 - 10.3 mg/dL   Total Protein 7.5 6.5 - 8.1 g/dL   Albumin 2.9 (L) 3.5 - 5.0 g/dL   AST 56 (H) 15 - 41 U/L   ALT 45 17 - 63 U/L   Alkaline Phosphatase 71 38 - 126 U/L   Total Bilirubin 0.8 0.3 - 1.2 mg/dL   GFR calc non Af Amer 39 (L) >60 mL/min   GFR calc Af Amer 45 (L) >60 mL/min    Comment: (NOTE) The eGFR has been calculated using the CKD EPI equation. This calculation has not been validated in all clinical situations. eGFR's persistently <60 mL/min signify possible Chronic Kidney Disease.    Anion gap 8 5 - 15  CBC     Status: Abnormal   Collection Time: 05/23/15 11:35 AM  Result Value Ref Range   WBC 20.0 (H) 4.0 - 10.5 K/uL   RBC 3.71 (L) 4.22 - 5.81 MIL/uL   Hemoglobin 10.2 (L) 13.0 - 17.0 g/dL   HCT 31.8 (L) 39.0 - 52.0 %   MCV 85.7 78.0 - 100.0 fL   MCH 27.5 26.0 - 34.0 pg   MCHC 32.1 30.0 - 36.0 g/dL   RDW 14.6 11.5 - 15.5 %   Platelets 545 (H) 150 - 400 K/uL  Protime-INR     Status: Abnormal   Collection Time: 05/23/15 11:35 AM  Result Value Ref Range   Prothrombin Time 15.9 (H) 11.6 - 15.2 seconds   INR 1.25 0.00 - 1.49  Lipase, blood     Status: None    Collection Time: 05/23/15 11:35 AM  Result Value Ref Range   Lipase 23 22 - 51 U/L  Urinalysis, Routine w reflex microscopic (not at [facility])     Status: Abnormal   Collection Time: 05/23/15 10:05 PM  Result Value Ref Range   Color, Urine AMBER (A) YELLOW    Comment: BIOCHEMICALS MAY BE AFFECTED BY COLOR   APPearance CLOUDY (A) CLEAR   Specific Gravity, Urine 1.027 1.005 - 1.030   pH 5.5 5.0 - 8.0   Glucose,  UA NEGATIVE NEGATIVE mg/dL   Hgb urine dipstick NEGATIVE NEGATIVE   Bilirubin Urine SMALL (A) NEGATIVE   Ketones, ur NEGATIVE NEGATIVE mg/dL   Protein, ur 30 (A) NEGATIVE mg/dL   Urobilinogen, UA 1.0 0.0 - 1.0 mg/dL   Nitrite NEGATIVE NEGATIVE   Leukocytes, UA NEGATIVE NEGATIVE  Urine microscopic-add on     Status: Abnormal   Collection Time: 05/23/15 10:05 PM  Result Value Ref Range   Bacteria, UA RARE RARE   Casts HYALINE CASTS (A) NEGATIVE   Urine-Other MUCOUS PRESENT   CBC     Status: Abnormal   Collection Time: 05/24/15  6:03 AM  Result Value Ref Range   WBC 19.6 (H) 4.0 - 10.5 K/uL   RBC 3.69 (L) 4.22 - 5.81 MIL/uL   Hemoglobin 10.1 (L) 13.0 - 17.0 g/dL   HCT 31.1 (L) 39.0 - 52.0 %   MCV 84.3 78.0 - 100.0 fL   MCH 27.4 26.0 - 34.0 pg   MCHC 32.5 30.0 - 36.0 g/dL   RDW 14.7 11.5 - 15.5 %   Platelets 414 (H) 150 - 400 K/uL  Comprehensive metabolic panel     Status: Abnormal   Collection Time: 05/24/15  6:03 AM  Result Value Ref Range   Sodium 137 135 - 145 mmol/L   Potassium 3.7 3.5 - 5.1 mmol/L   Chloride 106 101 - 111 mmol/L   CO2 23 22 - 32 mmol/L   Glucose, Bld 141 (H) 65 - 99 mg/dL   BUN 27 (H) 6 - 20 mg/dL   Creatinine, Ser 1.33 (H) 0.61 - 1.24 mg/dL   Calcium 8.8 (L) 8.9 - 10.3 mg/dL   Total Protein 6.7 6.5 - 8.1 g/dL   Albumin 2.6 (L) 3.5 - 5.0 g/dL   AST 50 (H) 15 - 41 U/L   ALT 40 17 - 63 U/L   Alkaline Phosphatase 68 38 - 126 U/L   Total Bilirubin 0.9 0.3 - 1.2 mg/dL   GFR calc non Af Amer 51 (L) >60 mL/min   GFR calc Af Amer 60 (L) >60 mL/min     Comment: (NOTE) The eGFR has been calculated using the CKD EPI equation. This calculation has not been validated in all clinical situations. eGFR's persistently <60 mL/min signify possible Chronic Kidney Disease.    Anion gap 8 5 - 15    MICRO: 8/8 blood cx NGTD IMAGING: Mr 3d Recon At Scanner  05/24/2015   CLINICAL DATA:  74 year old male inpatient with a history of cholecystectomy on 03/02/15 requiring biliary stent placement and subsequent removal on 05/03/15, presents with fever, shaking chills, sweats, weakness and upper abdominal pain for 2 weeks.  EXAM: MRI ABDOMEN WITHOUT AND WITH CONTRAST (INCLUDING MRCP)  TECHNIQUE: Multiplanar multisequence MR imaging of the abdomen was performed both before and after the administration of intravenous contrast. Heavily T2-weighted images of the biliary and pancreatic ducts were obtained, and three-dimensional MRCP images were rendered by post processing.  CONTRAST:  64m MULTIHANCE GADOBENATE DIMEGLUMINE 529 MG/ML IV SOLN  COMPARISON:  02/11/2015 CT abdomen/pelvis. 04/08/2015 abdominal sonogram.  FINDINGS: Lower chest:  There is mild bibasilar atelectasis.  Hepatobiliary: There is a complex multilocular fluid collection centered within segment 4B of the liver measuring 8.1 x 7.9 x 10.2 cm, which is new since the 04/08/15 ultrasound and demonstrates a thick enhancing wall, thick enhancing internal septations and internal restricted diffusion, in keeping with a liver abscess. No additional liver fluid collections or masses. There is no convincing evidence of  background hepatic steatosis on chemical shift imaging. The gallbladder is surgically absent. There is mild diffuse intrahepatic biliary ductal dilatation, similar to the 02/11/2015 CT study. The common bile duct is mildly to moderately dilated, measuring 9 mm in diameter. There are a few scattered low signal defects in the nondependent portion of the common bile duct and within the nondependent intrahepatic  biliary tree in the left liver lobe, likely representing pneumobilia. There is enhancement of the wall of the common bile duct, suggesting cholangitis. There is smooth narrowing of the common bile duct at the porta hepatis, likely due to mass effect from the adjacent liver abscess. No round filling defects are seen within the biliary tree to suggest choledocholithiasis.  Pancreas: Normal pancreas, with no pancreatic mass, no main pancreatic duct dilation and no pancreatic or peripancreatic fluid collections. No definite evidence of pancreas divisum, although assessment for divisum is limited by motion artifact.  Spleen: Normal.  Adrenals/Urinary Tract: Normal adrenals. Normal kidneys, with no renal mass and no hydronephrosis.  Stomach/Bowel: Collapsed and grossly normal stomach. Visualized small and large bowel are normal caliber.  Vascular/Lymphatic: There is a stable penetrating atherosclerotic ulcer his measuring 1.5 x 1.1 cm in the right posterior infrarenal abdominal aortic wall (series 1105/ image 74). Patent portal, splenic, hepatic and renal veins. Retroaortic left renal vein. No abdominal lymphadenopathy.  Other: No ascites.  Musculoskeletal: No gross suspicious focal osseous lesion.  IMPRESSION: 1. New 10.2 cm multilocular liver abscess centered in segment 4B. 2. Enhancing common bile duct wall, suggesting cholangitis. 3. Pneumobilia, likely due to a history of sphincterotomy. 4. Smooth narrowing of the common bile duct at the porta hepatis, likely due to mass effect from the adjacent liver abscess. No appreciable choledocholithiasis. 5. 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  These results were called by telephone at the time of interpretation on 05/24/2015 at 8:16 am to Dr. Nicoletta Ba , who verbally acknowledged these results.   Electronically Signed   By: Ilona Sorrel M.D.   On: 05/24/2015 08:28   Mr [name] W/wo Cm/mrcp  05/24/2015   CLINICAL DATA:   74 year old male inpatient with a history of cholecystectomy on 03/02/15 requiring biliary stent placement and subsequent removal on 05/03/15, presents with fever, shaking chills, sweats, weakness and upper abdominal pain for 2 weeks.  EXAM: MRI ABDOMEN WITHOUT AND WITH CONTRAST (INCLUDING MRCP)  TECHNIQUE: Multiplanar multisequence MR imaging of the abdomen was performed both before and after the administration of intravenous contrast. Heavily T2-weighted images of the biliary and pancreatic ducts were obtained, and three-dimensional MRCP images were rendered by post processing.  CONTRAST:  20m MULTIHANCE GADOBENATE DIMEGLUMINE 529 MG/ML IV SOLN  COMPARISON:  02/11/2015 CT abdomen/pelvis. 04/08/2015 abdominal sonogram.  FINDINGS: Lower chest:  There is mild bibasilar atelectasis.  Hepatobiliary: There is a complex multilocular fluid collection centered within segment 4B of the liver measuring 8.1 x 7.9 x 10.2 cm, which is new since the 04/08/15 ultrasound and demonstrates a thick enhancing wall, thick enhancing internal septations and internal restricted diffusion, in keeping with a liver abscess. No additional liver fluid collections or masses. There is no convincing evidence of background hepatic steatosis on chemical shift imaging. The gallbladder is surgically absent. There is mild diffuse intrahepatic biliary ductal dilatation, similar to the 02/11/2015 CT study. The common bile duct is mildly to moderately dilated, measuring 9 mm in diameter. There are a few scattered low signal defects in the nondependent portion of the common bile duct and  within the nondependent intrahepatic biliary tree in the left liver lobe, likely representing pneumobilia. There is enhancement of the wall of the common bile duct, suggesting cholangitis. There is smooth narrowing of the common bile duct at the porta hepatis, likely due to mass effect from the adjacent liver abscess. No round filling defects are seen within the biliary  tree to suggest choledocholithiasis.  Pancreas: Normal pancreas, with no pancreatic mass, no main pancreatic duct dilation and no pancreatic or peripancreatic fluid collections. No definite evidence of pancreas divisum, although assessment for divisum is limited by motion artifact.  Spleen: Normal.  Adrenals/Urinary Tract: Normal adrenals. Normal kidneys, with no renal mass and no hydronephrosis.  Stomach/Bowel: Collapsed and grossly normal stomach. Visualized small and large bowel are normal caliber.  Vascular/Lymphatic: There is a stable penetrating atherosclerotic ulcer his measuring 1.5 x 1.1 cm in the right posterior infrarenal abdominal aortic wall (series 1105/ image 74). Patent portal, splenic, hepatic and renal veins. Retroaortic left renal vein. No abdominal lymphadenopathy.  Other: No ascites.  Musculoskeletal: No gross suspicious focal osseous lesion.  IMPRESSION: 1. New 10.2 cm multilocular liver abscess centered in segment 4B. 2. Enhancing common bile duct wall, suggesting cholangitis. 3. Pneumobilia, likely due to a history of sphincterotomy. 4. Smooth narrowing of the common bile duct at the porta hepatis, likely due to mass effect from the adjacent liver abscess. No appreciable choledocholithiasis. 5. 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  These results were called by telephone at the time of interpretation on 05/24/2015 at 8:16 am to Dr. Nicoletta Ba , who verbally acknowledged these results.   Electronically Signed   By: Ilona Sorrel M.D.   On: 05/24/2015 08:28    Assessment/Plan:  74yo M with history of choledocholithiasis s/p stent placement-removal, s/p lap chole now presents with fevers, RUQ, leukocytosis and imaging consistent with liver abscess  - continue with piptazo - please have IR  send specimen off for culture despite being on abtx. May still help with identifying how to narrow abtx - will likely need picc line to be placed once blood  cx are NGTD at 48hrs. For long term antibiotics - recommend 3-4 wk of IV antibiotics.

## 2015-05-24 NOTE — H&P (Signed)
Chief Complaint: Patient was seen in consultation today for GB fossa/hepatic fluid collection at the request of Dr. Carlean Purl  Referring Physician(s): Dr. Carlean Purl  History of Present Illness: Neil [name] is a 74 y.o. male with history of chronic cholecystitis with cholelithiasis who is s/p 03/01/15 ERCP, sphincterotomy, stone removal and biliary stent placed-since removed. He is s/p lap cholecystectomy on 03/02/15. He presents with progressive RUQ abdominal pain, fever, nausea, and fatigue worse this last weekend. MR revealed GB fossa/hepatic fluid collection concerning for abscess. Labs reveal leukocytosis and IR received request for drain placement. He denies any chest pain, shortness of breath or palpitations. He denies any active signs of bleeding or excessive bruising. The patient denies any history of sleep apnea or chronic oxygen use. He has previously tolerated sedation without complications.    Past Medical History  Diagnosis Date  . ALLERGIC RHINITIS 12/04/2007  . BENIGN PROSTATIC HYPERTROPHY 06/05/2007  . BUNDLE BRANCH BLOCK, RIGHT 07/21/2009  . BURSITIS, RIGHT SHOULDER 11/06/2010  . COLONIC POLYPS, HX OF 06/05/2007    tubular adenoma also 02/19/2013  . DIVERTICULOSIS, COLON 07/21/2009  . ERECTILE DYSFUNCTION 06/05/2007  . HYPERLIPIDEMIA 06/05/2007  . HYPERTENSION 06/05/2007  . INGUINAL HERNIA, RIGHT, SMALL 11/06/2010  . SKIN LESION 06/03/2008  . DIABETES MELLITUS, TYPE II 12/04/2007    pt states not diabetic-no meds 02-05-13 PV  . Gallstones   . Liver abscess 05/23/2015    Past Surgical History  Procedure Laterality Date  . Colonoscopy    . Polypectomy    . Hemorrhoid surgery    . Finger surgery Left   . Tonsillectomy    . Inguinal hernia repair Right     8'14 repair  . Ercp N/A 03/01/2015    Procedure: ENDOSCOPIC RETROGRADE CHOLANGIOPANCREATOGRAPHY (ERCP);  Surgeon: Irene Shipper, MD;  Location: [facility];  Service: Endoscopy;  Laterality: N/A;  Dr Dalbert Batman wants patient  to stay overnight after ERCP  . Cholecystectomy N/A 03/02/2015    Procedure: LAPAROSCOPIC CHOLECYSTECTOMY;  Surgeon: Fanny Skates, MD;  Location: [facility];  Service: General;  Laterality: N/A;  . Ercp N/A 05/03/2015    Procedure: ENDOSCOPIC RETROGRADE CHOLANGIOPANCREATOGRAPHY (ERCP);  Surgeon: Irene Shipper, MD;  Location: [facility];  Service: Endoscopy;  Laterality: N/A;    Allergies: Ezetimibe and Statins  Medications: Prior to Admission medications   Medication Sig Start Date End Date Taking? Authorizing Provider  acetaminophen (TYLENOL) 500 MG tablet Take 1,500 mg by mouth 2 (two) times daily.    Yes Historical Provider, MD  benazepril (LOTENSIN) 40 MG tablet Take 1 tablet (40 mg total) by mouth daily. 03/10/15  Yes Biagio Borg, MD  fenofibrate 160 MG tablet Take 1 tablet (160 mg total) by mouth at bedtime. 03/10/15  Yes Biagio Borg, MD  sodium chloride (OCEAN) 0.65 % nasal spray Place 1 spray into the nose daily as needed for congestion.    Yes Historical Provider, MD  terazosin (HYTRIN) 2 MG capsule Take 1 capsule (2 mg total) by mouth once. Patient taking differently: Take 2 mg by mouth daily.  03/10/15  Yes Biagio Borg, MD     Family History  Problem Relation Age of Onset  . Colon cancer Mother 27  . Brain cancer Mother 58    mets from colon cancer  . Rectal cancer Neg Hx   . Stomach cancer Neg Hx     History   Social History  . Marital Status: Married    Spouse Name: N/A  .  Number of Children: N/A  . Years of Education: N/A   Occupational History  . retired    Social History Main Topics  . Smoking status: Former Smoker    Quit date: 10/15/2005  . Smokeless tobacco: Never Used  . Alcohol Use: 0.0 oz/week    0 Standard drinks or equivalent per week     Comment: rare  . Drug Use: No  . Sexual Activity: Not on file   Other Topics Concern  . None   Social History Narrative    Review of Systems: A 12 point ROS discussed and pertinent positives are indicated  in the HPI above.  All other systems are negative.  Review of Systems  Vital Signs: BP 138/57 mmHg  Pulse 78  Temp(Src) 98.8 F (37.1 C) (Oral)  Resp 18  Ht 5' 7.24" (1.708 m)  Wt 153 lb 14.1 oz (69.8 kg)  BMI 23.93 kg/m2  SpO2 96%  Physical Exam  Constitutional: He is oriented to person, place, and time. No distress.  HENT:  Head: Normocephalic and atraumatic.  Cardiovascular: Normal rate and regular rhythm.  Exam reveals no gallop and no friction rub.   No murmur heard. Pulmonary/Chest: Effort normal and breath sounds normal. No respiratory distress. He has no wheezes. He has no rales.  Abdominal: Soft. Bowel sounds are normal. There is tenderness.  RUQ TTP  Neurological: He is alert and oriented to person, place, and time.  Skin: He is not diaphoretic.    Mallampati Score:  MD Evaluation Airway: WNL Heart: WNL Abdomen: WNL Chest/ Lungs: WNL ASA  Classification: 3 Mallampati/Airway Score: Two  Imaging: Mr 3d Recon At Scanner  05/24/2015   CLINICAL DATA:  74 year old male inpatient with a history of cholecystectomy on 03/02/15 requiring biliary stent placement and subsequent removal on 05/03/15, presents with fever, shaking chills, sweats, weakness and upper abdominal pain for 2 weeks.  EXAM: MRI ABDOMEN WITHOUT AND WITH CONTRAST (INCLUDING MRCP)  TECHNIQUE: Multiplanar multisequence MR imaging of the abdomen was performed both before and after the administration of intravenous contrast. Heavily T2-weighted images of the biliary and pancreatic ducts were obtained, and three-dimensional MRCP images were rendered by post processing.  CONTRAST:  33mL MULTIHANCE GADOBENATE DIMEGLUMINE 529 MG/ML IV SOLN  COMPARISON:  02/11/2015 CT abdomen/pelvis. 04/08/2015 abdominal sonogram.  FINDINGS: Lower chest:  There is mild bibasilar atelectasis.  Hepatobiliary: There is a complex multilocular fluid collection centered within segment 4B of the liver measuring 8.1 x 7.9 x 10.2 cm, which is new  since the 04/08/15 ultrasound and demonstrates a thick enhancing wall, thick enhancing internal septations and internal restricted diffusion, in keeping with a liver abscess. No additional liver fluid collections or masses. There is no convincing evidence of background hepatic steatosis on chemical shift imaging. The gallbladder is surgically absent. There is mild diffuse intrahepatic biliary ductal dilatation, similar to the 02/11/2015 CT study. The common bile duct is mildly to moderately dilated, measuring 9 mm in diameter. There are a few scattered low signal defects in the nondependent portion of the common bile duct and within the nondependent intrahepatic biliary tree in the left liver lobe, likely representing pneumobilia. There is enhancement of the wall of the common bile duct, suggesting cholangitis. There is smooth narrowing of the common bile duct at the porta hepatis, likely due to mass effect from the adjacent liver abscess. No round filling defects are seen within the biliary tree to suggest choledocholithiasis.  Pancreas: Normal pancreas, with no pancreatic mass, no main pancreatic duct  dilation and no pancreatic or peripancreatic fluid collections. No definite evidence of pancreas divisum, although assessment for divisum is limited by motion artifact.  Spleen: Normal.  Adrenals/Urinary Tract: Normal adrenals. Normal kidneys, with no renal mass and no hydronephrosis.  Stomach/Bowel: Collapsed and grossly normal stomach. Visualized small and large bowel are normal caliber.  Vascular/Lymphatic: There is a stable penetrating atherosclerotic ulcer his measuring 1.5 x 1.1 cm in the right posterior infrarenal abdominal aortic wall (series 1105/ image 74). Patent portal, splenic, hepatic and renal veins. Retroaortic left renal vein. No abdominal lymphadenopathy.  Other: No ascites.  Musculoskeletal: No gross suspicious focal osseous lesion.  IMPRESSION: 1. New 10.2 cm multilocular liver abscess centered in  segment 4B. 2. Enhancing common bile duct wall, suggesting cholangitis. 3. Pneumobilia, likely due to a history of sphincterotomy. 4. Smooth narrowing of the common bile duct at the porta hepatis, likely due to mass effect from the adjacent liver abscess. No appreciable choledocholithiasis. 5. 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  These results were called by telephone at the time of interpretation on 05/24/2015 at 8:16 am to Dr. Nicoletta Ba , who verbally acknowledged these results.   Electronically Signed   By: Ilona Sorrel M.D.   On: 05/24/2015 08:28   Dg Ercp  05/03/2015   CLINICAL DATA:  Biliary stent removal.  EXAM: ERCP .  TECHNIQUE: Multiple spot images obtained with the fluoroscopic device and submitted for interpretation post-procedure.  FLUOROSCOPY TIME:  Fluoroscopy Time:  6 minutes 42 seconds.  Number of Acquired Images:  21.  COMPARISON:  Ultrasound of April 08, 2015.  FINDINGS: Multiple fluoroscopic images were obtained during ERCP. Initial image demonstrates biliary stent in place. Later images demonstrate this to have been removed. Cannulation of common bile duct is noted with contrast injected. This demonstrates multiple round filling defects consistent with stones in the dilated common bile duct. Balloon catheter was passed through the duct. No residual stones are noted on follow-up imaging.  IMPRESSION: Biliary stent was removed. Balloon catheter was passed through common bile duct to remove multiple common bile duct stones.  These images were submitted for radiologic interpretation only. Please see the procedural report for the amount of contrast and the fluoroscopy time utilized.   Electronically Signed   By: Marijo Conception, M.D.   On: 05/03/2015 12:19   Mr [name] W/wo Cm/mrcp  05/24/2015   CLINICAL DATA:  74 year old male inpatient with a history of cholecystectomy on 03/02/15 requiring biliary stent placement and subsequent removal on 05/03/15,  presents with fever, shaking chills, sweats, weakness and upper abdominal pain for 2 weeks.  EXAM: MRI ABDOMEN WITHOUT AND WITH CONTRAST (INCLUDING MRCP)  TECHNIQUE: Multiplanar multisequence MR imaging of the abdomen was performed both before and after the administration of intravenous contrast. Heavily T2-weighted images of the biliary and pancreatic ducts were obtained, and three-dimensional MRCP images were rendered by post processing.  CONTRAST:  44mL MULTIHANCE GADOBENATE DIMEGLUMINE 529 MG/ML IV SOLN  COMPARISON:  02/11/2015 CT abdomen/pelvis. 04/08/2015 abdominal sonogram.  FINDINGS: Lower chest:  There is mild bibasilar atelectasis.  Hepatobiliary: There is a complex multilocular fluid collection centered within segment 4B of the liver measuring 8.1 x 7.9 x 10.2 cm, which is new since the 04/08/15 ultrasound and demonstrates a thick enhancing wall, thick enhancing internal septations and internal restricted diffusion, in keeping with a liver abscess. No additional liver fluid collections or masses. There is no convincing evidence of background hepatic steatosis on chemical shift  imaging. The gallbladder is surgically absent. There is mild diffuse intrahepatic biliary ductal dilatation, similar to the 02/11/2015 CT study. The common bile duct is mildly to moderately dilated, measuring 9 mm in diameter. There are a few scattered low signal defects in the nondependent portion of the common bile duct and within the nondependent intrahepatic biliary tree in the left liver lobe, likely representing pneumobilia. There is enhancement of the wall of the common bile duct, suggesting cholangitis. There is smooth narrowing of the common bile duct at the porta hepatis, likely due to mass effect from the adjacent liver abscess. No round filling defects are seen within the biliary tree to suggest choledocholithiasis.  Pancreas: Normal pancreas, with no pancreatic mass, no main pancreatic duct dilation and no pancreatic or  peripancreatic fluid collections. No definite evidence of pancreas divisum, although assessment for divisum is limited by motion artifact.  Spleen: Normal.  Adrenals/Urinary Tract: Normal adrenals. Normal kidneys, with no renal mass and no hydronephrosis.  Stomach/Bowel: Collapsed and grossly normal stomach. Visualized small and large bowel are normal caliber.  Vascular/Lymphatic: There is a stable penetrating atherosclerotic ulcer his measuring 1.5 x 1.1 cm in the right posterior infrarenal abdominal aortic wall (series 1105/ image 74). Patent portal, splenic, hepatic and renal veins. Retroaortic left renal vein. No abdominal lymphadenopathy.  Other: No ascites.  Musculoskeletal: No gross suspicious focal osseous lesion.  IMPRESSION: 1. New 10.2 cm multilocular liver abscess centered in segment 4B. 2. Enhancing common bile duct wall, suggesting cholangitis. 3. Pneumobilia, likely due to a history of sphincterotomy. 4. Smooth narrowing of the common bile duct at the porta hepatis, likely due to mass effect from the adjacent liver abscess. No appreciable choledocholithiasis. 5. 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  These results were called by telephone at the time of interpretation on 05/24/2015 at 8:16 am to Dr. Nicoletta Ba , who verbally acknowledged these results.   Electronically Signed   By: Ilona Sorrel M.D.   On: 05/24/2015 08:28    Labs:  CBC:  Recent Labs  03/02/15 2056 04/01/15 1443 05/23/15 1135 05/24/15 0603  WBC 12.9* 9.0 20.0* 19.6*  HGB 11.6* 12.3* 10.2* 10.1*  HCT 36.5* 37.8* 31.8* 31.1*  PLT 176 361.0 545* 414*    COAGS:  Recent Labs  02/15/15 0731 05/23/15 1135  INR 0.9 1.25    BMP:  Recent Labs  12/16/14 0729 03/02/15 0528 03/02/15 2056 05/23/15 1135 05/24/15 0603  NA 141 140  --  136 137  K 4.4 4.3  --  3.7 3.7  CL 105 108  --  102 106  CO2 27 25  --  26 23  GLUCOSE 138* 133*  --  145* 141*  BUN 20 16  --  33*  27*  CALCIUM 9.7 8.4*  --  9.3 8.8*  CREATININE 1.37 1.48* 1.31* 1.67* 1.33*  GFRNONAA  --  46* 53* 39* 51*  GFRAA  --  53* >60 45* 60*    LIVER FUNCTION TESTS:  Recent Labs  03/02/15 0528 04/01/15 1443 05/23/15 1135 05/24/15 0603  BILITOT 0.7 0.4 0.8 0.9  AST 28 14 56* 50*  ALT 23 9 45 40  ALKPHOS 36* 44 71 68  PROT 5.3* 7.5 7.5 6.7  ALBUMIN 3.0* 4.1 2.9* 2.6*    Assessment and Plan: Chronic cholecystitis with cholelithiasis  S/p 03/01/15 ERCP, sphincterotomy, stone removal and biliary stent placed-since removed S/p lap cholecystectomy 03/02/15 Progressive RUQ abdominal pain, fever, nausea, and fatigue MR  revealed GB fossa/hepatic fluid collection concerning for abscess Leukocytosis and fevers on IV Zosyn Request for IR drain placement The patient has been NPO, no blood thinners taken, labs and vitals have been reviewed.. Risks and Benefits discussed with the patient including bleeding, infection, damage to adjacent structures and sepsis. All of the patient's questions were answered, patient is agreeable to proceed. Consent signed and in chart.    Thank you for this interesting consult.  I greatly enjoyed meeting LACHLAN MCKIM and look forward to participating in their care.  A copy of this report was sent to the requesting provider on this date.  SignedHedy Jacob 05/24/2015, 1:14 PM   I spent a total of 40 Minutes in face to face in clinical consultation, greater than 50% of which was counseling/coordinating care for GB fossa/hepatic fluid collection.

## 2015-05-25 DIAGNOSIS — R61 Generalized hyperhidrosis: Secondary | ICD-10-CM

## 2015-05-25 NOTE — Progress Notes (Addendum)
    Progress Note   Subjective  appetite is back, eating well. A couple of episodes of night sweats last night   Objective   Vital signs in last 24 hours: Temp:  [98 F (36.7 C)-99.7 F (37.6 C)] 98 F (36.7 C) (08/10 0535) Pulse Rate:  [59-85] 59 (08/10 0535) Resp:  [13-19] 18 (08/10 0535) BP: (103-139)/(40-67) 139/67 mmHg (08/10 0535) SpO2:  [97 %-100 %] 99 % (08/10 0535) Last BM Date: 05/23/15 General:    Pleasant white male in NAD Heart:  Regular rate and rhythm Lungs: Respirations even and unlabored, lungs CTA bilaterally Abdomen:  Soft, nondistended, significant RUQ tenderness. Right perc drain with scant blood tinged drainage.  Normal bowel sounds. Extremities:  Without edema. Neurologic:  Alert and oriented,  grossly normal neurologically. Psych:  Cooperative. Normal mood and affect.  Intake/Output from previous day: 08/09 0701 - 08/10 0700 In: 920 [P.O.:720; IV Piggyback:100] Out: 380 [Urine:300; Drains:80]     Lab Results:  Recent Labs  05/23/15 1135 05/24/15 0603  WBC 20.0* 19.6*  HGB 10.2* 10.1*  HCT 31.8* 31.1*  PLT 545* 414*   BMET  Recent Labs  05/23/15 1135 05/24/15 0603  NA 136 137  K 3.7 3.7  CL 102 106  CO2 26 23  GLUCOSE 145* 141*  BUN 33* 27*  CREATININE 1.67* 1.33*  CALCIUM 9.3 8.8*      Assessment / Plan:    74 year old male with liver abscess, s/p drain placement to GB fossa yesterday by IR. Culture pending. Gram stain reveals abundant WBCs, moderate GPC in pairs and clusters. On Zosyn. Appreciate ID and IR assistance. He is feeling better. Tmax 99.7. His appetite is great which is major improvement for him. Will probably need PICC for prolonged antibiotics upon discharge. Will decrease IVF rate since taking adequate PO now.  Will recheck WBC and renal function in am.   2. Acute renal insuff, improved.   3. Normocytic anemia without signs of bleeding.   4. Hypertension - off home meds - benazapril probably related to AKI.  Watch BP and restart soon.  LOS: 2 days   Tye Savoy  05/25/2015, 10:03 AM   [facility] Attending  I have also seen and assessed the patient and agree with the advanced practitioner's assessment and plan. Hx same.  He is better - GPC in Cx staain Encourage ambulation  Cancel PICC order and do tomorrow - do not want in until BCx neg x 48 hrs Recheck BMET tomorrow  Gatha Mayer, MD, [facility] [phone] (pager) 05/25/2015 3:44 PM

## 2015-05-25 NOTE — Progress Notes (Signed)
[facility]    Date of Admission:  05/23/2015   Total days of antibiotics 3        Day 3 piptazo           ID: Neil [name] is a 74 y.o. male with liver abscess Active Problems:   Liver abscess   Normocytic anemia   Acute kidney injury   Essential hypertension, benign   Abscess, hepatic   Malnutrition of moderate degree    Subjective: Afebrile but still has occ episodes of diaphoresis. Had drained placed by ir yesterday. He is noticing that he has improved appetite  Medications:  . piperacillin-tazobactam (ZOSYN)  IV  3.375 g Intravenous Q8H    Objective: Vital signs in last 24 hours: Temp:  [98 F (36.7 C)-99.7 F (37.6 C)] 98.5 F (36.9 C) (08/10 1400) Pulse Rate:  [59-85] 65 (08/10 1400) Resp:  [13-18] 18 (08/10 1400) BP: (103-139)/(40-67) 132/61 mmHg (08/10 1400) SpO2:  [95 %-100 %] 95 % (08/10 1400) Physical Exam  Constitutional: He is oriented to person, place, and time. He appears well-developed and well-nourished. No distress.  HENT:  Mouth/Throat: Oropharynx is clear and moist. No oropharyngeal exudate.  Cardiovascular: Normal rate, regular rhythm and normal heart sounds. Exam reveals no gallop and no friction rub.  No murmur heard.  Pulmonary/Chest: Effort normal and breath sounds normal. No respiratory distress. He has no wheezes.  Abdominal: Soft. Bowel sounds are normal. He exhibits no distension. There is no tenderness. RUQ drain with serosanguinous fluid in bulb Skin: Skin is warm and dry. No rash noted. No erythema.  Psychiatric: He has a normal mood and affect. His behavior is normal.     Lab Results  Recent Labs  05/23/15 1135 05/24/15 0603  WBC 20.0* 19.6*  HGB 10.2* 10.1*  HCT 31.8* 31.1*  NA 136 137  K 3.7 3.7  CL 102 106  CO2 26 23  BUN 33* 27*  CREATININE 1.67* 1.33*   Liver Panel  Recent Labs  05/23/15 1135 05/24/15 0603  PROT 7.5 6.7  ALBUMIN 2.9* 2.6*  AST 56* 50*  ALT 45 40  ALKPHOS 71  68  BILITOT 0.8 0.9    Microbiology: 8/8 blood cx ngtd 8/9 aspirate cx gpc in prs Studies/Results: Mr 3d Recon At Scanner  05/24/2015   CLINICAL DATA:  74 year old male inpatient with a history of cholecystectomy on 03/02/15 requiring biliary stent placement and subsequent removal on 05/03/15, presents with fever, shaking chills, sweats, weakness and upper abdominal pain for 2 weeks.  EXAM: MRI ABDOMEN WITHOUT AND WITH CONTRAST (INCLUDING MRCP)  TECHNIQUE: Multiplanar multisequence MR imaging of the abdomen was performed both before and after the administration of intravenous contrast. Heavily T2-weighted images of the biliary and pancreatic ducts were obtained, and three-dimensional MRCP images were rendered by post processing.  CONTRAST:  29mL MULTIHANCE GADOBENATE DIMEGLUMINE 529 MG/ML IV SOLN  COMPARISON:  02/11/2015 CT abdomen/pelvis. 04/08/2015 abdominal sonogram.  FINDINGS: Lower chest:  There is mild bibasilar atelectasis.  Hepatobiliary: There is a complex multilocular fluid collection centered within segment 4B of the liver measuring 8.1 x 7.9 x 10.2 cm, which is new since the 04/08/15 ultrasound and demonstrates a thick enhancing wall, thick enhancing internal septations and internal restricted diffusion, in keeping with a liver abscess. No additional liver fluid collections or masses. There is no convincing evidence of background hepatic steatosis on chemical shift imaging. The gallbladder is surgically absent. There is mild diffuse intrahepatic biliary ductal dilatation,  similar to the 02/11/2015 CT study. The common bile duct is mildly to moderately dilated, measuring 9 mm in diameter. There are a few scattered low signal defects in the nondependent portion of the common bile duct and within the nondependent intrahepatic biliary tree in the left liver lobe, likely representing pneumobilia. There is enhancement of the wall of the common bile duct, suggesting cholangitis. There is smooth narrowing  of the common bile duct at the porta hepatis, likely due to mass effect from the adjacent liver abscess. No round filling defects are seen within the biliary tree to suggest choledocholithiasis.  Pancreas: Normal pancreas, with no pancreatic mass, no main pancreatic duct dilation and no pancreatic or peripancreatic fluid collections. No definite evidence of pancreas divisum, although assessment for divisum is limited by motion artifact.  Spleen: Normal.  Adrenals/Urinary Tract: Normal adrenals. Normal kidneys, with no renal mass and no hydronephrosis.  Stomach/Bowel: Collapsed and grossly normal stomach. Visualized small and large bowel are normal caliber.  Vascular/Lymphatic: There is a stable penetrating atherosclerotic ulcer his measuring 1.5 x 1.1 cm in the right posterior infrarenal abdominal aortic wall (series 1105/ image 74). Patent portal, splenic, hepatic and renal veins. Retroaortic left renal vein. No abdominal lymphadenopathy.  Other: No ascites.  Musculoskeletal: No gross suspicious focal osseous lesion.  IMPRESSION: 1. New 10.2 cm multilocular liver abscess centered in segment 4B. 2. Enhancing common bile duct wall, suggesting cholangitis. 3. Pneumobilia, likely due to a history of sphincterotomy. 4. Smooth narrowing of the common bile duct at the porta hepatis, likely due to mass effect from the adjacent liver abscess. No appreciable choledocholithiasis. 5. 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  These results were called by telephone at the time of interpretation on 05/24/2015 at 8:16 am to Dr. Nicoletta Ba , who verbally acknowledged these results.   Electronically Signed   By: Ilona Sorrel M.D.   On: 05/24/2015 08:28   Mr [name] W/wo Cm/mrcp  05/24/2015   CLINICAL DATA:  74 year old male inpatient with a history of cholecystectomy on 03/02/15 requiring biliary stent placement and subsequent removal on 05/03/15, presents with fever, shaking chills, sweats,  weakness and upper abdominal pain for 2 weeks.  EXAM: MRI ABDOMEN WITHOUT AND WITH CONTRAST (INCLUDING MRCP)  TECHNIQUE: Multiplanar multisequence MR imaging of the abdomen was performed both before and after the administration of intravenous contrast. Heavily T2-weighted images of the biliary and pancreatic ducts were obtained, and three-dimensional MRCP images were rendered by post processing.  CONTRAST:  47mL MULTIHANCE GADOBENATE DIMEGLUMINE 529 MG/ML IV SOLN  COMPARISON:  02/11/2015 CT abdomen/pelvis. 04/08/2015 abdominal sonogram.  FINDINGS: Lower chest:  There is mild bibasilar atelectasis.  Hepatobiliary: There is a complex multilocular fluid collection centered within segment 4B of the liver measuring 8.1 x 7.9 x 10.2 cm, which is new since the 04/08/15 ultrasound and demonstrates a thick enhancing wall, thick enhancing internal septations and internal restricted diffusion, in keeping with a liver abscess. No additional liver fluid collections or masses. There is no convincing evidence of background hepatic steatosis on chemical shift imaging. The gallbladder is surgically absent. There is mild diffuse intrahepatic biliary ductal dilatation, similar to the 02/11/2015 CT study. The common bile duct is mildly to moderately dilated, measuring 9 mm in diameter. There are a few scattered low signal defects in the nondependent portion of the common bile duct and within the nondependent intrahepatic biliary tree in the left liver lobe, likely representing pneumobilia. There is enhancement of the wall  of the common bile duct, suggesting cholangitis. There is smooth narrowing of the common bile duct at the porta hepatis, likely due to mass effect from the adjacent liver abscess. No round filling defects are seen within the biliary tree to suggest choledocholithiasis.  Pancreas: Normal pancreas, with no pancreatic mass, no main pancreatic duct dilation and no pancreatic or peripancreatic fluid collections. No definite  evidence of pancreas divisum, although assessment for divisum is limited by motion artifact.  Spleen: Normal.  Adrenals/Urinary Tract: Normal adrenals. Normal kidneys, with no renal mass and no hydronephrosis.  Stomach/Bowel: Collapsed and grossly normal stomach. Visualized small and large bowel are normal caliber.  Vascular/Lymphatic: There is a stable penetrating atherosclerotic ulcer his measuring 1.5 x 1.1 cm in the right posterior infrarenal abdominal aortic wall (series 1105/ image 74). Patent portal, splenic, hepatic and renal veins. Retroaortic left renal vein. No abdominal lymphadenopathy.  Other: No ascites.  Musculoskeletal: No gross suspicious focal osseous lesion.  IMPRESSION: 1. New 10.2 cm multilocular liver abscess centered in segment 4B. 2. Enhancing common bile duct wall, suggesting cholangitis. 3. Pneumobilia, likely due to a history of sphincterotomy. 4. Smooth narrowing of the common bile duct at the porta hepatis, likely due to mass effect from the adjacent liver abscess. No appreciable choledocholithiasis. 5. 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  These results were called by telephone at the time of interpretation on 05/24/2015 at 8:16 am to Dr. Nicoletta Ba , who verbally acknowledged these results.   Electronically Signed   By: Ilona Sorrel M.D.   On: 05/24/2015 08:28   Ct Image Guided Drainage Percut Cath  Peritoneal Retroperit  05/24/2015   CLINICAL DATA:  Gallbladder fossa abscess  EXAM: CT-GUIDED GALLBLADDER FOSSA ABSCESS DRAINAGE  FLUOROSCOPY TIME:  None  MEDICATIONS AND MEDICAL HISTORY: Versed 3 mg, Fentanyl 50 mcg.  Additional Medications: None.  ANESTHESIA/SEDATION: Moderate sedation time: 20 minutes  CONTRAST:  None  PROCEDURE: The procedure, risks, benefits, and alternatives were explained to the patient. Questions regarding the procedure were encouraged and answered. The patient understands and consents to the procedure.  The right  upper quadrant was prepped with Betadine in a sterile fashion, and a sterile drape was applied covering the operative field. A sterile gown and sterile gloves were used for the procedure.  Under CT guidance, an 18 gauge needle was inserted into the gallbladder fossa abscess. It was removed over an Amplatz. A 10 French dilator followed by a 10 [place] drain were inserted. Three additional sideholes were cut because of the size of the abscess. It was string fixed and sewn to the skin. Pus was aspirated.  FINDINGS: Images document drain placement into a gallbladder fossa abscess.  COMPLICATIONS: None  IMPRESSION: Successful gallbladder fossa abscess drain.   Electronically Signed   By: Marybelle Killings M.D.   On: 05/24/2015 16:33     Assessment/Plan: Liver abscess = awaiting culture results, continue with piptazo for now. Will order picc line placement for anticipated long term abtx of 3 wk. Awaiting culture results to see if can narrow spectrum  Weight loss = recent 5 lb weight loss likely due to poor appetite from infection, anticipate it will improve now that his appetite is better  [facility] Cell: [phone] Pager: [phone]  05/25/2015, 3:12 PM

## 2015-05-25 NOTE — Progress Notes (Signed)
Patient ID: Neil [name], male   DOB: 1941-08-24, 74 y.o.   MRN: [id number]    Referring Physician(s): Gessner,C  Chief Complaint:  Gallbladder fossa abscess  Subjective:  Pt with improved appetite; still with soreness RUQ; denies N/V; has had some night sweats  Allergies: Ezetimibe and Statins  Medications: Prior to Admission medications   Medication Sig Start Date End Date Taking? Authorizing Provider  acetaminophen (TYLENOL) 500 MG tablet Take 1,500 mg by mouth 2 (two) times daily.    Yes Historical Provider, MD  benazepril (LOTENSIN) 40 MG tablet Take 1 tablet (40 mg total) by mouth daily. 03/10/15  Yes Biagio Borg, MD  fenofibrate 160 MG tablet Take 1 tablet (160 mg total) by mouth at bedtime. 03/10/15  Yes Biagio Borg, MD  sodium chloride (OCEAN) 0.65 % nasal spray Place 1 spray into the nose daily as needed for congestion.    Yes Historical Provider, MD  terazosin (HYTRIN) 2 MG capsule Take 1 capsule (2 mg total) by mouth once. Patient taking differently: Take 2 mg by mouth daily.  03/10/15  Yes Biagio Borg, MD     Vital Signs: BP 132/61 mmHg  Pulse 65  Temp(Src) 98.5 F (36.9 C) (Oral)  Resp 18  Ht 5' 7.24" (1.708 m)  Wt 153 lb 14.1 oz (69.8 kg)  BMI 23.93 kg/m2  SpO2 95%  Physical Exam  RUQ drain intact, output 35 cc's blood -tinged fluid; cx's pend; insertion site ok, mild-mod tender  Imaging: Mr 3d Recon At Scanner  05/24/2015   CLINICAL DATA:  74 year old male inpatient with a history of cholecystectomy on 03/02/15 requiring biliary stent placement and subsequent removal on 05/03/15, presents with fever, shaking chills, sweats, weakness and upper abdominal pain for 2 weeks.  EXAM: MRI ABDOMEN WITHOUT AND WITH CONTRAST (INCLUDING MRCP)  TECHNIQUE: Multiplanar multisequence MR imaging of the abdomen was performed both before and after the administration of intravenous contrast. Heavily T2-weighted images of the biliary and pancreatic ducts were obtained, and  three-dimensional MRCP images were rendered by post processing.  CONTRAST:  3mL MULTIHANCE GADOBENATE DIMEGLUMINE 529 MG/ML IV SOLN  COMPARISON:  02/11/2015 CT abdomen/pelvis. 04/08/2015 abdominal sonogram.  FINDINGS: Lower chest:  There is mild bibasilar atelectasis.  Hepatobiliary: There is a complex multilocular fluid collection centered within segment 4B of the liver measuring 8.1 x 7.9 x 10.2 cm, which is new since the 04/08/15 ultrasound and demonstrates a thick enhancing wall, thick enhancing internal septations and internal restricted diffusion, in keeping with a liver abscess. No additional liver fluid collections or masses. There is no convincing evidence of background hepatic steatosis on chemical shift imaging. The gallbladder is surgically absent. There is mild diffuse intrahepatic biliary ductal dilatation, similar to the 02/11/2015 CT study. The common bile duct is mildly to moderately dilated, measuring 9 mm in diameter. There are a few scattered low signal defects in the nondependent portion of the common bile duct and within the nondependent intrahepatic biliary tree in the left liver lobe, likely representing pneumobilia. There is enhancement of the wall of the common bile duct, suggesting cholangitis. There is smooth narrowing of the common bile duct at the porta hepatis, likely due to mass effect from the adjacent liver abscess. No round filling defects are seen within the biliary tree to suggest choledocholithiasis.  Pancreas: Normal pancreas, with no pancreatic mass, no main pancreatic duct dilation and no pancreatic or peripancreatic fluid collections. No definite evidence of pancreas divisum, although assessment for divisum is limited  by motion artifact.  Spleen: Normal.  Adrenals/Urinary Tract: Normal adrenals. Normal kidneys, with no renal mass and no hydronephrosis.  Stomach/Bowel: Collapsed and grossly normal stomach. Visualized small and large bowel are normal caliber.   Vascular/Lymphatic: There is a stable penetrating atherosclerotic ulcer his measuring 1.5 x 1.1 cm in the right posterior infrarenal abdominal aortic wall (series 1105/ image 74). Patent portal, splenic, hepatic and renal veins. Retroaortic left renal vein. No abdominal lymphadenopathy.  Other: No ascites.  Musculoskeletal: No gross suspicious focal osseous lesion.  IMPRESSION: 1. New 10.2 cm multilocular liver abscess centered in segment 4B. 2. Enhancing common bile duct wall, suggesting cholangitis. 3. Pneumobilia, likely due to a history of sphincterotomy. 4. Smooth narrowing of the common bile duct at the porta hepatis, likely due to mass effect from the adjacent liver abscess. No appreciable choledocholithiasis. 5. 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  These results were called by telephone at the time of interpretation on 05/24/2015 at 8:16 am to Dr. Nicoletta Ba , who verbally acknowledged these results.   Electronically Signed   By: Ilona Sorrel M.D.   On: 05/24/2015 08:28   Mr [name] W/wo Cm/mrcp  05/24/2015   CLINICAL DATA:  74 year old male inpatient with a history of cholecystectomy on 03/02/15 requiring biliary stent placement and subsequent removal on 05/03/15, presents with fever, shaking chills, sweats, weakness and upper abdominal pain for 2 weeks.  EXAM: MRI ABDOMEN WITHOUT AND WITH CONTRAST (INCLUDING MRCP)  TECHNIQUE: Multiplanar multisequence MR imaging of the abdomen was performed both before and after the administration of intravenous contrast. Heavily T2-weighted images of the biliary and pancreatic ducts were obtained, and three-dimensional MRCP images were rendered by post processing.  CONTRAST:  75mL MULTIHANCE GADOBENATE DIMEGLUMINE 529 MG/ML IV SOLN  COMPARISON:  02/11/2015 CT abdomen/pelvis. 04/08/2015 abdominal sonogram.  FINDINGS: Lower chest:  There is mild bibasilar atelectasis.  Hepatobiliary: There is a complex multilocular fluid  collection centered within segment 4B of the liver measuring 8.1 x 7.9 x 10.2 cm, which is new since the 04/08/15 ultrasound and demonstrates a thick enhancing wall, thick enhancing internal septations and internal restricted diffusion, in keeping with a liver abscess. No additional liver fluid collections or masses. There is no convincing evidence of background hepatic steatosis on chemical shift imaging. The gallbladder is surgically absent. There is mild diffuse intrahepatic biliary ductal dilatation, similar to the 02/11/2015 CT study. The common bile duct is mildly to moderately dilated, measuring 9 mm in diameter. There are a few scattered low signal defects in the nondependent portion of the common bile duct and within the nondependent intrahepatic biliary tree in the left liver lobe, likely representing pneumobilia. There is enhancement of the wall of the common bile duct, suggesting cholangitis. There is smooth narrowing of the common bile duct at the porta hepatis, likely due to mass effect from the adjacent liver abscess. No round filling defects are seen within the biliary tree to suggest choledocholithiasis.  Pancreas: Normal pancreas, with no pancreatic mass, no main pancreatic duct dilation and no pancreatic or peripancreatic fluid collections. No definite evidence of pancreas divisum, although assessment for divisum is limited by motion artifact.  Spleen: Normal.  Adrenals/Urinary Tract: Normal adrenals. Normal kidneys, with no renal mass and no hydronephrosis.  Stomach/Bowel: Collapsed and grossly normal stomach. Visualized small and large bowel are normal caliber.  Vascular/Lymphatic: There is a stable penetrating atherosclerotic ulcer his measuring 1.5 x 1.1 cm in the right posterior infrarenal abdominal aortic wall (  series 1105/ image 74). Patent portal, splenic, hepatic and renal veins. Retroaortic left renal vein. No abdominal lymphadenopathy.  Other: No ascites.  Musculoskeletal: No gross  suspicious focal osseous lesion.  IMPRESSION: 1. New 10.2 cm multilocular liver abscess centered in segment 4B. 2. Enhancing common bile duct wall, suggesting cholangitis. 3. Pneumobilia, likely due to a history of sphincterotomy. 4. Smooth narrowing of the common bile duct at the porta hepatis, likely due to mass effect from the adjacent liver abscess. No appreciable choledocholithiasis. 5. 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  These results were called by telephone at the time of interpretation on 05/24/2015 at 8:16 am to Dr. Nicoletta Ba , who verbally acknowledged these results.   Electronically Signed   By: Ilona Sorrel M.D.   On: 05/24/2015 08:28   Ct Image Guided Drainage Percut Cath  Peritoneal Retroperit  05/24/2015   CLINICAL DATA:  Gallbladder fossa abscess  EXAM: CT-GUIDED GALLBLADDER FOSSA ABSCESS DRAINAGE  FLUOROSCOPY TIME:  None  MEDICATIONS AND MEDICAL HISTORY: Versed 3 mg, Fentanyl 50 mcg.  Additional Medications: None.  ANESTHESIA/SEDATION: Moderate sedation time: 20 minutes  CONTRAST:  None  PROCEDURE: The procedure, risks, benefits, and alternatives were explained to the patient. Questions regarding the procedure were encouraged and answered. The patient understands and consents to the procedure.  The right upper quadrant was prepped with Betadine in a sterile fashion, and a sterile drape was applied covering the operative field. A sterile gown and sterile gloves were used for the procedure.  Under CT guidance, an 18 gauge needle was inserted into the gallbladder fossa abscess. It was removed over an Amplatz. A 10 French dilator followed by a 10 [place] drain were inserted. Three additional sideholes were cut because of the size of the abscess. It was string fixed and sewn to the skin. Pus was aspirated.  FINDINGS: Images document drain placement into a gallbladder fossa abscess.  COMPLICATIONS: None  IMPRESSION: Successful gallbladder fossa abscess  drain.   Electronically Signed   By: Marybelle Killings M.D.   On: 05/24/2015 16:33    Labs:  CBC:  Recent Labs  03/02/15 2056 04/01/15 1443 05/23/15 1135 05/24/15 0603  WBC 12.9* 9.0 20.0* 19.6*  HGB 11.6* 12.3* 10.2* 10.1*  HCT 36.5* 37.8* 31.8* 31.1*  PLT 176 361.0 545* 414*    COAGS:  Recent Labs  02/15/15 0731 05/23/15 1135  INR 0.9 1.25    BMP:  Recent Labs  12/16/14 0729 03/02/15 0528 03/02/15 2056 05/23/15 1135 05/24/15 0603  NA 141 140  --  136 137  K 4.4 4.3  --  3.7 3.7  CL 105 108  --  102 106  CO2 27 25  --  26 23  GLUCOSE 138* 133*  --  145* 141*  BUN 20 16  --  33* 27*  CALCIUM 9.7 8.4*  --  9.3 8.8*  CREATININE 1.37 1.48* 1.31* 1.67* 1.33*  GFRNONAA  --  46* 53* 39* 51*  GFRAA  --  53* >60 45* 60*    LIVER FUNCTION TESTS:  Recent Labs  03/02/15 0528 04/01/15 1443 05/23/15 1135 05/24/15 0603  BILITOT 0.7 0.4 0.8 0.9  AST 28 14 56* 50*  ALT 23 9 45 40  ALKPHOS 36* 44 71 68  PROT 5.3* 7.5 7.5 6.7  ALBUMIN 3.0* 4.1 2.9* 2.6*    Assessment and Plan:  S/p GB fossa abscess drainage 8/9; afebrile; latest WBC 19.6; check final cx's; check f/u CT once  output declines or within 1 week of placement  Signed: D. Rowe Robert 05/25/2015, 5:23 PM   I spent a total of 15 minutes in face to face in clinical consultation/evaluation, greater than 50% of which was counseling/coordinating care for gallbladder fossa abscess drain

## 2015-05-25 NOTE — Care Management Important Message (Signed)
Important Message  Patient Details  Name: TYVION EDMONDSON MRN: [id number] Date of Birth: 10/09/1941   Medicare Important Message Given:  [facility] notification given    Camillo Flaming 05/25/2015, 11:52 AMImportant Message  Patient Details  Name: LAKEN LOBATO MRN: [id number] Date of Birth: [birth year]   Medicare Important Message Given:  Yes-second notification given    Camillo Flaming 05/25/2015, 11:52 AM

## 2015-05-26 LAB — COMPREHENSIVE METABOLIC PANEL
ALT: 30 U/L (ref 17–63)
AST: 26 U/L (ref 15–41)
Albumin: 2.3 g/dL — ABNORMAL LOW (ref 3.5–5.0)
Alkaline Phosphatase: 64 U/L (ref 38–126)
Anion gap: 5 (ref 5–15)
BUN: 22 mg/dL — ABNORMAL HIGH (ref 6–20)
CO2: 28 mmol/L (ref 22–32)
Calcium: 8.7 mg/dL — ABNORMAL LOW (ref 8.9–10.3)
Chloride: 104 mmol/L (ref 101–111)
Creatinine, Ser: 1.22 mg/dL (ref 0.61–1.24)
GFR calc Af Amer: >60 mL/min (ref >60)
GFR calc non Af Amer: 57 mL/min — ABNORMAL LOW (ref >60)
Glucose, Bld: 124 mg/dL — ABNORMAL HIGH (ref 65–99)
Potassium: 3.8 mmol/L (ref 3.5–5.1)
Sodium: 137 mmol/L (ref 135–145)
Total Bilirubin: 0.2 mg/dL — ABNORMAL LOW (ref 0.3–1.2)
Total Protein: 5.9 g/dL — ABNORMAL LOW (ref 6.5–8.1)

## 2015-05-26 LAB — CBC
HCT: 29.6 % — ABNORMAL LOW (ref 39.0–52.0)
Hemoglobin: 9.3 g/dL — ABNORMAL LOW (ref 13.0–17.0)
MCH: 27 pg (ref 26.0–34.0)
MCHC: 31.4 g/dL (ref 30.0–36.0)
MCV: 86 fL (ref 78.0–100.0)
Platelets: 401 10*3/uL — ABNORMAL HIGH (ref 150–400)
RBC: 3.44 MIL/uL — ABNORMAL LOW (ref 4.22–5.81)
RDW: 14.8 % (ref 11.5–15.5)
WBC: 12.5 10*3/uL — ABNORMAL HIGH (ref 4.0–10.5)

## 2015-05-26 MED ORDER — POLYETHYLENE GLYCOL 3350 17 G PO PACK: 17.0000 g | PACK | Freq: Every day | ORAL | Status: DC | PRN

## 2015-05-26 MED ORDER — SODIUM CHLORIDE 0.9 % IJ SOLN
10.0000 mL | INTRAMUSCULAR | Status: DC | PRN
  Administered 2015-05-30 (×2): 10 mL
  Filled 2015-05-26 (×2): qty 40

## 2015-05-26 MED ORDER — DOCUSATE SODIUM 100 MG PO CAPS
100.0000 mg | ORAL_CAPSULE | Freq: Every day | ORAL | Status: DC
  Administered 2015-05-26 – 2015-05-29 (×4): 100 mg via ORAL
  Filled 2015-05-26 (×5): qty 1

## 2015-05-26 MED ORDER — POLYETHYLENE GLYCOL 3350 17 G PO PACK
17.0000 g | PACK | Freq: Every day | ORAL | Status: DC
  Administered 2015-05-26: 17 g via ORAL
  Filled 2015-05-26 (×5): qty 1

## 2015-05-26 MED ORDER — BISACODYL 10 MG RE SUPP: 10.0000 mg | Freq: Once | RECTAL | Status: DC

## 2015-05-26 MED ORDER — DOCUSATE SODIUM 100 MG PO CAPS: 100.0000 mg | ORAL_CAPSULE | Freq: Every day | ORAL | Status: DC

## 2015-05-26 NOTE — Progress Notes (Signed)
    Progress Note   Subjective  feels okay. Endorses constipation, last BM was Monday. Takes stool softener at home   Objective   Vital signs in last 24 hours: Temp:  [98 F (36.7 C)-98.5 F (36.9 C)] 98 F (36.7 C) (08/11 0539) Pulse Rate:  [65-70] 65 (08/11 0539) Resp:  [18] 18 (08/11 0539) BP: (132-156)/(61-75) 137/67 mmHg (08/11 0539) SpO2:  [95 %-98 %] 97 % (08/11 0539) Last BM Date: 05/23/15 General:    white male in NAD Heart:  Regular rate and rhythm Lungs: Respirations even and unlabored, lungs CTA bilaterally Abdomen:  Soft, nontender and nondistended. Normal bowel sounds. No drainage in perc drain Extremities:  Without edema. Neurologic:  Alert and oriented,  grossly normal neurologically. Psych:  Cooperative. Normal mood and affect.  Intake/Output from previous day: 08/10 0701 - 08/11 0700 In: 640 [P.O.:480; IV Piggyback:150] Out: 590 [Urine:525; Drains:65] Intake/Output this shift: Total I/O In: 240 [P.O.:240] Out: 410 [Urine:400; Drains:10]  Lab Results:  Recent Labs  05/23/15 1135 05/24/15 0603 05/26/15 0510  WBC 20.0* 19.6* 12.5*  HGB 10.2* 10.1* 9.3*  HCT 31.8* 31.1* 29.6*  PLT 545* 414* 401*   BMET  Recent Labs  05/23/15 1135 05/24/15 0603 05/26/15 0510  NA 136 137 137  K 3.7 3.7 3.8  CL 102 106 104  CO2 26 23 28   GLUCOSE 145* 141* 124*  BUN 33* 27* 22*  CREATININE 1.67* 1.33* 1.22  CALCIUM 9.3 8.8* 8.7*   LFT  Recent Labs  05/26/15 0510  PROT 5.9*  ALBUMIN 2.3*  AST 26  ALT 30  ALKPHOS 64  BILITOT 0.2*      Assessment / Plan:     50. 74 year old male with liver abscess, s/p drain placement to GB fossa by IR. Culture pending but gram stain reveals abundant WBCs, moderate GPC in pairs and clusters. On Zosyn. ID will narrow coverage if appropriate based on culture. Patient is afebrile, WBC normalizing. Appreciate ID and IR assistance. He is feeling better. Will place order for PICC, his blood cultures are negative.   2.  Acute renal insuff, resolving.   3. Normocytic anemia without signs of bleeding.   4. Hypertension - off home meds - benazapril probably related to AKI. BP okay 137/67.   5. Hypoalbuminemia, probably malnutrition related to illness. His appetite is rapidly improving.   6. Constipation. He wants to restart home Colace. Will also leave prn Mirlax order.     LOS: 3 days   Tye Savoy  05/26/2015, 10:39 AM   Patient seen yesterday and agree with above note - My Hx and PE same Will do nightly MiraLax  Gatha Mayer, MD, [facility]

## 2015-05-26 NOTE — Progress Notes (Signed)
Peripherally Inserted Central Catheter/Midline Placement  The IV Nurse has discussed with the patient and/or persons authorized to consent for the patient, the purpose of this procedure and the potential benefits and risks involved with this procedure.  The benefits include less needle sticks, lab draws from the catheter and patient may be discharged home with the catheter.  Risks include, but not limited to, infection, bleeding, blood clot (thrombus formation), and puncture of an artery; nerve damage and irregular heat beat.  Alternatives to this procedure were also discussed.  PICC/Midline Placement Documentation  PICC / Midline Single Lumen 37/54/36 PICC Right Basilic 39 cm 0 cm (Active)  Exposed Catheter (cm) 0 cm 05/26/2015  4:05 PM  Dressing Change Due 06/02/15 05/26/2015  4:05 PM       Katja Blue, Maricela Bo 05/26/2015, 4:06 PM

## 2015-05-26 NOTE — Care Management Note (Signed)
Case Management Note  Patient Details  Name: ILIR MAHRT MRN: [id number] Date of Birth: [birth year]  Subjective/Objective:                  Gallbladder fossa abscess  Action/Plan: Discharge planning  Expected Discharge Date:                  Expected Discharge Plan:  [place]  In-House Referral:  NA  Discharge planning Services  CM Consult  Post Acute Care Choice:    Choice offered to:     DME Arranged:  IV pump/equipment DME Agency:  [place]:  RN [facility] Agency:  [place]  Status of Service:  In process, will continue to follow  Medicare Important Message Given:  Yes-second notification given Date Medicare IM Given:    Medicare IM give by:    Date Additional Medicare IM Given:    Additional Medicare Important Message give by:     If discussed at [place] of Stay Meetings, dates discussed:    Additional Comments: CM notes order for PICC for possible long term IV ABX; called [facility] to make aware and [facility] rep, Cyril Mourning states pt on their radar and will continue to follow.  CM will follow for progression. Dellie Catholic, RN 05/26/2015, 3:29 PM

## 2015-05-26 NOTE — Progress Notes (Signed)
Patient ID: Neil [name], male   DOB: 03-09-1941, 74 y.o.   MRN: [id number]    Referring Physician(s): Carlean Purl  Chief Complaint:  Gallbladder fossa/hepatic abscess  Subjective: Patient doing okay today; still sore in right upper quadrant; tolerating diet; denies nausea/ vomiting; constipated   Allergies: Ezetimibe and Statins  Medications: Prior to Admission medications   Medication Sig Start Date End Date Taking? Authorizing Provider  acetaminophen (TYLENOL) 500 MG tablet Take 1,500 mg by mouth 2 (two) times daily.    Yes Historical Provider, MD  benazepril (LOTENSIN) 40 MG tablet Take 1 tablet (40 mg total) by mouth daily. 03/10/15  Yes Biagio Borg, MD  fenofibrate 160 MG tablet Take 1 tablet (160 mg total) by mouth at bedtime. 03/10/15  Yes Biagio Borg, MD  sodium chloride (OCEAN) 0.65 % nasal spray Place 1 spray into the nose daily as needed for congestion.    Yes Historical Provider, MD  terazosin (HYTRIN) 2 MG capsule Take 1 capsule (2 mg total) by mouth once. Patient taking differently: Take 2 mg by mouth daily.  03/10/15  Yes Biagio Borg, MD     Vital Signs: BP 137/67 mmHg  Pulse 65  Temp(Src) 98 F (36.7 C) (Oral)  Resp 18  Ht 5' 7.24" (1.708 m)  Wt 153 lb 14.1 oz (69.8 kg)  BMI 23.93 kg/m2  SpO2 97%  Physical Exam patient awake alert, right upper quadrant drain intact, insertion site okay, mildly moderately tender, output 10 mL serous fluid-cultures pending  Imaging: Mr 3d Recon At Scanner  05/24/2015   CLINICAL DATA:  74 year old male inpatient with a history of cholecystectomy on 03/02/15 requiring biliary stent placement and subsequent removal on 05/03/15, presents with fever, shaking chills, sweats, weakness and upper abdominal pain for 2 weeks.  EXAM: MRI ABDOMEN WITHOUT AND WITH CONTRAST (INCLUDING MRCP)  TECHNIQUE: Multiplanar multisequence MR imaging of the abdomen was performed both before and after the administration of intravenous contrast. Heavily  T2-weighted images of the biliary and pancreatic ducts were obtained, and three-dimensional MRCP images were rendered by post processing.  CONTRAST:  21mL MULTIHANCE GADOBENATE DIMEGLUMINE 529 MG/ML IV SOLN  COMPARISON:  02/11/2015 CT abdomen/pelvis. 04/08/2015 abdominal sonogram.  FINDINGS: Lower chest:  There is mild bibasilar atelectasis.  Hepatobiliary: There is a complex multilocular fluid collection centered within segment 4B of the liver measuring 8.1 x 7.9 x 10.2 cm, which is new since the 04/08/15 ultrasound and demonstrates a thick enhancing wall, thick enhancing internal septations and internal restricted diffusion, in keeping with a liver abscess. No additional liver fluid collections or masses. There is no convincing evidence of background hepatic steatosis on chemical shift imaging. The gallbladder is surgically absent. There is mild diffuse intrahepatic biliary ductal dilatation, similar to the 02/11/2015 CT study. The common bile duct is mildly to moderately dilated, measuring 9 mm in diameter. There are a few scattered low signal defects in the nondependent portion of the common bile duct and within the nondependent intrahepatic biliary tree in the left liver lobe, likely representing pneumobilia. There is enhancement of the wall of the common bile duct, suggesting cholangitis. There is smooth narrowing of the common bile duct at the porta hepatis, likely due to mass effect from the adjacent liver abscess. No round filling defects are seen within the biliary tree to suggest choledocholithiasis.  Pancreas: Normal pancreas, with no pancreatic mass, no main pancreatic duct dilation and no pancreatic or peripancreatic fluid collections. No definite evidence of pancreas divisum, although assessment  for divisum is limited by motion artifact.  Spleen: Normal.  Adrenals/Urinary Tract: Normal adrenals. Normal kidneys, with no renal mass and no hydronephrosis.  Stomach/Bowel: Collapsed and grossly normal  stomach. Visualized small and large bowel are normal caliber.  Vascular/Lymphatic: There is a stable penetrating atherosclerotic ulcer his measuring 1.5 x 1.1 cm in the right posterior infrarenal abdominal aortic wall (series 1105/ image 74). Patent portal, splenic, hepatic and renal veins. Retroaortic left renal vein. No abdominal lymphadenopathy.  Other: No ascites.  Musculoskeletal: No gross suspicious focal osseous lesion.  IMPRESSION: 1. New 10.2 cm multilocular liver abscess centered in segment 4B. 2. Enhancing common bile duct wall, suggesting cholangitis. 3. Pneumobilia, likely due to a history of sphincterotomy. 4. Smooth narrowing of the common bile duct at the porta hepatis, likely due to mass effect from the adjacent liver abscess. No appreciable choledocholithiasis. 5. 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  These results were called by telephone at the time of interpretation on 05/24/2015 at 8:16 am to Dr. Nicoletta Ba , who verbally acknowledged these results.   Electronically Signed   By: Ilona Sorrel M.D.   On: 05/24/2015 08:28   Mr [name] W/wo Cm/mrcp  05/24/2015   CLINICAL DATA:  74 year old male inpatient with a history of cholecystectomy on 03/02/15 requiring biliary stent placement and subsequent removal on 05/03/15, presents with fever, shaking chills, sweats, weakness and upper abdominal pain for 2 weeks.  EXAM: MRI ABDOMEN WITHOUT AND WITH CONTRAST (INCLUDING MRCP)  TECHNIQUE: Multiplanar multisequence MR imaging of the abdomen was performed both before and after the administration of intravenous contrast. Heavily T2-weighted images of the biliary and pancreatic ducts were obtained, and three-dimensional MRCP images were rendered by post processing.  CONTRAST:  38mL MULTIHANCE GADOBENATE DIMEGLUMINE 529 MG/ML IV SOLN  COMPARISON:  02/11/2015 CT abdomen/pelvis. 04/08/2015 abdominal sonogram.  FINDINGS: Lower chest:  There is mild bibasilar atelectasis.   Hepatobiliary: There is a complex multilocular fluid collection centered within segment 4B of the liver measuring 8.1 x 7.9 x 10.2 cm, which is new since the 04/08/15 ultrasound and demonstrates a thick enhancing wall, thick enhancing internal septations and internal restricted diffusion, in keeping with a liver abscess. No additional liver fluid collections or masses. There is no convincing evidence of background hepatic steatosis on chemical shift imaging. The gallbladder is surgically absent. There is mild diffuse intrahepatic biliary ductal dilatation, similar to the 02/11/2015 CT study. The common bile duct is mildly to moderately dilated, measuring 9 mm in diameter. There are a few scattered low signal defects in the nondependent portion of the common bile duct and within the nondependent intrahepatic biliary tree in the left liver lobe, likely representing pneumobilia. There is enhancement of the wall of the common bile duct, suggesting cholangitis. There is smooth narrowing of the common bile duct at the porta hepatis, likely due to mass effect from the adjacent liver abscess. No round filling defects are seen within the biliary tree to suggest choledocholithiasis.  Pancreas: Normal pancreas, with no pancreatic mass, no main pancreatic duct dilation and no pancreatic or peripancreatic fluid collections. No definite evidence of pancreas divisum, although assessment for divisum is limited by motion artifact.  Spleen: Normal.  Adrenals/Urinary Tract: Normal adrenals. Normal kidneys, with no renal mass and no hydronephrosis.  Stomach/Bowel: Collapsed and grossly normal stomach. Visualized small and large bowel are normal caliber.  Vascular/Lymphatic: There is a stable penetrating atherosclerotic ulcer his measuring 1.5 x 1.1 cm in the right posterior  infrarenal abdominal aortic wall (series 1105/ image 74). Patent portal, splenic, hepatic and renal veins. Retroaortic left renal vein. No abdominal lymphadenopathy.   Other: No ascites.  Musculoskeletal: No gross suspicious focal osseous lesion.  IMPRESSION: 1. New 10.2 cm multilocular liver abscess centered in segment 4B. 2. Enhancing common bile duct wall, suggesting cholangitis. 3. Pneumobilia, likely due to a history of sphincterotomy. 4. Smooth narrowing of the common bile duct at the porta hepatis, likely due to mass effect from the adjacent liver abscess. No appreciable choledocholithiasis. 5. 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  These results were called by telephone at the time of interpretation on 05/24/2015 at 8:16 am to Dr. Nicoletta Ba , who verbally acknowledged these results.   Electronically Signed   By: Ilona Sorrel M.D.   On: 05/24/2015 08:28   Ct Image Guided Drainage Percut Cath  Peritoneal Retroperit  05/24/2015   CLINICAL DATA:  Gallbladder fossa abscess  EXAM: CT-GUIDED GALLBLADDER FOSSA ABSCESS DRAINAGE  FLUOROSCOPY TIME:  None  MEDICATIONS AND MEDICAL HISTORY: Versed 3 mg, Fentanyl 50 mcg.  Additional Medications: None.  ANESTHESIA/SEDATION: Moderate sedation time: 20 minutes  CONTRAST:  None  PROCEDURE: The procedure, risks, benefits, and alternatives were explained to the patient. Questions regarding the procedure were encouraged and answered. The patient understands and consents to the procedure.  The right upper quadrant was prepped with Betadine in a sterile fashion, and a sterile drape was applied covering the operative field. A sterile gown and sterile gloves were used for the procedure.  Under CT guidance, an 18 gauge needle was inserted into the gallbladder fossa abscess. It was removed over an Amplatz. A 10 French dilator followed by a 10 [place] drain were inserted. Three additional sideholes were cut because of the size of the abscess. It was string fixed and sewn to the skin. Pus was aspirated.  FINDINGS: Images document drain placement into a gallbladder fossa abscess.  COMPLICATIONS: None   IMPRESSION: Successful gallbladder fossa abscess drain.   Electronically Signed   By: Marybelle Killings M.D.   On: 05/24/2015 16:33    Labs:  CBC:  Recent Labs  04/01/15 1443 05/23/15 1135 05/24/15 0603 05/26/15 0510  WBC 9.0 20.0* 19.6* 12.5*  HGB 12.3* 10.2* 10.1* 9.3*  HCT 37.8* 31.8* 31.1* 29.6*  PLT 361.0 545* 414* 401*    COAGS:  Recent Labs  02/15/15 0731 05/23/15 1135  INR 0.9 1.25    BMP:  Recent Labs  03/02/15 0528 03/02/15 2056 05/23/15 1135 05/24/15 0603 05/26/15 0510  NA 140  --  136 137 137  K 4.3  --  3.7 3.7 3.8  CL 108  --  102 106 104  CO2 25  --  26 23 28   GLUCOSE 133*  --  145* 141* 124*  BUN 16  --  33* 27* 22*  CALCIUM 8.4*  --  9.3 8.8* 8.7*  CREATININE 1.48* 1.31* 1.67* 1.33* 1.22  GFRNONAA 46* 53* 39* 51* 57*  GFRAA 53* >60 45* 60* >60    LIVER FUNCTION TESTS:  Recent Labs  04/01/15 1443 05/23/15 1135 05/24/15 0603 05/26/15 0510  BILITOT 0.4 0.8 0.9 0.2*  AST 14 56* 50* 26  ALT 9 45 40 30  ALKPHOS 44 71 68 64  PROT 7.5 7.5 6.7 5.9*  ALBUMIN 4.1 2.9* 2.6* 2.3*    Assessment and Plan:  Status post gallbladder fossa/hepatic abscess drainage 8/9; WBC down to 12.5, hemoglobin stable, creatinine normal, abscess cultures  pending; if creatinine continues to be normal and drain output remains minimal would recheck f/u CT A/P with intravenous contrast within next 48-72 hours. Continue drain irrigation.  Signed: D. Rowe Robert 05/26/2015, 11:33 AM   I spent a total of 15 minutes in face to face in clinical consultation/evaluation, greater than 50% of which was counseling/coordinating care for gallbladder fossa abscess drain

## 2015-05-27 DIAGNOSIS — A491 Streptococcal infection, unspecified site: Secondary | ICD-10-CM | POA: Insufficient documentation

## 2015-05-27 DIAGNOSIS — R630 Anorexia: Secondary | ICD-10-CM | POA: Insufficient documentation

## 2015-05-27 DIAGNOSIS — K750 Abscess of liver: Principal | ICD-10-CM

## 2015-05-27 DIAGNOSIS — R634 Abnormal weight loss: Secondary | ICD-10-CM

## 2015-05-27 DIAGNOSIS — B954 Other streptococcus as the cause of diseases classified elsewhere: Secondary | ICD-10-CM

## 2015-05-27 DIAGNOSIS — B9689 Other specified bacterial agents as the cause of diseases classified elsewhere: Secondary | ICD-10-CM

## 2015-05-27 LAB — CULTURE, ROUTINE-ABSCESS

## 2015-05-27 MED ORDER — DEXTROSE 5 % IV SOLN
2.0000 g | INTRAVENOUS | Status: DC
  Administered 2015-05-27 – 2015-05-30 (×4): 2 g via INTRAVENOUS
  Filled 2015-05-27 (×4): qty 2

## 2015-05-27 NOTE — Progress Notes (Addendum)
    [facility] for Infectious Disease    Date of Admission:  05/23/2015   Total days of antibiotics 5        Day 5 piptazo           ID: Neil [name] is a 74 y.o. male with liver abscess Active Problems:   Liver abscess   Normocytic anemia   Acute kidney injury   Essential hypertension, benign   Abscess, hepatic   Malnutrition of moderate degree    Subjective: Afebrile. Had picc line placed yesterday  Medications:  . docusate sodium  100 mg Oral QHS  . piperacillin-tazobactam (ZOSYN)  IV  3.375 g Intravenous Q8H  . polyethylene glycol  17 g Oral QHS    Objective: Vital signs in last 24 hours: Temp:  [98.5 F (36.9 C)-99.3 F (37.4 C)] 98.5 F (36.9 C) (08/12 0605) Pulse Rate:  [64-71] 64 (08/12 0605) Resp:  [18] 18 (08/12 0605) BP: (159)/(74-78) 159/78 mmHg (08/12 0605) SpO2:  [97 %-99 %] 99 % (08/12 5726) Physical Exam  Constitutional: He is oriented to person, place, and time. He appears well-developed and well-nourished. No distress.  HENT:  Mouth/Throat: Oropharynx is clear and moist. No oropharyngeal exudate.  Cardiovascular: Normal rate, regular rhythm and normal heart sounds. Exam reveals no gallop and no friction rub.  No murmur heard.  Pulmonary/Chest: Effort normal and breath sounds normal. No respiratory distress. He has no wheezes.  Abdominal: Soft. Bowel sounds are normal. He exhibits no distension. There is no tenderness. RUQ drain with serosanguinous fluid in bulb Skin: Skin is warm and dry. No rash noted. No erythema.  Psychiatric: He has a normal mood and affect. His behavior is normal.     Lab Results  Recent Labs  05/26/15 0510  WBC 12.5*  HGB 9.3*  HCT 29.6*  NA 137  K 3.8  CL 104  CO2 28  BUN 22*  CREATININE 1.22   Liver Panel  Recent Labs  05/26/15 0510  PROT 5.9*  ALBUMIN 2.3*  AST 26  ALT 30  ALKPHOS 64  BILITOT 0.2*    Microbiology: 8/8 blood cx ngtd 8/9 aspirate cx gpc in prs Studies/Results: No results  found.   Assessment/Plan: Liver abscess = spoke with micro lab, cx identified viridans strep and lactobacillus. Plan to treat with broad spectrum for 3 wk with ceftriaxone 2gm IV daily. Can discharge once felt ready to go home per primary team. Will need weekly cbc and bmp by home health, in addition to weekly picc line dressing changes.  Weight loss = recent 5 lb weight loss likely due to poor appetite from infection, anticipate it will improve now that his appetite is better  Will arrange for follow up in the ID clinic. Recommend repeat imaging in 3 wk to determine if need to prolong iv atbx.  Will sign off.  Baxter Flattery [facility] for Infectious Diseases Cell: [phone] Pager: [phone]  05/27/2015, 9:23 AM

## 2015-05-27 NOTE — Progress Notes (Signed)
    Progress Note   Subjective  No specific complaints    Objective   Vital signs in last 24 hours: Temp:  [98.5 F (36.9 C)-99.3 F (37.4 C)] 98.5 F (36.9 C) (08/12 0605) Pulse Rate:  [64-71] 64 (08/12 0605) Resp:  [18] 18 (08/12 0605) BP: (159)/(74-78) 159/78 mmHg (08/12 0605) SpO2:  [97 %-99 %] 99 % (08/12 0605) Last BM Date: 05/23/15 General:    white male in NAD Heart:  Regular rate and rhythm Lungs: Respirations even and unlabored, lungs CTA bilaterally Abdomen:  Soft, nontender and nondistended. Normal bowel sounds. Extremities:  Without edema. Neurologic:  Alert and oriented,  grossly normal neurologically. Psych:  Cooperative. Normal mood and affect.      Assessment / Plan:   21. 74 year old male with liver abscess, s/p drain placement to GB fossa by IR. Culture pending but gram stain reveals abundant WBCs, moderate GPC in pairs and clusters. Tmax 99.3, WBC normalizing. On Zosyn. ID will narrow coverage if appropriate based on culture which is still pending. Minimal drain output. Will need repeat CTscan in next couple of days.  PICC placed yesterday in preparation for home antibiotics.  [facility] to see patient, they made contact with wife yesterday. .    2. Acute renal insuff, resolving. Follow up BMET in am  3. Normocytic anemia without signs of bleeding.   4. Hypertension - off home meds - benazapril probably related to AKI. BP up  Today - 159/74. Will recheck am BMET. If renal function back to normal will restart home BP meds.    5. Hypoalbuminemia, probably malnutrition related to illness. His appetite is rapidly improving.   6. Constipation. Restarted home Colace yesterday and prn Mirlax orderd. He had a BM this am. Continue colace    LOS: 4 days   Neil [name]  05/27/2015, 9:19 AM   [facility] Attending  I have also seen and assessed the patient and agree with the advanced practitioner's assessment and plan. Has viridans strep and is now on  CTX Ct Sun - home Mon w/ home Abx unless needs drain manipulation  Gatha Mayer, MD, [facility] [phone] (pager) 05/27/2015 4:39 PM

## 2015-05-27 NOTE — Progress Notes (Signed)
[facility]  Patient Status: New (Please fax IV antibiotic rx and Home Health order to [phone] if completed after hours)  [facility] is providing the following services: RN and Home Infusion Services (teaching and education will be done by nurse in the home with patient and caregiver)  If patient discharges after hours, please call [phone].   Lurlean Leyden 05/27/2015, 4:40 PM

## 2015-05-27 NOTE — Progress Notes (Signed)
Patient ID: NISHAAN [name], male   DOB: [birth year], 74 y.o.   MRN: [id number]    Referring Physician(s): Gessner,C  Chief Complaint:  GB fossa/hepatic abscess  Subjective:  Pt ambulating in hallway; no new c/o  Allergies: Ezetimibe and Statins  Medications: Prior to Admission medications   Medication Sig Start Date End Date Taking? Authorizing Provider  acetaminophen (TYLENOL) 500 MG tablet Take 1,500 mg by mouth 2 (two) times daily.    Yes Historical Provider, MD  benazepril (LOTENSIN) 40 MG tablet Take 1 tablet (40 mg total) by mouth daily. 03/10/15  Yes Biagio Borg, MD  fenofibrate 160 MG tablet Take 1 tablet (160 mg total) by mouth at bedtime. 03/10/15  Yes Biagio Borg, MD  sodium chloride (OCEAN) 0.65 % nasal spray Place 1 spray into the nose daily as needed for congestion.    Yes Historical Provider, MD  terazosin (HYTRIN) 2 MG capsule Take 1 capsule (2 mg total) by mouth once. Patient taking differently: Take 2 mg by mouth daily.  03/10/15  Yes Biagio Borg, MD     Vital Signs: BP 154/60 mmHg  Pulse 61  Temp(Src) 98.4 F (36.9 C) (Oral)  Resp 18  Ht 5' 7.24" (1.708 m)  Wt 153 lb 14.1 oz (69.8 kg)  BMI 23.93 kg/m2  SpO2 98%  Physical Exam pt awake/alert; RUQ drain intact, insertion site ok; mildly tender; output 20 cc's turbid, beige colored fluid; drain irrigated with 5cc sterile NS  Imaging: Mr 3d Recon At Scanner  05/24/2015   CLINICAL DATA:  74 year old male inpatient with a history of cholecystectomy on 03/02/15 requiring biliary stent placement and subsequent removal on 05/03/15, presents with fever, shaking chills, sweats, weakness and upper abdominal pain for 2 weeks.  EXAM: MRI ABDOMEN WITHOUT AND WITH CONTRAST (INCLUDING MRCP)  TECHNIQUE: Multiplanar multisequence MR imaging of the abdomen was performed both before and after the administration of intravenous contrast. Heavily T2-weighted images of the biliary and pancreatic ducts were obtained, and  three-dimensional MRCP images were rendered by post processing.  CONTRAST:  23mL MULTIHANCE GADOBENATE DIMEGLUMINE 529 MG/ML IV SOLN  COMPARISON:  02/11/2015 CT abdomen/pelvis. 04/08/2015 abdominal sonogram.  FINDINGS: Lower chest:  There is mild bibasilar atelectasis.  Hepatobiliary: There is a complex multilocular fluid collection centered within segment 4B of the liver measuring 8.1 x 7.9 x 10.2 cm, which is new since the 04/08/15 ultrasound and demonstrates a thick enhancing wall, thick enhancing internal septations and internal restricted diffusion, in keeping with a liver abscess. No additional liver fluid collections or masses. There is no convincing evidence of background hepatic steatosis on chemical shift imaging. The gallbladder is surgically absent. There is mild diffuse intrahepatic biliary ductal dilatation, similar to the 02/11/2015 CT study. The common bile duct is mildly to moderately dilated, measuring 9 mm in diameter. There are a few scattered low signal defects in the nondependent portion of the common bile duct and within the nondependent intrahepatic biliary tree in the left liver lobe, likely representing pneumobilia. There is enhancement of the wall of the common bile duct, suggesting cholangitis. There is smooth narrowing of the common bile duct at the porta hepatis, likely due to mass effect from the adjacent liver abscess. No round filling defects are seen within the biliary tree to suggest choledocholithiasis.  Pancreas: Normal pancreas, with no pancreatic mass, no main pancreatic duct dilation and no pancreatic or peripancreatic fluid collections. No definite evidence of pancreas divisum, although assessment for divisum is limited by motion  artifact.  Spleen: Normal.  Adrenals/Urinary Tract: Normal adrenals. Normal kidneys, with no renal mass and no hydronephrosis.  Stomach/Bowel: Collapsed and grossly normal stomach. Visualized small and large bowel are normal caliber.   Vascular/Lymphatic: There is a stable penetrating atherosclerotic ulcer his measuring 1.5 x 1.1 cm in the right posterior infrarenal abdominal aortic wall (series 1105/ image 74). Patent portal, splenic, hepatic and renal veins. Retroaortic left renal vein. No abdominal lymphadenopathy.  Other: No ascites.  Musculoskeletal: No gross suspicious focal osseous lesion.  IMPRESSION: 1. New 10.2 cm multilocular liver abscess centered in segment 4B. 2. Enhancing common bile duct wall, suggesting cholangitis. 3. Pneumobilia, likely due to a history of sphincterotomy. 4. Smooth narrowing of the common bile duct at the porta hepatis, likely due to mass effect from the adjacent liver abscess. No appreciable choledocholithiasis. 5. 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  These results were called by telephone at the time of interpretation on 05/24/2015 at 8:16 am to Dr. Nicoletta Ba , who verbally acknowledged these results.   Electronically Signed   By: Ilona Sorrel M.D.   On: 05/24/2015 08:28   Mr [name] W/wo Cm/mrcp  05/24/2015   CLINICAL DATA:  74 year old male inpatient with a history of cholecystectomy on 03/02/15 requiring biliary stent placement and subsequent removal on 05/03/15, presents with fever, shaking chills, sweats, weakness and upper abdominal pain for 2 weeks.  EXAM: MRI ABDOMEN WITHOUT AND WITH CONTRAST (INCLUDING MRCP)  TECHNIQUE: Multiplanar multisequence MR imaging of the abdomen was performed both before and after the administration of intravenous contrast. Heavily T2-weighted images of the biliary and pancreatic ducts were obtained, and three-dimensional MRCP images were rendered by post processing.  CONTRAST:  12mL MULTIHANCE GADOBENATE DIMEGLUMINE 529 MG/ML IV SOLN  COMPARISON:  02/11/2015 CT abdomen/pelvis. 04/08/2015 abdominal sonogram.  FINDINGS: Lower chest:  There is mild bibasilar atelectasis.  Hepatobiliary: There is a complex multilocular fluid  collection centered within segment 4B of the liver measuring 8.1 x 7.9 x 10.2 cm, which is new since the 04/08/15 ultrasound and demonstrates a thick enhancing wall, thick enhancing internal septations and internal restricted diffusion, in keeping with a liver abscess. No additional liver fluid collections or masses. There is no convincing evidence of background hepatic steatosis on chemical shift imaging. The gallbladder is surgically absent. There is mild diffuse intrahepatic biliary ductal dilatation, similar to the 02/11/2015 CT study. The common bile duct is mildly to moderately dilated, measuring 9 mm in diameter. There are a few scattered low signal defects in the nondependent portion of the common bile duct and within the nondependent intrahepatic biliary tree in the left liver lobe, likely representing pneumobilia. There is enhancement of the wall of the common bile duct, suggesting cholangitis. There is smooth narrowing of the common bile duct at the porta hepatis, likely due to mass effect from the adjacent liver abscess. No round filling defects are seen within the biliary tree to suggest choledocholithiasis.  Pancreas: Normal pancreas, with no pancreatic mass, no main pancreatic duct dilation and no pancreatic or peripancreatic fluid collections. No definite evidence of pancreas divisum, although assessment for divisum is limited by motion artifact.  Spleen: Normal.  Adrenals/Urinary Tract: Normal adrenals. Normal kidneys, with no renal mass and no hydronephrosis.  Stomach/Bowel: Collapsed and grossly normal stomach. Visualized small and large bowel are normal caliber.  Vascular/Lymphatic: There is a stable penetrating atherosclerotic ulcer his measuring 1.5 x 1.1 cm in the right posterior infrarenal abdominal aortic wall (series 1105/  image 74). Patent portal, splenic, hepatic and renal veins. Retroaortic left renal vein. No abdominal lymphadenopathy.  Other: No ascites.  Musculoskeletal: No gross  suspicious focal osseous lesion.  IMPRESSION: 1. New 10.2 cm multilocular liver abscess centered in segment 4B. 2. Enhancing common bile duct wall, suggesting cholangitis. 3. Pneumobilia, likely due to a history of sphincterotomy. 4. Smooth narrowing of the common bile duct at the porta hepatis, likely due to mass effect from the adjacent liver abscess. No appreciable choledocholithiasis. 5. 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  These results were called by telephone at the time of interpretation on 05/24/2015 at 8:16 am to Dr. Nicoletta Ba , who verbally acknowledged these results.   Electronically Signed   By: Ilona Sorrel M.D.   On: 05/24/2015 08:28   Ct Image Guided Drainage Percut Cath  Peritoneal Retroperit  05/24/2015   CLINICAL DATA:  Gallbladder fossa abscess  EXAM: CT-GUIDED GALLBLADDER FOSSA ABSCESS DRAINAGE  FLUOROSCOPY TIME:  None  MEDICATIONS AND MEDICAL HISTORY: Versed 3 mg, Fentanyl 50 mcg.  Additional Medications: None.  ANESTHESIA/SEDATION: Moderate sedation time: 20 minutes  CONTRAST:  None  PROCEDURE: The procedure, risks, benefits, and alternatives were explained to the patient. Questions regarding the procedure were encouraged and answered. The patient understands and consents to the procedure.  The right upper quadrant was prepped with Betadine in a sterile fashion, and a sterile drape was applied covering the operative field. A sterile gown and sterile gloves were used for the procedure.  Under CT guidance, an 18 gauge needle was inserted into the gallbladder fossa abscess. It was removed over an Amplatz. A 10 French dilator followed by a 10 [place] drain were inserted. Three additional sideholes were cut because of the size of the abscess. It was string fixed and sewn to the skin. Pus was aspirated.  FINDINGS: Images document drain placement into a gallbladder fossa abscess.  COMPLICATIONS: None  IMPRESSION: Successful gallbladder fossa abscess  drain.   Electronically Signed   By: Marybelle Killings M.D.   On: 05/24/2015 16:33    Labs:  CBC:  Recent Labs  04/01/15 1443 05/23/15 1135 05/24/15 0603 05/26/15 0510  WBC 9.0 20.0* 19.6* 12.5*  HGB 12.3* 10.2* 10.1* 9.3*  HCT 37.8* 31.8* 31.1* 29.6*  PLT 361.0 545* 414* 401*    COAGS:  Recent Labs  02/15/15 0731 05/23/15 1135  INR 0.9 1.25    BMP:  Recent Labs  03/02/15 0528 03/02/15 2056 05/23/15 1135 05/24/15 0603 05/26/15 0510  NA 140  --  136 137 137  K 4.3  --  3.7 3.7 3.8  CL 108  --  102 106 104  CO2 25  --  26 23 28   GLUCOSE 133*  --  145* 141* 124*  BUN 16  --  33* 27* 22*  CALCIUM 8.4*  --  9.3 8.8* 8.7*  CREATININE 1.48* 1.31* 1.67* 1.33* 1.22  GFRNONAA 46* 53* 39* 51* 57*  GFRAA 53* >60 45* 60* >60    LIVER FUNCTION TESTS:  Recent Labs  04/01/15 1443 05/23/15 1135 05/24/15 0603 05/26/15 0510  BILITOT 0.4 0.8 0.9 0.2*  AST 14 56* 50* 26  ALT 9 45 40 30  ALKPHOS 44 71 68 64  PROT 7.5 7.5 6.7 5.9*  ALBUMIN 4.1 2.9* 2.6* 2.3*    Assessment and Plan: Status post gallbladder fossa/hepatic abscess drainage 8/9; afebrile; no new labs today; abscess cx's- viridans strept; plan for f/u CT abd with IV contrast  on 8/14 per GI as long as creat ok; other plans as per ID    Signed: D. Rowe Robert 05/27/2015, 3:38 PM   I spent a total of 15 minutes at the the patient's bedside  on the patient's hospital floor or unit, greater than 50% of which was counseling/coordinating care for gallbladder fossa/hepatic abscess

## 2015-05-28 LAB — CULTURE, BLOOD (ROUTINE X 2)
Culture: NO GROWTH
Culture: NO GROWTH

## 2015-05-28 LAB — BASIC METABOLIC PANEL
Anion gap: 6 (ref 5–15)
BUN: 14 mg/dL (ref 6–20)
CO2: 29 mmol/L (ref 22–32)
Calcium: 8.9 mg/dL (ref 8.9–10.3)
Chloride: 106 mmol/L (ref 101–111)
Creatinine, Ser: 1.1 mg/dL (ref 0.61–1.24)
GFR calc Af Amer: >60 mL/min (ref >60)
GFR calc non Af Amer: >60 mL/min (ref >60)
Glucose, Bld: 154 mg/dL — ABNORMAL HIGH (ref 65–99)
Potassium: 4 mmol/L (ref 3.5–5.1)
Sodium: 141 mmol/L (ref 135–145)

## 2015-05-28 NOTE — Progress Notes (Signed)
Progress Note for [place]  Subjective: Drenching night sweats last evening.  Objective: Vital signs in last 24 hours: Temp:  [97.8 F (36.6 C)-99.6 F (37.6 C)] 97.8 F (36.6 C) (08/13 0700) Pulse Rate:  [61-75] 75 (08/13 0700) Resp:  [18] 18 (08/13 0700) BP: (154-179)/(60-74) 175/74 mmHg (08/13 0700) SpO2:  [95 %-98 %] 98 % (08/13 0700) Last BM Date: 05/23/15  Intake/Output from previous day: 08/12 0701 - 08/13 0700 In: 1410 [P.O.:960; I.V.:240; IV Piggyback:200] Out: 25 [Drains:25] Intake/Output this shift:    General appearance: alert and no distress GI: soft, non-tender; bowel sounds normal; no masses,  no organomegaly  Lab Results:  Recent Labs  05/26/15 0510  WBC 12.5*  HGB 9.3*  HCT 29.6*  PLT 401*   BMET  Recent Labs  05/26/15 0510 05/28/15 0415  NA 137 141  K 3.8 4.0  CL 104 106  CO2 28 29  GLUCOSE 124* 154*  BUN 22* 14  CREATININE 1.22 1.10  CALCIUM 8.7* 8.9   LFT  Recent Labs  05/26/15 0510  PROT 5.9*  ALBUMIN 2.3*  AST 26  ALT 30  ALKPHOS 64  BILITOT 0.2*   PT/INR No results for input(s): LABPROT, INR in the last 72 hours. Hepatitis Panel No results for input(s): HEPBSAG, HCVAB, HEPAIGM, HEPBIGM in the last 72 hours. C-Diff No results for input(s): CDIFFTOX in the last 72 hours. Fecal Lactopherrin No results for input(s): FECLLACTOFRN in the last 72 hours.  Studies/Results: No results found.  Medications:  Scheduled: . cefTRIAXone (ROCEPHIN)  IV  2 g Intravenous Q24H  . docusate sodium  100 mg Oral QHS  . polyethylene glycol  17 g Oral QHS   Continuous:   Assessment/Plan: 1) Liver abscess s/p ERCP - S. Viridans. 2) Night sweats.   The patient is stable.  No documented fever and he feels well.  No drainage from the abscess drain.  Plan: 1) Continue with CTX. 2) Await CT scan tomorrow.   LOS: 5 days   Tobey Schmelzle D 05/28/2015, 7:28 AM

## 2015-05-28 NOTE — Progress Notes (Signed)
Referring Physician(s): Carlean Purl  Chief Complaint:  GB fossa abscess  Subjective:  Drain placed 8/9 Output slowing For reCT in am   Allergies: Ezetimibe and Statins  Medications: Prior to Admission medications   Medication Sig Start Date End Date Taking? Authorizing Provider  acetaminophen (TYLENOL) 500 MG tablet Take 1,500 mg by mouth 2 (two) times daily.    Yes Historical Provider, MD  benazepril (LOTENSIN) 40 MG tablet Take 1 tablet (40 mg total) by mouth daily. 03/10/15  Yes Biagio Borg, MD  fenofibrate 160 MG tablet Take 1 tablet (160 mg total) by mouth at bedtime. 03/10/15  Yes Biagio Borg, MD  sodium chloride (OCEAN) 0.65 % nasal spray Place 1 spray into the nose daily as needed for congestion.    Yes Historical Provider, MD  terazosin (HYTRIN) 2 MG capsule Take 1 capsule (2 mg total) by mouth once. Patient taking differently: Take 2 mg by mouth daily.  03/10/15  Yes Biagio Borg, MD     Vital Signs: BP 175/74 mmHg  Pulse 75  Temp(Src) 97.8 F (36.6 C) (Oral)  Resp 18  Ht 5' 7.24" (1.708 m)  Wt 153 lb 14.1 oz (69.8 kg)  BMI 23.93 kg/m2  SpO2 98%  Physical Exam  Abdominal: Soft. Bowel sounds are normal. There is no tenderness.  Site of drain is clean and dry NT No bleeding Output 25 cc yesterday Minimal in JP Cloudy fluid Cx strept viridans   Skin: Skin is warm and dry.  Nursing note and vitals reviewed.   Imaging: Ct Image Guided Drainage Percut Cath  Peritoneal Retroperit  05/24/2015   CLINICAL DATA:  Gallbladder fossa abscess  EXAM: CT-GUIDED GALLBLADDER FOSSA ABSCESS DRAINAGE  FLUOROSCOPY TIME:  None  MEDICATIONS AND MEDICAL HISTORY: Versed 3 mg, Fentanyl 50 mcg.  Additional Medications: None.  ANESTHESIA/SEDATION: Moderate sedation time: 20 minutes  CONTRAST:  None  PROCEDURE: The procedure, risks, benefits, and alternatives were explained to the patient. Questions regarding the procedure were encouraged and answered. The patient understands and  consents to the procedure.  The right upper quadrant was prepped with Betadine in a sterile fashion, and a sterile drape was applied covering the operative field. A sterile gown and sterile gloves were used for the procedure.  Under CT guidance, an 18 gauge needle was inserted into the gallbladder fossa abscess. It was removed over an Amplatz. A 10 French dilator followed by a 10 [place] drain were inserted. Three additional sideholes were cut because of the size of the abscess. It was string fixed and sewn to the skin. Pus was aspirated.  FINDINGS: Images document drain placement into a gallbladder fossa abscess.  COMPLICATIONS: None  IMPRESSION: Successful gallbladder fossa abscess drain.   Electronically Signed   By: Marybelle Killings M.D.   On: 05/24/2015 16:33    Labs:  CBC:  Recent Labs  04/01/15 1443 05/23/15 1135 05/24/15 0603 05/26/15 0510  WBC 9.0 20.0* 19.6* 12.5*  HGB 12.3* 10.2* 10.1* 9.3*  HCT 37.8* 31.8* 31.1* 29.6*  PLT 361.0 545* 414* 401*    COAGS:  Recent Labs  02/15/15 0731 05/23/15 1135  INR 0.9 1.25    BMP:  Recent Labs  05/23/15 1135 05/24/15 0603 05/26/15 0510 05/28/15 0415  NA 136 137 137 141  K 3.7 3.7 3.8 4.0  CL 102 106 104 106  CO2 26 23 28 29   GLUCOSE 145* 141* 124* 154*  BUN 33* 27* 22* 14  CALCIUM 9.3 8.8* 8.7* 8.9  CREATININE 1.67* 1.33* 1.22 1.10  GFRNONAA 39* 51* 57* >60  GFRAA 45* 60* >60 >60    LIVER FUNCTION TESTS:  Recent Labs  04/01/15 1443 05/23/15 1135 05/24/15 0603 05/26/15 0510  BILITOT 0.4 0.8 0.9 0.2*  AST 14 56* 50* 26  ALT 9 45 40 30  ALKPHOS 44 71 68 64  PROT 7.5 7.5 6.7 5.9*  ALBUMIN 4.1 2.9* 2.6* 2.3*    Assessment and Plan:  GB fossa abscess drain intact Will follow For reCT in am   Signed: Jonty Morrical A 05/28/2015, 9:54 AM   I spent a total of 15 Minutes at the the patient's bedside AND on the patient's hospital floor or unit, greater than 50% of which was counseling/coordinating care for  drain

## 2015-05-29 ENCOUNTER — Inpatient Hospital Stay (HOSPITAL_COMMUNITY): Payer: Medicare Other

## 2015-05-29 MED ORDER — BENAZEPRIL HCL 40 MG PO TABS
40.0000 mg | ORAL_TABLET | Freq: Every day | ORAL | Status: DC
  Administered 2015-05-29 – 2015-05-30 (×2): 40 mg via ORAL
  Filled 2015-05-29 (×2): qty 1

## 2015-05-29 MED ORDER — IOHEXOL 300 MG/ML  SOLN
100.0000 mL | Freq: Once | INTRAMUSCULAR | Status: AC | PRN
  Administered 2015-05-29: 100 mL via INTRAVENOUS

## 2015-05-29 NOTE — Progress Notes (Signed)
Subjective: Feeling well.  No complaints.  Objective: Vital signs in last 24 hours: Temp:  [98.2 F (36.8 C)-98.5 F (36.9 C)] 98.5 F (36.9 C) (08/14 0631) Pulse Rate:  [58-77] 64 (08/14 0631) Resp:  [16-18] 18 (08/14 0631) BP: (150-169)/(49-71) 157/64 mmHg (08/14 0631) SpO2:  [95 %-98 %] 98 % (08/14 0631) Last BM Date: 05/28/15  Intake/Output from previous day: 08/13 0701 - 08/14 0700 In: 1555 [P.O.:1440; IV Piggyback:100] Out: 20 [Drains:20] Intake/Output this shift:    General appearance: alert and no distress GI: soft, non-tender; bowel sounds normal; no masses,  no organomegaly  Lab Results: No results for input(s): WBC, HGB, HCT, PLT in the last 72 hours. BMET  Recent Labs  05/28/15 0415  NA 141  K 4.0  CL 106  CO2 29  GLUCOSE 154*  BUN 14  CREATININE 1.10  CALCIUM 8.9   LFT No results for input(s): PROT, ALBUMIN, AST, ALT, ALKPHOS, BILITOT, BILIDIR, IBILI in the last 72 hours. PT/INR No results for input(s): LABPROT, INR in the last 72 hours. Hepatitis Panel No results for input(s): HEPBSAG, HCVAB, HEPAIGM, HEPBIGM in the last 72 hours. C-Diff No results for input(s): CDIFFTOX in the last 72 hours. Fecal Lactopherrin No results for input(s): FECLLACTOFRN in the last 72 hours.  Studies/Results: No results found.  Medications:  Scheduled: . cefTRIAXone (ROCEPHIN)  IV  2 g Intravenous Q24H  . docusate sodium  100 mg Oral QHS  . polyethylene glycol  17 g Oral QHS   Continuous:   Assessment/Plan: 1) Liver abscess s/p ERCP. 2) Abscess drain.   The drain reveals 1-2 ml of straw colored fluid.  He estimates that a total of 5 ml was drained over the past 24 hours.    Plan: 1) Await CT scan this morning.    LOS: 6 days   Milany Geck D 05/29/2015, 8:38 AM

## 2015-05-30 ENCOUNTER — Other Ambulatory Visit: Payer: Self-pay

## 2015-05-30 ENCOUNTER — Other Ambulatory Visit: Payer: Self-pay | Admitting: Radiology

## 2015-05-30 DIAGNOSIS — K750 Abscess of liver: Secondary | ICD-10-CM

## 2015-05-30 MED ORDER — HEPARIN SOD (PORK) LOCK FLUSH 100 UNIT/ML IV SOLN
250.0000 [IU] | INTRAVENOUS | Status: AC | PRN
  Administered 2015-05-30: 250 [IU]

## 2015-05-30 MED ORDER — HYDROCODONE-ACETAMINOPHEN 5-325 MG PO TABS: 1.0000 | ORAL_TABLET | ORAL | Status: DC | PRN

## 2015-05-30 MED ORDER — CEFTRIAXONE SODIUM 2 G IJ SOLR: 2.0000 g | INTRAMUSCULAR | Status: AC

## 2015-05-30 MED ORDER — POLYETHYLENE GLYCOL 3350 17 G PO PACK: 17.0000 g | PACK | Freq: Every day | ORAL | Status: DC

## 2015-05-30 MED ORDER — ONDANSETRON HCL 4 MG PO TABS: 4.0000 mg | ORAL_TABLET | Freq: Four times a day (QID) | ORAL | Status: DC | PRN

## 2015-05-30 NOTE — Progress Notes (Signed)
Referring Physician(s): GI  Chief Complaint: Hepatic abscess s/p perc drain 8/9  Subjective: Patient denies any abdominal pain, fever chills or nausea. He is tolerating a regular diet without difficulty. He is anxious regarding discharge plans  Allergies: Ezetimibe and Statins  Medications: Prior to Admission medications   Medication Sig Start Date End Date Taking? Authorizing Provider  acetaminophen (TYLENOL) 500 MG tablet Take 1,500 mg by mouth 2 (two) times daily.    Yes Historical Provider, MD  benazepril (LOTENSIN) 40 MG tablet Take 1 tablet (40 mg total) by mouth daily. 03/10/15  Yes Biagio Borg, MD  fenofibrate 160 MG tablet Take 1 tablet (160 mg total) by mouth at bedtime. 03/10/15  Yes Biagio Borg, MD  sodium chloride (OCEAN) 0.65 % nasal spray Place 1 spray into the nose daily as needed for congestion.    Yes Historical Provider, MD  terazosin (HYTRIN) 2 MG capsule Take 1 capsule (2 mg total) by mouth once. Patient taking differently: Take 2 mg by mouth daily.  03/10/15  Yes Biagio Borg, MD    Vital Signs: BP 165/80 mmHg  Pulse 65  Temp(Src) 98.7 F (37.1 C) (Oral)  Resp 18  Ht 5' 7.24" (1.708 m)  Wt 153 lb 14.1 oz (69.8 kg)  BMI 23.93 kg/m2  SpO2 97%  Physical Exam General: Anxious Abd: Soft, NT, ND, RUQ drain intact, NT, fluid 10cc in bulb and 10cc/24 hrs recorded.   Imaging: Ct Abdomen W Contrast  05/29/2015   CLINICAL DATA:  Follow-up gallbladder fossa abscess  EXAM: CT ABDOMEN WITH CONTRAST  TECHNIQUE: Multidetector CT imaging of the abdomen was performed using the standard protocol following bolus administration of intravenous contrast.  CONTRAST:  138mL OMNIPAQUE IOHEXOL 300 MG/ML  SOLN  COMPARISON:  05/24/2015  FINDINGS: Lung bases shows bilateral basilar atelectasis. Sagittal images of the spine shows degenerative changes thoracolumbar spine. Atherosclerotic plaques and calcifications are noted within abdominal aorta. Significant mural  thrombosis/plaque noted in distal abdominal aorta see axial image 44. Atherosclerotic plaques and calcifications are noted bilateral common iliac arteries.  There is a drain in right upper quadrant. The abscess in gallbladder fossa measures 6 x 5.2 cm decreased in size from prior exam when measured 7.4 by 7.5 cm. Small air bubbles are noted within abscess. Stable pneumobilia in left hepatic lobe. No new abscess is noted.  Atherosclerotic calcifications of coronary arteries. The pancreas, spleen and adrenal glands are unremarkable. Kidneys are symmetrical in size and enhancement. No hydronephrosis or hydroureter.  Moderate stool noted throughout the colon. There is no small bowel obstruction. No mesenteric abscess is noted. No free abdominal air.  IMPRESSION: 1. There is a drain in right upper quadrant. Residual abscess in gallbladder fossa decreased in size from prior exam measures 6 x 5.2 cm. On the prior exam measures 7.4 x 7.5 cm. Small air bubbles are noted within abscess. No new abscess is noted. 2. Stable pneumobilia in left hepatic lobe. 3. Atherosclerotic calcifications of abdominal aorta and bilateral common iliac arteries. 4. No hydronephrosis or hydroureter.   Electronically Signed   By: Lahoma Crocker M.D.   On: 05/29/2015 10:42    Labs:  CBC:  Recent Labs  04/01/15 1443 05/23/15 1135 05/24/15 0603 05/26/15 0510  WBC 9.0 20.0* 19.6* 12.5*  HGB 12.3* 10.2* 10.1* 9.3*  HCT 37.8* 31.8* 31.1* 29.6*  PLT 361.0 545* 414* 401*    COAGS:  Recent Labs  02/15/15 0731 05/23/15 1135  INR 0.9 1.25  BMP:  Recent Labs  05/23/15 1135 05/24/15 0603 05/26/15 0510 05/28/15 0415  NA 136 137 137 141  K 3.7 3.7 3.8 4.0  CL 102 106 104 106  CO2 26 23 28 29   GLUCOSE 145* 141* 124* 154*  BUN 33* 27* 22* 14  CALCIUM 9.3 8.8* 8.7* 8.9  CREATININE 1.67* 1.33* 1.22 1.10  GFRNONAA 39* 51* 57* >60  GFRAA 45* 60* >60 >60    LIVER FUNCTION TESTS:  Recent Labs  04/01/15 1443  05/23/15 1135 05/24/15 0603 05/26/15 0510  BILITOT 0.4 0.8 0.9 0.2*  AST 14 56* 50* 26  ALT 9 45 40 30  ALKPHOS 44 71 68 64  PROT 7.5 7.5 6.7 5.9*  ALBUMIN 4.1 2.9* 2.6* 2.3*    Assessment and Plan: Hepatic abscess S/p perc drain placed 05/24/15, repeat CT 8/14 with improved but still residual abscess, wbc trending down 12 (19), output slight turbid transitioning to serosang, Cx Viridans, afebrile Would recommend BID flushing with 81ml sterile saline, monitor output  F/U in IR drain clinic in 2 weeks, (week of august 29th) for CT abdomen with IV contrast- Our clinic will call patient with appointment information [phone], this has all been discussed with Dr. Deatra Ina today Plans per GI ID on board and patient will have continued IV antibiotics for 3 weeks   Signed: Hedy Jacob 05/30/2015, 9:45 AM   I spent a total of 15 Minutes at the the patient's bedside AND on the patient's hospital floor or unit, greater than 50% of which was counseling/coordinating care for hepatic abscess.

## 2015-05-30 NOTE — Progress Notes (Signed)
Pt is very anxious.  Repeat CT demonstrates persistent but smaller hepatic abscess.  Drainage only 10cc yesterday. Await assessment from IR whether pt can be d/ced home today.

## 2015-05-30 NOTE — Care Management Important Message (Signed)
Important Message  Patient Details  Name: Neil [name] MRN: [id number] Date of Birth: [birth year]   Medicare Important Message Given:  Yes-third notification given    Shelda Altes 05/30/2015, 4:10 PMImportant Message  Patient Details  Name: Neil [name] MRN: [id number] Date of Birth: 13-Mar-1941   Medicare Important Message Given:  Yes-third notification given    Shelda Altes 05/30/2015, 4:08 PM

## 2015-05-30 NOTE — Progress Notes (Signed)
[facility] Progress Note  Subjective:    Repeat CT demonstrates persistent but smaller hepatic abscess. Drainage only 10cc yesterday.Patient denies any abdominal pain, fever chills or nausea. He is tolerating a regular diet without difficulty.Extremely anxious, but wants to go home. Has a multitude of questions as to what time [facility] will be at his house, etc.   Objective:  Vital signs in last 24 hours: Temp:  [98.7 F (37.1 C)-99 F (37.2 C)] 98.7 F (37.1 C) (08/15 0659) Pulse Rate:  [58-65] 65 (08/15 0659) Resp:  [18] 18 (08/15 0659) BP: (152-180)/(63-80) 165/80 mmHg (08/15 0659) SpO2:  [97 %] 97 % (08/15 0659) Last BM Date: 05/30/15 General:   Alert,  Well-developed,    in NAD Heart:  Regular rate and rhythm; no murmurs Pulm;lungs clear Abdomen:  Soft, nontender and nondistended. RUQ drain in place,  Normal bowel sounds, without guarding, and without rebound.   Extremities:  Without edema. Neurologic:  Alert and  oriented x4;  grossly normal neurologically. Psych:  Alert and cooperative. Normal mood and affect.  Intake/Output from previous day: 08/14 0701 - 08/15 0700 In: 10  Out: 10 [Drains:10] Intake/Output this shift:    Lab Results: No results for input(s): WBC, HGB, HCT, PLT in the last 72 hours. BMET  Recent Labs  05/28/15 0415  NA 141  K 4.0  CL 106  CO2 29  GLUCOSE 154*  BUN 14  CREATININE 1.10  CALCIUM 8.9    Ct Abdomen W Contrast  05/29/2015   CLINICAL DATA:  Follow-up gallbladder fossa abscess  EXAM: CT ABDOMEN WITH CONTRAST  TECHNIQUE: Multidetector CT imaging of the abdomen was performed using the standard protocol following bolus administration of intravenous contrast.  CONTRAST:  176mL OMNIPAQUE IOHEXOL 300 MG/ML  SOLN  COMPARISON:  05/24/2015  FINDINGS: Lung bases shows bilateral basilar atelectasis. Sagittal images of the spine shows degenerative changes thoracolumbar spine. Atherosclerotic plaques and  calcifications are noted within abdominal aorta. Significant mural thrombosis/plaque noted in distal abdominal aorta see axial image 44. Atherosclerotic plaques and calcifications are noted bilateral common iliac arteries.  There is a drain in right upper quadrant. The abscess in gallbladder fossa measures 6 x 5.2 cm decreased in size from prior exam when measured 7.4 by 7.5 cm. Small air bubbles are noted within abscess. Stable pneumobilia in left hepatic lobe. No new abscess is noted.  Atherosclerotic calcifications of coronary arteries. The pancreas, spleen and adrenal glands are unremarkable. Kidneys are symmetrical in size and enhancement. No hydronephrosis or hydroureter.  Moderate stool noted throughout the colon. There is no small bowel obstruction. No mesenteric abscess is noted. No free abdominal air.  IMPRESSION: 1. There is a drain in right upper quadrant. Residual abscess in gallbladder fossa decreased in size from prior exam measures 6 x 5.2 cm. On the prior exam measures 7.4 x 7.5 cm. Small air bubbles are noted within abscess. No new abscess is noted. 2. Stable pneumobilia in left hepatic lobe. 3. Atherosclerotic calcifications of abdominal aorta and bilateral common iliac arteries. 4. No hydronephrosis or hydroureter.   Electronically Signed   By: Lahoma Crocker M.D.   On: 05/29/2015 10:42    ASSESSMENT/PLAN:   74 yo male with hepatic abscess. WBC trending down. Cx with s viridans. Pt stable for d/c from GI standpoint. Will need LFTs and cbc an a week--office will order for pt.Pt to have continued abx for 3 more weeks.Will sched f/u in GI office as well.Spoke with [facility]--pt  to start home antibiotics (IV) tomorrow.Pt will also have  [facility] nurse instruct him on flushing of drain with sterile saline.Pt will get rocephin dose today before d/c and will get his next dose of rocephin tomorrow at home.     LOS: 7 days   Brevon Dewald, Deloris Ping 05/30/2015, Pager [phone]

## 2015-05-30 NOTE — Progress Notes (Signed)
Patient is alert and oriented, vital signs are stable, incisions are within normal limits, J.P. drain flushed as ordered, dressing changed, discharge instructions reviewed with patient and patient to follow up with GI doctor and IR, [facility] set to give IV antibiotics Neta Mends RN 4:13 PM 05-30-2015

## 2015-05-30 NOTE — Discharge Summary (Signed)
[name] Discharge Summary  Name: Neil [name] MRN: [id number] DOB: 06-Sep-1941 74 y.o. PCP:  [name], MD  Date of Admission: 05/23/2015  4:15 PM Date of Discharge: 05/30/2015 Primary Gastroenterologist:[name] Discharging Physician: Dr. Deatra Ina  Discharge Diagnosis: Active Problems:   Liver abscess   Normocytic anemia   Acute kidney injury   Essential hypertension, benign   Abscess, hepatic   Malnutrition of moderate degree   Streptococcal infection   Loss of appetite   Consultations:Interventional radiology                           Infectious Disease  Procedures Performed:  Ct Abdomen W Contrast  05/29/2015   CLINICAL DATA:  Follow-up gallbladder fossa abscess  EXAM: CT ABDOMEN WITH CONTRAST  TECHNIQUE: Multidetector CT imaging of the abdomen was performed using the standard protocol following bolus administration of intravenous contrast.  CONTRAST:  166mL OMNIPAQUE IOHEXOL 300 MG/ML  SOLN  COMPARISON:  05/24/2015  FINDINGS: Lung bases shows bilateral basilar atelectasis. Sagittal images of the spine shows degenerative changes thoracolumbar spine. Atherosclerotic plaques and calcifications are noted within abdominal aorta. Significant mural thrombosis/plaque noted in distal abdominal aorta see axial image 44. Atherosclerotic plaques and calcifications are noted bilateral common iliac arteries.  There is a drain in right upper quadrant. The abscess in gallbladder fossa measures 6 x 5.2 cm decreased in size from prior exam when measured 7.4 by 7.5 cm. Small air bubbles are noted within abscess. Stable pneumobilia in left hepatic lobe. No new abscess is noted.  Atherosclerotic calcifications of coronary arteries. The pancreas, spleen and adrenal glands are unremarkable. Kidneys are symmetrical in size and enhancement. No hydronephrosis or hydroureter.  Moderate stool noted throughout the colon. There is no small bowel obstruction. No mesenteric abscess is noted. No free  abdominal air.  IMPRESSION: 1. There is a drain in right upper quadrant. Residual abscess in gallbladder fossa decreased in size from prior exam measures 6 x 5.2 cm. On the prior exam measures 7.4 x 7.5 cm. Small air bubbles are noted within abscess. No new abscess is noted. 2. Stable pneumobilia in left hepatic lobe. 3. Atherosclerotic calcifications of abdominal aorta and bilateral common iliac arteries. 4. No hydronephrosis or hydroureter.   Electronically Signed   By: Lahoma Crocker M.D.   On: 05/29/2015 10:42   Mr 3d Recon At Scanner  05/24/2015   CLINICAL DATA:  74 year old male inpatient with a history of cholecystectomy on 03/02/15 requiring biliary stent placement and subsequent removal on 05/03/15, presents with fever, shaking chills, sweats, weakness and upper abdominal pain for 2 weeks.  EXAM: MRI ABDOMEN WITHOUT AND WITH CONTRAST (INCLUDING MRCP)  TECHNIQUE: Multiplanar multisequence MR imaging of the abdomen was performed both before and after the administration of intravenous contrast. Heavily T2-weighted images of the biliary and pancreatic ducts were obtained, and three-dimensional MRCP images were rendered by post processing.  CONTRAST:  59mL MULTIHANCE GADOBENATE DIMEGLUMINE 529 MG/ML IV SOLN  COMPARISON:  02/11/2015 CT abdomen/pelvis. 04/08/2015 abdominal sonogram.  FINDINGS: Lower chest:  There is mild bibasilar atelectasis.  Hepatobiliary: There is a complex multilocular fluid collection centered within segment 4B of the liver measuring 8.1 x 7.9 x 10.2 cm, which is new since the 04/08/15 ultrasound and demonstrates a thick enhancing wall, thick enhancing internal septations and internal restricted diffusion, in keeping with a liver abscess. No additional liver fluid collections or masses. There is no convincing evidence of background hepatic steatosis on chemical  shift imaging. The gallbladder is surgically absent. There is mild diffuse intrahepatic biliary ductal dilatation, similar to the  02/11/2015 CT study. The common bile duct is mildly to moderately dilated, measuring 9 mm in diameter. There are a few scattered low signal defects in the nondependent portion of the common bile duct and within the nondependent intrahepatic biliary tree in the left liver lobe, likely representing pneumobilia. There is enhancement of the wall of the common bile duct, suggesting cholangitis. There is smooth narrowing of the common bile duct at the porta hepatis, likely due to mass effect from the adjacent liver abscess. No round filling defects are seen within the biliary tree to suggest choledocholithiasis.  Pancreas: Normal pancreas, with no pancreatic mass, no main pancreatic duct dilation and no pancreatic or peripancreatic fluid collections. No definite evidence of pancreas divisum, although assessment for divisum is limited by motion artifact.  Spleen: Normal.  Adrenals/Urinary Tract: Normal adrenals. Normal kidneys, with no renal mass and no hydronephrosis.  Stomach/Bowel: Collapsed and grossly normal stomach. Visualized small and large bowel are normal caliber.  Vascular/Lymphatic: There is a stable penetrating atherosclerotic ulcer his measuring 1.5 x 1.1 cm in the right posterior infrarenal abdominal aortic wall (series 1105/ image 74). Patent portal, splenic, hepatic and renal veins. Retroaortic left renal vein. No abdominal lymphadenopathy.  Other: No ascites.  Musculoskeletal: No gross suspicious focal osseous lesion.  IMPRESSION: 1. New 10.2 cm multilocular liver abscess centered in segment 4B. 2. Enhancing common bile duct wall, suggesting cholangitis. 3. Pneumobilia, likely due to a history of sphincterotomy. 4. Smooth narrowing of the common bile duct at the porta hepatis, likely due to mass effect from the adjacent liver abscess. No appreciable choledocholithiasis. 5. 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  These results were called by telephone at the time  of interpretation on 05/24/2015 at 8:16 am to Dr. Nicoletta Ba , who verbally acknowledged these results.   Electronically Signed   By: Ilona Sorrel M.D.   On: 05/24/2015 08:28   Dg Ercp  05/03/2015   CLINICAL DATA:  Biliary stent removal.  EXAM: ERCP .  TECHNIQUE: Multiple spot images obtained with the fluoroscopic device and submitted for interpretation post-procedure.  FLUOROSCOPY TIME:  Fluoroscopy Time:  6 minutes 42 seconds.  Number of Acquired Images:  21.  COMPARISON:  Ultrasound of April 08, 2015.  FINDINGS: Multiple fluoroscopic images were obtained during ERCP. Initial image demonstrates biliary stent in place. Later images demonstrate this to have been removed. Cannulation of common bile duct is noted with contrast injected. This demonstrates multiple round filling defects consistent with stones in the dilated common bile duct. Balloon catheter was passed through the duct. No residual stones are noted on follow-up imaging.  IMPRESSION: Biliary stent was removed. Balloon catheter was passed through common bile duct to remove multiple common bile duct stones.  These images were submitted for radiologic interpretation only. Please see the procedural report for the amount of contrast and the fluoroscopy time utilized.   Electronically Signed   By: Marijo Conception, M.D.   On: 05/03/2015 12:19   Mr [name] W/wo Cm/mrcp  05/24/2015   CLINICAL DATA:  74 year old male inpatient with a history of cholecystectomy on 03/02/15 requiring biliary stent placement and subsequent removal on 05/03/15, presents with fever, shaking chills, sweats, weakness and upper abdominal pain for 2 weeks.  EXAM: MRI ABDOMEN WITHOUT AND WITH CONTRAST (INCLUDING MRCP)  TECHNIQUE: Multiplanar multisequence MR imaging of the abdomen was performed  both before and after the administration of intravenous contrast. Heavily T2-weighted images of the biliary and pancreatic ducts were obtained, and three-dimensional MRCP images were rendered by post  processing.  CONTRAST:  75mL MULTIHANCE GADOBENATE DIMEGLUMINE 529 MG/ML IV SOLN  COMPARISON:  02/11/2015 CT abdomen/pelvis. 04/08/2015 abdominal sonogram.  FINDINGS: Lower chest:  There is mild bibasilar atelectasis.  Hepatobiliary: There is a complex multilocular fluid collection centered within segment 4B of the liver measuring 8.1 x 7.9 x 10.2 cm, which is new since the 04/08/15 ultrasound and demonstrates a thick enhancing wall, thick enhancing internal septations and internal restricted diffusion, in keeping with a liver abscess. No additional liver fluid collections or masses. There is no convincing evidence of background hepatic steatosis on chemical shift imaging. The gallbladder is surgically absent. There is mild diffuse intrahepatic biliary ductal dilatation, similar to the 02/11/2015 CT study. The common bile duct is mildly to moderately dilated, measuring 9 mm in diameter. There are a few scattered low signal defects in the nondependent portion of the common bile duct and within the nondependent intrahepatic biliary tree in the left liver lobe, likely representing pneumobilia. There is enhancement of the wall of the common bile duct, suggesting cholangitis. There is smooth narrowing of the common bile duct at the porta hepatis, likely due to mass effect from the adjacent liver abscess. No round filling defects are seen within the biliary tree to suggest choledocholithiasis.  Pancreas: Normal pancreas, with no pancreatic mass, no main pancreatic duct dilation and no pancreatic or peripancreatic fluid collections. No definite evidence of pancreas divisum, although assessment for divisum is limited by motion artifact.  Spleen: Normal.  Adrenals/Urinary Tract: Normal adrenals. Normal kidneys, with no renal mass and no hydronephrosis.  Stomach/Bowel: Collapsed and grossly normal stomach. Visualized small and large bowel are normal caliber.  Vascular/Lymphatic: There is a stable penetrating atherosclerotic  ulcer his measuring 1.5 x 1.1 cm in the right posterior infrarenal abdominal aortic wall (series 1105/ image 74). Patent portal, splenic, hepatic and renal veins. Retroaortic left renal vein. No abdominal lymphadenopathy.  Other: No ascites.  Musculoskeletal: No gross suspicious focal osseous lesion.  IMPRESSION: 1. New 10.2 cm multilocular liver abscess centered in segment 4B. 2. Enhancing common bile duct wall, suggesting cholangitis. 3. Pneumobilia, likely due to a history of sphincterotomy. 4. Smooth narrowing of the common bile duct at the porta hepatis, likely due to mass effect from the adjacent liver abscess. No appreciable choledocholithiasis. 5. 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  These results were called by telephone at the time of interpretation on 05/24/2015 at 8:16 am to Dr. Nicoletta Ba , who verbally acknowledged these results.   Electronically Signed   By: Ilona Sorrel M.D.   On: 05/24/2015 08:28   Ct Image Guided Drainage Percut Cath  Peritoneal Retroperit  05/24/2015   CLINICAL DATA:  Gallbladder fossa abscess  EXAM: CT-GUIDED GALLBLADDER FOSSA ABSCESS DRAINAGE  FLUOROSCOPY TIME:  None  MEDICATIONS AND MEDICAL HISTORY: Versed 3 mg, Fentanyl 50 mcg.  Additional Medications: None.  ANESTHESIA/SEDATION: Moderate sedation time: 20 minutes  CONTRAST:  None  PROCEDURE: The procedure, risks, benefits, and alternatives were explained to the patient. Questions regarding the procedure were encouraged and answered. The patient understands and consents to the procedure.  The right upper quadrant was prepped with Betadine in a sterile fashion, and a sterile drape was applied covering the operative field. A sterile gown and sterile gloves were used for the procedure.  Under CT  guidance, an 18 gauge needle was inserted into the gallbladder fossa abscess. It was removed over an Amplatz. A 10 French dilator followed by a 10 [place] drain were inserted. Three  additional sideholes were cut because of the size of the abscess. It was string fixed and sewn to the skin. Pus was aspirated.  FINDINGS: Images document drain placement into a gallbladder fossa abscess.  COMPLICATIONS: None  IMPRESSION: Successful gallbladder fossa abscess drain.   Electronically Signed   By: Marybelle Killings M.D.   On: 05/24/2015 16:33    GI Procedures: none  History/Physical Exam:  See Admission H&P  Admission HPI: Neil [name] is a 74 y.o. male admitted today from the office with complaints of upper abdominal pain, fever, shaking chills and sweats over the past 72 hours and mild hypotension documented in the office with blood pressure of 90/50. Patient had undergone ERCP, sphincterotomy and removal of numerous common bile duct stones and then placement of a biliary stent because of residual stone debris on 03/01/2015. The following day he underwent laparoscopic cholecystectomy per Dr. Dalbert Batman. He did well was discharged to home. He was seen back in the office on June 17. He says he did well for about a week after surgery and then developed some right upper quadrant pain which she had been noticing primarily in the evenings while sitting in his recliner. He also said he was having some discomfort lying on his right side at night. Labs were done and hepatic panel was unremarkable. KUB showed his stent to be in position as did upper abdominal ultrasound. He was then scheduled for ERCP and stent removal with [name] on July 19. At ERCP he was found to have multiple common bile duct stones measuring between 5 and 10 mm extracted with multiple balloon sweeps, stent was removed. Post extraction occlusion cholangiogram showed no residual filling defects and excellent drainage. Patient was discharged to home. He says he kept trying to convince himself that he was feeling better but really has not felt well since then and has been significantly worse over the past week or so. He says this past  weekend his symptoms "jumped all over him". In feeling poorly in general and hadn't had much appetite and had developed some mild recurrent right upper quadrant pain. Now over the past 72 hours or so he has had recurrent fevers shaking chills and sweats. He said he had very dark urine on Saturday 86 and has not noticed his urine as dark since then. He has not been eating has no appetite says he tries to eat and then becomes nauseated but has not vomited. He feels weak. Patient otherwise in fairly good health does have history of hypertension and BPH. He has been taking his medications.  Hospital Course by problem list:  GI service for IV fluids, blood cultures, baseline labs and initially started on IV Zosyn. He had an MRCP on August 9: IMPRESSION: 1. New 10.2 cm multilocular liver abscess centered in segment 4B. 2. Enhancing common bile duct wall, suggesting cholangitis. 3. Pneumobilia, likely due to a history of sphincterotomy. 4. Smooth narrowing of the common bile duct at the porta hepatis, likely due to mass effect from the adjacent liver abscess. No appreciable choledocholithiasis. 5. 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 He was evaluated by interventional radiology and had a gallbladder fossa abscess drain placed on August 9. He was then evaluated by infectious disease who recommended PICC line placement with 3-4  weeks of IV antibiotics. He was on piptazo.. Cultures from the gallbladder fossa abscess grew moderate strep viridans and lactobacillus. Blood cultures were negative. Patient had a PICC line placed and was evaluated by [facility] to arrange home and a biotic therapy. He was changed to ceftriaxone 2 g IV daily. He had a repeat CT done on August 14 that showed: IMPRESSION: 1. There is a drain in right upper quadrant. Residual abscess in gallbladder fossa decreased in size from prior exam measures 6 x 5.2 cm. On the prior exam  measures 7.4 x 7.5 cm. Small air bubbles are noted within abscess. No new abscess is noted. 2. Stable pneumobilia in left hepatic lobe. 3. Atherosclerotic calcifications of abdominal aorta and bilateral common iliac arteries. 4. No hydronephrosis or hydroureter.  On the 15th he was evaluated by interventional radiology who felt the patient was stable for discharge as CT on 814 showed improved but still residual abscess with W BC trending down. Dr. Deatra Ina felt the patient was ready for discharge as well. Patient to be discharged home on ceftriaxone 2 g daily for 3 more weeks. [facility] will provide this service. [facility] we'll also instruct the patient how to flush his drain twice a day with 5 mL's of sterile saline and monitor the output. Patient is to follow up in IR drain clinic in 2 weeks for CT of abdomen with IV contrast. The IR drain clinic will call the patient with appointment information. The patient will also follow up in GI in 2-3 weeks, and the GI office will contact the patient with this appointment. The patient will be having weekly labs drawn through his PICC line and will have a CBC and hepatic function panel drawn by [facility] with results sent to GI in one week.    Discharge Vitals:  BP 156/68 mmHg  Pulse 68  Temp(Src) 98.7 F (37.1 C) (Oral)  Resp 18  Ht 5' 7.24" (1.708 m)  Wt 153 lb 14.1 oz (69.8 kg)  BMI 23.93 kg/m2  SpO2 97%  Physical Exam General: Alert, Well-developed, in NAD Heart: Regular rate and rhythm; no murmurs Pulm;lungs clear Abdomen: Soft, nontender and nondistended. RUQ drain in place, Normal bowel sounds, without guarding, and without rebound.  Extremities: Without edema. Neurologic: Alert and oriented x4; grossly normal neurologically. Psych: Alert and cooperative. Normal mood and affect   General:Discharge Labs: See [facility] orders.  Disposition and follow-up:   Mr.Neil [name] was  discharged from [facility] in stable condition.    Follow-up Appointments: Discharge Instructions    Call MD for:  redness, tenderness, or signs of infection (pain, swelling, bleeding, redness, odor or green/yellow discharge around incision site)    Complete by:  As directed      Call MD for:  severe or increased pain, loss or decreased feeling  in affected limb(s)    Complete by:  As directed      Call MD for:  temperature >100.5    Complete by:  As directed      Lifting restrictions    Complete by:  As directed   No lifting for over 5 lbs for 3 weeks     Resume previous diet    Complete by:  As directed            Discharge Medications:   Medication List    TAKE these medications        acetaminophen 500 MG tablet  Commonly known as:  TYLENOL  Take 1,500 mg by mouth 2 (two) times daily.     benazepril 40 MG tablet  Commonly known as:  LOTENSIN  Take 1 tablet (40 mg total) by mouth daily.     cefTRIAXone 2 g in dextrose 5 % 50 mL  Inject 2 g into the vein daily.     fenofibrate 160 MG tablet  Take 1 tablet (160 mg total) by mouth at bedtime.     HYDROcodone-acetaminophen 5-325 MG per tablet  Commonly known as:  NORCO/VICODIN  Take 1-2 tablets by mouth every 4 (four) hours as needed for moderate pain.     ondansetron 4 MG tablet  Commonly known as:  ZOFRAN  Take 1 tablet (4 mg total) by mouth every 6 (six) hours as needed for nausea.     polyethylene glycol packet  Commonly known as:  MIRALAX / GLYCOLAX  Take 17 g by mouth at bedtime.     sodium chloride 0.65 % nasal spray  Commonly known as:  OCEAN  Place 1 spray into the nose daily as needed for congestion.     terazosin 2 MG capsule  Commonly known as:  HYTRIN  Take 1 capsule (2 mg total) by mouth once.        Signed: Leshae Mcclay, Vita Barley PA-C 05/30/2015, 12:03 PM

## 2015-05-31 ENCOUNTER — Telehealth: Payer: Self-pay

## 2015-05-31 ENCOUNTER — Other Ambulatory Visit: Payer: Self-pay

## 2015-05-31 ENCOUNTER — Encounter: Payer: Self-pay | Admitting: Internal Medicine

## 2015-05-31 DIAGNOSIS — K750 Abscess of liver: Secondary | ICD-10-CM | POA: Diagnosis not present

## 2015-05-31 NOTE — Telephone Encounter (Signed)
-----   Message from Irene Shipper, MD sent at 05/31/2015  2:47 PM EDT ----- Please touch base with infectious disease physician Dr. Jerrilyn Cairo office (that's who generally primarily manages hepatic abscesses, not GI). She needs to see him back for follow-up, when she thinks it is appropriate. Please help arrange. Let me know when he is having that appointment. I need to see him sometime thereafter. Thank you. Convert to phone note [name]. ----- Message -----    From: Algernon Huxley, RN    Sent: 05/30/2015  12:43 PM      To: Irene Shipper, MD  [name],  This pt was discharged from the hospital on Monday. Pt needs follow-up with [name] in 2-3 weeks. Per Cecille Rubin pt is very unhappy and has lots of questions and wants to see [name]. Where would you like me to schedule this pt? Please advise.  Thanks, Vaughan Basta ----- Message -----    From: Vita Barley Hvozdovic, PA-C    Sent: 05/30/2015  10:27 AM      To: Algernon Huxley, RN  Vaughan Basta, Pt being discharged today. Needs cbc and hepatic function panel in a week. He will need a f/u appt--I will let you know when after I speak to Dr Deatra Ina (pt is a Dr Henrene Pastor pt).Please let pt kow where to go for labs.

## 2015-05-31 NOTE — Telephone Encounter (Signed)
amb referral entered for pt to see Dr. Baxter Flattery with ID.  Pt now has appt with Dr. Baxter Flattery and [name], see additional phone note.

## 2015-05-31 NOTE — Telephone Encounter (Signed)
Pt is scheduled to see Dr. Baxter Flattery with ID 06/21/15@10 :30am. [name] when do you want to see the pt? Please advise.

## 2015-06-01 DIAGNOSIS — K750 Abscess of liver: Secondary | ICD-10-CM | POA: Diagnosis not present

## 2015-06-01 NOTE — Telephone Encounter (Signed)
Add on to schedule for Tuesday, September 13 at 8:15 AM. Thank you. If someone cancels that day, please block spot.

## 2015-06-01 NOTE — Telephone Encounter (Signed)
Spoke with pt and he is aware of ID appt and appt with [name] 06/28/15@8 :15am.

## 2015-06-02 DIAGNOSIS — K750 Abscess of liver: Secondary | ICD-10-CM | POA: Diagnosis not present

## 2015-06-03 DIAGNOSIS — K750 Abscess of liver: Secondary | ICD-10-CM | POA: Diagnosis not present

## 2015-06-06 ENCOUNTER — Other Ambulatory Visit: Payer: Self-pay | Admitting: Interventional Radiology

## 2015-06-06 DIAGNOSIS — K750 Abscess of liver: Secondary | ICD-10-CM

## 2015-06-06 DIAGNOSIS — Z5181 Encounter for therapeutic drug level monitoring: Secondary | ICD-10-CM | POA: Diagnosis not present

## 2015-06-07 DIAGNOSIS — K750 Abscess of liver: Secondary | ICD-10-CM | POA: Diagnosis not present

## 2015-06-10 DIAGNOSIS — K750 Abscess of liver: Secondary | ICD-10-CM | POA: Diagnosis not present

## 2015-06-13 DIAGNOSIS — K750 Abscess of liver: Secondary | ICD-10-CM | POA: Diagnosis not present

## 2015-06-13 DIAGNOSIS — Z5181 Encounter for therapeutic drug level monitoring: Secondary | ICD-10-CM | POA: Diagnosis not present

## 2015-06-15 ENCOUNTER — Other Ambulatory Visit: Payer: Self-pay | Admitting: Internal Medicine

## 2015-06-15 ENCOUNTER — Ambulatory Visit
Admit: 2015-06-15 | Discharge: 2015-06-15 | Disposition: A | Discharge status: Home / Self Care | Payer: Medicare Other | Attending: Internal Medicine | Admitting: Internal Medicine

## 2015-06-15 ENCOUNTER — Other Ambulatory Visit: Payer: Medicare Other

## 2015-06-15 ENCOUNTER — Ambulatory Visit
Admit: 2015-06-15 | Discharge: 2015-06-15 | Disposition: A | Discharge status: Home / Self Care | Payer: Medicare Other | Attending: Interventional Radiology | Admitting: Interventional Radiology

## 2015-06-15 ENCOUNTER — Other Ambulatory Visit: Payer: Self-pay | Admitting: Interventional Radiology

## 2015-06-15 ENCOUNTER — Ambulatory Visit
Admission: RE | Admit: 2015-06-15 | Discharge: 2015-06-15 | Disposition: A | Discharge status: Home / Self Care | Payer: Medicare Other | Source: Ambulatory Visit | Attending: Interventional Radiology | Admitting: Interventional Radiology

## 2015-06-15 DIAGNOSIS — K750 Abscess of liver: Secondary | ICD-10-CM | POA: Insufficient documentation

## 2015-06-15 DIAGNOSIS — K811 Chronic cholecystitis: Secondary | ICD-10-CM | POA: Diagnosis not present

## 2015-06-15 MED ORDER — IOPAMIDOL (ISOVUE-300) INJECTION 61%
100.0000 mL | Freq: Once | INTRAVENOUS | Status: AC | PRN
  Administered 2015-06-15: 100 mL via INTRAVENOUS

## 2015-06-15 NOTE — Progress Notes (Signed)
Referring Physician(s): Dr. Baxter Flattery [name]  Chief Complaint: The patient is seen in follow up today s/p percutaneous hepatic abscess drain placed by IR 05/24/15  History of present illness: Neil [name] is a 74 year old male with history of chronic cholecystitis with cholelithiasis who is s/p ERCP, sphincterotomy, stone removal and biliary stent placed 03/01/15-since removed. He is s/p lap cholecystectomy on 03/02/15. He continued to have progressive RUQ abdominal pain, fever, nausea, and fatigue and was admitted to the hospital on 05/23/15. MRI 05/24/15 revealed GB fossa/hepatic fluid collection concerning for abscess. He underwent a percutaneous hepatic drain placement by IR on 05/24/15, Cx (+) Viridans, F/U CT imaging 8/14 with decrease in size of abscess. He was then subsequently discharged from the hospital on 05/30/15 with IV antibiotics.   He is being seen today in follow up with CT imaging. He states he has been flushing the drain once daily with approximately 10cc of clear yellow output daily. He denies any abdominal pain, fever or chills.   Past Medical History  Diagnosis Date  . ALLERGIC RHINITIS 12/04/2007  . BENIGN PROSTATIC HYPERTROPHY 06/05/2007  . BUNDLE BRANCH BLOCK, RIGHT 07/21/2009  . BURSITIS, RIGHT SHOULDER 11/06/2010  . COLONIC POLYPS, HX OF 06/05/2007    tubular adenoma also 02/19/2013  . DIVERTICULOSIS, COLON 07/21/2009  . ERECTILE DYSFUNCTION 06/05/2007  . HYPERLIPIDEMIA 06/05/2007  . HYPERTENSION 06/05/2007  . INGUINAL HERNIA, RIGHT, SMALL 11/06/2010  . SKIN LESION 06/03/2008  . DIABETES MELLITUS, TYPE II 12/04/2007    pt states not diabetic-no meds 02-05-13 PV  . Gallstones   . Liver abscess 05/23/2015    Past Surgical History  Procedure Laterality Date  . Colonoscopy    . Polypectomy    . Hemorrhoid surgery    . Finger surgery Left   . Tonsillectomy    . Inguinal hernia repair Right     8'14 repair  . Ercp N/A 03/01/2015    Procedure: ENDOSCOPIC RETROGRADE  CHOLANGIOPANCREATOGRAPHY (ERCP);  Surgeon: Irene Shipper, MD;  Location: [facility];  Service: Endoscopy;  Laterality: N/A;  Dr Dalbert Batman wants patient to stay overnight after ERCP  . Cholecystectomy N/A 03/02/2015    Procedure: LAPAROSCOPIC CHOLECYSTECTOMY;  Surgeon: Fanny Skates, MD;  Location: [facility];  Service: General;  Laterality: N/A;  . Ercp N/A 05/03/2015    Procedure: ENDOSCOPIC RETROGRADE CHOLANGIOPANCREATOGRAPHY (ERCP);  Surgeon: Irene Shipper, MD;  Location: [facility];  Service: Endoscopy;  Laterality: N/A;    Allergies: Ezetimibe and Statins  Medications: Prior to Admission medications   Medication Sig Start Date End Date Taking? Authorizing Provider  acetaminophen (TYLENOL) 500 MG tablet Take 1,500 mg by mouth 2 (two) times daily.     Historical Provider, MD  benazepril (LOTENSIN) 40 MG tablet Take 1 tablet (40 mg total) by mouth daily. 03/10/15   Biagio Borg, MD  cefTRIAXone 2 g in dextrose 5 % 50 mL Inject 2 g into the vein daily. 05/30/15 06/18/15  Lori P Hvozdovic, PA-C  fenofibrate 160 MG tablet Take 1 tablet (160 mg total) by mouth at bedtime. 03/10/15   Biagio Borg, MD  HYDROcodone-acetaminophen (NORCO/VICODIN) 5-325 MG per tablet Take 1-2 tablets by mouth every 4 (four) hours as needed for moderate pain. 05/30/15   Lori P Hvozdovic, PA-C  ondansetron (ZOFRAN) 4 MG tablet Take 1 tablet (4 mg total) by mouth every 6 (six) hours as needed for nausea. 05/30/15   Lori P Hvozdovic, PA-C  polyethylene glycol (MIRALAX / GLYCOLAX) packet Take 17 g  by mouth at bedtime. 05/30/15   Lori P Hvozdovic, PA-C  sodium chloride (OCEAN) 0.65 % nasal spray Place 1 spray into the nose daily as needed for congestion.     Historical Provider, MD  terazosin (HYTRIN) 2 MG capsule Take 1 capsule (2 mg total) by mouth once. Patient taking differently: Take 2 mg by mouth daily.  03/10/15   Biagio Borg, MD     Family History  Problem Relation Age of Onset  . Colon cancer Mother 1  . Brain cancer  Mother 34    mets from colon cancer  . Rectal cancer Neg Hx   . Stomach cancer Neg Hx     Social History   Social History  . Marital Status: Married    Spouse Name: N/A  . Number of Children: N/A  . Years of Education: N/A   Occupational History  . retired    Social History Main Topics  . Smoking status: Former Smoker    Quit date: 10/15/2005  . Smokeless tobacco: Never Used  . Alcohol Use: 0.0 oz/week    0 Standard drinks or equivalent per week     Comment: rare  . Drug Use: No  . Sexual Activity: Not on file   Other Topics Concern  . Not on file   Social History Narrative   Vital Signs: There were no vitals taken for this visit.  Physical Exam General: A&Ox3, NAD Abd: Soft, NT, ND, RUQ drain intact-NT, < 5cc serous output in JP bulb Ext: RUE PICC intact  Imaging: Ct Abdomen W Contrast  06/15/2015   CLINICAL DATA:  Follow-up liver abscess. Cholecystectomy in May. ERCP with stent removal 05/03/2015.  EXAM: CT ABDOMEN WITH CONTRAST  TECHNIQUE: Multidetector CT imaging of the abdomen was performed using the standard protocol following bolus administration of intravenous contrast.  CONTRAST:  153mL ISOVUE-300 IOPAMIDOL (ISOVUE-300) INJECTION 61%  COMPARISON:  05/29/2015.  FINDINGS: Lower chest: Lung bases show no acute findings. Coronary artery calcification. Heart size normal. No pericardial effusion. Pre pericardiac lymph nodes are sub cm in short axis size. No pericardial effusion.  Hepatobiliary: A heterogeneous fluid collection in the left hepatic lobe is seen with percutaneous drain in place, measuring 3.3 x 4.0 cm (previously 5.2 x 5.9 cm). Intermediate density lesion in the inferior right hepatic lobe measures 1.2 cm (series 3, image 34), likely stable and possibly focal fat. Focal fat along the falciform ligament. Pneumobilia. Cholecystectomy.  Pancreas: Negative.  Spleen: Negative.  Adrenals/Urinary Tract: Adrenal glands and kidneys are unremarkable. Visualized  portions of the ureters are decompressed.  Stomach/Bowel: Stomach and visualized portions of the small bowel, appendix and colon are unremarkable. Note is made that stool is seen throughout the visualized colon.  Vascular/Lymphatic: Atherosclerotic calcification of the arterial vasculature with the infrarenal aorta measuring up to 2.9 cm. Retroaortic left renal vein. No pathologically enlarged lymph nodes.  Other: No free fluid.  Mesenteries and peritoneum are unremarkable.  Musculoskeletal: No worrisome lytic or sclerotic lesions.  IMPRESSION: 1. Interval improvement and left hepatic lobe abscess without complete resolution. Percutaneous drain in place. 2. Coronary artery calcification. 3. Ectatic abdominal aorta at risk for aneurysm development. Recommend followup by ultrasound in 5 years. This recommendation follows ACR consensus guidelines: White Paper of the ACR Incidental Findings Committee II on Vascular Findings. J Am Coll Radiol 2013; 10:789-794.   Electronically Signed   By: Lorin Picket M.D.   On: 06/15/2015 14:08    Labs:  CBC:  Recent Labs  04/01/15  1443 05/23/15 1135 05/24/15 0603 05/26/15 0510  WBC 9.0 20.0* 19.6* 12.5*  HGB 12.3* 10.2* 10.1* 9.3*  HCT 37.8* 31.8* 31.1* 29.6*  PLT 361.0 545* 414* 401*    COAGS:  Recent Labs  02/15/15 0731 05/23/15 1135  INR 0.9 1.25    BMP:  Recent Labs  05/23/15 1135 05/24/15 0603 05/26/15 0510 05/28/15 0415  NA 136 137 137 141  K 3.7 3.7 3.8 4.0  CL 102 106 104 106  CO2 26 23 28 29   GLUCOSE 145* 141* 124* 154*  BUN 33* 27* 22* 14  CALCIUM 9.3 8.8* 8.7* 8.9  CREATININE 1.67* 1.33* 1.22 1.10  GFRNONAA 39* 51* 57* >60  GFRAA 45* 60* >60 >60    LIVER FUNCTION TESTS:  Recent Labs  04/01/15 1443 05/23/15 1135 05/24/15 0603 05/26/15 0510  BILITOT 0.4 0.8 0.9 0.2*  AST 14 56* 50* 26  ALT 9 45 40 30  ALKPHOS 44 71 68 64  PROT 7.5 7.5 6.7 5.9*  ALBUMIN 4.1 2.9* 2.6* 2.3*    Assessment: Chronic cholecystitis  with cholelithiasis who is s/p ERCP, sphincterotomy, stone removal and biliary stent placed 03/01/15 S/p lap cholecystectomy on 03/02/15 Repeat ERCP, stone removal and stent removal 05/03/15 Progressive RUQ abdominal pain, fever, nausea, and fatigue- admitted [facility] 05/23/15 MR 05/24/15 revealed GB fossa/hepatic fluid collection concerning for abscess S/p percutaneous hepatic drain placement by IR 05/24/15, Cx (+) Viridans, F/U CT imaging 8/14 with decrease in size of abscess. Discharged from hospital 05/30/15 with IV antibiotics Seen today in follow up with CT imaging.  CT today with interval improvement without complete resolution of hepatic abscess. Per patient approximately 10cc/24 hrs serous fluid, flushing daily-denies abdominal pain, fever or chills. He is currently receiving IV antibiotics via RUE PICC scheduled to complete therapy 06/20/15. He has follow up appointment with Dr. Baxter Flattery 06/21/15 and [name] 06/28/15  We recommend to continue drain for now and follow up in IR clinic in 2 weeks with repeat CT imaging. Patient given instructions to continue flushing and monitoring daily output.    SignedHedy Jacob 06/15/2015, 3:05 PM   Please refer to Dr. Pascal Lux attestation of this note for management and plan.

## 2015-06-16 ENCOUNTER — Other Ambulatory Visit: Payer: Self-pay | Admitting: Internal Medicine

## 2015-06-16 DIAGNOSIS — K750 Abscess of liver: Secondary | ICD-10-CM | POA: Diagnosis not present

## 2015-06-20 DIAGNOSIS — K750 Abscess of liver: Secondary | ICD-10-CM | POA: Diagnosis not present

## 2015-06-20 DIAGNOSIS — Z5181 Encounter for therapeutic drug level monitoring: Secondary | ICD-10-CM | POA: Diagnosis not present

## 2015-06-21 ENCOUNTER — Ambulatory Visit (INDEPENDENT_AMBULATORY_CARE_PROVIDER_SITE_OTHER): Payer: Medicare Other | Admitting: Internal Medicine

## 2015-06-21 ENCOUNTER — Telehealth: Payer: Self-pay | Admitting: *Deleted

## 2015-06-21 ENCOUNTER — Encounter: Payer: Self-pay | Admitting: Internal Medicine

## 2015-06-21 VITALS — BP 160/74 | HR 74 | Temp 98.3°F | Ht 67.5 in | Wt 156.0 lb

## 2015-06-21 DIAGNOSIS — K750 Abscess of liver: Secondary | ICD-10-CM

## 2015-06-21 MED ORDER — SODIUM CHLORIDE 0.9 % IV SOLN: 2.0000 g | INTRAVENOUS | Status: DC

## 2015-06-21 NOTE — Progress Notes (Signed)
   Subjective:    Patient ID: Neil [name], male    DOB: 1940/11/05, 74 y.o.   MRN: [id number]  HPI Here for hsfu. History of diverticulosis and choledocolithiasis s/p ERCP sphincterotomy, biliary stent placement in May 2016 and lap chole who developed RUQ pain and stone with stent removal in July.  He continued to have abdominal discomfort and found 8 x 8 x 10 cm multiloculated abscess and drain put in with Strep viridans.  He was placed on ceftriaxone and has been on it over 4 weeks.  Repeat CT done 8/31 noted improvement but not resolution, down to 3.3 x 4 cm.  Drain therefore was left in and continued on IV therapy.  No fever, no chills, no rash, no diarrhea.  Concerned because medication order stopped yesterday.  Minimal drainage now.  Repeat scan 9/14 scheduled.       Review of Systems  Constitutional: Negative for fever, chills, appetite change and fatigue.  Gastrointestinal: Negative for nausea and diarrhea.  Skin: Negative for rash.  Neurological: Negative for dizziness and light-headedness.  Hematological: Negative for adenopathy.       Objective:   Physical Exam  Constitutional: He appears well-developed and well-nourished. No distress.  Eyes: No scleral icterus.  Cardiovascular: Normal rate, regular rhythm and normal heart sounds.   No murmur heard. Pulmonary/Chest: Effort normal and breath sounds normal. No respiratory distress.  Abdominal: Soft. Bowel sounds are normal.  Drain covered, no surrounding erythema, no tenderness  Skin: No rash noted.          Assessment & Plan:

## 2015-06-21 NOTE — Telephone Encounter (Signed)
Verbal order per Dr. Linus Salmons given to [facility] at [place] to extend patient's IV antibiotics for 2 weeks. Myrtis Hopping

## 2015-06-21 NOTE — Assessment & Plan Note (Signed)
Reviewed CT scans personally, discussed medications with patient, discussed duration.  Will continue with IV until drain out then switch to oral Augmentin.  Will f/u after scan next Thursday and consider picc removal then if near resolution.

## 2015-06-22 DIAGNOSIS — K750 Abscess of liver: Secondary | ICD-10-CM | POA: Diagnosis not present

## 2015-06-24 ENCOUNTER — Ambulatory Visit: Payer: Medicare Other | Admitting: Internal Medicine

## 2015-06-24 ENCOUNTER — Other Ambulatory Visit: Payer: Self-pay | Admitting: Internal Medicine

## 2015-06-24 DIAGNOSIS — K750 Abscess of liver: Secondary | ICD-10-CM

## 2015-06-27 DIAGNOSIS — B955 Unspecified streptococcus as the cause of diseases classified elsewhere: Secondary | ICD-10-CM | POA: Diagnosis not present

## 2015-06-27 DIAGNOSIS — K750 Abscess of liver: Secondary | ICD-10-CM | POA: Diagnosis not present

## 2015-06-28 ENCOUNTER — Ambulatory Visit (INDEPENDENT_AMBULATORY_CARE_PROVIDER_SITE_OTHER): Payer: Medicare Other | Admitting: Internal Medicine

## 2015-06-28 ENCOUNTER — Encounter: Payer: Self-pay | Admitting: Internal Medicine

## 2015-06-28 VITALS — BP 124/60 | HR 80 | Ht 67.0 in | Wt 156.8 lb

## 2015-06-28 DIAGNOSIS — R1011 Right upper quadrant pain: Secondary | ICD-10-CM | POA: Diagnosis not present

## 2015-06-28 DIAGNOSIS — K750 Abscess of liver: Secondary | ICD-10-CM | POA: Diagnosis not present

## 2015-06-28 DIAGNOSIS — K8050 Calculus of bile duct without cholangitis or cholecystitis without obstruction: Secondary | ICD-10-CM | POA: Diagnosis not present

## 2015-06-28 NOTE — Patient Instructions (Signed)
Please follow up as needed 

## 2015-06-28 NOTE — Progress Notes (Signed)
HISTORY OF PRESENT ILLNESS:  Neil [name] is a 74 y.o. male who presents for follow-up after being hospitalized with symptom hepatic abscess. The patient's history dates back to early May 2016 when he was evaluated in the office for symptomatic choledocholithiasis and cholelithiasis with probable bouts of ascending cholangitis. He was kept on antibiotics without further episodes and underwent ERCP with sphincterotomy and common bile duct stone extraction around 03/01/2015. Despite extraction of multiple stones, several large residual stones were present and required biliary stent placement. At the patient's insistence, he underwent laparoscopic cholecystectomy the following day. He was evaluated in June with complaints of right-sided pain. Plain films of the abdomen as well as abdominal ultrasound with imaging of the liver were unremarkable. Liver tests were normal. He continued with intermittent complaints which were treatable 2 residual common duct stones possibly. Around July 19 repeat ERCP was performed with stent removal and complete stone extraction. He did well. He received appropriate perioperative antibiotics. He was then seen in the office August 8 with complaints of abdominal pain, fevers, and chills. He was admitted. He was found to have hepatic abscess with out evidence for choledocholithiasis. He was evaluated by interventional radiology with drain placed. Seen by infectious diseases with antibiotics started. He has since been reevaluated with interventional radiology, CT imaging, and infectious disease outpatient appointment. His hepatic abscess is improving but still present. Patient is accompanied today by his wife. Overall he states that he is feeling better without particular complaints. No issues with fever. Improved appetite with modest weight gain. Tolerating the percutaneous drain without issues.  REVIEW OF SYSTEMS:  All non-GI ROS negative except for sinus and allergy  Past Medical  History  Diagnosis Date  . ALLERGIC RHINITIS 12/04/2007  . BENIGN PROSTATIC HYPERTROPHY 06/05/2007  . BUNDLE BRANCH BLOCK, RIGHT 07/21/2009  . BURSITIS, RIGHT SHOULDER 11/06/2010  . COLONIC POLYPS, HX OF 06/05/2007    tubular adenoma also 02/19/2013  . DIVERTICULOSIS, COLON 07/21/2009  . ERECTILE DYSFUNCTION 06/05/2007  . HYPERLIPIDEMIA 06/05/2007  . HYPERTENSION 06/05/2007  . INGUINAL HERNIA, RIGHT, SMALL 11/06/2010  . SKIN LESION 06/03/2008  . DIABETES MELLITUS, TYPE II 12/04/2007    pt states not diabetic-no meds 02-05-13 PV  . Gallstones   . Liver abscess 05/23/2015  . Hypotension   . Choledocholithiasis     Past Surgical History  Procedure Laterality Date  . Colonoscopy    . Polypectomy    . Hemorrhoid surgery    . Finger surgery Left   . Tonsillectomy    . Inguinal hernia repair Right     8'14 repair  . Ercp N/A 03/01/2015    Procedure: ENDOSCOPIC RETROGRADE CHOLANGIOPANCREATOGRAPHY (ERCP);  Surgeon: Irene Shipper, MD;  Location: [facility];  Service: Endoscopy;  Laterality: N/A;  Dr Dalbert Batman wants patient to stay overnight after ERCP  . Cholecystectomy N/A 03/02/2015    Procedure: LAPAROSCOPIC CHOLECYSTECTOMY;  Surgeon: Fanny Skates, MD;  Location: [facility];  Service: General;  Laterality: N/A;  . Ercp N/A 05/03/2015    Procedure: ENDOSCOPIC RETROGRADE CHOLANGIOPANCREATOGRAPHY (ERCP);  Surgeon: Irene Shipper, MD;  Location: [facility];  Service: Endoscopy;  Laterality: N/A;    Social History Neil [name]  reports that he quit smoking about 9 years ago. He has never used smokeless tobacco. He reports that he does not drink alcohol or use illicit drugs.  family history includes Brain cancer (age of onset: 49) in his mother; Colon cancer (age of onset: 49) in his mother. There is no history  of Rectal cancer or Stomach cancer.  Allergies  Allergen Reactions  . Ezetimibe Other (See Comments)    Muscle and bone pain  . Statins     REACTION: muscle aches       PHYSICAL  EXAMINATION: Vital signs: BP 124/60 mmHg  Pulse 80  Ht 5\' 7"  (1.702 m)  Wt 156 lb 12.8 oz (71.124 kg)  BMI 24.55 kg/m2 General: Well-developed, well-nourished, no acute distress HEENT: Sclerae are anicteric, conjunctiva pink. Oral mucosa intact Lungs: Clear Heart: Regular Abdomen: soft, nontender, nondistended, no obvious ascites, no peritoneal signs, normal bowel sounds. No organomegaly. Percutaneous drain site bandaged. No underlying tenderness Extremities: No clubbing cyanosis or edema Psychiatric: alert and oriented x3. Cooperative   ASSESSMENT:  #1. Hepatic abscess is a patient with a history of complex choledocholithiasis with bouts of ascending cholangitis prior to his initial presentation. Subsequent ERCPs with common duct stone extraction and laparoscopic cholecystectomy as described. Currently on antibiotics via PICC line and indwelling percutaneous catheter to the abscess cavity. Being followed by infectious diseases. No evidence for residual choledocholithiasis on MRCP  PLAN:  #1. Continued management of hepatic abscess with antibiotics and drainage per infectious diseases and interventional radiology. #2. GI follow-up as needed. I did mention to them that some patients can develop choledocholithiasis after bile duct clearance. Hopefully this will not be the case

## 2015-06-29 ENCOUNTER — Ambulatory Visit
Admission: RE | Admit: 2015-06-29 | Discharge: 2015-06-29 | Disposition: A | Discharge status: Home / Self Care | Payer: Medicare Other | Source: Ambulatory Visit | Attending: Internal Medicine | Admitting: Internal Medicine

## 2015-06-29 ENCOUNTER — Ambulatory Visit
Admission: RE | Admit: 2015-06-29 | Discharge: 2015-06-29 | Disposition: A | Discharge status: Home / Self Care | Payer: Medicare Other | Source: Ambulatory Visit | Attending: Interventional Radiology | Admitting: Interventional Radiology

## 2015-06-29 DIAGNOSIS — K750 Abscess of liver: Secondary | ICD-10-CM | POA: Insufficient documentation

## 2015-06-29 MED ORDER — IOPAMIDOL (ISOVUE-300) INJECTION 61%
100.0000 mL | Freq: Once | INTRAVENOUS | Status: AC | PRN
  Administered 2015-06-29: 100 mL via INTRAVENOUS

## 2015-06-29 NOTE — Progress Notes (Signed)
Patient ID: Neil [name], male   DOB: 12-02-1940, 74 y.o.   MRN: [id number]    Chief Complaint: Patient was seen in consultation today for No chief complaint on file.  at the request of Comer  Referring Physician(s): Drs. Comer (ID) and Henrene Pastor (GI)  History of Present Illness: Neil [name] is a 74 y.o. male post CT-guided percutaneous drainage catheter placement 05/24/2015. Patient was initially seen in the interventional radiology drain clinic on 06/15/2015 and returns today for discussion of results from repeat abdominal CT performed earlier today as well as percutaneous drainage catheter management.  The patient reports no significant output from the drainage catheter during the past several days. He denies fever or chills.   Past Medical History  Diagnosis Date  . ALLERGIC RHINITIS 12/04/2007  . BENIGN PROSTATIC HYPERTROPHY 06/05/2007  . BUNDLE BRANCH BLOCK, RIGHT 07/21/2009  . BURSITIS, RIGHT SHOULDER 11/06/2010  . COLONIC POLYPS, HX OF 06/05/2007    tubular adenoma also 02/19/2013  . DIVERTICULOSIS, COLON 07/21/2009  . ERECTILE DYSFUNCTION 06/05/2007  . HYPERLIPIDEMIA 06/05/2007  . HYPERTENSION 06/05/2007  . INGUINAL HERNIA, RIGHT, SMALL 11/06/2010  . SKIN LESION 06/03/2008  . DIABETES MELLITUS, TYPE II 12/04/2007    pt states not diabetic-no meds 02-05-13 PV  . Gallstones   . Liver abscess 05/23/2015  . Hypotension   . Choledocholithiasis     Past Surgical History  Procedure Laterality Date  . Colonoscopy    . Polypectomy    . Hemorrhoid surgery    . Finger surgery Left   . Tonsillectomy    . Inguinal hernia repair Right     8'14 repair  . Ercp N/A 03/01/2015    Procedure: ENDOSCOPIC RETROGRADE CHOLANGIOPANCREATOGRAPHY (ERCP);  Surgeon: Irene Shipper, MD;  Location: [facility];  Service: Endoscopy;  Laterality: N/A;  Dr Dalbert Batman wants patient to stay overnight after ERCP  . Cholecystectomy N/A 03/02/2015    Procedure: LAPAROSCOPIC CHOLECYSTECTOMY;  Surgeon: Fanny Skates, MD;   Location: [facility];  Service: General;  Laterality: N/A;  . Ercp N/A 05/03/2015    Procedure: ENDOSCOPIC RETROGRADE CHOLANGIOPANCREATOGRAPHY (ERCP);  Surgeon: Irene Shipper, MD;  Location: [facility];  Service: Endoscopy;  Laterality: N/A;    Allergies: Ezetimibe and Statins  Medications: Prior to Admission medications   Medication Sig Start Date End Date Taking? Authorizing Provider  acetaminophen (TYLENOL) 500 MG tablet Take 1,500 mg by mouth 2 (two) times daily.     Historical Provider, MD  benazepril (LOTENSIN) 40 MG tablet Take 1 tablet (40 mg total) by mouth daily. 03/10/15   Biagio Borg, MD  fenofibrate 160 MG tablet Take 1 tablet (160 mg total) by mouth at bedtime. 03/10/15   Biagio Borg, MD  polyethylene glycol [facility] / Floria Raveling) packet Take 17 g by mouth at bedtime. 05/30/15   Lori P Hvozdovic, PA-C  sodium chloride (OCEAN) 0.65 % nasal spray Place 1 spray into the nose daily as needed for congestion.     Historical Provider, MD  terazosin (HYTRIN) 2 MG capsule Take 1 capsule (2 mg total) by mouth once. Patient taking differently: Take 2 mg by mouth daily.  03/10/15   Biagio Borg, MD     Family History  Problem Relation Age of Onset  . Colon cancer Mother 18  . Brain cancer Mother 18    mets from colon cancer  . Rectal cancer Neg Hx   . Stomach cancer Neg Hx     Social History   Social History  .  Marital Status: Married    Spouse Name: N/A  . Number of Children: N/A  . Years of Education: N/A   Occupational History  . retired    Social History Main Topics  . Smoking status: Former Smoker    Quit date: 10/15/2005  . Smokeless tobacco: Never Used  . Alcohol Use: No  . Drug Use: No  . Sexual Activity: Not on file   Other Topics Concern  . Not on file   Social History Narrative    ECOG Status: 1 - Symptomatic but completely ambulatory  Review of Systems: A 12 point ROS discussed and pertinent positives are indicated in the HPI above.  All other systems  are negative.  Review of Systems  Constitutional: Negative.  Negative for fever and fatigue.  Cardiovascular: Negative.   Gastrointestinal: Negative.     Vital Signs: There were no vitals taken for this visit.  Physical Exam  Constitutional: He appears well-developed and well-nourished.  Abdominal:    Location of the percutaneous drain.  Nursing note and vitals reviewed.  Imaging: Ct Abdomen W Contrast  06/29/2015   CLINICAL DATA:  History of hepatic abscess post CT-guided percutaneous drainage catheter placement 05/24/2015.  Patient returns today to the interventional radiology Clinic for follow-up CT scan and percutaneous drainage catheter management.  EXAM: CT ABDOMEN WITH CONTRAST  TECHNIQUE: Multidetector CT imaging of the abdomen was performed using the standard protocol following bolus administration of intravenous contrast.  CONTRAST:  175mL ISOVUE-300 IOPAMIDOL (ISOVUE-300) INJECTION 61%  COMPARISON:  None.  FINDINGS: Unchanged positioning of the percutaneous drainage catheter with end coiled and locked within the caudal aspect of the the lateral segment of the left lobe of the liver. Continued interval decrease in size of residual approximately 3.4 x 2.5 cm ill-defined area of hypo attenuation about the cranial aspect of the percutaneous drainage catheter (previously, 4.0 x 3.3 cm). No new definable / drainable hepatic abscesses. Re- demonstrated minimal amount of focal fatty infiltration adjacent to the fissure for ligamentum teres. No new hepatic lesions. No ascites.  Post cholecystectomy and biliary sphincterotomy with small amount of pneumobilia seen within the nondependent portion of the left lobe of the liver. No ascites.  There is symmetric enhancement of the bilateral kidneys. No definite renal stones. No discrete renal lesions. No urinary obstruction or perinephric stranding.  Normal appearance of the bilateral adrenal glands, pancreas and spleen.  Moderate to large colonic  stool burden without evidence of enteric obstruction. Normal appearance of the appendix. No pneumoperitoneum, pneumatosis or portal venous gas.  No retroperitoneal mesenteric adenopathy within image upper abdomen.  Moderate to large amount of mixed calcified and noncalcified atherosclerotic plaque within a normal caliber abdominal aorta. The major branch vessels of the abdominal aorta appear widely patent on this non CTA examination.  No acute or aggressive osseous abnormalities. 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 Stigmata of DISH within the caudal aspect of the thoracic spine.  Limited visualization the lower thorax is negative for focal airspace opacity or pleural effusion.  Normal heart size. Coronary artery calcifications. No pericardial effusion.  IMPRESSION: 1. Continued interval decrease in size of residual approximately 3.4 cm ill-defined area of hypoattenuation about the cranial aspect of the percutaneous hepatic abscess drainage catheter, previously, 4 cm. No new definable/drainable hepatic abscess. 2. Stable sequela of prior cholecystectomy and biliary sphincterotomy.  PLAN: Above was discussed with Dr. Linus Salmons (ID) at the time of procedure completion and the decision was made to remove the percutaneous drainage  catheter. This was done so at the patient's bedside without incident.  The patient is to continue on intravenous antibiotics and stool his follow-up appointment with Dr. Linus Salmons tomorrow, 9/15, at which time, the patient will likely be transitioned from IV to oral antibiotics.  Would not recommend follow-up imaging unless the patient experiences recurrent symptoms.   Electronically Signed   By: Sandi Mariscal M.D.   On: 06/29/2015 11:19   Ct Abdomen W Contrast  06/15/2015   CLINICAL DATA:  Follow-up liver abscess. Cholecystectomy in May. ERCP with stent removal 05/03/2015.  EXAM: CT ABDOMEN WITH CONTRAST  TECHNIQUE: Multidetector CT imaging of the abdomen was  performed using the standard protocol following bolus administration of intravenous contrast.  CONTRAST:  175mL ISOVUE-300 IOPAMIDOL (ISOVUE-300) INJECTION 61%  COMPARISON:  05/29/2015.  FINDINGS: Lower chest: Lung bases show no acute findings. Coronary artery calcification. Heart size normal. No pericardial effusion. Pre pericardiac lymph nodes are sub cm in short axis size. No pericardial effusion.  Hepatobiliary: A heterogeneous fluid collection in the left hepatic lobe is seen with percutaneous drain in place, measuring 3.3 x 4.0 cm (previously 5.2 x 5.9 cm). Intermediate density lesion in the inferior right hepatic lobe measures 1.2 cm (series 3, image 34), likely stable and possibly focal fat. Focal fat along the falciform ligament. Pneumobilia. Cholecystectomy.  Pancreas: Negative.  Spleen: Negative.  Adrenals/Urinary Tract: Adrenal glands and kidneys are unremarkable. Visualized portions of the ureters are decompressed.  Stomach/Bowel: Stomach and visualized portions of the small bowel, appendix and colon are unremarkable. Note is made that stool is seen throughout the visualized colon.  Vascular/Lymphatic: Atherosclerotic calcification of the arterial vasculature with the infrarenal aorta measuring up to 2.9 cm. Retroaortic left renal vein. No pathologically enlarged lymph nodes.  Other: No free fluid.  Mesenteries and peritoneum are unremarkable.  Musculoskeletal: No worrisome lytic or sclerotic lesions.  IMPRESSION: 1. Interval improvement and left hepatic lobe abscess without complete resolution. Percutaneous drain in place. 2. Coronary artery calcification. 3. Ectatic abdominal aorta at risk for aneurysm development. Recommend followup by ultrasound in 5 years. This recommendation follows ACR consensus guidelines: White Paper of the ACR Incidental Findings Committee II on Vascular Findings. J Am Coll Radiol 2013; 10:789-794.   Electronically Signed   By: Lorin Picket M.D.   On: 06/15/2015 14:08     Labs:  CBC:  Recent Labs  04/01/15 1443 05/23/15 1135 05/24/15 0603 05/26/15 0510  WBC 9.0 20.0* 19.6* 12.5*  HGB 12.3* 10.2* 10.1* 9.3*  HCT 37.8* 31.8* 31.1* 29.6*  PLT 361.0 545* 414* 401*    COAGS:  Recent Labs  02/15/15 0731 05/23/15 1135  INR 0.9 1.25    BMP:  Recent Labs  05/23/15 1135 05/24/15 0603 05/26/15 0510 05/28/15 0415  NA 136 137 137 141  K 3.7 3.7 3.8 4.0  CL 102 106 104 106  CO2 26 23 28 29   GLUCOSE 145* 141* 124* 154*  BUN 33* 27* 22* 14  CALCIUM 9.3 8.8* 8.7* 8.9  CREATININE 1.67* 1.33* 1.22 1.10  GFRNONAA 39* 51* 57* >60  GFRAA 45* 60* >60 >60    LIVER FUNCTION TESTS:  Recent Labs  04/01/15 1443 05/23/15 1135 05/24/15 0603 05/26/15 0510  BILITOT 0.4 0.8 0.9 0.2*  AST 14 56* 50* 26  ALT 9 45 40 30  ALKPHOS 44 71 68 64  PROT 7.5 7.5 6.7 5.9*  ALBUMIN 4.1 2.9* 2.6* 2.3*   Assessment and Plan:  Neil [name] is a 74 y.o. male  post CT-guided percutaneous drainage catheter placement 05/24/2015. The patient was initially seen in the interventional radiology drain clinic on 06/15/2015 and returns today for discussion of results from repeat abdominal CT performed earlier today as well as percutaneous drainage catheter management.  The patient reports no significant output from the drainage catheter during the past several days. He denies fever or chills.  Fortunately, repeat contrast enhanced abdominal CT performed earlier today demonstrates continued interval decrease in residual ill-defined area of hypoattenuation immediately cranial to the percutaneous drainage catheter coil. While this ill-defined fluid collection may represent residual infection, it is separate from the sideholes of the percutaneous drainage catheter and given the lack of any significant output from this drainage catheter during the past several days, the decision was made to remove the per continues drainage catheter after a discussion with referring infectious  disease physician, Dr. Linus Salmons. This was done so at the bedside without incident. A dressing was placed.  The patient was encouraged to keep his follow-up appointment with Dr. Linus Salmons for tomorrow (9/15) at which time, consideration for potential transitioning the patient from IV to oral antiemetics will be considered.  I would not recommend follow-up imaging unless the patient experiences recurrent symptoms.  A copy of this report was sent to the requesting provider on this date.  SignedSandi Mariscal 06/29/2015, 5:08 PM   I spent a total of 15 Minutes in face to face in clinical consultation, greater than 50% of which was counseling/coordinating care for hepatic abscess, post per continues drainage catheter placement

## 2015-06-30 ENCOUNTER — Ambulatory Visit (INDEPENDENT_AMBULATORY_CARE_PROVIDER_SITE_OTHER): Payer: Medicare Other | Admitting: Internal Medicine

## 2015-06-30 ENCOUNTER — Telehealth: Payer: Self-pay | Admitting: *Deleted

## 2015-06-30 ENCOUNTER — Encounter: Payer: Self-pay | Admitting: Internal Medicine

## 2015-06-30 VITALS — BP 142/71 | HR 76 | Temp 97.9°F | Wt 155.0 lb

## 2015-06-30 DIAGNOSIS — K750 Abscess of liver: Secondary | ICD-10-CM

## 2015-06-30 DIAGNOSIS — Z720 Tobacco use: Secondary | ICD-10-CM

## 2015-06-30 DIAGNOSIS — F17200 Nicotine dependence, unspecified, uncomplicated: Secondary | ICD-10-CM

## 2015-06-30 MED ORDER — AMOXICILLIN-POT CLAVULANATE 500-125 MG PO TABS: 1.0000 | ORAL_TABLET | Freq: Three times a day (TID) | ORAL | Status: DC

## 2015-06-30 NOTE — Assessment & Plan Note (Signed)
Continues to smoke.

## 2015-06-30 NOTE — Telephone Encounter (Signed)
Verbal order per Dr. Linus Salmons given to [facility] [place] to pull patient's picc line at the end of IV therapy in six days. Neil [name]

## 2015-06-30 NOTE — Progress Notes (Signed)
   Subjective:    Patient ID: Neil [name], male    DOB: [birth year], 74 y.o.   MRN: [id number]  HPI Here for follow up. History of diverticulosis and choledocolithiasis s/p ERCP sphincterotomy, biliary stent placement in May 2016 and lap chole who developed RUQ pain and stone with stent removal in July.  He continued to have abdominal discomfort and found 8 x 8 x 10 cm multiloculated abscess and drain put in with Strep viridans.  He was placed on ceftriaxone and has been on it over 4 weeks.  Repeat CT done 8/31 noted improvement but not resolution, down to 3.3 x 4 cm.  Drain therefore was left in and continued on IV therapy.  No fever, no chills, no rash, no diarrhea.     Here after repeat CT scan done yesterday.  I discussed scan results with Dr. Pascal Lux and with little drainage coming out of drain, appears nearly resolved.  Some residual findings on CT.  Has about 7 days of ceftriaxone left at home.     Review of Systems  Constitutional: Negative for fever, chills, appetite change and fatigue.  Gastrointestinal: Negative for nausea and diarrhea.  Skin: Negative for rash.  Neurological: Negative for dizziness and light-headedness.  Hematological: Negative for adenopathy.       Objective:   Physical Exam  Constitutional: He appears well-developed and well-nourished. No distress.  Eyes: No scleral icterus.  Cardiovascular: Normal rate, regular rhythm and normal heart sounds.   No murmur heard. Pulmonary/Chest: Effort normal and breath sounds normal. No respiratory distress.  Abdominal: Soft. Bowel sounds are normal.  Skin: No rash noted.          Assessment & Plan:

## 2015-06-30 NOTE — Assessment & Plan Note (Signed)
Doing well.  Now drain out.  Will have him finish his IV medication then pull picc line and transition to oral.  He will follow up in 1-2 months on Augmentin and see how he is doing. Consider stopping in 2 months.  No repeat CT scan indicated unless new symptoms develop.

## 2015-07-04 ENCOUNTER — Telehealth: Payer: Self-pay | Admitting: *Deleted

## 2015-07-04 ENCOUNTER — Telehealth: Payer: Self-pay | Admitting: Internal Medicine

## 2015-07-04 DIAGNOSIS — B955 Unspecified streptococcus as the cause of diseases classified elsewhere: Secondary | ICD-10-CM | POA: Diagnosis not present

## 2015-07-04 DIAGNOSIS — K750 Abscess of liver: Secondary | ICD-10-CM | POA: Diagnosis not present

## 2015-07-04 NOTE — Telephone Encounter (Signed)
[facility] asking for permission to pull PICC on 9/21 instead of 9/20. Patient completes IV antibiotics 9/20, but they will have contractors in the home throughout the day. Patient requested PICC to be pulled 9/21 instead for their convenience.  Landis Gandy, RN

## 2015-07-04 NOTE — Telephone Encounter (Signed)
Patient called to change his and his wifes appointment and he wanted to be sure his lab orders were in and that they were not fasting labs. Can you please call him to be sure of this. Also, I noticed 2 different future lab orders for him Pt is aware you are out till Tuesday

## 2015-07-05 NOTE — Telephone Encounter (Signed)
Pt advised of labs.

## 2015-07-05 NOTE — Telephone Encounter (Signed)
ok 

## 2015-07-06 DIAGNOSIS — K750 Abscess of liver: Secondary | ICD-10-CM | POA: Diagnosis not present

## 2015-07-08 ENCOUNTER — Other Ambulatory Visit (INDEPENDENT_AMBULATORY_CARE_PROVIDER_SITE_OTHER): Payer: Medicare Other

## 2015-07-08 ENCOUNTER — Ambulatory Visit: Payer: Medicare Other | Admitting: Internal Medicine

## 2015-07-08 ENCOUNTER — Encounter: Payer: Self-pay | Admitting: Internal Medicine

## 2015-07-08 DIAGNOSIS — Z0189 Encounter for other specified special examinations: Secondary | ICD-10-CM | POA: Diagnosis not present

## 2015-07-08 DIAGNOSIS — Z0000 Encounter for general adult medical examination without abnormal findings: Secondary | ICD-10-CM

## 2015-07-08 DIAGNOSIS — I10 Essential (primary) hypertension: Secondary | ICD-10-CM | POA: Diagnosis not present

## 2015-07-08 DIAGNOSIS — E785 Hyperlipidemia, unspecified: Secondary | ICD-10-CM | POA: Diagnosis not present

## 2015-07-08 DIAGNOSIS — E119 Type 2 diabetes mellitus without complications: Secondary | ICD-10-CM | POA: Diagnosis not present

## 2015-07-08 LAB — HEPATIC FUNCTION PANEL
ALT: 9 U/L (ref 0–53)
AST: 15 U/L (ref 0–37)
Albumin: 4.3 g/dL (ref 3.5–5.2)
Alkaline Phosphatase: 41 U/L (ref 39–117)
Bilirubin, Direct: 0.1 mg/dL (ref 0.0–0.3)
Total Bilirubin: 0.5 mg/dL (ref 0.2–1.2)
Total Protein: 7.3 g/dL (ref 6.0–8.3)

## 2015-07-08 LAB — LIPID PANEL
Cholesterol: 159 mg/dL (ref 0–200)
HDL: 39 mg/dL — ABNORMAL LOW (ref >39.00)
LDL Cholesterol: 93 mg/dL (ref 0–99)
NonHDL: 119.8
Total CHOL/HDL Ratio: 4
Triglycerides: 135 mg/dL (ref 0.0–149.0)
VLDL: 27 mg/dL (ref 0.0–40.0)

## 2015-07-08 LAB — URINALYSIS, ROUTINE W REFLEX MICROSCOPIC
Bilirubin Urine: NEGATIVE
Hgb urine dipstick: NEGATIVE
Ketones, ur: NEGATIVE
Leukocytes, UA: NEGATIVE
Nitrite: NEGATIVE
Specific Gravity, Urine: 1.025 (ref 1.000–1.030)
Total Protein, Urine: NEGATIVE
Urine Glucose: NEGATIVE
Urobilinogen, UA: 0.2 (ref 0.0–1.0)
pH: 6 (ref 5.0–8.0)

## 2015-07-08 LAB — CBC WITH DIFFERENTIAL/PLATELET
Basophils Absolute: 0 10*3/uL (ref 0.0–0.1)
Basophils Relative: 0.6 % (ref 0.0–3.0)
Eosinophils Absolute: 0.1 10*3/uL (ref 0.0–0.7)
Eosinophils Relative: 2.1 % (ref 0.0–5.0)
HCT: 40.3 % (ref 39.0–52.0)
Hemoglobin: 13.2 g/dL (ref 13.0–17.0)
Lymphocytes Relative: 27.8 % (ref 12.0–46.0)
Lymphs Abs: 1.4 10*3/uL (ref 0.7–4.0)
MCHC: 32.8 g/dL (ref 30.0–36.0)
MCV: 85.2 fl (ref 78.0–100.0)
Monocytes Absolute: 0.6 10*3/uL (ref 0.1–1.0)
Monocytes Relative: 12.4 % — ABNORMAL HIGH (ref 3.0–12.0)
Neutro Abs: 2.8 10*3/uL (ref 1.4–7.7)
Neutrophils Relative %: 57.1 % (ref 43.0–77.0)
Platelets: 204 10*3/uL (ref 150.0–400.0)
RBC: 4.74 Mil/uL (ref 4.22–5.81)
RDW: 17.4 % — ABNORMAL HIGH (ref 11.5–15.5)
WBC: 4.9 10*3/uL (ref 4.0–10.5)

## 2015-07-08 LAB — HEMOGLOBIN A1C: Hgb A1c MFr Bld: 5.6 % (ref 4.6–6.5)

## 2015-07-08 LAB — BASIC METABOLIC PANEL
BUN: 14 mg/dL (ref 6–23)
CO2: 30 mEq/L (ref 19–32)
Calcium: 9.8 mg/dL (ref 8.4–10.5)
Chloride: 105 mEq/L (ref 96–112)
Creatinine, Ser: 1.27 mg/dL (ref 0.40–1.50)
GFR: 59.05 mL/min — ABNORMAL LOW (ref >60.00)
Glucose, Bld: 114 mg/dL — ABNORMAL HIGH (ref 70–99)
Potassium: 4.8 mEq/L (ref 3.5–5.1)
Sodium: 142 mEq/L (ref 135–145)

## 2015-07-08 LAB — MICROALBUMIN / CREATININE URINE RATIO
Creatinine,U: 289.9 mg/dL
Microalb Creat Ratio: 1.9 mg/g (ref 0.0–30.0)
Microalb, Ur: 5.6 mg/dL — ABNORMAL HIGH (ref 0.0–1.9)

## 2015-07-08 LAB — PSA: PSA: 2 ng/mL (ref 0.10–4.00)

## 2015-07-08 LAB — TSH: TSH: 0.5 u[IU]/mL (ref 0.35–4.50)

## 2015-07-12 ENCOUNTER — Ambulatory Visit (INDEPENDENT_AMBULATORY_CARE_PROVIDER_SITE_OTHER): Payer: Medicare Other | Admitting: Internal Medicine

## 2015-07-12 ENCOUNTER — Encounter: Payer: Self-pay | Admitting: Internal Medicine

## 2015-07-12 VITALS — BP 116/62 | HR 67 | Temp 97.9°F | Ht 67.0 in | Wt 159.0 lb

## 2015-07-12 DIAGNOSIS — R739 Hyperglycemia, unspecified: Secondary | ICD-10-CM

## 2015-07-12 DIAGNOSIS — Z23 Encounter for immunization: Secondary | ICD-10-CM | POA: Diagnosis not present

## 2015-07-12 DIAGNOSIS — Z0000 Encounter for general adult medical examination without abnormal findings: Secondary | ICD-10-CM

## 2015-07-12 NOTE — Assessment & Plan Note (Signed)

## 2015-07-12 NOTE — Assessment & Plan Note (Signed)
stable overall by history and exam, recent data reviewed with pt, and pt to continue medical treatment as before,  to f/u any worsening symptoms or concerns Lab Results  Component Value Date   HGBA1C 5.6 07/08/2015

## 2015-07-12 NOTE — Progress Notes (Signed)
Subjective:    Patient ID: Neil [name], male    DOB: [birth year], 74 y.o.   MRN: [id number]  HPI  Here for wellness and f/u;  Overall doing ok;  Pt denies Chest pain, worsening SOB, DOE, wheezing, orthopnea, PND, worsening Kimorah Ridolfi edema, palpitations, dizziness or syncope.  Pt denies neurological change such as new headache, facial or extremity weakness.  Pt denies polydipsia, polyuria, or low sugar symptoms. Pt states overall good compliance with treatment and medications, good tolerability, and has been trying to follow appropriate diet.  Pt denies worsening depressive symptoms, suicidal ideation or panic. No fever, night sweats, wt loss, loss of appetite, or other constitutional symptoms.  Pt states good ability with ADL's, has low fall risk, home safety reviewed and adequate, no other significant changes in hearing or vision, and only occasionally active with exercise. S/p ERCP, s/p ccx, repeat ERCP for stent removal, and most liver abscess tx, just had PICC out RUE last wk, drainremoved the previous wk. Now finishing the augmentin, no diarrhea. Wt Readings from Last 3 Encounters:  07/12/15 159 lb (72.122 kg)  06/30/15 155 lb (70.308 kg)  06/28/15 156 lb 12.8 oz (71.124 kg)   Past Medical History  Diagnosis Date  . ALLERGIC RHINITIS 12/04/2007  . BENIGN PROSTATIC HYPERTROPHY 06/05/2007  . BUNDLE BRANCH BLOCK, RIGHT 07/21/2009  . BURSITIS, RIGHT SHOULDER 11/06/2010  . COLONIC POLYPS, HX OF 06/05/2007    tubular adenoma also 02/19/2013  . DIVERTICULOSIS, COLON 07/21/2009  . ERECTILE DYSFUNCTION 06/05/2007  . HYPERLIPIDEMIA 06/05/2007  . HYPERTENSION 06/05/2007  . INGUINAL HERNIA, RIGHT, SMALL 11/06/2010  . SKIN LESION 06/03/2008  . DIABETES MELLITUS, TYPE II 12/04/2007    pt states not diabetic-no meds 02-05-13 PV  . Gallstones   . Liver abscess 05/23/2015  . Hypotension   . Choledocholithiasis    Past Surgical History  Procedure Laterality Date  . Colonoscopy    . Polypectomy    . Hemorrhoid  surgery    . Finger surgery Left   . Tonsillectomy    . Inguinal hernia repair Right     8'14 repair  . Ercp N/A 03/01/2015    Procedure: ENDOSCOPIC RETROGRADE CHOLANGIOPANCREATOGRAPHY (ERCP);  Surgeon: Irene Shipper, MD;  Location: [facility];  Service: Endoscopy;  Laterality: N/A;  Dr Dalbert Batman wants patient to stay overnight after ERCP  . Cholecystectomy N/A 03/02/2015    Procedure: LAPAROSCOPIC CHOLECYSTECTOMY;  Surgeon: Fanny Skates, MD;  Location: [facility];  Service: General;  Laterality: N/A;  . Ercp N/A 05/03/2015    Procedure: ENDOSCOPIC RETROGRADE CHOLANGIOPANCREATOGRAPHY (ERCP);  Surgeon: Irene Shipper, MD;  Location: [facility];  Service: Endoscopy;  Laterality: N/A;    reports that he quit smoking about 9 years ago. He has never used smokeless tobacco. He reports that he does not drink alcohol or use illicit drugs. family history includes Brain cancer (age of onset: 74) in his mother; Colon cancer (age of onset: 22) in his mother. There is no history of Rectal cancer or Stomach cancer. Allergies  Allergen Reactions  . Ezetimibe Other (See Comments)    Muscle and bone pain  . Statins     REACTION: muscle aches   Current Outpatient Prescriptions on File Prior to Visit  Medication Sig Dispense Refill  . acetaminophen (TYLENOL) 500 MG tablet Take 1,500 mg by mouth 2 (two) times daily.     [name] amoxicillin-clavulanate (AUGMENTIN) 500-125 MG per tablet Take 1 tablet (500 mg total) by mouth 3 (three) times daily. 90 tablet  1  . benazepril (LOTENSIN) 40 MG tablet Take 1 tablet (40 mg total) by mouth daily. 90 tablet 3  . fenofibrate 160 MG tablet Take 1 tablet (160 mg total) by mouth at bedtime. 90 tablet 3  . polyethylene glycol (MIRALAX / GLYCOLAX) packet Take 17 g by mouth at bedtime. 14 each 0  . sodium chloride (OCEAN) 0.65 % nasal spray Place 1 spray into the nose daily as needed for congestion.     [name] terazosin (HYTRIN) 2 MG capsule Take 1 capsule (2 mg total) by mouth once. (Patient  taking differently: Take 2 mg by mouth daily. ) 90 capsule 3   No current facility-administered medications on file prior to visit.   Review of Systems Constitutional: Negative for increased diaphoresis, other activity, appetite or siginficant weight change other than noted HENT: Negative for worsening hearing loss, ear pain, facial swelling, mouth sores and neck stiffness.   Eyes: Negative for other worsening pain, redness or visual disturbance.  Respiratory: Negative for shortness of breath and wheezing  Cardiovascular: Negative for chest pain and palpitations.  Gastrointestinal: Negative for diarrhea, blood in stool, abdominal distention or other pain Genitourinary: Negative for hematuria, flank pain or change in urine volume.  Musculoskeletal: Negative for myalgias or other joint complaints.  Skin: Negative for color change and wound or drainage.  Neurological: Negative for syncope and numbness. other than noted Hematological: Negative for adenopathy. or other swelling Psychiatric/Behavioral: Negative for hallucinations, SI, self-injury, decreased concentration or other worsening agitation.      Objective:   Physical Exam BP 116/62 mmHg  Pulse 67  Temp(Src) 97.9 F (36.6 C) (Oral)  Ht 5\' 7"  (1.702 m)  Wt 159 lb (72.122 kg)  BMI 24.90 kg/m2  SpO2 97% VS noted,  Constitutional: Pt is oriented to person, place, and time. Appears well-developed and well-nourished, in no significant distress Head: Normocephalic and atraumatic.  Right Ear: External ear normal.  Left Ear: External ear normal.  Nose: Nose normal.  Mouth/Throat: Oropharynx is clear and moist.  Eyes: Conjunctivae and EOM are normal. Pupils are equal, round, and reactive to light.  Neck: Normal range of motion. Neck supple. No JVD present. No tracheal deviation present or significant neck LA or mass Cardiovascular: Normal rate, regular rhythm, normal heart sounds and intact distal pulses.   Pulmonary/Chest: Effort  normal and breath sounds without rales or wheezing  Abdominal: Soft. Bowel sounds are normal. NT. No HSM  Musculoskeletal: Normal range of motion. Exhibits no edema.  Lymphadenopathy:  Has no cervical adenopathy.  Neurological: Pt is alert and oriented to person, place, and time. Pt has normal reflexes. No cranial nerve deficit. Motor grossly intact Skin: Skin is warm and dry. No rash noted.  Psychiatric:  Has normal mood and affect. Behavior is normal.     Assessment & Plan:

## 2015-07-12 NOTE — Patient Instructions (Signed)
Please continue all other medications as before, and refills have been done if requested.  Please have the pharmacy call with any other refills you may need.  Please continue your efforts at being more active, low cholesterol diet, and weight control.  You are otherwise up to date with prevention measures today.  Please keep your appointments with your specialists as you may have planned  Please return in 6 months, or sooner if needed, with Lab testing done 3-5 days before  

## 2015-07-12 NOTE — Progress Notes (Signed)
Pre visit review using our clinic review tool, if applicable. No additional management support is needed unless otherwise documented below in the visit note. 

## 2015-07-27 ENCOUNTER — Ambulatory Visit (INDEPENDENT_AMBULATORY_CARE_PROVIDER_SITE_OTHER): Payer: Medicare Other | Admitting: Infectious Disease

## 2015-07-27 VITALS — BP 178/74 | HR 62 | Temp 98.5°F | Wt 161.5 lb

## 2015-07-27 DIAGNOSIS — A491 Streptococcal infection, unspecified site: Secondary | ICD-10-CM

## 2015-07-27 DIAGNOSIS — K8033 Calculus of bile duct with acute cholangitis with obstruction: Secondary | ICD-10-CM | POA: Diagnosis not present

## 2015-07-27 DIAGNOSIS — K750 Abscess of liver: Secondary | ICD-10-CM

## 2015-07-27 DIAGNOSIS — K8050 Calculus of bile duct without cholangitis or cholecystitis without obstruction: Secondary | ICD-10-CM

## 2015-07-27 NOTE — Progress Notes (Signed)
Chief complaint follow-up for hepatic abscess Subjective:    Patient ID: Neil [name], male    DOB: 01-05-1941, 74 y.o.   MRN: [id number]  HPI  74 year old Here for follow up. History of diverticulosis and choledocolithiasis s/p ERCP sphincterotomy, biliary stent placement in May 2016 and lap chole who developed RUQ pain and stone with stent removal in July.  He continued to have abdominal discomfort and found 8 x 8 x 10 cm multiloculated abscess and drain put in with Strep viridans.  He was placed on ceftriaxone and has been on it over 4 weeks.  Repeat CT done 8/31 noted improvement but not resolution, down to 3.3 x 4 cm.  Drain therefore was left in and continued on IV therapy.  Dr Linus Salmons discussed scan results with Dr. Pascal Lux and with little drainage coming out of drain, appears nearly resolved.  Some residual findings on CT.  patient voided 7 days of ceftriaxone additionally has been transitioned to Augmentin and taken this since then and done well. He still does have some residual bowel pain though this is improved.    Past Medical History  Diagnosis Date  . ALLERGIC RHINITIS 12/04/2007  . BENIGN PROSTATIC HYPERTROPHY 06/05/2007  . BUNDLE BRANCH BLOCK, RIGHT 07/21/2009  . BURSITIS, RIGHT SHOULDER 11/06/2010  . COLONIC POLYPS, HX OF 06/05/2007    tubular adenoma also 02/19/2013  . DIVERTICULOSIS, COLON 07/21/2009  . ERECTILE DYSFUNCTION 06/05/2007  . HYPERLIPIDEMIA 06/05/2007  . HYPERTENSION 06/05/2007  . INGUINAL HERNIA, RIGHT, SMALL 11/06/2010  . SKIN LESION 06/03/2008  . DIABETES MELLITUS, TYPE II 12/04/2007    pt states not diabetic-no meds 02-05-13 PV  . Gallstones   . Liver abscess 05/23/2015  . Hypotension   . Choledocholithiasis     Past Surgical History  Procedure Laterality Date  . Colonoscopy    . Polypectomy    . Hemorrhoid surgery    . Finger surgery Left   . Tonsillectomy    . Inguinal hernia repair Right     8'14 repair  . Ercp N/A 03/01/2015    Procedure: ENDOSCOPIC  RETROGRADE CHOLANGIOPANCREATOGRAPHY (ERCP);  Surgeon: Irene Shipper, MD;  Location: [facility];  Service: Endoscopy;  Laterality: N/A;  Dr Dalbert Batman wants patient to stay overnight after ERCP  . Cholecystectomy N/A 03/02/2015    Procedure: LAPAROSCOPIC CHOLECYSTECTOMY;  Surgeon: Fanny Skates, MD;  Location: [facility];  Service: General;  Laterality: N/A;  . Ercp N/A 05/03/2015    Procedure: ENDOSCOPIC RETROGRADE CHOLANGIOPANCREATOGRAPHY (ERCP);  Surgeon: Irene Shipper, MD;  Location: [facility];  Service: Endoscopy;  Laterality: N/A;    Family History  Problem Relation Age of Onset  . Colon cancer Mother 83  . Brain cancer Mother 12    mets from colon cancer  . Rectal cancer Neg Hx   . Stomach cancer Neg Hx       Social History   Social History  . Marital Status: Married    Spouse Name: N/A  . Number of Children: N/A  . Years of Education: N/A   Occupational History  . retired    Social History Main Topics  . Smoking status: Former Smoker    Quit date: 10/15/2005  . Smokeless tobacco: Never Used  . Alcohol Use: No  . Drug Use: No  . Sexual Activity: Not on file   Other Topics Concern  . Not on file   Social History Narrative    Allergies  Allergen Reactions  . Ezetimibe Other (See Comments)  Muscle and bone pain  . Statins     REACTION: muscle aches     Current outpatient prescriptions:  .  acetaminophen (TYLENOL) 500 MG tablet, Take 1,500 mg by mouth 2 (two) times daily. , Disp: , Rfl:  .  amoxicillin-clavulanate (AUGMENTIN) 500-125 MG per tablet, Take 1 tablet (500 mg total) by mouth 3 (three) times daily., Disp: 90 tablet, Rfl: 1 .  benazepril (LOTENSIN) 40 MG tablet, Take 1 tablet (40 mg total) by mouth daily., Disp: 90 tablet, Rfl: 3 .  fenofibrate 160 MG tablet, Take 1 tablet (160 mg total) by mouth at bedtime., Disp: 90 tablet, Rfl: 3 .  polyethylene glycol (MIRALAX / GLYCOLAX) packet, Take 17 g by mouth at bedtime., Disp: 14 each, Rfl: 0 .  sodium  chloride (OCEAN) 0.65 % nasal spray, Place 1 spray into the nose daily as needed for congestion. , Disp: , Rfl:  .  terazosin (HYTRIN) 2 MG capsule, Take 1 capsule (2 mg total) by mouth once. (Patient taking differently: Take 2 mg by mouth daily. ), Disp: 90 capsule, Rfl: 3  Review of Systems  Constitutional: Negative for fever, chills, diaphoresis, activity change, appetite change, fatigue and unexpected weight change.  HENT: Negative for congestion, rhinorrhea, sinus pressure, sneezing, sore throat and trouble swallowing.   Eyes: Negative for photophobia and visual disturbance.  Respiratory: Negative for cough, chest tightness, shortness of breath, wheezing and stridor.   Cardiovascular: Negative for chest pain, palpitations and leg swelling.  Gastrointestinal: Positive for abdominal pain. Negative for nausea, vomiting, diarrhea, constipation, blood in stool, abdominal distention and anal bleeding.  Genitourinary: Negative for dysuria, hematuria, flank pain and difficulty urinating.  Musculoskeletal: Negative for myalgias, back pain, joint swelling, arthralgias and gait problem.  Skin: Negative for color change, pallor, rash and wound.  Neurological: Negative for dizziness, tremors, weakness and light-headedness.  Hematological: Negative for adenopathy. Does not bruise/bleed easily.  Psychiatric/Behavioral: Negative for behavioral problems, confusion, sleep disturbance, dysphoric mood, decreased concentration and agitation.       Objective:   Physical Exam  Constitutional: He is oriented to person, place, and time. He appears well-developed and well-nourished. No distress.  HENT:  Head: Normocephalic and atraumatic.  Mouth/Throat: Oropharynx is clear and moist.  Eyes: Conjunctivae and EOM are normal. No scleral icterus.  Neck: Neck supple.  Cardiovascular: Normal rate and regular rhythm.   No murmur heard. Pulmonary/Chest: Effort normal. No respiratory distress.  Abdominal: Soft.  Bowel sounds are normal. He exhibits no distension. There is tenderness. There is no rebound and no guarding.  Musculoskeletal: Normal range of motion. He exhibits no edema or tenderness.  Neurological: He is alert and oriented to person, place, and time.  Skin: No rash noted.  Psychiatric: He has a normal mood and affect. His behavior is normal. Judgment and thought content normal.          Assessment & Plan:   Hepatic abscess: I want extend the Augmentin for an additional month and have him see [place] in 2 months time.  I spent greater than 15 minutes with the patient including greater than 50% of time in face to face counsel of the patient re his hepatic abscess and in coordination of their care.

## 2015-08-02 ENCOUNTER — Encounter: Payer: Self-pay | Admitting: Internal Medicine

## 2015-08-04 ENCOUNTER — Encounter: Payer: Self-pay | Admitting: Internal Medicine

## 2015-08-31 ENCOUNTER — Encounter: Payer: Self-pay | Admitting: Internal Medicine

## 2015-09-11 IMAGING — CT CT ABDOMEN W/ CM
3 of 4 series · 12 of 36 positions shown, 18 images · IV contrast ([ID] [id number])
Comparison: 05/29/2015.

CLINICAL DATA: Follow-up liver abscess. Cholecystectomy [REDACTED].
ERCP with stent removal 05/03/2015.

EXAM:
CT ABDOMEN WITH CONTRAST
TECHNIQUE: Multidetector CT imaging of the abdomen was performed using the
standard protocol following bolus administration of intravenous
contrast.
CONTRAST:  100mL MBWAZ1-EAA IOPAMIDOL (MBWAZ1-EAA) INJECTION 61%

[Series 3: abdomen with · axial · 0.76mm/px · z∈[-198,+2]mm · 6 of 57 slices shown, 11 images]
[im 9/57  soft-tissue]
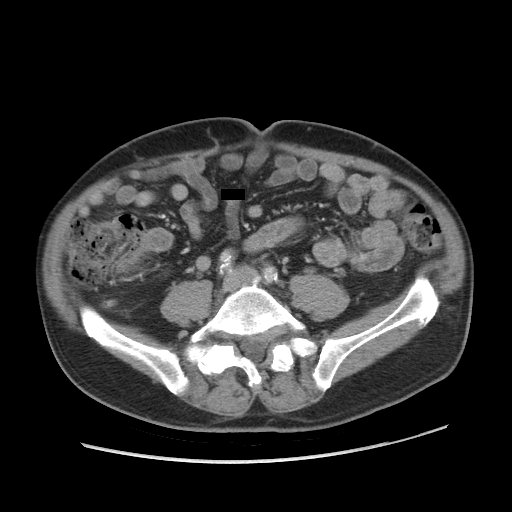
[im 9/57  bone]
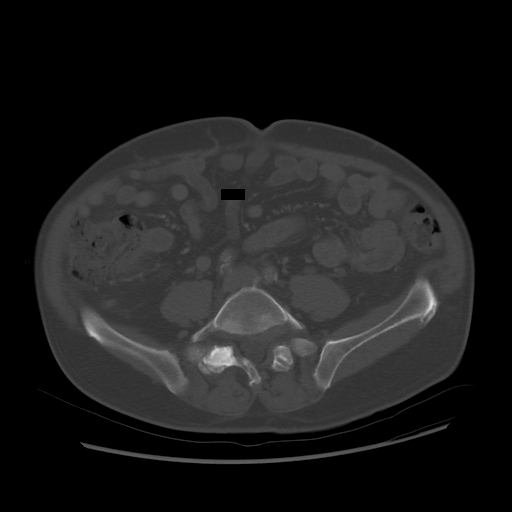
[im 17/57  soft-tissue]
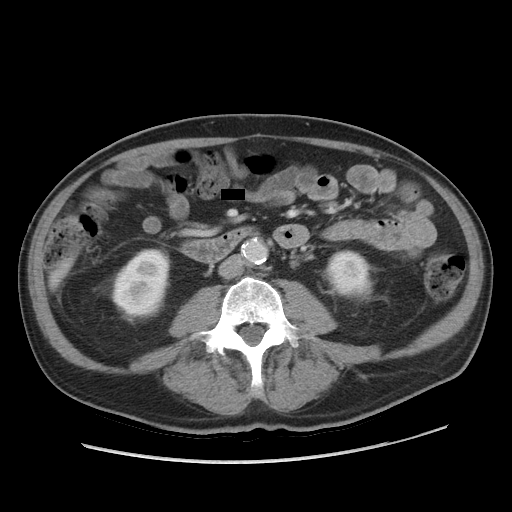
[im 25/57  soft-tissue]
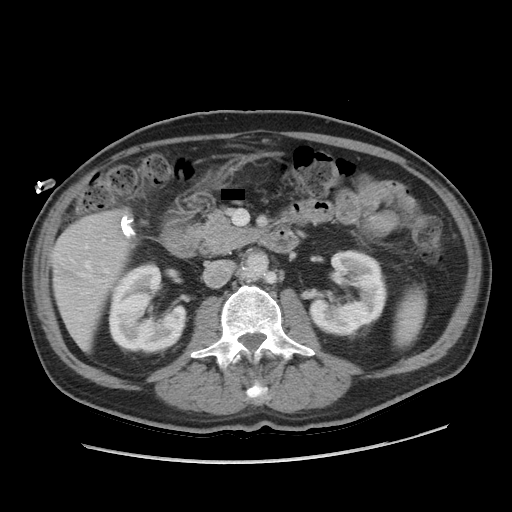
[im 25/57  lung]
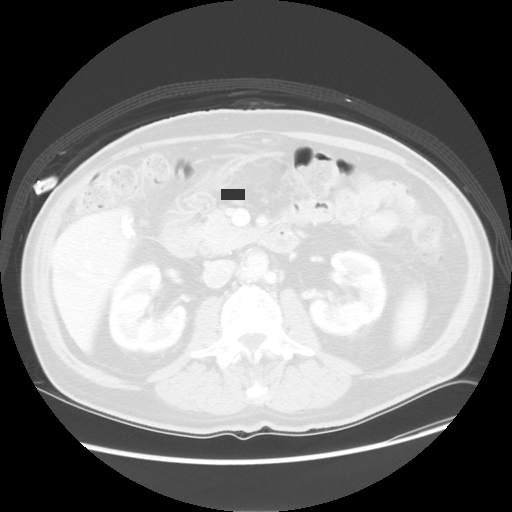
[im 33/57  soft-tissue]
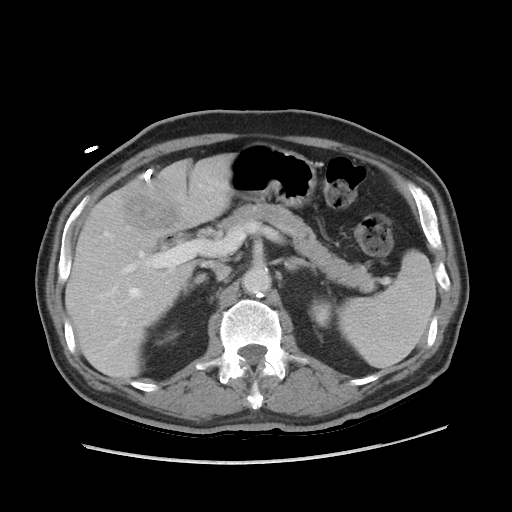
[im 33/57  lung]
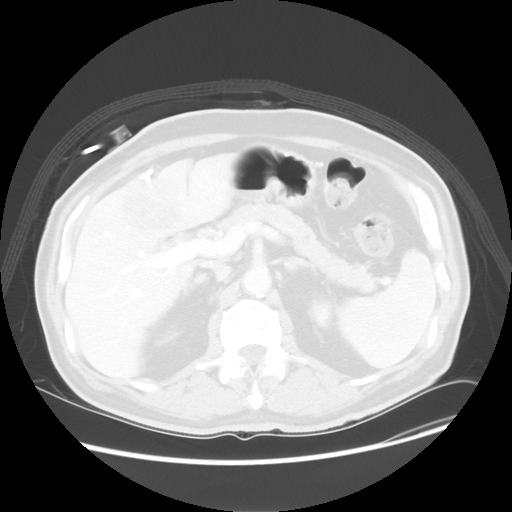
[im 41/57  soft-tissue]
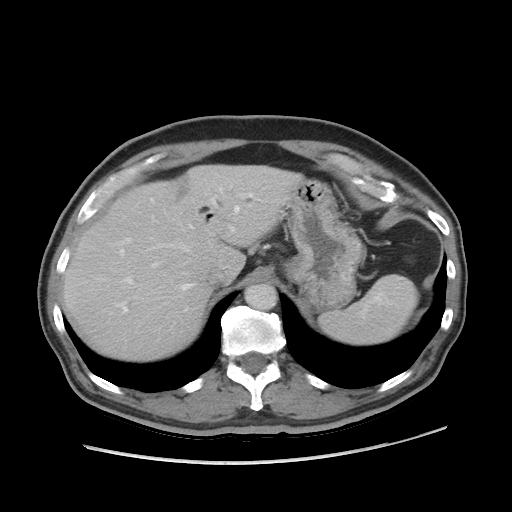
[im 41/57  lung]
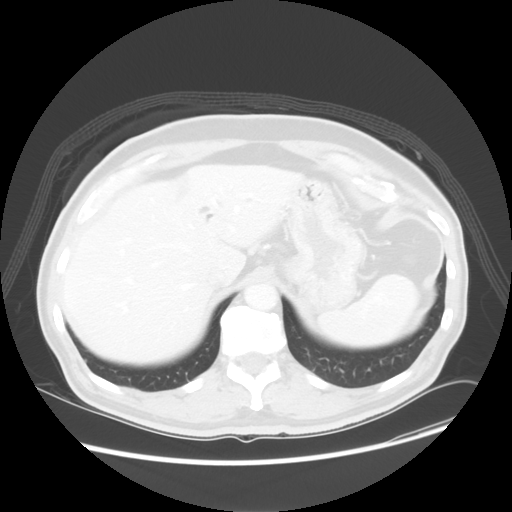
[im 49/57  soft-tissue]
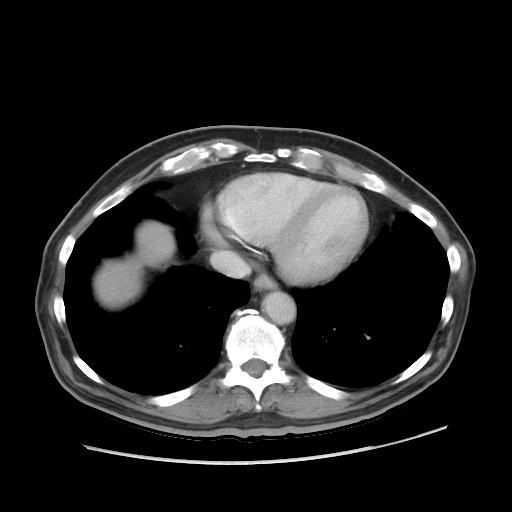
[im 49/57  lung]
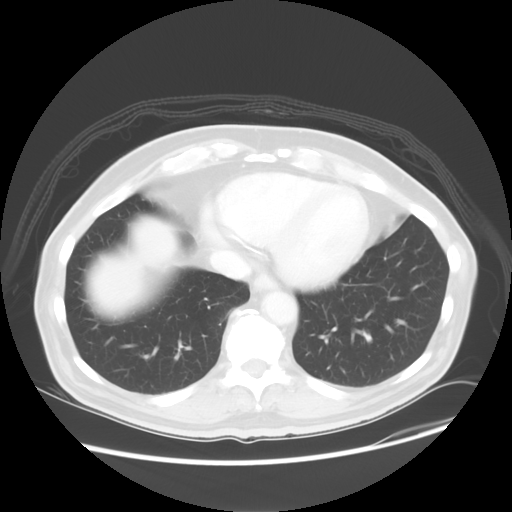

[Series 601: coronal body · coronal · 0.76mm/px · 1 of 106 slices shown, 2 images]
[im 36/106  soft-tissue]
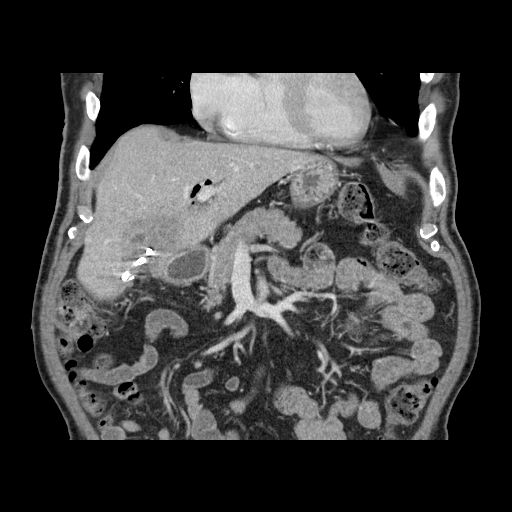
[im 36/106  bone]
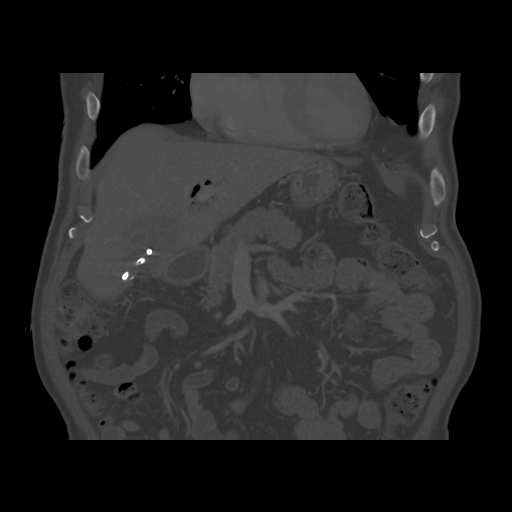

[Series 602: sagittal body · sagittal · 0.76mm/px · 5 of 147 slices shown]
[im 16/147  soft-tissue]
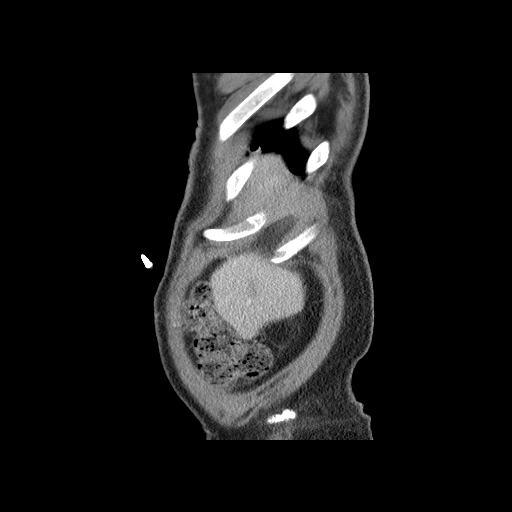
[im 31/147  soft-tissue]
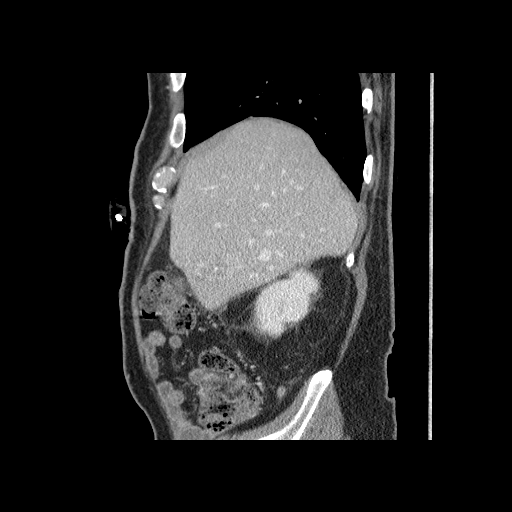
[im 47/147  soft-tissue]
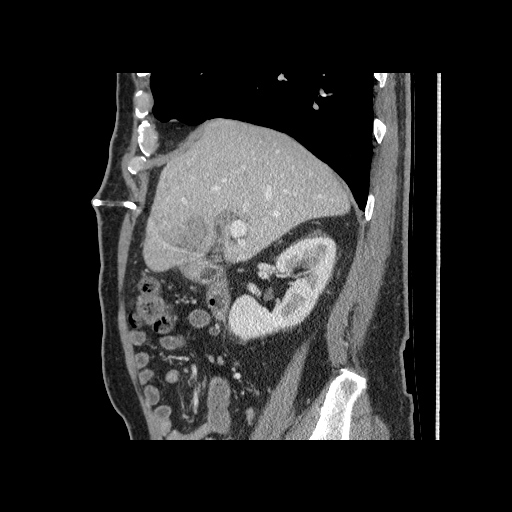
[im 62/147  soft-tissue]
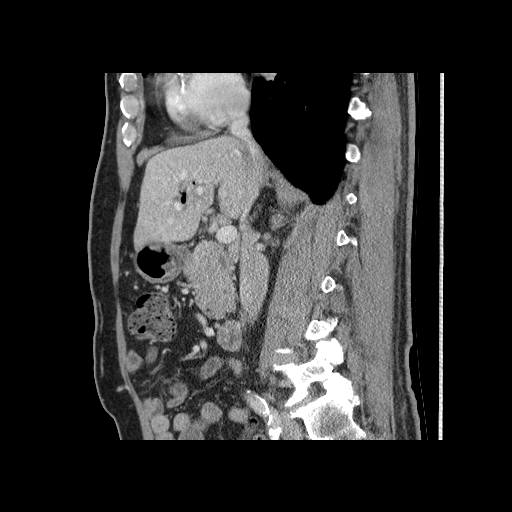
[im 85/147  soft-tissue]
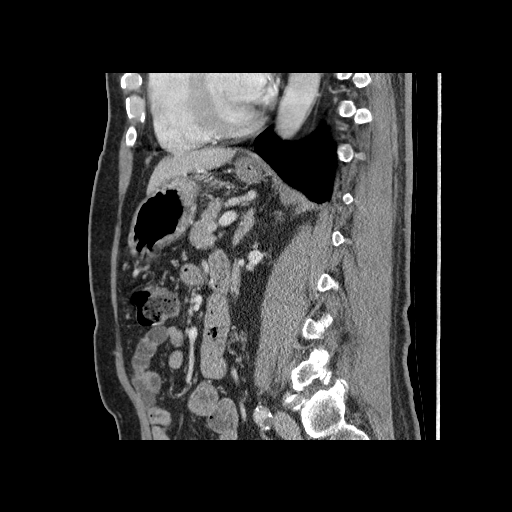

[12 of 36 positions shown; findings below may reference images not displayed]

FINDINGS: Lower chest: Lung bases show no acute findings. Coronary artery
calcification. Heart size normal. No pericardial effusion. Pre
pericardiac lymph nodes are sub cm in short axis size. No
pericardial effusion.

Hepatobiliary: A heterogeneous fluid collection in the left hepatic
lobe is seen with percutaneous drain in place, measuring 3.3 x
cm (previously 5.2 x 5.9 cm). Intermediate density lesion in the
inferior right hepatic lobe measures 1.2 cm (series 3, image 34),
likely stable and possibly focal fat. Focal fat along the falciform
ligament. Pneumobilia. Cholecystectomy.

Pancreas: Negative.

Spleen: Negative.

Adrenals/Urinary Tract: Adrenal glands and kidneys are unremarkable.
Visualized portions of the ureters are decompressed.

Stomach/Bowel: Stomach and visualized portions of the small bowel,
appendix and colon are unremarkable. Note is made that stool is seen
throughout the visualized colon.

Vascular/Lymphatic: Atherosclerotic calcification of the arterial
vasculature with the infrarenal aorta measuring up to 2.9 cm.
Retroaortic left renal vein. No pathologically enlarged lymph nodes.

Other: No free fluid.  Mesenteries and peritoneum are unremarkable.

Musculoskeletal: No worrisome lytic or sclerotic lesions.
IMPRESSION: 1. Interval improvement and left hepatic lobe abscess without
complete resolution. Percutaneous drain in place.
2. Coronary artery calcification.
3. Ectatic abdominal aorta at risk for aneurysm development.
Recommend followup by ultrasound in 5 years. This recommendation
follows ACR consensus guidelines: White Paper of the ACR [employer]

## 2015-10-16 HISTORY — PX: COLONOSCOPY: SHX174

## 2015-10-18 ENCOUNTER — Encounter: Payer: Self-pay | Admitting: Internal Medicine

## 2015-11-09 ENCOUNTER — Ambulatory Visit (INDEPENDENT_AMBULATORY_CARE_PROVIDER_SITE_OTHER): Payer: Medicare Other | Admitting: Infectious Disease

## 2015-11-09 ENCOUNTER — Encounter: Payer: Self-pay | Admitting: Infectious Disease

## 2015-11-09 VITALS — BP 146/75 | HR 61 | Temp 98.1°F | Wt 168.0 lb

## 2015-11-09 DIAGNOSIS — A491 Streptococcal infection, unspecified site: Secondary | ICD-10-CM

## 2015-11-09 DIAGNOSIS — K750 Abscess of liver: Secondary | ICD-10-CM | POA: Diagnosis not present

## 2015-11-09 DIAGNOSIS — K8050 Calculus of bile duct without cholangitis or cholecystitis without obstruction: Secondary | ICD-10-CM | POA: Diagnosis not present

## 2015-11-09 DIAGNOSIS — K8033 Calculus of bile duct with acute cholangitis with obstruction: Secondary | ICD-10-CM

## 2015-11-09 NOTE — Progress Notes (Signed)
Chief complaint follow-up for hepatic abscess Subjective:    Patient ID: Neil [name], male    DOB: 1940/12/16, 75 y.o.   MRN: [id number]  HPI  75 year old Here for follow up. History of diverticulosis and choledocolithiasis s/p ERCP sphincterotomy, biliary stent placement in May 2016 and lap chole who developed RUQ pain and stone with stent removal in July.  He continued to have abdominal discomfort and found 8 x 8 x 10 cm multiloculated abscess and drain put in with Strep viridans.  He was placed on ceftriaxone and has been on it over 4 weeks.  Repeat CT done 8/31 noted improvement but not resolution, down to 3.3 x 4 cm.  Drain therefore was left in and continued on IV therapy.  Dr Linus Salmons discussed scan results with Dr. Pascal Lux and with little drainage coming out of drain, appears nearly resolved.  Some residual findings on CT completed additional days of ceftriaxone additionally has been transitioned to Augmentin and taken this since then had done well. We extended his Augmentin for an additional month beyond the visit that I had with him on 08/02/2015. After he stopped his antibiotics he has remained off them for the ensuing months. He has not experienced return of chills or fevers or abdominal pain and has done very well.   Past Medical History  Diagnosis Date  . ALLERGIC RHINITIS 12/04/2007  . BENIGN PROSTATIC HYPERTROPHY 06/05/2007  . BUNDLE BRANCH BLOCK, RIGHT 07/21/2009  . BURSITIS, RIGHT SHOULDER 11/06/2010  . COLONIC POLYPS, HX OF 06/05/2007    tubular adenoma also 02/19/2013  . DIVERTICULOSIS, COLON 07/21/2009  . ERECTILE DYSFUNCTION 06/05/2007  . HYPERLIPIDEMIA 06/05/2007  . HYPERTENSION 06/05/2007  . INGUINAL HERNIA, RIGHT, SMALL 11/06/2010  . SKIN LESION 06/03/2008  . DIABETES MELLITUS, TYPE II 12/04/2007    pt states not diabetic-no meds 02-05-13 PV  . Gallstones   . Liver abscess 05/23/2015  . Hypotension   . Choledocholithiasis     Past Surgical History  Procedure Laterality Date    . Colonoscopy    . Polypectomy    . Hemorrhoid surgery    . Finger surgery Left   . Tonsillectomy    . Inguinal hernia repair Right     8'14 repair  . Ercp N/A 03/01/2015    Procedure: ENDOSCOPIC RETROGRADE CHOLANGIOPANCREATOGRAPHY (ERCP);  Surgeon: Irene Shipper, MD;  Location: [facility];  Service: Endoscopy;  Laterality: N/A;  Dr Dalbert Batman wants patient to stay overnight after ERCP  . Cholecystectomy N/A 03/02/2015    Procedure: LAPAROSCOPIC CHOLECYSTECTOMY;  Surgeon: Fanny Skates, MD;  Location: [facility];  Service: General;  Laterality: N/A;  . Ercp N/A 05/03/2015    Procedure: ENDOSCOPIC RETROGRADE CHOLANGIOPANCREATOGRAPHY (ERCP);  Surgeon: Irene Shipper, MD;  Location: [facility];  Service: Endoscopy;  Laterality: N/A;    Family History  Problem Relation Age of Onset  . Colon cancer Mother 41  . Brain cancer Mother 72    mets from colon cancer  . Rectal cancer Neg Hx   . Stomach cancer Neg Hx       Social History   Social History  . Marital Status: Married    Spouse Name: N/A  . Number of Children: N/A  . Years of Education: N/A   Occupational History  . retired    Social History Main Topics  . Smoking status: Former Smoker    Quit date: 10/15/2005  . Smokeless tobacco: Never Used  . Alcohol Use: No  . Drug Use: No  . Sexual  Activity: Not Asked   Other Topics Concern  . None   Social History Narrative    Allergies  Allergen Reactions  . Ezetimibe Other (See Comments)    Muscle and bone pain  . Statins     REACTION: muscle aches     Current outpatient prescriptions:  .  acetaminophen (TYLENOL) 500 MG tablet, Take 1,500 mg by mouth 2 (two) times daily. , Disp: , Rfl:  .  benazepril (LOTENSIN) 40 MG tablet, Take 1 tablet (40 mg total) by mouth daily., Disp: 90 tablet, Rfl: 3 .  fenofibrate 160 MG tablet, Take 1 tablet (160 mg total) by mouth at bedtime., Disp: 90 tablet, Rfl: 3 .  polyethylene glycol (MIRALAX / GLYCOLAX) packet, Take 17 g by mouth at  bedtime., Disp: 14 each, Rfl: 0 .  sodium chloride (OCEAN) 0.65 % nasal spray, Place 1 spray into the nose daily as needed for congestion. , Disp: , Rfl:  .  terazosin (HYTRIN) 2 MG capsule, Take 1 capsule (2 mg total) by mouth once. (Patient taking differently: Take 2 mg by mouth daily. ), Disp: 90 capsule, Rfl: 3  Review of Systems  Constitutional: Negative for fever, chills, diaphoresis, activity change, appetite change, fatigue and unexpected weight change.  HENT: Negative for congestion, rhinorrhea, sinus pressure, sneezing, sore throat and trouble swallowing.   Eyes: Negative for photophobia and visual disturbance.  Respiratory: Negative for cough, chest tightness, shortness of breath, wheezing and stridor.   Cardiovascular: Negative for chest pain, palpitations and leg swelling.  Gastrointestinal: Negative for nausea, vomiting, abdominal pain, diarrhea, constipation, blood in stool, abdominal distention and anal bleeding.  Genitourinary: Negative for dysuria, hematuria, flank pain and difficulty urinating.  Musculoskeletal: Negative for myalgias, back pain, joint swelling, arthralgias and gait problem.  Skin: Negative for color change, pallor, rash and wound.  Neurological: Negative for dizziness, tremors, weakness and light-headedness.  Hematological: Negative for adenopathy. Does not bruise/bleed easily.  Psychiatric/Behavioral: Negative for behavioral problems, confusion, sleep disturbance, dysphoric mood, decreased concentration and agitation.       Objective:   Physical Exam  Constitutional: He is oriented to person, place, and time. He appears well-developed and well-nourished. No distress.  HENT:  Head: Normocephalic and atraumatic.  Mouth/Throat: Oropharynx is clear and moist.  Eyes: Conjunctivae and EOM are normal. No scleral icterus.  Neck: Neck supple.  Cardiovascular: Normal rate and regular rhythm.   No murmur heard. Pulmonary/Chest: Effort normal. No respiratory  distress.  Abdominal: Soft. Bowel sounds are normal. He exhibits no distension and no mass. There is no tenderness. There is no rebound and no guarding.  Musculoskeletal: Normal range of motion. He exhibits no edema or tenderness.  Neurological: He is alert and oriented to person, place, and time.  Skin: No rash noted.  Psychiatric: He has a normal mood and affect. His behavior is normal. Judgment and thought content normal.          Assessment & Plan:   Hepatic abscess  He seems to have been cured clinically and has done well off antibiotics for the past 3 months. He can return to see [place] as needed. I've counseled him to be vigilant for symptoms of infection including abdominal pain fever chills malaise.

## 2015-11-22 ENCOUNTER — Telehealth: Payer: Self-pay

## 2015-11-22 NOTE — Telephone Encounter (Signed)
Home Health Cert/Plan of Care received and placed on MD's desk for signature 

## 2015-11-22 NOTE — Telephone Encounter (Signed)
Paperwork signed, faxed, copy sent to scan 

## 2015-12-09 ENCOUNTER — Other Ambulatory Visit: Payer: Self-pay | Admitting: Internal Medicine

## 2015-12-13 ENCOUNTER — Telehealth: Payer: Self-pay | Admitting: Internal Medicine

## 2015-12-13 DIAGNOSIS — Z0000 Encounter for general adult medical examination without abnormal findings: Secondary | ICD-10-CM

## 2015-12-13 NOTE — Telephone Encounter (Signed)
Patient has appointment coming up.  Wants labs entered before appt.

## 2015-12-13 NOTE — Telephone Encounter (Signed)
Please advise, patients appointment is 3/17 with you and would like his las to be entered ahead of time to have them done

## 2015-12-13 NOTE — Telephone Encounter (Signed)
Orders for lab work have been put in prior to annual visit

## 2015-12-14 DIAGNOSIS — H52223 Regular astigmatism, bilateral: Secondary | ICD-10-CM | POA: Diagnosis not present

## 2015-12-14 DIAGNOSIS — H5203 Hypermetropia, bilateral: Secondary | ICD-10-CM | POA: Diagnosis not present

## 2015-12-14 DIAGNOSIS — H524 Presbyopia: Secondary | ICD-10-CM | POA: Diagnosis not present

## 2015-12-14 DIAGNOSIS — H2513 Age-related nuclear cataract, bilateral: Secondary | ICD-10-CM | POA: Diagnosis not present

## 2015-12-23 ENCOUNTER — Other Ambulatory Visit (INDEPENDENT_AMBULATORY_CARE_PROVIDER_SITE_OTHER): Payer: Medicare Other

## 2015-12-23 ENCOUNTER — Telehealth: Payer: Self-pay

## 2015-12-23 DIAGNOSIS — Z0000 Encounter for general adult medical examination without abnormal findings: Secondary | ICD-10-CM

## 2015-12-23 LAB — URINALYSIS, ROUTINE W REFLEX MICROSCOPIC
Bilirubin Urine: NEGATIVE
Hgb urine dipstick: NEGATIVE
Ketones, ur: NEGATIVE
Leukocytes, UA: NEGATIVE
Nitrite: NEGATIVE
RBC / HPF: NONE SEEN (ref 0–?)
Specific Gravity, Urine: 1.02 (ref 1.000–1.030)
Total Protein, Urine: NEGATIVE
Urine Glucose: NEGATIVE
Urobilinogen, UA: 0.2 (ref 0.0–1.0)
WBC, UA: NONE SEEN (ref 0–?)
pH: 6 (ref 5.0–8.0)

## 2015-12-23 LAB — BASIC METABOLIC PANEL
BUN: 22 mg/dL (ref 6–23)
CO2: 28 mEq/L (ref 19–32)
Calcium: 9.7 mg/dL (ref 8.4–10.5)
Chloride: 107 mEq/L (ref 96–112)
Creatinine, Ser: 1.5 mg/dL (ref 0.40–1.50)
GFR: 48.67 mL/min — ABNORMAL LOW (ref >60.00)
Glucose, Bld: 121 mg/dL — ABNORMAL HIGH (ref 70–99)
Potassium: 4.3 mEq/L (ref 3.5–5.1)
Sodium: 143 mEq/L (ref 135–145)

## 2015-12-23 LAB — HEPATIC FUNCTION PANEL
ALT: 15 U/L (ref 0–53)
AST: 15 U/L (ref 0–37)
Albumin: 4.4 g/dL (ref 3.5–5.2)
Alkaline Phosphatase: 35 U/L — ABNORMAL LOW (ref 39–117)
Bilirubin, Direct: 0.1 mg/dL (ref 0.0–0.3)
Total Bilirubin: 0.4 mg/dL (ref 0.2–1.2)
Total Protein: 7.3 g/dL (ref 6.0–8.3)

## 2015-12-23 LAB — CBC WITH DIFFERENTIAL/PLATELET
Basophils Absolute: 0.1 10*3/uL (ref 0.0–0.1)
Basophils Relative: 0.7 % (ref 0.0–3.0)
Eosinophils Absolute: 4.2 10*3/uL — ABNORMAL HIGH (ref 0.0–0.7)
Eosinophils Relative: 31.9 % — ABNORMAL HIGH (ref 0.0–5.0)
HCT: 42.8 % (ref 39.0–52.0)
Hemoglobin: 14.4 g/dL (ref 13.0–17.0)
Lymphocytes Relative: 18.8 % (ref 12.0–46.0)
Lymphs Abs: 2.5 10*3/uL (ref 0.7–4.0)
MCHC: 33.6 g/dL (ref 30.0–36.0)
MCV: 87.1 fl (ref 78.0–100.0)
Monocytes Absolute: 0.8 10*3/uL (ref 0.1–1.0)
Monocytes Relative: 6.1 % (ref 3.0–12.0)
Neutro Abs: 5.6 10*3/uL (ref 1.4–7.7)
Neutrophils Relative %: 42.5 % — ABNORMAL LOW (ref 43.0–77.0)
Platelets: 217 10*3/uL (ref 150.0–400.0)
RBC: 4.91 Mil/uL (ref 4.22–5.81)
RDW: 14.3 % (ref 11.5–15.5)
WBC: 13.2 10*3/uL — ABNORMAL HIGH (ref 4.0–10.5)

## 2015-12-23 LAB — TSH: TSH: 0.55 u[IU]/mL (ref 0.35–4.50)

## 2015-12-23 LAB — HEMOGLOBIN A1C: Hgb A1c MFr Bld: 6.2 % (ref 4.6–6.5)

## 2015-12-23 LAB — PSA: PSA: 2.46 ng/mL (ref 0.10–4.00)

## 2015-12-23 NOTE — Telephone Encounter (Signed)
New orders sent

## 2015-12-30 ENCOUNTER — Ambulatory Visit (INDEPENDENT_AMBULATORY_CARE_PROVIDER_SITE_OTHER): Payer: Medicare Other | Admitting: Internal Medicine

## 2015-12-30 ENCOUNTER — Encounter: Payer: Self-pay | Admitting: Internal Medicine

## 2015-12-30 ENCOUNTER — Other Ambulatory Visit (INDEPENDENT_AMBULATORY_CARE_PROVIDER_SITE_OTHER): Payer: Medicare Other

## 2015-12-30 VITALS — BP 132/68 | HR 66 | Temp 98.3°F | Ht 67.0 in | Wt 172.0 lb

## 2015-12-30 DIAGNOSIS — D721 Eosinophilia, unspecified: Secondary | ICD-10-CM | POA: Insufficient documentation

## 2015-12-30 DIAGNOSIS — I10 Essential (primary) hypertension: Secondary | ICD-10-CM

## 2015-12-30 DIAGNOSIS — Z0000 Encounter for general adult medical examination without abnormal findings: Secondary | ICD-10-CM

## 2015-12-30 DIAGNOSIS — R739 Hyperglycemia, unspecified: Secondary | ICD-10-CM

## 2015-12-30 DIAGNOSIS — E785 Hyperlipidemia, unspecified: Secondary | ICD-10-CM | POA: Diagnosis not present

## 2015-12-30 LAB — CBC WITH DIFFERENTIAL/PLATELET
Basophils Absolute: 0 K/uL (ref 0.0–0.1)
Basophils Relative: 0.4 % (ref 0.0–3.0)
Eosinophils Absolute: 4 K/uL — ABNORMAL HIGH (ref 0.0–0.7)
Eosinophils Relative: 31.4 % — ABNORMAL HIGH (ref 0.0–5.0)
HCT: 43.7 % (ref 39.0–52.0)
Hemoglobin: 14.4 g/dL (ref 13.0–17.0)
Lymphocytes Relative: 17.7 % (ref 12.0–46.0)
Lymphs Abs: 2.2 K/uL (ref 0.7–4.0)
MCHC: 32.9 g/dL (ref 30.0–36.0)
MCV: 87.4 fl (ref 78.0–100.0)
Monocytes Absolute: 1.1 K/uL — ABNORMAL HIGH (ref 0.1–1.0)
Monocytes Relative: 8.7 % (ref 3.0–12.0)
Neutro Abs: 5.3 K/uL (ref 1.4–7.7)
Neutrophils Relative %: 41.8 % — ABNORMAL LOW (ref 43.0–77.0)
Platelets: 221 K/uL (ref 150.0–400.0)
RBC: 5 Mil/uL (ref 4.22–5.81)
RDW: 13.9 % (ref 11.5–15.5)
WBC: 12.6 K/uL — ABNORMAL HIGH (ref 4.0–10.5)

## 2015-12-30 MED ORDER — BENAZEPRIL HCL 40 MG PO TABS: 40.0000 mg | ORAL_TABLET | Freq: Every day | ORAL | Status: DC

## 2015-12-30 MED ORDER — TERAZOSIN HCL 2 MG PO CAPS: 2.0000 mg | ORAL_CAPSULE | Freq: Once | ORAL | Status: DC

## 2015-12-30 MED ORDER — FENOFIBRATE 160 MG PO TABS: ORAL_TABLET | ORAL | Status: DC

## 2015-12-30 NOTE — Progress Notes (Signed)
Subjective:  HPI:  Patient ID: Neil [name], male    DOB: [birth year], 75 y.o.   MRN: [id number]  Here for wellness and f/u;  Overall doing ok;  Pt denies Chest pain, worsening SOB, DOE, wheezing, orthopnea, PND, worsening LE edema, palpitations, dizziness or syncope.  Pt denies neurological change such as new headache, facial or extremity weakness.  Pt denies polydipsia, polyuria, or low sugar symptoms. Pt states overall good compliance with treatment and medications, good tolerability, and has been trying to follow appropriate diet.  Pt denies worsening depressive symptoms, suicidal ideation or panic. No fever, night sweats, wt loss, loss of appetite, or other constitutional symptoms.  Pt states good ability with ADL's, has low fall risk, home safety reviewed and adequate, no other significant changes in hearing or vision, and occasionally active with exercise. Does have several wks ongoing nasal allergy symptoms with clearish congestion, itch and sneezing, without fever, pain, ST, cough, swelling or wheezing. Due for AAA screen, colonoscopy  Past Medical History  Diagnosis Date  . ALLERGIC RHINITIS 12/04/2007  . BENIGN PROSTATIC HYPERTROPHY 06/05/2007  . BUNDLE BRANCH BLOCK, RIGHT 07/21/2009  . BURSITIS, RIGHT SHOULDER 11/06/2010  . COLONIC POLYPS, HX OF 06/05/2007    tubular adenoma also 02/19/2013  . DIVERTICULOSIS, COLON 07/21/2009  . ERECTILE DYSFUNCTION 06/05/2007  . HYPERLIPIDEMIA 06/05/2007  . HYPERTENSION 06/05/2007  . INGUINAL HERNIA, RIGHT, SMALL 11/06/2010  . SKIN LESION 06/03/2008  . DIABETES MELLITUS, TYPE II 12/04/2007    pt states not diabetic-no meds 02-05-13 PV  . Gallstones   . Liver abscess 05/23/2015  . Hypotension   . Choledocholithiasis    Past Surgical History  Procedure Laterality Date  . Colonoscopy    . Polypectomy    . Hemorrhoid surgery    . Finger surgery Left   . Tonsillectomy    . Inguinal hernia repair Right     8'14 repair  . Ercp N/A 03/01/2015    Procedure:  ENDOSCOPIC RETROGRADE CHOLANGIOPANCREATOGRAPHY (ERCP);  Surgeon: Irene Shipper, MD;  Location: [facility];  Service: Endoscopy;  Laterality: N/A;  Dr Dalbert Batman wants patient to stay overnight after ERCP  . Cholecystectomy N/A 03/02/2015    Procedure: LAPAROSCOPIC CHOLECYSTECTOMY;  Surgeon: Fanny Skates, MD;  Location: [facility];  Service: General;  Laterality: N/A;  . Ercp N/A 05/03/2015    Procedure: ENDOSCOPIC RETROGRADE CHOLANGIOPANCREATOGRAPHY (ERCP);  Surgeon: Irene Shipper, MD;  Location: [facility];  Service: Endoscopy;  Laterality: N/A;    reports that he quit smoking about 10 years ago. He has never used smokeless tobacco. He reports that he does not drink alcohol or use illicit drugs. family history includes Brain cancer (age of onset: 18) in his mother; Colon cancer (age of onset: 32) in his mother. There is no history of Rectal cancer or Stomach cancer. Allergies  Allergen Reactions  . Ezetimibe Other (See Comments)    Muscle and bone pain  . Statins     REACTION: muscle aches   Current Outpatient Prescriptions on File Prior to Visit  Medication Sig Dispense Refill  . acetaminophen (TYLENOL) 500 MG tablet Take 1,500 mg by mouth 2 (two) times daily.     . polyethylene glycol (MIRALAX / GLYCOLAX) packet Take 17 g by mouth at bedtime. 14 each 0  . sodium chloride (OCEAN) 0.65 % nasal spray Place 1 spray into the nose daily as needed for congestion.      No current facility-administered medications on file prior to visit.    HPI  Review of Systems Constitutional: Negative for increased diaphoresis, other activity, appetite or siginficant weight change other than noted HENT: Negative for worsening hearing loss, ear pain, facial swelling, mouth sores and neck stiffness.   Eyes: Negative for other worsening pain, redness or visual disturbance.  Respiratory: Negative for shortness of breath and wheezing  Cardiovascular: Negative for chest pain and palpitations.  Gastrointestinal:  Negative for diarrhea, blood in stool, abdominal distention or other pain Genitourinary: Negative for hematuria, flank pain or change in urine volume.  Musculoskeletal: Negative for myalgias or other joint complaints.  Skin: Negative for color change and wound or drainage.  Neurological: Negative for syncope and numbness. other than noted Hematological: Negative for adenopathy. or other swelling Psychiatric/Behavioral: Negative for hallucinations, SI, self-injury, decreased concentration or other worsening agitation.      Objective:   Physical Exam BP 132/68 mmHg  Pulse 66  Temp(Src) 98.3 F (36.8 C) (Oral)  Ht 5\' 7"  (1.702 m)  Wt 172 lb (78.019 kg)  BMI 26.93 kg/m2  SpO2 94% VS noted,  Constitutional: Pt is oriented to person, place, and time. Appears well-developed and well-nourished, in no significant distress Head: Normocephalic and atraumatic.  Right Ear: External ear normal.  Left Ear: External ear normal.  Nose: Nose normal.  Mouth/Throat: Oropharynx is clear and moist.  Eyes: Conjunctivae and EOM are normal. Pupils are equal, round, and reactive to light.  Neck: Normal range of motion. Neck supple. No JVD present. No tracheal deviation present or significant neck LA or mass Cardiovascular: Normal rate, regular rhythm, normal heart sounds and intact distal pulses.   Pulmonary/Chest: Effort normal and breath sounds without rales or wheezing  Abdominal: Soft. Bowel sounds are normal. NT. No HSM  Musculoskeletal: Normal range of motion. Exhibits no edema.  Lymphadenopathy:  Has no cervical adenopathy.  Neurological: Pt is alert and oriented to person, place, and time. Pt has normal reflexes. No cranial nerve deficit. Motor grossly intact Skin: Skin is warm and dry. No rash noted.  Psychiatric:  Has normal mood and affect. Behavior is normal.     Assessment & Plan:

## 2015-12-30 NOTE — Patient Instructions (Addendum)
Please continue all other medications as before, and refills have been done if requested.  Please have the pharmacy call with any other refills you may need.  Please continue your efforts at being more active, low cholesterol diet, and weight control.  You are otherwise up to date with prevention measures today.  Please keep your appointments with your specialists as you may have planned  You will be contacted regarding the referral for: AAA screen (ultrasound), and the colonoscopy with Dr Henrene Pastor  Please go to the LAB in the Basement (turn left off the elevator) for the tests to be done today- just the CBC today due to the high eosinophils  If this is elevated again, we would most likely try to refer you to Hematology  Please return in 6 months, or sooner if needed, with Lab testing done 3-5 days before

## 2015-12-30 NOTE — Progress Notes (Signed)
Pre visit review using our clinic review tool, if applicable. No additional management support is needed unless otherwise documented below in the visit note. 

## 2015-12-31 NOTE — Assessment & Plan Note (Signed)

## 2015-12-31 NOTE — Assessment & Plan Note (Signed)
stable overall by history and exam, recent data reviewed with pt, and pt to continue medical treatment as before,  to f/u any worsening symptoms or concerns BP Readings from Last 3 Encounters:  12/30/15 132/68  11/09/15 146/75  07/27/15 178/74

## 2015-12-31 NOTE — Assessment & Plan Note (Signed)
Rather dramatic, asympt, for repeat lab, consider hematology referal for further evalution

## 2015-12-31 NOTE — Assessment & Plan Note (Signed)
stable overall by history and exam, recent data reviewed with pt, and pt to continue medical treatment as before,  to f/u any worsening symptoms or concerns Lab Results  Component Value Date   HGBA1C 6.2 12/23/2015

## 2015-12-31 NOTE — Assessment & Plan Note (Signed)
stable overall by history and exam, recent data reviewed with pt, and pt to continue medical treatment as before,  to f/u any worsening symptoms or concerns Lab Results  Component Value Date   LDLCALC 93 07/08/2015    

## 2016-01-03 ENCOUNTER — Other Ambulatory Visit: Payer: Self-pay | Admitting: Internal Medicine

## 2016-01-03 ENCOUNTER — Encounter: Payer: Self-pay | Admitting: Internal Medicine

## 2016-01-03 ENCOUNTER — Encounter: Payer: Self-pay | Admitting: Hematology

## 2016-01-03 ENCOUNTER — Other Ambulatory Visit: Payer: Self-pay | Admitting: Acute Care

## 2016-01-03 ENCOUNTER — Telehealth: Payer: Self-pay | Admitting: Hematology

## 2016-01-03 DIAGNOSIS — D721 Eosinophilia, unspecified: Secondary | ICD-10-CM

## 2016-01-03 DIAGNOSIS — Z87891 Personal history of nicotine dependence: Secondary | ICD-10-CM

## 2016-01-03 NOTE — Telephone Encounter (Signed)
Sent out new patient packet to pt via mail and faxed new pt ref letter

## 2016-01-06 ENCOUNTER — Other Ambulatory Visit: Payer: Self-pay | Admitting: Internal Medicine

## 2016-01-06 DIAGNOSIS — Z136 Encounter for screening for cardiovascular disorders: Secondary | ICD-10-CM

## 2016-01-13 ENCOUNTER — Ambulatory Visit (HOSPITAL_COMMUNITY)
Admission: RE | Admit: 2016-01-13 | Discharge: 2016-01-13 | Disposition: A | Discharge status: Home / Self Care | Payer: Medicare Other | Source: Ambulatory Visit | Attending: Cardiology | Admitting: Cardiology

## 2016-01-13 DIAGNOSIS — I10 Essential (primary) hypertension: Secondary | ICD-10-CM | POA: Diagnosis not present

## 2016-01-13 DIAGNOSIS — E785 Hyperlipidemia, unspecified: Secondary | ICD-10-CM | POA: Insufficient documentation

## 2016-01-13 DIAGNOSIS — I708 Atherosclerosis of other arteries: Secondary | ICD-10-CM | POA: Insufficient documentation

## 2016-01-13 DIAGNOSIS — Z136 Encounter for screening for cardiovascular disorders: Secondary | ICD-10-CM | POA: Diagnosis not present

## 2016-01-13 DIAGNOSIS — I700 Atherosclerosis of aorta: Secondary | ICD-10-CM | POA: Insufficient documentation

## 2016-01-13 DIAGNOSIS — E119 Type 2 diabetes mellitus without complications: Secondary | ICD-10-CM | POA: Insufficient documentation

## 2016-01-17 ENCOUNTER — Ambulatory Visit: Payer: Medicare Other | Admitting: Hematology

## 2016-01-17 ENCOUNTER — Other Ambulatory Visit: Payer: Self-pay | Admitting: Internal Medicine

## 2016-01-17 DIAGNOSIS — I739 Peripheral vascular disease, unspecified: Secondary | ICD-10-CM

## 2016-01-18 ENCOUNTER — Telehealth: Payer: Self-pay | Admitting: Hematology

## 2016-01-18 ENCOUNTER — Encounter: Payer: Self-pay | Admitting: Hematology

## 2016-01-18 ENCOUNTER — Ambulatory Visit (HOSPITAL_BASED_OUTPATIENT_CLINIC_OR_DEPARTMENT_OTHER): Payer: Medicare Other | Admitting: Hematology

## 2016-01-18 VITALS — BP 163/60 | HR 77 | Temp 98.3°F | Resp 19 | Ht 67.0 in | Wt 173.0 lb

## 2016-01-18 DIAGNOSIS — D721 Eosinophilia, unspecified: Secondary | ICD-10-CM

## 2016-01-18 DIAGNOSIS — E119 Type 2 diabetes mellitus without complications: Secondary | ICD-10-CM

## 2016-01-18 DIAGNOSIS — I10 Essential (primary) hypertension: Secondary | ICD-10-CM | POA: Diagnosis not present

## 2016-01-18 DIAGNOSIS — Z800 Family history of malignant neoplasm of digestive organs: Secondary | ICD-10-CM

## 2016-01-18 DIAGNOSIS — Z87891 Personal history of nicotine dependence: Secondary | ICD-10-CM

## 2016-01-18 DIAGNOSIS — Z808 Family history of malignant neoplasm of other organs or systems: Secondary | ICD-10-CM

## 2016-01-18 NOTE — Progress Notes (Signed)
[name]    HEMATOLOGY/ONCOLOGY CONSULTATION NOTE  Date of Service: 01/18/2016  Patient Care Team: Biagio Borg, MD as PCP - General  CHIEF COMPLAINTS/PURPOSE OF CONSULTATION:  New eosinophilia  HISTORY OF PRESENTING ILLNESS:   Neil [name] is a wonderful 75 y.o. male who has been referred to [place] by Dr [name], MD for evaluation and management of newly noted significant eosinophilia.  Patient has a history of allergic rhinitis (mild), BPH, hypertension, dyslipidemia, diabetes type 2 with notes he had significant issues with his hepatobiliary system last year.  He notes that he had large gallstones and choledocholithiasis with multiple large stones requiring an ERCP with biliary sphincterotomy basket and balloon extraction of multiple calculi and subsequent biliary stent placement for residual calculi on 03/01/2015. He subsequently had a laparoscopic cholecystectomy on 03/02/2015. He subsequently had biliary stent removal followed by balloon clearance of his CBD duct stones in May 02 2016.  On 05/22/2016 patient was admitted with shaking chills fevers and sweats and hypotension and recurrent right upper quadrant abdominal pain. 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 He had the abscesses drained by catheter placed by interventional radiology and was initially treated with IV Zosyn. Cultures grew out moderate strep viridans and lactobacillus. Blood cultures were negative. Patient had a PICC line placed and was discharged on ceftriaxone 2 g daily IV. Repeat imaging with a CT on 06/29/2015 showed interval decrease in size to 3.4 cm no new definable/drainable hepatic abscess. Drainage catheter was removed at that time. Patient was being followed by Dr .Linus Salmons from infectious disease.  Patient notes that he has been doing well since then and has had no other acute issues. No abdominal pain/fever/chills/night sweats/weight loss/loss of appetite. No  chest pain or shortness of breath. Overall has been feeling quite well.  He was seen for routine follow-up by his primary care physician Dr. [name] on 12/30/2015 and had routine labs which showed a WBC count of 12.6k with 4k that his 31% eosinophils. Previous labs on 12/23/2015 also showed 4.2k eosinophils. Prior to that his available labs from 6 months ago showed normal eosinophil counts. He is current CBC shows a normal hemoglobin of 14.4 with normal platelets of 221k. He notes no new enlarged lymph nodes. No abdominal pain.  Has had bad nasal allergies about a month ago but does not feel these are different than in previous years. No new medications since September 2016. No other acute new focal symptoms. No recent history of travel. No diarrhea. No skin rashes. No new bone pains. No pets at home. No other overt chemical exposures.  Notes that given his history of colonic polyps he is due for a repeat colonoscopy this year in May 2017. No urinary symptoms.  MEDICAL HISTORY:  Past Medical History  Diagnosis Date  . ALLERGIC RHINITIS 12/04/2007  . BENIGN PROSTATIC HYPERTROPHY 06/05/2007  . BUNDLE BRANCH BLOCK, RIGHT 07/21/2009  . BURSITIS, RIGHT SHOULDER 11/06/2010  . COLONIC POLYPS, HX OF 06/05/2007    tubular adenoma also 02/19/2013  . DIVERTICULOSIS, COLON 07/21/2009  . ERECTILE DYSFUNCTION 06/05/2007  . HYPERLIPIDEMIA 06/05/2007  . HYPERTENSION 06/05/2007  . INGUINAL HERNIA, RIGHT, SMALL 11/06/2010  . SKIN LESION 06/03/2008  . DIABETES MELLITUS, TYPE II 12/04/2007    pt states not diabetic-no meds 02-05-13 PV  . Gallstones   . Liver abscess 05/23/2015  . Hypotension   . Choledocholithiasis     SURGICAL HISTORY: Past Surgical History  Procedure Laterality Date  . Colonoscopy    . Polypectomy    . Hemorrhoid surgery    . Finger surgery Left   . Tonsillectomy    . Inguinal hernia repair Right     8'14 repair  . Ercp N/A 03/01/2015    Procedure: ENDOSCOPIC RETROGRADE  CHOLANGIOPANCREATOGRAPHY (ERCP);  Surgeon: Irene Shipper, MD;  Location: [facility];  Service: Endoscopy;  Laterality: N/A;  Dr Dalbert Batman wants patient to stay overnight after ERCP  . Cholecystectomy N/A 03/02/2015    Procedure: LAPAROSCOPIC CHOLECYSTECTOMY;  Surgeon: Fanny Skates, MD;  Location: [facility];  Service: General;  Laterality: N/A;  . Ercp N/A 05/03/2015    Procedure: ENDOSCOPIC RETROGRADE CHOLANGIOPANCREATOGRAPHY (ERCP);  Surgeon: Irene Shipper, MD;  Location: [facility];  Service: Endoscopy;  Laterality: N/A;    SOCIAL HISTORY: Social History   Social History  . Marital Status: Married    Spouse Name: N/A  . Number of Children: N/A  . Years of Education: N/A   Occupational History  . retired    Social History Main Topics  . Smoking status: Former Smoker    Quit date: 10/15/2005  . Smokeless tobacco: Never Used  . Alcohol Use: No  . Drug Use: No  . Sexual Activity: Not on file   Other Topics Concern  . 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 Colon cancer Mother 68  . Brain cancer Mother 53    mets from colon cancer  . Rectal cancer Neg Hx   . Stomach cancer Neg Hx     ALLERGIES:  is allergic to ezetimibe and statins.  MEDICATIONS:  Current Outpatient Prescriptions  Medication Sig Dispense Refill  . acetaminophen (TYLENOL) 500 MG tablet Take 1,500 mg by mouth 2 (two) times daily.     . Alpha-D-Galactosidase (BEANO PO) Take by mouth.    . benazepril (LOTENSIN) 40 MG tablet Take 1 tablet (40 mg total) by mouth daily. 90 tablet 3  . cholecalciferol (VITAMIN D) 1000 units tablet Take 1,000 Units by mouth daily.    . fenofibrate 160 MG tablet Take 1 tablet by mouth at  bedtime 90 tablet 3  . polyethylene glycol (MIRALAX / GLYCOLAX) packet Take 17 g by mouth at bedtime. 14 each 0  . sodium chloride (OCEAN) 0.65 % nasal spray Place 1 spray into  the nose daily as needed for congestion.     [name] terazosin (HYTRIN) 2 MG capsule Take 1 capsule (2 mg total) by mouth once. 90 capsule 3   No current facility-administered medications for this visit.    REVIEW OF SYSTEMS:    10 Point review of Systems was done is negative except as noted above.  PHYSICAL EXAMINATION: ECOG PERFORMANCE STATUS: 1 - Symptomatic but completely ambulatory  . Filed Vitals:   01/18/16 1519  BP: 163/60  Pulse: 77  Temp: 98.3 F (36.8 C)  Resp: 19   Filed Weights   01/18/16 1519  Weight: 173 lb (78.472 kg)   .Body mass index is 27.09 kg/(m^2).  GENERAL:alert, in no acute distress and comfortable SKIN: skin color, texture, turgor are normal, no rashes or significant lesions EYES: normal, conjunctiva are pink and non-injected, sclera clear OROPHARYNX:no exudate, no erythema and lips, buccal mucosa, and tongue normal  NECK: supple, no JVD, thyroid normal size, non-tender, without nodularity LYMPH:  no palpable lymphadenopathy in the cervical, axillary  or inguinal LUNGS: clear to auscultation with normal respiratory effort HEART: regular rate & rhythm,  no murmurs and bilateral trace edema ABDOMEN: abdomen soft, non-tender, normoactive bowel sounds , no hepatosplenomegaly Musculoskeletal: no cyanosis of digits and no clubbing  PSYCH: alert & oriented x 3 with fluent speech NEURO: no focal motor/sensory deficits  LABORATORY DATA:  I have reviewed the data as listed  . CBC Latest Ref Rng 12/30/2015 12/23/2015 07/08/2015  WBC 4.0 - 10.5 K/uL 12.6(H) 13.2(H) 4.9  Hemoglobin 13.0 - 17.0 g/dL 14.4 14.4 13.2  Hematocrit 39.0 - 52.0 % 43.7 42.8 40.3  Platelets 150.0 - 400.0 K/uL 221.0 217.0 204.0   . CBC    Component Value Date/Time   WBC 12.6* 12/30/2015 1707   RBC 5.00 12/30/2015 1707   HGB 14.4 12/30/2015 1707   HCT 43.7 12/30/2015 1707   PLT 221.0 12/30/2015 1707   MCV 87.4 12/30/2015 1707   MCH 27.0 05/26/2015 0510   MCHC 32.9 12/30/2015 1707     RDW 13.9 12/30/2015 1707   LYMPHSABS 2.2 12/30/2015 1707   MONOABS 1.1* 12/30/2015 1707   EOSABS 4.0* 12/30/2015 1707   BASOSABS 0.0 12/30/2015 1707      . CMP Latest Ref Rng 12/23/2015 07/08/2015 05/28/2015  Glucose 70 - 99 mg/dL 121(H) 114(H) 154(H)  BUN 6 - 23 mg/dL _0 Creatinine 0.40 - 1.50 mg/dL 1.50 1.27 1.10  Sodium 135 - 145 mEq/L 143 142 141  Potassium 3.5 - 5.1 mEq/L 4.3 4.8 4.0  Chloride 96 - 112 mEq/L 107 105 106  CO2 19 - 32 mEq/L _1 Calcium 8.4 - 10.5 mg/dL 9.7 9.8 8.9  Total Protein 6.0 - 8.3 g/dL 7.3 7.3 -  Total Bilirubin 0.2 - 1.2 mg/dL 0.4 0.5 -  Alkaline Phos 39 - 117 U/L 35(L) 41 -  AST 0 - 37 U/L 15 15 -  ALT 0 - 53 U/L 15 9 -     RADIOGRAPHIC STUDIES: I have personally reviewed the radiological images as listed and agreed with the findings in the report. No results found.  ASSESSMENT & PLAN:   75 year old Caucasian male with above-mentioned medical comorbidities treated between July and September 2016 for significant gallstones, choledocholithiasis and with ascending cholangitis and a large hepatic abscess requiring catheter drainage and IV antibiotics for 4 weeks or more.  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 No anemia or thrombocytopenia.  Patient does not have a clinically palpable lymphadenopathy to suggest overt lymphoma. No overt constitutional symptoms at this time. No palpable hepatosplenomegaly. Has had nasal allergies but these are chronic and not a new factor. No recent new medications. No known rheumatologic disease. Renal function has been stable. No recent travel outside the [place] or exposure to farm animals or other pets to suggest increased risk of development infections. Has had significant antibiotic exposure last year including the presence of a PICC line that could potentially increase his risk of fungal infections.  Plan -Will repeat CBC and  evaluate the peripheral blood smear. -LDH, sedimentation rate, CRP -Peripheral blood fish panel for hypereosinophilic syndromes to check for PDGFRA with chic 2 deletion , PDGFRB and and FGFR1 to evaluate for clonal mutation suggestive of hypereosinophilic syndrome. -BCR ABL and Jak2 mutation studies to rule out other clonal myeloproliferative neoplasms that might be have related eosinophilia. -Tryptase level. -Stool ova and parasites -Patient will likely need to whole body CT chest abdomen pelvis  to rule out lymphadenopathy and evaluate his liver and spleen as well as a bone marrow biopsy. He wants to hold off on these really get initial lab results. -Further workup and treatment based on about results. -If there is any evidence of organ dysfunction would need to consider the use of steroids. Patient currently feels clinically good and has no acute concerns.  Return to clinic with Dr. Irene Limbo in 2-3 weeks with CBC, CMP to discuss the results and further action plan. Continue follow-up with primary care physician    Orders Placed This Encounter  Procedures  . Fecal, O&P    Standing Status: Future     Number of Occurrences:      Standing Expiration Date: 01/17/2017  . CBC & Diff and Retic    Standing Status: Future     Number of Occurrences:      Standing Expiration Date: 02/21/2017  . Comprehensive metabolic panel    Standing Status: Future     Number of Occurrences:      Standing Expiration Date: 01/17/2017  . Smear    Standing Status: Future     Number of Occurrences:      Standing Expiration Date: 01/17/2017  . Tryptase    Standing Status: Future     Number of Occurrences:      Standing Expiration Date: 01/17/2017  . Lactate dehydrogenase (LDH)    Standing Status: Future     Number of Occurrences:      Standing Expiration Date: 01/17/2017  . Strongyloides antibody    Standing Status: Future     Number of Occurrences:      Standing Expiration Date: 01/17/2017  . BCR-ABL    With RT-PCR  technique    Standing Status: Future     Number of Occurrences:      Standing Expiration Date: 02/21/2017  . JAK2 genotypr    Standing Status: Future     Number of Occurrences:      Standing Expiration Date: 01/17/2017  . Other/Misc lab test    Standing Status: Future     Number of Occurrences:      Standing Expiration Date: 01/17/2017    Order Specific Question:  Test name / description:    Answer:  FISH for hypereosinophic syndrome (PDGFRA, PDGFRB, FGFR1, CHIC2 deletion)  . Sedimentation rate    Standing Status: Future     Number of Occurrences:      Standing Expiration Date: 01/17/2017  . C-reactive protein    Standing Status: Future     Number of Occurrences:      Standing Expiration Date: 01/17/2017        All of the patients and his wife's questions were answered to their apparent satisfaction. The patient knows to call the clinic with any problems, questions or concerns.  I spent 60 minutes counseling the patient face to face. The total time spent in the appointment was 60 minutes and more than 50% was on counseling and direct patient cares.    Sullivan Lone MD [place] AAHIVMS [facility] Hematology/Oncology Physician [facility]  (Office):       [phone] (Work cell):  [phone] (Fax):           [phone]  01/18/2016 3:39 PM

## 2016-01-18 NOTE — Telephone Encounter (Signed)
per pof to sch pt appt-gave pt copy of avs °

## 2016-01-20 ENCOUNTER — Other Ambulatory Visit (HOSPITAL_COMMUNITY)
Admission: RE | Admit: 2016-01-20 | Discharge: 2016-01-20 | Disposition: A | Discharge status: Home / Self Care | Payer: Medicare Other | Source: Ambulatory Visit | Attending: Hematology | Admitting: Hematology

## 2016-01-20 ENCOUNTER — Other Ambulatory Visit (HOSPITAL_BASED_OUTPATIENT_CLINIC_OR_DEPARTMENT_OTHER): Payer: Medicare Other

## 2016-01-20 DIAGNOSIS — D721 Eosinophilia, unspecified: Secondary | ICD-10-CM

## 2016-01-20 LAB — COMPREHENSIVE METABOLIC PANEL
ALT: 21 U/L (ref 0–55)
AST: 22 U/L (ref 5–34)
Albumin: 3.7 g/dL (ref 3.5–5.0)
Alkaline Phosphatase: 36 U/L — ABNORMAL LOW (ref 40–150)
Anion Gap: 8 mEq/L (ref 3–11)
BUN: 16.8 mg/dL (ref 7.0–26.0)
CO2: 27 mEq/L (ref 22–29)
Calcium: 9.5 mg/dL (ref 8.4–10.4)
Chloride: 107 mEq/L (ref 98–109)
Creatinine: 1.5 mg/dL — ABNORMAL HIGH (ref 0.7–1.3)
EGFR: 45 mL/min/{1.73_m2} — ABNORMAL LOW (ref >90)
Glucose: 219 mg/dl — ABNORMAL HIGH (ref 70–140)
Potassium: 3.9 mEq/L (ref 3.5–5.1)
Sodium: 142 mEq/L (ref 136–145)
Total Bilirubin: 0.51 mg/dL (ref 0.20–1.20)
Total Protein: 7.3 g/dL (ref 6.4–8.3)

## 2016-01-20 LAB — CBC & DIFF AND RETIC
BASO%: 0.7 % (ref 0.0–2.0)
Basophils Absolute: 0 10*3/uL (ref 0.0–0.1)
EOS%: 5.8 % (ref 0.0–7.0)
Eosinophils Absolute: 0.3 10*3/uL (ref 0.0–0.5)
HCT: 43.3 % (ref 38.4–49.9)
HGB: 14.3 g/dL (ref 13.0–17.1)
Immature Retic Fract: 2.6 % — ABNORMAL LOW (ref 3.00–10.60)
LYMPH%: 23 % (ref 14.0–49.0)
MCH: 28.9 pg (ref 27.2–33.4)
MCHC: 33 g/dL (ref 32.0–36.0)
MCV: 87.5 fL (ref 79.3–98.0)
MONO#: 0.3 10*3/uL (ref 0.1–0.9)
MONO%: 5.6 % (ref 0.0–14.0)
NEUT#: 3.7 10*3/uL (ref 1.5–6.5)
NEUT%: 64.9 % (ref 39.0–75.0)
Platelets: 262 10*3/uL (ref 140–400)
RBC: 4.95 10*6/uL (ref 4.20–5.82)
RDW: 13.3 % (ref 11.0–14.6)
Retic %: 0.92 % (ref 0.80–1.80)
Retic Ct Abs: 45.54 10*3/uL (ref 34.80–93.90)
WBC: 5.7 10*3/uL (ref 4.0–10.3)
lymph#: 1.3 10*3/uL (ref 0.9–3.3)

## 2016-01-20 LAB — LACTATE DEHYDROGENASE: LDH: 163 U/L (ref 125–245)

## 2016-01-20 LAB — CHCC SMEAR

## 2016-01-20 NOTE — Addendum Note (Signed)
Addended by: Kellie Simmering A on: 01/20/2016 02:20 PM   Modules accepted: Orders

## 2016-01-21 LAB — SEDIMENTATION RATE: Sedimentation Rate-Westergren: 14 mm/hr (ref 0–30)

## 2016-01-21 LAB — C-REACTIVE PROTEIN: CRP: 2.1 mg/L (ref 0.0–4.9)

## 2016-01-23 LAB — TRYPTASE: Tryptase: 7.6 ug/L (ref 2.2–13.2)

## 2016-01-25 LAB — STRONGYLOIDES ANTIBODY: Strongyloides IgG Antibody, ELISA: NEGATIVE

## 2016-01-25 LAB — FISH, PERIPHERAL BLOOD

## 2016-01-26 LAB — TISSUE HYBRIDIZATION TO NCBH

## 2016-01-27 ENCOUNTER — Ambulatory Visit: Payer: Medicare Other

## 2016-01-27 DIAGNOSIS — D721 Eosinophilia, unspecified: Secondary | ICD-10-CM

## 2016-01-31 LAB — OVA AND PARASITE EXAMINATION

## 2016-02-08 ENCOUNTER — Telehealth: Payer: Self-pay | Admitting: Hematology

## 2016-02-08 ENCOUNTER — Ambulatory Visit (HOSPITAL_BASED_OUTPATIENT_CLINIC_OR_DEPARTMENT_OTHER): Payer: Medicare Other

## 2016-02-08 ENCOUNTER — Ambulatory Visit (HOSPITAL_BASED_OUTPATIENT_CLINIC_OR_DEPARTMENT_OTHER): Payer: Medicare Other | Admitting: Hematology

## 2016-02-08 ENCOUNTER — Encounter: Payer: Self-pay | Admitting: Hematology

## 2016-02-08 VITALS — BP 163/65 | HR 63 | Temp 97.7°F | Resp 18 | Ht 67.0 in | Wt 173.0 lb

## 2016-02-08 DIAGNOSIS — D721 Eosinophilia, unspecified: Secondary | ICD-10-CM

## 2016-02-08 LAB — CBC & DIFF AND RETIC
BASO%: 1.1 % (ref 0.0–2.0)
Basophils Absolute: 0.1 10*3/uL (ref 0.0–0.1)
EOS%: 2.4 % (ref 0.0–7.0)
Eosinophils Absolute: 0.2 10*3/uL (ref 0.0–0.5)
HCT: 43.3 % (ref 38.4–49.9)
HGB: 14.2 g/dL (ref 13.0–17.1)
Immature Retic Fract: 1.3 % — ABNORMAL LOW (ref 3.00–10.60)
LYMPH%: 27 % (ref 14.0–49.0)
MCH: 28.9 pg (ref 27.2–33.4)
MCHC: 32.8 g/dL (ref 32.0–36.0)
MCV: 88 fL (ref 79.3–98.0)
MONO#: 0.6 10*3/uL (ref 0.1–0.9)
MONO%: 8.3 % (ref 0.0–14.0)
NEUT#: 4.3 10*3/uL (ref 1.5–6.5)
NEUT%: 61.2 % (ref 39.0–75.0)
Platelets: 204 10*3/uL (ref 140–400)
RBC: 4.92 10*6/uL (ref 4.20–5.82)
RDW: 13.7 % (ref 11.0–14.6)
Retic %: 0.74 % — ABNORMAL LOW (ref 0.80–1.80)
Retic Ct Abs: 36.41 10*3/uL (ref 34.80–93.90)
WBC: 7.1 10*3/uL (ref 4.0–10.3)
lymph#: 1.9 10*3/uL (ref 0.9–3.3)
nRBC: 0 % (ref 0–0)

## 2016-02-08 NOTE — Telephone Encounter (Signed)
per po fto sch pt appt-gave pt copy of avs-sent back to lab °

## 2016-02-09 NOTE — Progress Notes (Signed)
[name]    HEMATOLOGY/ONCOLOGY CONSULTATION NOTE  Date of Service: 02/08/2016  Patient Care Team: Biagio Borg, MD as PCP - General  CHIEF COMPLAINTS/PURPOSE OF CONSULTATION:  New eosinophilia  HISTORY OF PRESENTING ILLNESS:   Neil [name] is a wonderful 75 y.o. male who has been referred to [place] by Dr [name], MD for evaluation and management of newly noted significant eosinophilia.  Patient has a history of allergic rhinitis (mild), BPH, hypertension, dyslipidemia, diabetes type 2 with notes he had significant issues with his hepatobiliary system last year.  He notes that he had large gallstones and choledocholithiasis with multiple large stones requiring an ERCP with biliary sphincterotomy basket and balloon extraction of multiple calculi and subsequent biliary stent placement for residual calculi on 03/01/2015. He subsequently had a laparoscopic cholecystectomy on 03/02/2015. He subsequently had biliary stent removal followed by balloon clearance of his CBD duct stones in May 02 2016.  On 05/22/2016 patient was admitted with shaking chills fevers and sweats and hypotension and recurrent right upper quadrant abdominal pain. 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 He had the abscesses drained by catheter placed by interventional radiology and was initially treated with IV Zosyn. Cultures grew out moderate strep viridans and lactobacillus. Blood cultures were negative. Patient had a PICC line placed and was discharged on ceftriaxone 2 g daily IV. Repeat imaging with a CT on 06/29/2015 showed interval decrease in size to 3.4 cm no new definable/drainable hepatic abscess. Drainage catheter was removed at that time. Patient was being followed by Dr .Linus Salmons from infectious disease.  Patient notes that he has been doing well since then and has had no other acute issues. No abdominal pain/fever/chills/night sweats/weight loss/loss of appetite.  No chest pain or shortness of breath. Overall has been feeling quite well.  He was seen for routine follow-up by his primary care physician Dr. [name] on 12/30/2015 and had routine labs which showed a WBC count of 12.6k with 4k that his 31% eosinophils. Previous labs on 12/23/2015 also showed 4.2k eosinophils. Prior to that his available labs from 6 months ago showed normal eosinophil counts. He is current CBC shows a normal hemoglobin of 14.4 with normal platelets of 221k. He notes no new enlarged lymph nodes. No abdominal pain.  Has had bad nasal allergies about a month ago but does not feel these are different than in previous years. No new medications since September 2016. No other acute new focal symptoms. No recent history of travel. No diarrhea. No skin rashes. No new bone pains. No pets at home. No other overt chemical exposures.  Notes that given his history of colonic polyps he is due for a repeat colonoscopy this year in May 2017. No urinary symptoms.  INTERVAL HISTORY  Neil [name] is here for his scheduled follow-up discussed the results of his eosinophilia workup. His repeat CBC in our clinic where initially seen on 01/20/2016 and then again on 02/08/2016 showed that his eosinophilia has resolved spontaneously. Fish panel as expected for eosinophilic syndromes was negative. No other acute new symptoms. Stool studies negative for implant parasites. Patient notes he feels good and is glad that his eosinophilia has resolved and that this did not show any significant bone marrow problem. No fevers/chills/night sweats/Weight loss No other new focal symptoms.  MEDICAL HISTORY:  Past Medical History  Diagnosis Date  . ALLERGIC RHINITIS 12/04/2007  . BENIGN PROSTATIC HYPERTROPHY 06/05/2007  . BUNDLE BRANCH  BLOCK, RIGHT 07/21/2009  . BURSITIS, RIGHT SHOULDER 11/06/2010  . COLONIC POLYPS, HX OF 06/05/2007    tubular adenoma also 02/19/2013  . DIVERTICULOSIS, COLON 07/21/2009  . ERECTILE  DYSFUNCTION 06/05/2007  . HYPERLIPIDEMIA 06/05/2007  . HYPERTENSION 06/05/2007  . INGUINAL HERNIA, RIGHT, SMALL 11/06/2010  . SKIN LESION 06/03/2008  . DIABETES MELLITUS, TYPE II 12/04/2007    pt states not diabetic-no meds 02-05-13 PV  . Gallstones   . Liver abscess 05/23/2015  . Hypotension   . Choledocholithiasis     SURGICAL HISTORY: Past Surgical History  Procedure Laterality Date  . Colonoscopy    . Polypectomy    . Hemorrhoid surgery    . Finger surgery Left   . Tonsillectomy    . Inguinal hernia repair Right     8'14 repair  . Ercp N/A 03/01/2015    Procedure: ENDOSCOPIC RETROGRADE CHOLANGIOPANCREATOGRAPHY (ERCP);  Surgeon: Irene Shipper, MD;  Location: [facility];  Service: Endoscopy;  Laterality: N/A;  Dr Dalbert Batman wants patient to stay overnight after ERCP  . Cholecystectomy N/A 03/02/2015    Procedure: LAPAROSCOPIC CHOLECYSTECTOMY;  Surgeon: Fanny Skates, MD;  Location: [facility];  Service: General;  Laterality: N/A;  . Ercp N/A 05/03/2015    Procedure: ENDOSCOPIC RETROGRADE CHOLANGIOPANCREATOGRAPHY (ERCP);  Surgeon: Irene Shipper, MD;  Location: [facility];  Service: Endoscopy;  Laterality: N/A;    SOCIAL HISTORY: Social History   Social History  . Marital Status: Married    Spouse Name: N/A  . Number of Children: N/A  . Years of Education: N/A   Occupational History  . retired    Social History Main Topics  . Smoking status: Former Smoker    Quit date: 10/15/2005  . Smokeless tobacco: Never Used  . Alcohol Use: No  . Drug Use: No  . Sexual Activity: Not on file   Other Topics Concern  . 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 Colon cancer Mother 83  . Brain cancer Mother 65    mets from colon cancer  . Rectal cancer Neg Hx   . Stomach cancer Neg Hx     ALLERGIES:  is allergic to ezetimibe and statins.  MEDICATIONS:  Current  Outpatient Prescriptions  Medication Sig Dispense Refill  . acetaminophen (TYLENOL) 500 MG tablet Take 1,500 mg by mouth 2 (two) times daily.     . Alpha-D-Galactosidase (BEANO PO) Take by mouth.    . benazepril (LOTENSIN) 40 MG tablet Take 1 tablet (40 mg total) by mouth daily. 90 tablet 3  . cholecalciferol (VITAMIN D) 1000 units tablet Take 1,000 Units by mouth daily.    . fenofibrate 160 MG tablet Take 1 tablet by mouth at  bedtime 90 tablet 3  . polyethylene glycol (MIRALAX / GLYCOLAX) packet Take 17 g by mouth at bedtime. 14 each 0  . sodium chloride (OCEAN) 0.65 % nasal spray Place 1 spray into the nose daily as needed for congestion.     [name] terazosin (HYTRIN) 2 MG capsule Take 1 capsule (2 mg total) by mouth once. 90 capsule 3   No current facility-administered medications for this visit.    REVIEW OF SYSTEMS:    10 Point review of Systems was done is negative except as noted above.  PHYSICAL EXAMINATION: ECOG PERFORMANCE STATUS: 1 - Symptomatic but completely ambulatory  . Filed Vitals:   02/08/16 1314  BP: 163/65  Pulse: 63  Temp: 97.7 F (36.5 C)  Resp: 18   Filed Weights   02/08/16 1314  Weight: 173 lb (78.472 kg)   .Body mass index is 27.09 kg/(m^2).  GENERAL:alert, in no acute distress and comfortable SKIN: skin color, texture, turgor are normal, no rashes or significant lesions EYES: normal, conjunctiva are pink and non-injected, sclera clear OROPHARYNX:no exudate, no erythema and lips, buccal mucosa, and tongue normal  NECK: supple, no JVD, thyroid normal size, non-tender, without nodularity LYMPH:  no palpable lymphadenopathy in the cervical, axillary or inguinal LUNGS: clear to auscultation with normal respiratory effort HEART: regular rate & rhythm,  no murmurs and bilateral trace edema ABDOMEN: abdomen soft, non-tender, normoactive bowel sounds , no hepatosplenomegaly Musculoskeletal: no cyanosis of digits and no clubbing  PSYCH: alert & oriented x 3  with fluent speech NEURO: no focal motor/sensory deficits  LABORATORY DATA:  I have reviewed the data as listed  . CBC Latest Ref Rng 02/08/2016 01/20/2016 12/30/2015  WBC 4.0 - 10.3 10e3/uL 7.1 5.7 12.6(H)  Hemoglobin 13.0 - 17.1 g/dL 14.2 14.3 14.4  Hematocrit 38.4 - 49.9 % 43.3 43.3 43.7  Platelets 140 - 400 10e3/uL 204 262 221.0   . CBC    Component Value Date/Time   WBC 7.1 02/08/2016 1419   WBC 12.6* 12/30/2015 1707   RBC 4.92 02/08/2016 1419   RBC 5.00 12/30/2015 1707   HGB 14.2 02/08/2016 1419   HGB 14.4 12/30/2015 1707   HCT 43.3 02/08/2016 1419   HCT 43.7 12/30/2015 1707   PLT 204 02/08/2016 1419   PLT 221.0 12/30/2015 1707   MCV 88.0 02/08/2016 1419   MCV 87.4 12/30/2015 1707   MCH 28.9 02/08/2016 1419   MCH 27.0 05/26/2015 0510   MCHC 32.8 02/08/2016 1419   MCHC 32.9 12/30/2015 1707   RDW 13.7 02/08/2016 1419   RDW 13.9 12/30/2015 1707   LYMPHSABS 1.9 02/08/2016 1419   LYMPHSABS 2.2 12/30/2015 1707   MONOABS 0.6 02/08/2016 1419   MONOABS 1.1* 12/30/2015 1707   EOSABS 0.2 02/08/2016 1419   EOSABS 4.0* 12/30/2015 1707   BASOSABS 0.1 02/08/2016 1419   BASOSABS 0.0 12/30/2015 1707      . CMP Latest Ref Rng 01/20/2016 12/23/2015 07/08/2015  Glucose 70 - 140 mg/dl 219(H) 121(H) 114(H)  BUN 7.0 - 26.0 mg/dL 16.'8 22 14  ' Creatinine 0.7 - 1.3 mg/dL 1.5(H) 1.50 1.27  Sodium 136 - 145 mEq/L 142 143 142  Potassium 3.5 - 5.1 mEq/L 3.9 4.3 4.8  Chloride 96 - 112 mEq/L - 107 105  CO2 22 - 29 mEq/L '27 28 30  ' Calcium 8.4 - 10.4 mg/dL 9.5 9.7 9.8  Total Protein 6.4 - 8.3 g/dL 7.3 7.3 7.3  Total Bilirubin 0.20 - 1.20 mg/dL 0.51 0.4 0.5  Alkaline Phos 40 - 150 U/L 36(L) 35(L) 41  AST 5 - 34 U/L '22 15 15  ' ALT 0 - 55 U/L '21 15 9   ' Component     Latest Ref Rng 01/20/2016  Ova + Parasite Exam        O&P result 1        Sed Rate     0 - 30 mm/hr 14  CRP     0.0 - 4.9 mg/L 2.1  Tryptase     2.2 - 13.2 ug/L 7.6  LDH     125 - 245 U/L 163  Strongyloides IgG Antibody,  ELISA      NEGATIVE  FISH  Report delivered to provider. To be scanned into EPIC   Component     Latest Ref Rng 01/27/2016  Ova + Parasite Exam      Final report  O&P result 1      Comment  Sed Rate     0 - 30 mm/hr   CRP     0.0 - 4.9 mg/L   Tryptase     2.2 - 13.2 ug/L   LDH     125 - 245 U/L   Strongyloides IgG Antibody, ELISA        FISH         RADIOGRAPHIC STUDIES: I have personally reviewed the radiological images as listed and agreed with the findings in the report. No results found.  ASSESSMENT & PLAN:   75 year old Caucasian male with above-mentioned medical comorbidities treated between July and September 2016 for significant gallstones, choledocholithiasis and with ascending cholangitis and a large hepatic abscess requiring catheter drainage and IV antibiotics for 4 weeks or more.  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 No anemia or thrombocytopenia.  Patient does not have a clinically palpable lymphadenopathy to suggest overt lymphoma. No overt constitutional symptoms at this time. No palpable hepatosplenomegaly.  FISH panel for eosinophilic disorders negative. LDH within normal limits. Strongyloides antibody negative, stool ova and parasite negative.   Patient's eosinophilia has spontaneously resolved on repeat labs. We see this checked again today and his WBC count and eosinophilia have completely resolved. This suggests that he likely had a transient factor causing reactive eosinophilia. Based on labs and resolution of eosinophilia unlikely to be a primary hypereosinophilic syndrome.  No evidence of other associated myeloproliferative neoplasm on a lymphoproliferative neoplasm or other active infection at this time.  Plan -Will hold off on additional workup . -Testing results were discussed in detail with the patient and his wife in the clinic and all of their questions were answered in great  detail . -Patient was reassured that there is no clear evidence of a primary bone marrow problem at this time or a lymphoproliferative disorder that is evident . -I would not recommend a bone marrow biopsy or a whole-body CT scan at this time. -Continue follow-up with primary care physician    return to care with Dr. Irene Limbo on an as-needed basis if new eosinophilia or other concerns arise.   Orders Placed This Encounter  Procedures  . CBC & Diff and Retic    Standing Status: Future     Number of Occurrences: 1     Standing Expiration Date: 03/14/2017    All of the patients and his wife's questions were answered to their apparent satisfaction. The patient knows to call the clinic with any problems, questions or concerns.  I spent 15 minutes counseling the patient face to face. The total time spent in the appointment was 20 minutes and more than 50% was on counseling and direct patient cares.    Sullivan Lone MD [place] AAHIVMS [facility] Hematology/Oncology Physician [facility]  (Office):       [phone] (Work cell):  [phone] (Fax):           [phone]

## 2016-02-10 ENCOUNTER — Other Ambulatory Visit: Payer: Self-pay | Admitting: *Deleted

## 2016-02-10 DIAGNOSIS — I739 Peripheral vascular disease, unspecified: Secondary | ICD-10-CM

## 2016-02-15 ENCOUNTER — Encounter: Payer: Self-pay | Admitting: Internal Medicine

## 2016-02-15 ENCOUNTER — Ambulatory Visit (AMBULATORY_SURGERY_CENTER): Payer: Self-pay | Admitting: *Deleted

## 2016-02-15 VITALS — Ht 69.0 in | Wt 173.0 lb

## 2016-02-15 DIAGNOSIS — Z86010 Personal history of colonic polyps: Secondary | ICD-10-CM

## 2016-02-15 DIAGNOSIS — Z800 Family history of malignant neoplasm of digestive organs: Secondary | ICD-10-CM

## 2016-02-15 MED ORDER — NA SULFATE-K SULFATE-MG SULF 17.5-3.13-1.6 GM/177ML PO SOLN: ORAL | Status: DC

## 2016-02-15 NOTE — Progress Notes (Signed)
Patient denies any allergies to eggs or soy. Patient denies any problems with anesthesia/sedation. Patient denies any oxygen use at home and does not take any diet/weight loss medications. Patient declined EMMI education video.   

## 2016-02-29 ENCOUNTER — Ambulatory Visit (AMBULATORY_SURGERY_CENTER): Payer: Medicare Other | Admitting: Internal Medicine

## 2016-02-29 ENCOUNTER — Encounter: Payer: Self-pay | Admitting: Internal Medicine

## 2016-02-29 VITALS — BP 126/63 | HR 55 | Temp 98.6°F | Resp 24 | Ht 69.0 in | Wt 173.0 lb

## 2016-02-29 DIAGNOSIS — D123 Benign neoplasm of transverse colon: Secondary | ICD-10-CM

## 2016-02-29 DIAGNOSIS — Z800 Family history of malignant neoplasm of digestive organs: Secondary | ICD-10-CM

## 2016-02-29 DIAGNOSIS — Z86010 Personal history of colonic polyps: Secondary | ICD-10-CM | POA: Diagnosis present

## 2016-02-29 MED ORDER — SODIUM CHLORIDE 0.9 % IV SOLN: 500.0000 mL | INTRAVENOUS | Status: DC

## 2016-02-29 NOTE — Patient Instructions (Signed)
YOU HAD AN ENDOSCOPIC PROCEDURE TODAY AT [facility]:   Refer to the procedure report that was given to you for any specific questions about what was found during the examination.  If the procedure report does not answer your questions, please call your gastroenterologist to clarify.  If you requested that your care partner not be given the details of your procedure findings, then the procedure report has been included in a sealed envelope for you to review at your convenience later.  YOU SHOULD EXPECT: Some feelings of bloating in the abdomen. Passage of more gas than usual.  Walking can help get rid of the air that was put into your GI tract during the procedure and reduce the bloating. If you had a lower endoscopy (such as a colonoscopy or flexible sigmoidoscopy) you may notice spotting of blood in your stool or on the toilet paper. If you underwent a bowel prep for your procedure, you may not have a normal bowel movement for a few days.  Please Note:  You might notice some irritation and congestion in your nose or some drainage.  This is from the oxygen used during your procedure.  There is no need for concern and it should clear up in a day or so.  SYMPTOMS TO REPORT IMMEDIATELY:   Following lower endoscopy (colonoscopy or flexible sigmoidoscopy):  Excessive amounts of blood in the stool  Significant tenderness or worsening of abdominal pains  Swelling of the abdomen that is new, acute  Fever of 100F or higher   For urgent or emergent issues, a gastroenterologist can be reached at any hour by calling [phone].   DIET: Your first meal following the procedure should be a small meal and then it is ok to progress to your normal diet. Heavy or fried foods are harder to digest and may make you feel nauseous or bloated.  Likewise, meals heavy in dairy and vegetables can increase bloating.  Drink plenty of fluids but you should avoid alcoholic beverages for 24  hours.  ACTIVITY:  You should plan to take it easy for the rest of today and you should NOT DRIVE or use heavy machinery until tomorrow (because of the sedation medicines used during the test).    FOLLOW UP: Our staff will call the number listed on your records the next business day following your procedure to check on you and address any questions or concerns that you may have regarding the information given to you following your procedure. If we do not reach you, we will leave a message.  However, if you are feeling well and you are not experiencing any problems, there is no need to return our call.  We will assume that you have returned to your regular daily activities without incident.  If any biopsies were taken you will be contacted by phone or by letter within the next 1-3 weeks.  Please call us at [phone] if you have not heard about the biopsies in 3 weeks.    SIGNATURES/CONFIDENTIALITY: You and/or your care partner have signed paperwork which will be entered into your electronic medical record.  These signatures attest to the fact that that the information above on your After Visit Summary has been reviewed and is understood.  Full responsibility of the confidentiality of this discharge information lies with you and/or your care-partner.  Hemorrhoid information given.  Polyp, diverticulosis, and high fiber diet information given.

## 2016-02-29 NOTE — Progress Notes (Signed)
A and Ox 3 Report to RN 

## 2016-02-29 NOTE — Progress Notes (Signed)
Called to room to assist during endoscopic procedure.  Patient ID and intended procedure confirmed with present staff. Received instructions for my participation in the procedure from the performing physician.  

## 2016-02-29 NOTE — Op Note (Signed)
[facility] Patient Name: Neil [name] Procedure Date: 02/29/2016 9:33 AM MRN: [id number] Endoscopist: Docia Chuck. Henrene Pastor , MD Age: 75 Referring MD:  Date of Birth: 08/31/1941 Gender: Male Procedure:                Colonoscopy, with cold snare polypectomy -2 Indications:              Surveillance: Personal history of adenomatous                            polyps on last colonoscopy 3 years ago. Index                            examination 2010 (Dr. Collene Mares) with multiple advanced                            adenomatous. Last examination 2014 with 3 small                            tubular adenomas. A parent with colon cancer about                            age 61 Medicines:                Monitored Anesthesia Care Procedure:                Pre-Anesthesia Assessment:                           - Prior to the procedure, a History and Physical                            was performed, and patient medications and                            allergies were reviewed. The patient's tolerance of                            previous anesthesia was also reviewed. The risks                            and benefits of the procedure and the sedation                            options and risks were discussed with the patient.                            All questions were answered, and informed consent                            was obtained. Prior Anticoagulants: The patient has                            taken no previous anticoagulant or antiplatelet  agents. ASA Grade Assessment: II - A patient with                            mild systemic disease. After reviewing the risks                            and benefits, the patient was deemed in                            satisfactory condition to undergo the procedure.                           After obtaining informed consent, the colonoscope                            was passed under direct vision. Throughout the                      procedure, the patient's blood pressure, pulse, and                            oxygen saturations were monitored continuously. The                            Model CF-HQ190L [id number]) scope was introduced                            through the anus and advanced to the the cecum,                            identified by appendiceal orifice and ileocecal                            valve. The ileocecal valve, appendiceal orifice,                            and rectum were photographed. The quality of the                            bowel preparation was good. The colonoscopy was                            performed without difficulty. The patient tolerated                            the procedure well. The bowel preparation used was                            SUPREP. Scope In: 9:54:15 AM Scope Out: 10:05:54 AM Scope Withdrawal Time: 0 hours 10 minutes 6 seconds  Total Procedure Duration: 0 hours 11 minutes 39 seconds  Findings:                 The perianal exam findings include non-thrombosed  external hemorrhoids.                           Two polyps were found in the colon. The polyps were                            2 to 4 mm in size. These polyps were removed with a                            cold snare. Resection and retrieval were complete.                           Multiple small and large-mouthed diverticula were                            found in the left colon.                           The exam was otherwise without abnormality on                            direct and retroflexion views. Complications:            No immediate complications. Estimated blood loss:                            None. Estimated Blood Loss:     Estimated blood loss: none. Impression:               - Non-thrombosed external hemorrhoids found on                            perianal exam.                           - Two 2 to 4 mm polyps, removed with a cold  snare.                            Resected and retrieved.                           - Diverticulosis in the left colon.                           - The examination was otherwise normal on direct                            and retroflexion views. Recommendation:           - Repeat colonoscopy in 5 years for surveillance.                           - Await pathology results. Docia Chuck. Henrene Pastor, MD 02/29/2016 10:13:34 AM This report has been signed electronically.

## 2016-03-01 ENCOUNTER — Telehealth: Payer: Self-pay

## 2016-03-01 NOTE — Telephone Encounter (Signed)
  Follow up Call-  Call back number 02/29/2016  Post procedure Call Back phone  # [phone]  Permission to leave phone message Yes     Patient questions:  Do you have a fever, pain , or abdominal swelling? No. Pain Score  0 *  Have you tolerated food without any problems? Yes.    Have you been able to return to your normal activities? Yes.    Do you have any questions about your discharge instructions: Diet   No. Medications  No. Follow up visit  No.  Do you have questions or concerns about your Care? No.  Actions: * If pain score is 4 or above: No action needed, pain <4.

## 2016-03-02 ENCOUNTER — Ambulatory Visit (INDEPENDENT_AMBULATORY_CARE_PROVIDER_SITE_OTHER)
Admission: RE | Admit: 2016-03-02 | Discharge: 2016-03-02 | Disposition: A | Discharge status: Home / Self Care | Payer: Medicare Other | Source: Ambulatory Visit | Attending: Acute Care | Admitting: Acute Care

## 2016-03-02 DIAGNOSIS — Z87891 Personal history of nicotine dependence: Secondary | ICD-10-CM | POA: Diagnosis not present

## 2016-03-06 ENCOUNTER — Encounter: Payer: Self-pay | Admitting: Internal Medicine

## 2016-03-07 ENCOUNTER — Encounter: Payer: Self-pay | Admitting: Internal Medicine

## 2016-03-07 ENCOUNTER — Telehealth: Payer: Self-pay | Admitting: Acute Care

## 2016-03-07 DIAGNOSIS — Z87891 Personal history of nicotine dependence: Secondary | ICD-10-CM

## 2016-03-07 DIAGNOSIS — I2510 Atherosclerotic heart disease of native coronary artery without angina pectoris: Secondary | ICD-10-CM

## 2016-03-07 HISTORY — DX: Atherosclerotic heart disease of native coronary artery without angina pectoris: I25.10

## 2016-03-07 NOTE — Telephone Encounter (Signed)
The results of the low-dose CT scan have been called to Mr. Neil [name]. I explained to him that his scan was read as a lung RADS 2, nodules that are benign in appearance and behavior. Recommendation per radiology is continued annual screening with low-dose CT in 12 months, which we will call and schedule. Neil [name] verbalized understanding and had no further questions.

## 2016-04-24 ENCOUNTER — Encounter: Payer: Self-pay | Admitting: Vascular Surgery

## 2016-04-27 ENCOUNTER — Ambulatory Visit (INDEPENDENT_AMBULATORY_CARE_PROVIDER_SITE_OTHER): Payer: Medicare Other | Admitting: Vascular Surgery

## 2016-04-27 ENCOUNTER — Ambulatory Visit (HOSPITAL_COMMUNITY): Payer: Medicare Other

## 2016-04-27 ENCOUNTER — Ambulatory Visit (HOSPITAL_COMMUNITY)
Admission: RE | Admit: 2016-04-27 | Discharge: 2016-04-27 | Disposition: A | Discharge status: Home / Self Care | Payer: Medicare Other | Source: Ambulatory Visit | Attending: Vascular Surgery | Admitting: Vascular Surgery

## 2016-04-27 ENCOUNTER — Encounter: Payer: Medicare Other | Admitting: Vascular Surgery

## 2016-04-27 ENCOUNTER — Encounter: Payer: Self-pay | Admitting: Vascular Surgery

## 2016-04-27 ENCOUNTER — Ambulatory Visit (INDEPENDENT_AMBULATORY_CARE_PROVIDER_SITE_OTHER)
Admission: RE | Admit: 2016-04-27 | Discharge: 2016-04-27 | Disposition: A | Discharge status: Home / Self Care | Payer: Medicare Other | Source: Ambulatory Visit | Attending: Vascular Surgery | Admitting: Vascular Surgery

## 2016-04-27 VITALS — BP 141/69 | HR 78 | Resp 18 | Ht 69.0 in | Wt 174.4 lb

## 2016-04-27 DIAGNOSIS — I779 Disorder of arteries and arterioles, unspecified: Secondary | ICD-10-CM | POA: Diagnosis not present

## 2016-04-27 DIAGNOSIS — I10 Essential (primary) hypertension: Secondary | ICD-10-CM | POA: Insufficient documentation

## 2016-04-27 DIAGNOSIS — R938 Abnormal findings on diagnostic imaging of other specified body structures: Secondary | ICD-10-CM | POA: Diagnosis not present

## 2016-04-27 DIAGNOSIS — I70202 Unspecified atherosclerosis of native arteries of extremities, left leg: Secondary | ICD-10-CM | POA: Diagnosis not present

## 2016-04-27 DIAGNOSIS — E785 Hyperlipidemia, unspecified: Secondary | ICD-10-CM | POA: Diagnosis not present

## 2016-04-27 DIAGNOSIS — I739 Peripheral vascular disease, unspecified: Secondary | ICD-10-CM

## 2016-04-27 DIAGNOSIS — E119 Type 2 diabetes mellitus without complications: Secondary | ICD-10-CM | POA: Diagnosis not present

## 2016-04-27 LAB — VAS US LOWER EXTREMITY ARTERIAL DUPLEX
LEFT PERO DIST SYS: -26 cm/s
Left ant tibial distal sys: 112 cm/s
Left popliteal dist sys PSV: -76 cm/s
Left popliteal prox sys PSV: 74 cm/s
Left super femoral dist sys PSV: -139 cm/s
Left super femoral mid sys PSV: -145 cm/s
Left super femoral prox sys PSV: -123 cm/s
RIGHT ANT DIST TIBAL SYS PSV: 99 cm/s
RIGHT POST TIB DIST SYS: 60 cm/s
Right peroneal sys PSV: 31 cm/s
Right popliteal dist sys PSV: -94 cm/s
Right popliteal prox sys PSV: 79 cm/s
Right super femoral prox sys PSV: -165 cm/s
left post tibial dist sys: 59 cm/s

## 2016-04-27 NOTE — Progress Notes (Signed)
Referred by:  Biagio Borg, MD [place], [place] [id number]   Reason for referral: bilateral calf cramping   History of Present Illness  Neil [name] is a 75 y.o. (01/23/1941) male who presents with chief complaint: right lower back pain and cramping in calves.  The patient does not have reproducible calf cramping with walking but he has intermittent cramping in calves with walking and unpredictable even at rest.  He has no rest pain sx.  He denies any wounds or gangrene.  This patient's atherosclerotic risks include: HLD, HTN, DM and prior smoking.  Past Medical History  Diagnosis Date  . ALLERGIC RHINITIS 12/04/2007  . BENIGN PROSTATIC HYPERTROPHY 06/05/2007  . BUNDLE BRANCH BLOCK, RIGHT 07/21/2009  . BURSITIS, RIGHT SHOULDER 11/06/2010  . COLONIC POLYPS, HX OF 06/05/2007    tubular adenoma also 02/19/2013  . DIVERTICULOSIS, COLON 07/21/2009  . ERECTILE DYSFUNCTION 06/05/2007  . HYPERLIPIDEMIA 06/05/2007  . HYPERTENSION 06/05/2007  . INGUINAL HERNIA, RIGHT, SMALL 11/06/2010  . SKIN LESION 06/03/2008  . DIABETES MELLITUS, TYPE II 12/04/2007    pt states not diabetic-no meds 02-05-13 PV  . Gallstones   . Liver abscess 05/23/2015  . Hypotension   . Choledocholithiasis   . Coronary artery calcification seen on CT scan 03/07/2016    Past Surgical History  Procedure Laterality Date  . Colonoscopy    . Polypectomy    . Hemorrhoid surgery    . Finger surgery Left   . Tonsillectomy    . Inguinal hernia repair Right     8'14 repair  . Ercp N/A 03/01/2015    Procedure: ENDOSCOPIC RETROGRADE CHOLANGIOPANCREATOGRAPHY (ERCP);  Surgeon: Irene Shipper, MD;  Location: [facility];  Service: Endoscopy;  Laterality: N/A;  Dr Dalbert Batman wants patient to stay overnight after ERCP  . Cholecystectomy N/A 03/02/2015    Procedure: LAPAROSCOPIC CHOLECYSTECTOMY;  Surgeon: Fanny Skates, MD;  Location: [facility];  Service: General;  Laterality: N/A;  . Ercp N/A 05/03/2015    Procedure:  ENDOSCOPIC RETROGRADE CHOLANGIOPANCREATOGRAPHY (ERCP);  Surgeon: Irene Shipper, MD;  Location: [facility];  Service: Endoscopy;  Laterality: N/A;    Social History   Social History  . Marital Status: Married    Spouse Name: N/A  . Number of Children: N/A  . Years of Education: N/A   Occupational History  . retired    Social History Main Topics  . Smoking status: Former Smoker    Quit date: 10/15/2005  . Smokeless tobacco: Never Used  . Alcohol Use: No  . Drug Use: No  . Sexual Activity: Not on file   Other Topics Concern  . Not on file   Social History Narrative    Family History  Problem Relation Age of Onset  . Colon cancer Mother 103  . Brain cancer Mother 33    mets from colon cancer  . Rectal cancer Neg Hx   . Stomach cancer Neg Hx     Current Outpatient Prescriptions  Medication Sig Dispense Refill  . acetaminophen (TYLENOL) 500 MG tablet Take 1,500 mg by mouth 2 (two) times daily.     . Alpha-D-Galactosidase (BEANO PO) Take by mouth.    . benazepril (LOTENSIN) 40 MG tablet Take 1 tablet (40 mg total) by mouth daily. 90 tablet 3  . cholecalciferol (VITAMIN D) 1000 units tablet Take 1,000 Units by mouth daily.    . fenofibrate 160 MG tablet Take 1 tablet by mouth at  bedtime 90 tablet  3  . sodium chloride (OCEAN) 0.65 % nasal spray Place 1 spray into the nose daily as needed for congestion.     [name] terazosin (HYTRIN) 2 MG capsule Take 1 capsule (2 mg total) by mouth once. 90 capsule 3   No current facility-administered medications for this visit.     Allergies  Allergen Reactions  . Statins     REACTION: muscle aches  . Zetia [Ezetimibe] Other (See Comments)    Statins= Muscle and bone pain     REVIEW OF SYSTEMS:  (Positives checked otherwise negative)  CARDIOVASCULAR:   [ ]  chest pain,  [ ]  chest pressure,  [ ]  palpitations,  [ ]  shortness of breath when laying flat,  [ ]  shortness of breath with exertion,   [x]  pain in feet when walking,  [ ]   pain in feet when laying flat, [ ]  history of blood clot in veins (DVT),  [ ]  history of phlebitis,  [ ]  swelling in legs,  [ ]  varicose veins  PULMONARY:   [ ]  productive cough,  [ ]  asthma,  [ ]  wheezing  NEUROLOGIC:   [x]  weakness in arms or legs,  [ ]  numbness in arms or legs,  [ ]  difficulty speaking or slurred speech,  [ ]  temporary loss of vision in one eye,  [ ]  dizziness  HEMATOLOGIC:   [ ]  bleeding problems,  [ ]  problems with blood clotting too easily  MUSCULOSKEL:   [ ]  joint pain, [ ]  joint swelling  GASTROINTEST:   [ ]  vomiting blood,  [ ]  blood in stool     GENITOURINARY:   [ ]  burning with urination,  [ ]  blood in urine  PSYCHIATRIC:   [ ]  history of major depression  INTEGUMENTARY:   [ ]  rashes,  [ ]  ulcers  CONSTITUTIONAL:   [ ]  fever,  [ ]  chills   For VQI Use Only  PRE-ADM LIVING: Home  AMB STATUS: Ambulatory  CAD Sx: None  PRIOR CHF: None  STRESS TEST: [x]  No, [ ]  Normal, [ ]  + ischemia, [ ]  + MI, [ ]  Both   Physical Examination  Filed Vitals:   04/27/16 1117 04/27/16 1118  BP: 147/68 141/69  Pulse: 78   Resp: 18   Height: 5\' 9"  (1.753 m)   Weight: 174 lb 6.4 oz (79.107 kg)   SpO2: 98%    Body mass index is 25.74 kg/(m^2).  General: A&O x 3, WDWN  Head: [place]/AT  Ear/Nose/Throat: Hearing grossly intact, nares w/o erythema or drainage, oropharynx w/o Erythema/Exudate, Mallampati score: 3  Eyes: PERRLA, EOMI  Neck: Supple, no nuchal rigidity, no palpable LAD  Pulmonary: Sym exp, good air movt, CTAB, no rales, rhonchi, & wheezing  Cardiac: RRR, Nl S1, S2, no Murmurs, rubs or gallops  Vascular: Vessel Right Left  Radial Palpable Palpable  Brachial Palpable Palpable  Carotid Palpable, without bruit Palpable, without bruit  Aorta Not palpable N/A  Femoral Palpable Palpable  Popliteal Not palpable Not palpable  PT Palpable Palpable  DP Palpable Palpable   Gastrointestinal: soft, NTND, no G/R, no HSM, no  masses, no CVAT B  Musculoskeletal: M/S 5/5 throughout , Extremities without ischemic changes   Neurologic: CN 2-12 intact , Pain and light touch intact in extremities , Motor exam as listed above  Psychiatric: Judgment intact, Mood & affect appropriate for pt's clinical situation  Dermatologic: See M/S exam for extremity exam, no rashes otherwise noted  Lymph : No Cervical, Axillary, or  Inguinal lymphadenopathy   Non-Invasive Vascular Imaging  ABI (Date: 04/27/2016)  RLE: 0.98, PT: tri, DP: tri, TBI: 0.65  LLE: 1.0, PT: tri, DP: tri, TBI: 0.78  BLE arterial duplex (Date: 04/27/2016)  R: bi/triphasic throughout, some evidence of <50% in R SFA  B: biphasic throughout  Diffuse non-hemodynamically significant plaque throughout BLE   Outside Studies/Documentation 3 pages of outside documents were reviewed including: outside PCP chart.   Medical Decision Making  Neil [name] is a 75 y.o. male who presents with: mild BLE PAD.   Pt's sx are atypical for vasculogenic claudication.  His ABI are consistent with some mild PAD in both legs.  I don't have a high enough suspicion for iliac disease to consider exercise ABI.  At this point, I would focus on maximal medical mgmt.  I discussed in depth with the patient the nature of atherosclerosis, and emphasized the importance of maximal medical management including strict control of blood pressure, blood glucose, and lipid levels, antiplatelet agents, obtaining regular exercise, and cessation of smoking.    The patient is aware that without maximal medical management the underlying atherosclerotic disease process will progress, limiting the benefit of any interventions. The patient is currently not on a statin: due reported allergies. The patient is currently on an anti-platelet.  The patient will be started on ASA 81 mg PO daily.  Thank you for allowing [place] to participate in this patient's care.   Adele Barthel, MD [facility] Office: [phone] Pager: [phone]  04/27/2016, 2:09 PM

## 2016-06-28 ENCOUNTER — Other Ambulatory Visit (INDEPENDENT_AMBULATORY_CARE_PROVIDER_SITE_OTHER): Payer: Medicare Other

## 2016-06-28 DIAGNOSIS — R7989 Other specified abnormal findings of blood chemistry: Secondary | ICD-10-CM

## 2016-06-28 DIAGNOSIS — D721 Eosinophilia, unspecified: Secondary | ICD-10-CM

## 2016-06-28 DIAGNOSIS — E785 Hyperlipidemia, unspecified: Secondary | ICD-10-CM | POA: Diagnosis not present

## 2016-06-28 LAB — CBC WITH DIFFERENTIAL/PLATELET
Basophils Absolute: 0 10*3/uL (ref 0.0–0.1)
Basophils Relative: 0.6 % (ref 0.0–3.0)
Eosinophils Absolute: 0.3 10*3/uL (ref 0.0–0.7)
Eosinophils Relative: 3.8 % (ref 0.0–5.0)
HCT: 43.9 % (ref 39.0–52.0)
Hemoglobin: 14.7 g/dL (ref 13.0–17.0)
Lymphocytes Relative: 22.6 % (ref 12.0–46.0)
Lymphs Abs: 1.8 10*3/uL (ref 0.7–4.0)
MCHC: 33.4 g/dL (ref 30.0–36.0)
MCV: 89.3 fl (ref 78.0–100.0)
Monocytes Absolute: 0.9 10*3/uL (ref 0.1–1.0)
Monocytes Relative: 11 % (ref 3.0–12.0)
Neutro Abs: 5 10*3/uL (ref 1.4–7.7)
Neutrophils Relative %: 62 % (ref 43.0–77.0)
Platelets: 245 10*3/uL (ref 150.0–400.0)
RBC: 4.92 Mil/uL (ref 4.22–5.81)
RDW: 14.5 % (ref 11.5–15.5)
WBC: 8.1 10*3/uL (ref 4.0–10.5)

## 2016-06-28 LAB — BASIC METABOLIC PANEL
BUN: 17 mg/dL (ref 6–23)
CO2: 27 mEq/L (ref 19–32)
Calcium: 10 mg/dL (ref 8.4–10.5)
Chloride: 103 mEq/L (ref 96–112)
Creatinine, Ser: 1.5 mg/dL (ref 0.40–1.50)
GFR: 48.6 mL/min — ABNORMAL LOW (ref >60.00)
Glucose, Bld: 132 mg/dL — ABNORMAL HIGH (ref 70–99)
Potassium: 4.1 mEq/L (ref 3.5–5.1)
Sodium: 141 mEq/L (ref 135–145)

## 2016-06-28 LAB — HEPATIC FUNCTION PANEL
ALT: 14 U/L (ref 0–53)
AST: 16 U/L (ref 0–37)
Albumin: 4.6 g/dL (ref 3.5–5.2)
Alkaline Phosphatase: 29 U/L — ABNORMAL LOW (ref 39–117)
Bilirubin, Direct: 0.1 mg/dL (ref 0.0–0.3)
Total Bilirubin: 0.5 mg/dL (ref 0.2–1.2)
Total Protein: 7.7 g/dL (ref 6.0–8.3)

## 2016-06-28 LAB — LIPID PANEL
Cholesterol: 198 mg/dL (ref 0–200)
HDL: 37.3 mg/dL — ABNORMAL LOW (ref >39.00)
NonHDL: 160.94
Total CHOL/HDL Ratio: 5
Triglycerides: 290 mg/dL — ABNORMAL HIGH (ref 0.0–149.0)
VLDL: 58 mg/dL — ABNORMAL HIGH (ref 0.0–40.0)

## 2016-06-28 LAB — HEMOGLOBIN A1C: Hgb A1c MFr Bld: 6 % (ref 4.6–6.5)

## 2016-06-28 LAB — LDL CHOLESTEROL, DIRECT: Direct LDL: 107 mg/dL

## 2016-07-03 ENCOUNTER — Encounter: Payer: Self-pay | Admitting: Internal Medicine

## 2016-07-03 ENCOUNTER — Ambulatory Visit (INDEPENDENT_AMBULATORY_CARE_PROVIDER_SITE_OTHER): Payer: Medicare Other | Admitting: Internal Medicine

## 2016-07-03 VITALS — BP 142/78 | HR 95 | Temp 98.7°F | Resp 20 | Wt 173.0 lb

## 2016-07-03 DIAGNOSIS — Z418 Encounter for other procedures for purposes other than remedying health state: Secondary | ICD-10-CM | POA: Diagnosis not present

## 2016-07-03 DIAGNOSIS — Z299 Encounter for prophylactic measures, unspecified: Secondary | ICD-10-CM

## 2016-07-03 DIAGNOSIS — Z0000 Encounter for general adult medical examination without abnormal findings: Secondary | ICD-10-CM

## 2016-07-03 NOTE — Progress Notes (Signed)
Subjective:    Patient ID: Neil [name], male    DOB: 1941-09-11, 75 y.o.   MRN: [id number]  HPI  Here for wellness and f/u;  Overall doing ok;  Pt denies Chest pain, worsening SOB, DOE, wheezing, orthopnea, PND, worsening LE edema, palpitations, dizziness or syncope.  Pt denies neurological change such as new headache, facial or extremity weakness.  Pt denies polydipsia, polyuria, or low sugar symptoms. Pt states overall good compliance with treatment and medications, good tolerability, and has been trying to follow appropriate diet.  Pt denies worsening depressive symptoms, suicidal ideation or panic. No fever, night sweats, wt loss, loss of appetite, or other constitutional symptoms.  Pt states good ability with ADL's, has low fall risk, home safety reviewed and adequate, no other significant changes in hearing or vision, and only occasionally active with exercise. No new complaints Past Medical History:  Diagnosis Date  . ALLERGIC RHINITIS 12/04/2007  . BENIGN PROSTATIC HYPERTROPHY 06/05/2007  . BUNDLE BRANCH BLOCK, RIGHT 07/21/2009  . BURSITIS, RIGHT SHOULDER 11/06/2010  . Choledocholithiasis   . COLONIC POLYPS, HX OF 06/05/2007   tubular adenoma also 02/19/2013  . Coronary artery calcification seen on CT scan 03/07/2016  . DIABETES MELLITUS, TYPE II 12/04/2007   pt states not diabetic-no meds 02-05-13 PV  . DIVERTICULOSIS, COLON 07/21/2009  . ERECTILE DYSFUNCTION 06/05/2007  . Gallstones   . HYPERLIPIDEMIA 06/05/2007  . HYPERTENSION 06/05/2007  . Hypotension   . INGUINAL HERNIA, RIGHT, SMALL 11/06/2010  . Liver abscess 05/23/2015  . SKIN LESION 06/03/2008   Past Surgical History:  Procedure Laterality Date  . CHOLECYSTECTOMY N/A 03/02/2015   Procedure: LAPAROSCOPIC CHOLECYSTECTOMY;  Surgeon: Fanny Skates, MD;  Location: [facility];  Service: General;  Laterality: N/A;  . COLONOSCOPY    . ERCP N/A 03/01/2015   Procedure: ENDOSCOPIC RETROGRADE CHOLANGIOPANCREATOGRAPHY (ERCP);  Surgeon: Irene Shipper, MD;  Location: [facility];  Service: Endoscopy;  Laterality: N/A;  Dr Dalbert Batman wants patient to stay overnight after ERCP  . ERCP N/A 05/03/2015   Procedure: ENDOSCOPIC RETROGRADE CHOLANGIOPANCREATOGRAPHY (ERCP);  Surgeon: Irene Shipper, MD;  Location: [facility];  Service: Endoscopy;  Laterality: N/A;  . FINGER SURGERY Left   . HEMORRHOID SURGERY    . INGUINAL HERNIA REPAIR Right    8'14 repair  . POLYPECTOMY    . TONSILLECTOMY      reports that he quit smoking about 10 years ago. He has never used smokeless tobacco. He reports that he does not drink alcohol or use drugs. family history includes Brain cancer (age of onset: 50) in his mother; Colon cancer (age of onset: 94) in his mother. Allergies  Allergen Reactions  . Statins     REACTION: muscle aches  . Zetia [Ezetimibe] Other (See Comments)    Statins= Muscle and bone pain   Current Outpatient Prescriptions on File Prior to Visit  Medication Sig Dispense Refill  . acetaminophen (TYLENOL) 500 MG tablet Take 1,500 mg by mouth 2 (two) times daily.     . Alpha-D-Galactosidase (BEANO PO) Take by mouth.    . benazepril (LOTENSIN) 40 MG tablet Take 1 tablet (40 mg total) by mouth daily. 90 tablet 3  . cholecalciferol (VITAMIN D) 1000 units tablet Take 1,000 Units by mouth daily.    . fenofibrate 160 MG tablet Take 1 tablet by mouth at  bedtime 90 tablet 3  . sodium chloride (OCEAN) 0.65 % nasal spray Place 1 spray into the nose daily as needed for congestion.     [name]  terazosin (HYTRIN) 2 MG capsule Take 1 capsule (2 mg total) by mouth once. 90 capsule 3   No current facility-administered medications on file prior to visit.    Review of Systems Constitutional: Negative for increased diaphoresis, or other activity, appetite or siginficant weight change other than noted HENT: Negative for worsening hearing loss, ear pain, facial swelling, mouth sores and neck stiffness.   Eyes: Negative for other worsening pain, redness or visual  disturbance.  Respiratory: Negative for choking or stridor Cardiovascular: Negative for other chest pain and palpitations.  Gastrointestinal: Negative for worsening diarrhea, blood in stool, or abdominal distention Genitourinary: Negative for hematuria, flank pain or change in urine volume.  Musculoskeletal: Negative for myalgias or other joint complaints.  Skin: Negative for other color change and wound or drainage.  Neurological: Negative for syncope and numbness. other than noted Hematological: Negative for adenopathy. or other swelling Psychiatric/Behavioral: Negative for hallucinations, SI, self-injury, decreased concentration or other worsening agitation.      Objective:   Physical Exam BP (!) 142/78   Pulse 95   Temp 98.7 F (37.1 C) (Oral)   Resp 20   Wt 173 lb (78.5 kg)   SpO2 97%   BMI 25.55 kg/m  VS noted,  Constitutional: Pt is oriented to person, place, and time. Appears well-developed and well-nourished, in no significant distress Head: Normocephalic and atraumatic  Eyes: Conjunctivae and EOM are normal. Pupils are equal, round, and reactive to light Right Ear: External ear normal.  Left Ear: External ear normal Nose: Nose normal.  Mouth/Throat: Oropharynx is clear and moist  Neck: Normal range of motion. Neck supple. No JVD present. No tracheal deviation present or significant neck LA or mass Cardiovascular: Normal rate, regular rhythm, normal heart sounds and intact distal pulses.   Pulmonary/Chest: Effort normal and breath sounds without rales or wheezing  Abdominal: Soft. Bowel sounds are normal. NT. No HSM  Musculoskeletal: Normal range of motion. Exhibits no edema Lymphadenopathy: Has no cervical adenopathy.  Neurological: Pt is alert and oriented to person, place, and time. Pt has normal reflexes. No cranial nerve deficit. Motor grossly intact Skin: Skin is warm and dry. No rash noted or new ulcers Psychiatric:  Has normal mood and affect. Behavior is  normal. Lab Results  Component Value Date   WBC 8.1 06/28/2016   HGB 14.7 06/28/2016   HCT 43.9 06/28/2016   PLT 245.0 06/28/2016   GLUCOSE 132 (H) 06/28/2016   CHOL 198 06/28/2016   TRIG 290.0 (H) 06/28/2016   HDL 37.30 (L) 06/28/2016   LDLDIRECT 107.0 06/28/2016   LDLCALC 93 07/08/2015   ALT 14 06/28/2016   AST 16 06/28/2016   NA 141 06/28/2016   K 4.1 06/28/2016   CL 103 06/28/2016   CREATININE 1.50 06/28/2016   BUN 17 06/28/2016   CO2 27 06/28/2016   TSH 0.55 12/23/2015   PSA 2.46 12/23/2015   INR 1.25 05/23/2015   HGBA1C 6.0 06/28/2016   MICROALBUR 5.6 (H) 07/08/2015        Assessment & Plan:

## 2016-07-03 NOTE — Patient Instructions (Signed)
You had the flu shot today  Please continue all other medications as before, and refills have been done if requested.  Please have the pharmacy call with any other refills you may need.  Please continue your efforts at being more active, low cholesterol diet, and weight control.  You are otherwise up to date with prevention measures today.  Please keep your appointments with your specialists as you may have planned  Please return in 6 months, or sooner if needed 

## 2016-07-03 NOTE — Progress Notes (Signed)
Pre visit review using our clinic review tool, if applicable. No additional management support is needed unless otherwise documented below in the visit note. 

## 2016-07-03 NOTE — Assessment & Plan Note (Signed)

## 2016-07-31 ENCOUNTER — Telehealth: Payer: Self-pay | Admitting: Emergency Medicine

## 2016-07-31 ENCOUNTER — Telehealth: Payer: Self-pay | Admitting: Internal Medicine

## 2016-07-31 NOTE — Telephone Encounter (Signed)
error 

## 2016-07-31 NOTE — Telephone Encounter (Signed)
Patient Name: Neil [name] DOB: 05/13/1941 Initial Comment Caller states has questions on a medication he's on. Nurse Assessment Nurse: Ronnald Ramp, RN, [place] Date/Time (Eastern Time): 07/31/2016 11:36:03 AM Confirm and document reason for call. If symptomatic, describe symptoms. You must click the next button to save text entered. ---Caller states last year he had complications with gallstones, surgery, and abscess. He takes 3 maintenance medications, one being Fenofibrate. He read over the medication information that came with his prescription. He states he had many of the symptoms associated with side effects of the medication. He is not currently having any symptoms. He is concerned that he is still on this medication when he had serious symptoms over 1 year ago (eventhough those were attributed at the time to gallstones). Has the patient traveled out of the country within the last 30 days? ---Not Applicable Does the patient have any new or worsening symptoms? ---No Please document clinical information provided and list any resource used. ---Tried to encourage caller that it did not appear to be complications from the [place] that caused his symptoms last year since the symptom resolved after treating the gallstones and the resulting liver abscess. Caller states he is no confident that he should be on this medication and dose not understand why he is still on the medication after all the problems he had. He was not able to be reassured and therefore, I told him I would enter a note in the chart for his MD and ask that the doctor contact him. Guidelines Guideline Title Affirmed Question Affirmed Notes Final Disposition User Clinical Call Ronnald Ramp, RN, [employer]

## 2016-08-03 ENCOUNTER — Telehealth: Payer: Self-pay | Admitting: Internal Medicine

## 2016-08-03 NOTE — Telephone Encounter (Signed)
Please clarify the side effects he believes may be having  OK to hold on taking for 2 wks to see if symptoms resolve, then consider re-starting at 2 wks if symptoms not better (since most likely not related)

## 2016-08-03 NOTE — Telephone Encounter (Signed)
Patient called in on 10/17 and spoke with the nurse line.  He had concerns about taking the fenofibrate. In regard to the side effects.  Please follow up with patient in regard.

## 2016-08-03 NOTE — Telephone Encounter (Signed)
This would not be true, as fenofibrate does not infections or any of his other live issue as he had last year.  OK to continue fenofibrate.  Please do not go by the lawyer warnings on the medication side effect list as this is only to keep the company from being sued, and often is not true.

## 2016-08-03 NOTE — Telephone Encounter (Signed)
He believes this medication could be related to the liver issues he had last year.

## 2016-08-03 NOTE — Telephone Encounter (Signed)
Patients September visit was coded as preventative and insurance will not cover b/c of preventative in March.  Can billing change this?

## 2016-08-06 NOTE — Telephone Encounter (Signed)
Called patient, unable to reach left message to give us a call back. 

## 2016-08-13 ENCOUNTER — Encounter: Payer: Self-pay | Admitting: Internal Medicine

## 2016-08-15 NOTE — Telephone Encounter (Signed)
Emailed coding for correct [place] charge.

## 2016-08-16 NOTE — Telephone Encounter (Signed)
Informed patient

## 2016-08-16 NOTE — Telephone Encounter (Signed)
Please inform patient we are voiding the CPE charge for 07/03/16, as he had CPE 12/30/15 and the September appt was scheduled for follow up. I would recommend scheduling their CPE for next March to help avoid this in the future.

## 2016-08-29 ENCOUNTER — Telehealth: Payer: Self-pay | Admitting: Internal Medicine

## 2016-08-29 NOTE — Telephone Encounter (Signed)
Pt called in and said that him and his wife both already had there flu shot back in sept and needs it updated on there file

## 2016-08-30 NOTE — Telephone Encounter (Signed)
These are in there

## 2016-08-30 NOTE — Addendum Note (Signed)
Addended by: Della Goo C on: 08/30/2016 08:25 AM   Modules accepted: Orders

## 2016-08-31 NOTE — Addendum Note (Signed)
Addended by: Lianne Cure A on: 08/31/2016 12:49 PM   Modules accepted: Orders

## 2016-10-03 ENCOUNTER — Other Ambulatory Visit: Payer: Self-pay | Admitting: Internal Medicine

## 2016-11-08 ENCOUNTER — Encounter: Payer: Self-pay | Admitting: Internal Medicine

## 2016-12-03 ENCOUNTER — Encounter: Payer: Self-pay | Admitting: Internal Medicine

## 2017-01-01 ENCOUNTER — Ambulatory Visit (INDEPENDENT_AMBULATORY_CARE_PROVIDER_SITE_OTHER): Payer: Medicare Other | Admitting: Internal Medicine

## 2017-01-01 ENCOUNTER — Encounter: Payer: Self-pay | Admitting: Internal Medicine

## 2017-01-01 VITALS — BP 160/80 | HR 66 | Temp 98.2°F | Ht 68.0 in | Wt 178.0 lb

## 2017-01-01 DIAGNOSIS — Z0001 Encounter for general adult medical examination with abnormal findings: Secondary | ICD-10-CM | POA: Diagnosis not present

## 2017-01-01 DIAGNOSIS — R739 Hyperglycemia, unspecified: Secondary | ICD-10-CM

## 2017-01-01 DIAGNOSIS — E785 Hyperlipidemia, unspecified: Secondary | ICD-10-CM | POA: Diagnosis not present

## 2017-01-01 DIAGNOSIS — I10 Essential (primary) hypertension: Secondary | ICD-10-CM | POA: Diagnosis not present

## 2017-01-01 NOTE — Progress Notes (Signed)
Subjective:    Patient ID: Neil [name], male    DOB: [birth year], 76 y.o.   MRN: [id number]  HPI  Here to f/u; overall doing ok,  Pt denies chest pain, increasing sob or doe, wheezing, orthopnea, PND, increased LE swelling, palpitations, dizziness or syncope.  Pt denies new neurological symptoms such as new headache, or facial or extremity weakness or numbness.  Pt denies polydipsia, polyuria, or low sugar episode.   Pt denies new neurological symptoms such as new headache, or facial or extremity weakness or numbness.   Pt states overall good compliance with meds, mostly trying to follow appropriate diet, with wt overall stable,  but little exercise however. BP at home > 140/90. No new history per pt Past Medical History:  Diagnosis Date  . ALLERGIC RHINITIS 12/04/2007  . BENIGN PROSTATIC HYPERTROPHY 06/05/2007  . BUNDLE BRANCH BLOCK, RIGHT 07/21/2009  . BURSITIS, RIGHT SHOULDER 11/06/2010  . Choledocholithiasis   . COLONIC POLYPS, HX OF 06/05/2007   tubular adenoma also 02/19/2013  . Coronary artery calcification seen on CT scan 03/07/2016  . DIABETES MELLITUS, TYPE II 12/04/2007   pt states not diabetic-no meds 02-05-13 PV  . DIVERTICULOSIS, COLON 07/21/2009  . ERECTILE DYSFUNCTION 06/05/2007  . Gallstones   . HYPERLIPIDEMIA 06/05/2007  . HYPERTENSION 06/05/2007  . Hypotension   . INGUINAL HERNIA, RIGHT, SMALL 11/06/2010  . Liver abscess 05/23/2015  . SKIN LESION 06/03/2008   Past Surgical History:  Procedure Laterality Date  . CHOLECYSTECTOMY N/A 03/02/2015   Procedure: LAPAROSCOPIC CHOLECYSTECTOMY;  Surgeon: Fanny Skates, MD;  Location: [facility];  Service: General;  Laterality: N/A;  . COLONOSCOPY    . ERCP N/A 03/01/2015   Procedure: ENDOSCOPIC RETROGRADE CHOLANGIOPANCREATOGRAPHY (ERCP);  Surgeon: Irene Shipper, MD;  Location: [facility];  Service: Endoscopy;  Laterality: N/A;  Dr Dalbert Batman wants patient to stay overnight after ERCP  . ERCP N/A 05/03/2015   Procedure: ENDOSCOPIC RETROGRADE  CHOLANGIOPANCREATOGRAPHY (ERCP);  Surgeon: Irene Shipper, MD;  Location: [facility];  Service: Endoscopy;  Laterality: N/A;  . FINGER SURGERY Left   . HEMORRHOID SURGERY    . INGUINAL HERNIA REPAIR Right    8'14 repair  . POLYPECTOMY    . TONSILLECTOMY      reports that he quit smoking about 11 years ago. He has never used smokeless tobacco. He reports that he does not drink alcohol or use drugs. family history includes Brain cancer (age of onset: 76) in his mother; Colon cancer (age of onset: 3) in his mother. Allergies  Allergen Reactions  . Statins     REACTION: muscle aches  . Zetia [Ezetimibe] Other (See Comments)    Statins= Muscle and bone pain   Current Outpatient Prescriptions on File Prior to Visit  Medication Sig Dispense Refill  . acetaminophen (TYLENOL) 500 MG tablet Take 1,500 mg by mouth 2 (two) times daily.     . Alpha-D-Galactosidase (BEANO PO) Take by mouth.    . benazepril (LOTENSIN) 40 MG tablet TAKE 1 TABLET BY MOUTH  DAILY 90 tablet 0  . cholecalciferol (VITAMIN D) 1000 units tablet Take 1,000 Units by mouth daily.    . fenofibrate 160 MG tablet TAKE 1 TABLET BY MOUTH AT  BEDTIME 90 tablet 0  . sodium chloride (OCEAN) 0.65 % nasal spray Place 1 spray into the nose daily as needed for congestion.     [name] terazosin (HYTRIN) 2 MG capsule TAKE 1 CAPSULE BY MOUTH  ONCE AS DIRECTED 90 capsule 0  No current facility-administered medications on file prior to visit.    Review of Systems  Constitutional: Negative for unusual diaphoresis or night sweats HENT: Negative for ear swelling or discharge Eyes: Negative for worsening visual haziness  Respiratory: Negative for choking and stridor.   Gastrointestinal: Negative for distension or worsening eructation Genitourinary: Negative for retention or change in urine volume.  Musculoskeletal: Negative for other MSK pain or swelling Skin: Negative for color change and worsening wound Neurological: Negative for tremors and  numbness other than noted  Psychiatric/Behavioral: Negative for decreased concentration or agitation other than above   All other system neg per pt    Objective:   Physical Exam BP (!) 160/80   Pulse 66   Temp 98.2 F (36.8 C)   Ht 5\' 8"  (1.727 m)   Wt 178 lb (80.7 kg)   SpO2 98%   BMI 27.06 kg/m  VS noted,  Constitutional: Pt appears in no apparent distress HENT: Head: NCAT.  Right Ear: External ear normal.  Left Ear: External ear normal.  Eyes: . Pupils are equal, round, and reactive to light. Conjunctivae and EOM are normal Neck: Normal range of motion. Neck supple.  Cardiovascular: Normal rate and regular rhythm.   Pulmonary/Chest: Effort normal and breath sounds without rales or wheezing.  Neurological: Pt is alert. Not confused , motor grossly intact Skin: Skin is warm. No rash, no LE edema Psychiatric: Pt behavior is normal. No agitation.  No other exam findings    Assessment & Plan:

## 2017-01-01 NOTE — Progress Notes (Signed)
Pre visit review using our clinic review tool, if applicable. No additional management support is needed unless otherwise documented below in the visit note. 

## 2017-01-01 NOTE — Patient Instructions (Signed)
Please continue all other medications as before, and refills have been done if requested.  Please have the pharmacy call with any other refills you may need.  Please continue your efforts at being more active, low cholesterol diet, and weight control..  Please keep your appointments with your specialists as you may have planned  Please return in 6 months, or sooner if needed, with Lab testing done 3-5 days before  

## 2017-01-06 NOTE — Assessment & Plan Note (Signed)
stable overall by history and exam, recent data reviewed with pt, and pt to continue medical treatment as before,  to f/u any worsening symptoms or concerns Lab Results  Component Value Date   HGBA1C 6.0 06/28/2016

## 2017-01-06 NOTE — Assessment & Plan Note (Signed)
stable overall by history and exam, recent data reviewed with pt, and pt to continue medical treatment as before,  to f/u any worsening symptoms or concerns Lab Results  Component Value Date   LDLCALC 93 07/08/2015

## 2017-01-06 NOTE — Assessment & Plan Note (Signed)
elev today but pt adamant BP at home < 140/90, o/w stable overall by history and exam, recent data reviewed with pt, and pt to continue medical treatment as before,  to f/u any worsening symptoms or concerns BP Readings from Last 3 Encounters:  01/01/17 (!) 160/80  07/03/16 (!) 142/78  04/27/16 (!) 141/69

## 2017-01-10 ENCOUNTER — Other Ambulatory Visit: Payer: Self-pay | Admitting: Internal Medicine

## 2017-01-10 MED ORDER — TERAZOSIN HCL 2 MG PO CAPS: 2.0000 mg | ORAL_CAPSULE | Freq: Every day | ORAL | 3 refills | Status: DC

## 2017-01-14 ENCOUNTER — Other Ambulatory Visit: Payer: Self-pay | Admitting: *Deleted

## 2017-01-14 MED ORDER — FENOFIBRATE 160 MG PO TABS: 160.0000 mg | ORAL_TABLET | Freq: Every day | ORAL | 3 refills | Status: DC

## 2017-01-14 MED ORDER — BENAZEPRIL HCL 40 MG PO TABS
40.0000 mg | ORAL_TABLET | Freq: Every day | ORAL | 3 refills | Status: DC
Start: 2017-01-14 — End: 2018-01-14

## 2017-01-14 MED ORDER — TERAZOSIN HCL 2 MG PO CAPS: 2.0000 mg | ORAL_CAPSULE | Freq: Every day | ORAL | 3 refills | Status: DC

## 2017-01-14 NOTE — Telephone Encounter (Signed)
Rec'd call pt states he now have to use [facility] for maintenance meds. Needing rx fax to them. Verified cahrt pt is up-to-date sent rx [facility].[name] KitchenJohny [name]

## 2017-02-25 ENCOUNTER — Telehealth: Payer: Self-pay | Admitting: Acute Care

## 2017-02-25 NOTE — Telephone Encounter (Signed)
Erro,,me'

## 2017-03-04 ENCOUNTER — Encounter: Payer: Self-pay | Admitting: Internal Medicine

## 2017-03-07 ENCOUNTER — Encounter: Payer: Self-pay | Admitting: Internal Medicine

## 2017-03-10 ENCOUNTER — Encounter: Payer: Self-pay | Admitting: Internal Medicine

## 2017-03-11 ENCOUNTER — Encounter: Payer: Self-pay | Admitting: Internal Medicine

## 2017-03-12 NOTE — Telephone Encounter (Signed)
See rx done under wife Neil [name] charting

## 2017-05-03 ENCOUNTER — Encounter (HOSPITAL_COMMUNITY): Payer: Medicare Other

## 2017-05-03 ENCOUNTER — Ambulatory Visit: Payer: Medicare Other | Admitting: Family

## 2017-07-02 ENCOUNTER — Ambulatory Visit: Payer: Medicare Other | Admitting: Internal Medicine

## 2017-07-04 ENCOUNTER — Telehealth: Payer: Self-pay | Admitting: Internal Medicine

## 2017-07-04 NOTE — Telephone Encounter (Signed)
Pt resch her CPE for 10/19, they would like to make ure the lab works orders will not expire by then and if so they would like new orders put in , please advise

## 2017-07-04 NOTE — Telephone Encounter (Signed)
Labs will be valid

## 2017-07-10 ENCOUNTER — Encounter: Payer: Medicare Other | Admitting: Internal Medicine

## 2017-07-30 ENCOUNTER — Other Ambulatory Visit (INDEPENDENT_AMBULATORY_CARE_PROVIDER_SITE_OTHER): Payer: Medicare Other

## 2017-07-30 DIAGNOSIS — Z0001 Encounter for general adult medical examination with abnormal findings: Secondary | ICD-10-CM | POA: Diagnosis not present

## 2017-07-30 DIAGNOSIS — R739 Hyperglycemia, unspecified: Secondary | ICD-10-CM | POA: Diagnosis not present

## 2017-07-30 LAB — HEPATIC FUNCTION PANEL
ALT: 17 U/L (ref 0–53)
AST: 16 U/L (ref 0–37)
Albumin: 4.3 g/dL (ref 3.5–5.2)
Alkaline Phosphatase: 30 U/L — ABNORMAL LOW (ref 39–117)
Bilirubin, Direct: 0.1 mg/dL (ref 0.0–0.3)
Total Bilirubin: 0.6 mg/dL (ref 0.2–1.2)
Total Protein: 7 g/dL (ref 6.0–8.3)

## 2017-07-30 LAB — BASIC METABOLIC PANEL
BUN: 18 mg/dL (ref 6–23)
CO2: 28 mEq/L (ref 19–32)
Calcium: 9.6 mg/dL (ref 8.4–10.5)
Chloride: 105 mEq/L (ref 96–112)
Creatinine, Ser: 1.37 mg/dL (ref 0.40–1.50)
GFR: 53.8 mL/min — ABNORMAL LOW (ref >60.00)
Glucose, Bld: 115 mg/dL — ABNORMAL HIGH (ref 70–99)
Potassium: 4.3 mEq/L (ref 3.5–5.1)
Sodium: 141 mEq/L (ref 135–145)

## 2017-07-30 LAB — LIPID PANEL
Cholesterol: 168 mg/dL (ref 0–200)
HDL: 37.6 mg/dL — ABNORMAL LOW (ref >39.00)
LDL Cholesterol: 100 mg/dL — ABNORMAL HIGH (ref 0–99)
NonHDL: 130.22
Total CHOL/HDL Ratio: 4
Triglycerides: 152 mg/dL — ABNORMAL HIGH (ref 0.0–149.0)
VLDL: 30.4 mg/dL (ref 0.0–40.0)

## 2017-07-30 LAB — CBC WITH DIFFERENTIAL/PLATELET
Basophils Absolute: 0.1 10*3/uL (ref 0.0–0.1)
Basophils Relative: 0.9 % (ref 0.0–3.0)
Eosinophils Absolute: 0.1 10*3/uL (ref 0.0–0.7)
Eosinophils Relative: 2.2 % (ref 0.0–5.0)
HCT: 43.7 % (ref 39.0–52.0)
Hemoglobin: 14.6 g/dL (ref 13.0–17.0)
Lymphocytes Relative: 26.3 % (ref 12.0–46.0)
Lymphs Abs: 1.6 10*3/uL (ref 0.7–4.0)
MCHC: 33.3 g/dL (ref 30.0–36.0)
MCV: 87 fl (ref 78.0–100.0)
Monocytes Absolute: 0.6 10*3/uL (ref 0.1–1.0)
Monocytes Relative: 9.7 % (ref 3.0–12.0)
Neutro Abs: 3.6 10*3/uL (ref 1.4–7.7)
Neutrophils Relative %: 60.9 % (ref 43.0–77.0)
Platelets: 227 10*3/uL (ref 150.0–400.0)
RBC: 5.02 Mil/uL (ref 4.22–5.81)
RDW: 14.4 % (ref 11.5–15.5)
WBC: 6 10*3/uL (ref 4.0–10.5)

## 2017-07-30 LAB — URINALYSIS, ROUTINE W REFLEX MICROSCOPIC
Bilirubin Urine: NEGATIVE
Hgb urine dipstick: NEGATIVE
Ketones, ur: NEGATIVE
Leukocytes, UA: NEGATIVE
Nitrite: NEGATIVE
RBC / HPF: NONE SEEN (ref 0–?)
Specific Gravity, Urine: 1.015 (ref 1.000–1.030)
Total Protein, Urine: NEGATIVE
Urine Glucose: NEGATIVE
Urobilinogen, UA: 0.2 (ref 0.0–1.0)
WBC, UA: NONE SEEN (ref 0–?)
pH: 5.5 (ref 5.0–8.0)

## 2017-07-30 LAB — HEMOGLOBIN A1C: Hgb A1c MFr Bld: 6.6 % — ABNORMAL HIGH (ref 4.6–6.5)

## 2017-07-30 LAB — PSA: PSA: 2.88 ng/mL (ref 0.10–4.00)

## 2017-07-30 LAB — TSH: TSH: 0.85 u[IU]/mL (ref 0.35–4.50)

## 2017-08-02 ENCOUNTER — Ambulatory Visit (INDEPENDENT_AMBULATORY_CARE_PROVIDER_SITE_OTHER): Payer: Medicare Other | Admitting: Internal Medicine

## 2017-08-02 ENCOUNTER — Encounter: Payer: Self-pay | Admitting: Internal Medicine

## 2017-08-02 ENCOUNTER — Encounter: Payer: Medicare Other | Admitting: Internal Medicine

## 2017-08-02 VITALS — BP 138/88 | HR 72 | Temp 97.9°F | Ht 68.0 in | Wt 167.0 lb

## 2017-08-02 DIAGNOSIS — Z0000 Encounter for general adult medical examination without abnormal findings: Secondary | ICD-10-CM

## 2017-08-02 DIAGNOSIS — Z23 Encounter for immunization: Secondary | ICD-10-CM

## 2017-08-02 DIAGNOSIS — R739 Hyperglycemia, unspecified: Secondary | ICD-10-CM | POA: Diagnosis not present

## 2017-08-02 DIAGNOSIS — N183 Chronic kidney disease, stage 3 unspecified: Secondary | ICD-10-CM

## 2017-08-02 DIAGNOSIS — R972 Elevated prostate specific antigen [PSA]: Secondary | ICD-10-CM | POA: Insufficient documentation

## 2017-08-02 HISTORY — DX: Chronic kidney disease, stage 3 unspecified: N18.30

## 2017-08-02 NOTE — Patient Instructions (Addendum)
You had the flu shot today  Please remember to make your yearly eye doctor appointment, as well as the Lung CT scan for lung screening  Please continue all other medications as before, and refills have been done if requested.  Please have the pharmacy call with any other refills you may need.  Please continue your efforts at being more active, low cholesterol diet, and weight control.  You are otherwise up to date with prevention measures today.  Please keep your appointments with your specialists as you may have planned  Please return in 6 months, or sooner if needed, with Lab testing done 3-5 days before

## 2017-08-02 NOTE — Progress Notes (Signed)
Subjective:    Patient ID: Neil [name], male    DOB: 29-Jun-1941, 76 y.o.   MRN: [id number]  HPI  Here for wellness and f/u;  Overall doing ok;  Pt denies Chest pain, worsening SOB, DOE, wheezing, orthopnea, PND, worsening LE edema, palpitations, dizziness or syncope.  Pt denies neurological change such as new headache, facial or extremity weakness.  Pt denies polydipsia, polyuria, or low sugar symptoms. Pt states overall good compliance with treatment and medications, good tolerability, and has been trying to follow appropriate diet.  Pt denies worsening depressive symptoms, suicidal ideation or panic. No fever, night sweats, wt loss, loss of appetite, or other constitutional symptoms.  Pt states good ability with ADL's, has low fall risk, home safety reviewed and adequate, no other significant changes in hearing or vision, and only occasionally active with exercise. Denies urinary symptoms such as dysuria, frequency, urgency, flank pain, hematuria or n/v, fever, chills.   Past Medical History:  Diagnosis Date  . ALLERGIC RHINITIS 12/04/2007  . BENIGN PROSTATIC HYPERTROPHY 06/05/2007  . BUNDLE BRANCH BLOCK, RIGHT 07/21/2009  . BURSITIS, RIGHT SHOULDER 11/06/2010  . Choledocholithiasis   . CKD (chronic kidney disease) stage 3, GFR 30-59 ml/min (HCC) 08/02/2017  . COLONIC POLYPS, HX OF 06/05/2007   tubular adenoma also 02/19/2013  . Coronary artery calcification seen on CT scan 03/07/2016  . DIABETES MELLITUS, TYPE II 12/04/2007   pt states not diabetic-no meds 02-05-13 PV  . DIVERTICULOSIS, COLON 07/21/2009  . ERECTILE DYSFUNCTION 06/05/2007  . Gallstones   . HYPERLIPIDEMIA 06/05/2007  . HYPERTENSION 06/05/2007  . Hypotension   . INGUINAL HERNIA, RIGHT, SMALL 11/06/2010  . Liver abscess 05/23/2015  . SKIN LESION 06/03/2008   Past Surgical History:  Procedure Laterality Date  . CHOLECYSTECTOMY N/A 03/02/2015   Procedure: LAPAROSCOPIC CHOLECYSTECTOMY;  Surgeon: Fanny Skates, MD;  Location: [facility];   Service: General;  Laterality: N/A;  . COLONOSCOPY    . ERCP N/A 03/01/2015   Procedure: ENDOSCOPIC RETROGRADE CHOLANGIOPANCREATOGRAPHY (ERCP);  Surgeon: Irene Shipper, MD;  Location: [facility];  Service: Endoscopy;  Laterality: N/A;  Dr Dalbert Batman wants patient to stay overnight after ERCP  . ERCP N/A 05/03/2015   Procedure: ENDOSCOPIC RETROGRADE CHOLANGIOPANCREATOGRAPHY (ERCP);  Surgeon: Irene Shipper, MD;  Location: [facility];  Service: Endoscopy;  Laterality: N/A;  . FINGER SURGERY Left   . HEMORRHOID SURGERY    . INGUINAL HERNIA REPAIR Right    8'14 repair  . POLYPECTOMY    . TONSILLECTOMY      reports that he quit smoking about 11 years ago. He has never used smokeless tobacco. He reports that he does not drink alcohol or use drugs. family history includes Brain cancer (age of onset: 65) in his mother; Colon cancer (age of onset: 23) in his mother. Allergies  Allergen Reactions  . Statins     REACTION: muscle aches  . Zetia [Ezetimibe] Other (See Comments)    Statins= Muscle and bone pain   Current Outpatient Prescriptions on File Prior to Visit  Medication Sig Dispense Refill  . acetaminophen (TYLENOL) 500 MG tablet Take 1,500 mg by mouth 2 (two) times daily.     . Alpha-D-Galactosidase (BEANO PO) Take by mouth.    . benazepril (LOTENSIN) 40 MG tablet Take 1 tablet (40 mg total) by mouth daily. 90 tablet 3  . cholecalciferol (VITAMIN D) 1000 units tablet Take 1,000 Units by mouth daily.    . fenofibrate 160 MG tablet Take 1 tablet (160 mg  total) by mouth at bedtime. 90 tablet 3  . sodium chloride (OCEAN) 0.65 % nasal spray Place 1 spray into the nose daily as needed for congestion.     [name] terazosin (HYTRIN) 2 MG capsule Take 1 capsule (2 mg total) by mouth daily. 90 capsule 3   No current facility-administered medications on file prior to visit.    Review of Systems Constitutional: Negative for other unusual diaphoresis, sweats, appetite or weight changes HENT: Negative for  other worsening hearing loss, ear pain, facial swelling, mouth sores or neck stiffness.   Eyes: Negative for other worsening pain, redness or other visual disturbance.  Respiratory: Negative for other stridor or swelling Cardiovascular: Negative for other palpitations or other chest pain  Gastrointestinal: Negative for worsening diarrhea or loose stools, blood in stool, distention or other pain Genitourinary: Negative for hematuria, flank pain or other change in urine volume.  Musculoskeletal: Negative for myalgias or other joint swelling.  Skin: Negative for other color change, or other wound or worsening drainage.  Neurological: Negative for other syncope or numbness. Hematological: Negative for other adenopathy or swelling Psychiatric/Behavioral: Negative for hallucinations, other worsening agitation, SI, self-injury, or new decreased concentration All other system neg per pt    Objective:   Physical Exam BP 138/88   Pulse 72   Temp 97.9 F (36.6 C) (Oral)   Ht 5\' 8"  (1.727 m)   Wt 167 lb (75.8 kg)   SpO2 98%   BMI 25.39 kg/m  \VS noted,  Constitutional: Pt is oriented to person, place, and time. Appears well-developed and well-nourished, in no significant distress and comfortable Head: Normocephalic and atraumatic  Eyes: Conjunctivae and EOM are normal. Pupils are equal, round, and reactive to light Right Ear: External ear normal without discharge Left Ear: External ear normal without discharge Nose: Nose without discharge or deformity Mouth/Throat: Oropharynx is without other ulcerations and moist  Neck: Normal range of motion. Neck supple. No JVD present. No tracheal deviation present or significant neck LA or mass Cardiovascular: Normal rate, regular rhythm, normal heart sounds and intact distal pulses.   Pulmonary/Chest: WOB normal and breath sounds without rales or wheezing  Abdominal: Soft. Bowel sounds are normal. NT. No HSM  Musculoskeletal: Normal range of motion.  Exhibits no edema Lymphadenopathy: Has no other cervical adenopathy.  Neurological: Pt is alert and oriented to person, place, and time. Pt has normal reflexes. No cranial nerve deficit. Motor grossly intact, Gait intact Skin: Skin is warm and dry. No rash noted or new ulcerations Psychiatric:  Has normal mood and affect. Behavior is normal without agitation No other exam findings Lab Results  Component Value Date   WBC 6.0 07/30/2017   HGB 14.6 07/30/2017   HCT 43.7 07/30/2017   PLT 227.0 07/30/2017   GLUCOSE 115 (H) 07/30/2017   CHOL 168 07/30/2017   TRIG 152.0 (H) 07/30/2017   HDL 37.60 (L) 07/30/2017   LDLDIRECT 107.0 06/28/2016   LDLCALC 100 (H) 07/30/2017   ALT 17 07/30/2017   AST 16 07/30/2017   NA 141 07/30/2017   K 4.3 07/30/2017   CL 105 07/30/2017   CREATININE 1.37 07/30/2017   BUN 18 07/30/2017   CO2 28 07/30/2017   TSH 0.85 07/30/2017   PSA 2.88 07/30/2017   INR 1.25 05/23/2015   HGBA1C 6.6 (H) 07/30/2017   MICROALBUR 5.6 (H) 07/08/2015       Assessment & Plan:

## 2017-08-02 NOTE — Assessment & Plan Note (Signed)
stable overall by history and exam, recent data reviewed with pt, and pt to continue medical treatment as before,  to f/u any worsening symptoms or concerns  

## 2017-08-02 NOTE — Assessment & Plan Note (Signed)
stable overall by history and exam, recent data reviewed with pt, and pt to continue medical treatment as before,  to f/u any worsening symptoms or concerns Lab Results  Component Value Date   HGBA1C 6.6 (H) 07/30/2017

## 2017-08-02 NOTE — Assessment & Plan Note (Signed)

## 2017-08-02 NOTE — Assessment & Plan Note (Signed)
Borderline increased, will f/u with PSA at 6 mo with next labs

## 2017-10-10 ENCOUNTER — Other Ambulatory Visit: Payer: Self-pay | Admitting: Acute Care

## 2017-10-10 DIAGNOSIS — Z87891 Personal history of nicotine dependence: Secondary | ICD-10-CM

## 2017-10-10 DIAGNOSIS — Z122 Encounter for screening for malignant neoplasm of respiratory organs: Secondary | ICD-10-CM

## 2018-01-03 ENCOUNTER — Other Ambulatory Visit (INDEPENDENT_AMBULATORY_CARE_PROVIDER_SITE_OTHER): Payer: Medicare Other

## 2018-01-03 DIAGNOSIS — N183 Chronic kidney disease, stage 3 unspecified: Secondary | ICD-10-CM

## 2018-01-03 DIAGNOSIS — R739 Hyperglycemia, unspecified: Secondary | ICD-10-CM | POA: Diagnosis not present

## 2018-01-03 DIAGNOSIS — R972 Elevated prostate specific antigen [PSA]: Secondary | ICD-10-CM

## 2018-01-03 LAB — LIPID PANEL
Cholesterol: 176 mg/dL (ref 0–200)
HDL: 44.2 mg/dL (ref >39.00)
LDL Cholesterol: 98 mg/dL (ref 0–99)
NonHDL: 132.24
Total CHOL/HDL Ratio: 4
Triglycerides: 171 mg/dL — ABNORMAL HIGH (ref 0.0–149.0)
VLDL: 34.2 mg/dL (ref 0.0–40.0)

## 2018-01-03 LAB — BASIC METABOLIC PANEL
BUN: 21 mg/dL (ref 6–23)
CO2: 25 mEq/L (ref 19–32)
Calcium: 10.2 mg/dL (ref 8.4–10.5)
Chloride: 108 mEq/L (ref 96–112)
Creatinine, Ser: 1.44 mg/dL (ref 0.40–1.50)
GFR: 50.74 mL/min — ABNORMAL LOW (ref >60.00)
Glucose, Bld: 145 mg/dL — ABNORMAL HIGH (ref 70–99)
Potassium: 4.4 mEq/L (ref 3.5–5.1)
Sodium: 146 mEq/L — ABNORMAL HIGH (ref 135–145)

## 2018-01-03 LAB — HEPATIC FUNCTION PANEL
ALT: 17 U/L (ref 0–53)
AST: 19 U/L (ref 0–37)
Albumin: 4.7 g/dL (ref 3.5–5.2)
Alkaline Phosphatase: 37 U/L — ABNORMAL LOW (ref 39–117)
Bilirubin, Direct: 0.1 mg/dL (ref 0.0–0.3)
Total Bilirubin: 0.5 mg/dL (ref 0.2–1.2)
Total Protein: 7.7 g/dL (ref 6.0–8.3)

## 2018-01-03 LAB — PSA: PSA: 3.13 ng/mL (ref 0.10–4.00)

## 2018-01-03 LAB — HEMOGLOBIN A1C: Hgb A1c MFr Bld: 6.6 % — ABNORMAL HIGH (ref 4.6–6.5)

## 2018-01-06 ENCOUNTER — Ambulatory Visit (INDEPENDENT_AMBULATORY_CARE_PROVIDER_SITE_OTHER): Payer: Medicare Other | Admitting: Internal Medicine

## 2018-01-06 ENCOUNTER — Encounter: Payer: Self-pay | Admitting: Internal Medicine

## 2018-01-06 VITALS — BP 126/82 | HR 75 | Temp 98.5°F | Ht 68.0 in | Wt 173.0 lb

## 2018-01-06 DIAGNOSIS — E785 Hyperlipidemia, unspecified: Secondary | ICD-10-CM | POA: Diagnosis not present

## 2018-01-06 DIAGNOSIS — R739 Hyperglycemia, unspecified: Secondary | ICD-10-CM

## 2018-01-06 DIAGNOSIS — R972 Elevated prostate specific antigen [PSA]: Secondary | ICD-10-CM | POA: Diagnosis not present

## 2018-01-06 DIAGNOSIS — Z0000 Encounter for general adult medical examination without abnormal findings: Secondary | ICD-10-CM

## 2018-01-06 DIAGNOSIS — E876 Hypokalemia: Secondary | ICD-10-CM | POA: Insufficient documentation

## 2018-01-06 DIAGNOSIS — I10 Essential (primary) hypertension: Secondary | ICD-10-CM

## 2018-01-06 MED ORDER — POTASSIUM CHLORIDE ER 10 MEQ PO TBCR: 10.0000 meq | EXTENDED_RELEASE_TABLET | Freq: Every day | ORAL | 3 refills | Status: DC

## 2018-01-06 NOTE — Assessment & Plan Note (Signed)
Also for f/u psa due to ongoing trend Lab Results  Component Value Date   PSA 3.13 01/03/2018   PSA 2.88 07/30/2017   PSA 2.46 12/23/2015

## 2018-01-06 NOTE — Assessment & Plan Note (Signed)
Lab Results  Component Value Date   LDLCALC 98 01/03/2018  stable overall by history and exam, recent data reviewed with pt, and pt to continue medical treatment as before,  to f/u any worsening symptoms or concerns

## 2018-01-06 NOTE — Progress Notes (Signed)
Subjective:    Patient ID: Neil [name], male    DOB: 13-Mar-1941, 77 y.o.   MRN: [id number]  HPI  Here to f/u; overall doing ok,  Pt denies chest pain, increasing sob or doe, wheezing, orthopnea, PND, increased LE swelling, palpitations, dizziness or syncope.  Pt denies new neurological symptoms such as new headache, or facial or extremity weakness or numbness.  Pt denies polydipsia, polyuria, or low sugar episode.  Pt states overall good compliance with meds, mostly trying to follow appropriate diet, with wt overall stable,  but little exercise however. Denies urinary symptoms such as dysuria, frequency, urgency, flank pain, hematuria or n/v, fever, chills.  No new complaints Past Medical History:  Diagnosis Date  . ALLERGIC RHINITIS 12/04/2007  . BENIGN PROSTATIC HYPERTROPHY 06/05/2007  . BUNDLE BRANCH BLOCK, RIGHT 07/21/2009  . BURSITIS, RIGHT SHOULDER 11/06/2010  . Choledocholithiasis   . CKD (chronic kidney disease) stage 3, GFR 30-59 ml/min (HCC) 08/02/2017  . COLONIC POLYPS, HX OF 06/05/2007   tubular adenoma also 02/19/2013  . Coronary artery calcification seen on CT scan 03/07/2016  . DIABETES MELLITUS, TYPE II 12/04/2007   pt states not diabetic-no meds 02-05-13 PV  . DIVERTICULOSIS, COLON 07/21/2009  . ERECTILE DYSFUNCTION 06/05/2007  . Gallstones   . HYPERLIPIDEMIA 06/05/2007  . HYPERTENSION 06/05/2007  . Hypotension   . INGUINAL HERNIA, RIGHT, SMALL 11/06/2010  . Liver abscess 05/23/2015  . SKIN LESION 06/03/2008   Past Surgical History:  Procedure Laterality Date  . CHOLECYSTECTOMY N/A 03/02/2015   Procedure: LAPAROSCOPIC CHOLECYSTECTOMY;  Surgeon: Fanny Skates, MD;  Location: [facility];  Service: General;  Laterality: N/A;  . COLONOSCOPY    . ERCP N/A 03/01/2015   Procedure: ENDOSCOPIC RETROGRADE CHOLANGIOPANCREATOGRAPHY (ERCP);  Surgeon: Irene Shipper, MD;  Location: [facility];  Service: Endoscopy;  Laterality: N/A;  Dr Dalbert Batman wants patient to stay overnight after ERCP  . ERCP  N/A 05/03/2015   Procedure: ENDOSCOPIC RETROGRADE CHOLANGIOPANCREATOGRAPHY (ERCP);  Surgeon: Irene Shipper, MD;  Location: [facility];  Service: Endoscopy;  Laterality: N/A;  . FINGER SURGERY Left   . HEMORRHOID SURGERY    . INGUINAL HERNIA REPAIR Right    8'14 repair  . POLYPECTOMY    . TONSILLECTOMY      reports that he quit smoking about 12 years ago. He has never used smokeless tobacco. He reports that he does not drink alcohol or use drugs. family history includes Brain cancer (age of onset: 16) in his mother; Colon cancer (age of onset: 89) in his mother. Allergies  Allergen Reactions  . Statins     REACTION: muscle aches  . Zetia [Ezetimibe] Other (See Comments)    Statins= Muscle and bone pain   Current Outpatient Medications on File Prior to Visit  Medication Sig Dispense Refill  . acetaminophen (TYLENOL) 500 MG tablet Take 1,500 mg by mouth 2 (two) times daily.     . Alpha-D-Galactosidase (BEANO PO) Take by mouth.    . benazepril (LOTENSIN) 40 MG tablet Take 1 tablet (40 mg total) by mouth daily. 90 tablet 3  . cholecalciferol (VITAMIN D) 1000 units tablet Take 1,000 Units by mouth daily.    . fenofibrate 160 MG tablet Take 1 tablet (160 mg total) by mouth at bedtime. 90 tablet 3  . sodium chloride (OCEAN) 0.65 % nasal spray Place 1 spray into the nose daily as needed for congestion.     [name] terazosin (HYTRIN) 2 MG capsule Take 1 capsule (2 mg total) by  mouth daily. 90 capsule 3   No current facility-administered medications on file prior to visit.    Review of Systems  Constitutional: Negative for other unusual diaphoresis or sweats HENT: Negative for ear discharge or swelling Eyes: Negative for other worsening visual disturbances Respiratory: Negative for stridor or other swelling  Gastrointestinal: Negative for worsening distension or other blood Genitourinary: Negative for retention or other urinary change Musculoskeletal: Negative for other MSK pain or  swelling Skin: Negative for color change or other new lesions Neurological: Negative for worsening tremors and other numbness  Psychiatric/Behavioral: Negative for worsening agitation or other fatigue All other system neg per pt    Objective:   Physical Exam BP 126/82   Pulse 75   Temp 98.5 F (36.9 C) (Oral)   Ht 5\' 8"  (1.727 m)   Wt 173 lb (78.5 kg)   SpO2 94%   BMI 26.30 kg/m  VS noted,  Constitutional: Pt appears in NAD HENT: Head: NCAT.  Right Ear: External ear normal.  Left Ear: External ear normal.  Eyes: . Pupils are equal, round, and reactive to light. Conjunctivae and EOM are normal Nose: without d/c or deformity Neck: Neck supple. Gross normal ROM Cardiovascular: Normal rate and regular rhythm.   Pulmonary/Chest: Effort normal and breath sounds without rales or wheezing.  Abd:  Soft, NT, ND, + BS, no organomegaly Neurological: Pt is alert. At baseline orientation, motor grossly intact Skin: Skin is warm. No rashes, other new lesions, no LE edema Psychiatric: Pt behavior is normal without agitation  No other exam findings  Lab Results  Component Value Date   WBC 6.0 07/30/2017   HGB 14.6 07/30/2017   HCT 43.7 07/30/2017   PLT 227.0 07/30/2017   GLUCOSE 145 (H) 01/03/2018   CHOL 176 01/03/2018   TRIG 171.0 (H) 01/03/2018   HDL 44.20 01/03/2018   LDLDIRECT 107.0 06/28/2016   LDLCALC 98 01/03/2018   ALT 17 01/03/2018   AST 19 01/03/2018   NA 146 (H) 01/03/2018   K 4.4 01/03/2018   CL 108 01/03/2018   CREATININE 1.44 01/03/2018   BUN 21 01/03/2018   CO2 25 01/03/2018   TSH 0.85 07/30/2017   PSA 3.13 01/03/2018   INR 1.25 05/23/2015   HGBA1C 6.6 (H) 01/03/2018   MICROALBUR 5.6 (H) 07/08/2015        Assessment & Plan:

## 2018-01-06 NOTE — Assessment & Plan Note (Signed)
stable overall by history and exam, recent data reviewed with pt, and pt to continue medical treatment as before,  to f/u any worsening symptoms or concerns Lab Results  Component Value Date   HGBA1C 6.6 (H) 01/03/2018

## 2018-01-06 NOTE — Patient Instructions (Addendum)
Please do not take the potassium prescription accidentally sent to your pharmacy today  Please continue all other medications as before, and refills have been done if requested.  Please have the pharmacy call with any other refills you may need.  Please continue your efforts at being more active, low cholesterol diet, and weight control.  You are otherwise up to date with prevention measures today.  Please keep your appointments with your specialists as you may have planned  Please return in 6 months, or sooner if needed, with Lab testing done 3-5 days before

## 2018-01-06 NOTE — Assessment & Plan Note (Signed)
stable overall by history and exam, recent data reviewed with pt, and pt to continue medical treatment as before,  to f/u any worsening symptoms or concerns BP Readings from Last 3 Encounters:  01/06/18 126/82  08/02/17 138/88  01/01/17 (!) 160/80

## 2018-01-14 ENCOUNTER — Encounter: Payer: Self-pay | Admitting: Internal Medicine

## 2018-01-14 MED ORDER — FENOFIBRATE 160 MG PO TABS: 160.0000 mg | ORAL_TABLET | Freq: Every day | ORAL | 3 refills | Status: DC

## 2018-01-14 MED ORDER — TERAZOSIN HCL 2 MG PO CAPS: 2.0000 mg | ORAL_CAPSULE | Freq: Every day | ORAL | 3 refills | Status: DC

## 2018-01-14 MED ORDER — BENAZEPRIL HCL 40 MG PO TABS: 40.0000 mg | ORAL_TABLET | Freq: Every day | ORAL | 3 refills | Status: DC

## 2018-01-15 NOTE — Telephone Encounter (Signed)
appt cancelled

## 2018-04-21 ENCOUNTER — Telehealth: Payer: Self-pay | Admitting: Internal Medicine

## 2018-04-21 NOTE — Telephone Encounter (Signed)
Copied from [place] [phone]. Topic: Quick Communication - Rx Refill/Question >> Apr 21, 2018  1:41 PM Tye [place] wrote: Medication: fenofibrate 160 MG tablet [[id number]]  benazepril (LOTENSIN) 40 MG tablet [[id number]]  terazosin (HYTRIN) 2 MG capsule [[id number]]   Has the patient contacted their pharmacy? Yes.   (Agent: If no, request that the patient contact the pharmacy for the refill.) (Agent: If yes, when and what did the pharmacy advise?)  Preferred Pharmacy (with phone number or street name): [facility]   Agent: Please be advised that RX refills may take up to 3 business days. We ask that you follow-up with your pharmacy.

## 2018-04-22 NOTE — Telephone Encounter (Signed)
Patient has got his refills- he had problem with computer reordering.

## 2018-05-28 DIAGNOSIS — M719 Bursopathy, unspecified: Secondary | ICD-10-CM | POA: Insufficient documentation

## 2018-06-21 DIAGNOSIS — H2513 Age-related nuclear cataract, bilateral: Secondary | ICD-10-CM | POA: Diagnosis not present

## 2018-06-21 DIAGNOSIS — H40033 Anatomical narrow angle, bilateral: Secondary | ICD-10-CM | POA: Diagnosis not present

## 2018-06-30 DIAGNOSIS — H1013 Acute atopic conjunctivitis, bilateral: Secondary | ICD-10-CM | POA: Diagnosis not present

## 2018-07-04 ENCOUNTER — Other Ambulatory Visit (INDEPENDENT_AMBULATORY_CARE_PROVIDER_SITE_OTHER): Payer: Medicare Other

## 2018-07-04 DIAGNOSIS — Z0000 Encounter for general adult medical examination without abnormal findings: Secondary | ICD-10-CM | POA: Diagnosis not present

## 2018-07-04 DIAGNOSIS — E785 Hyperlipidemia, unspecified: Secondary | ICD-10-CM | POA: Diagnosis not present

## 2018-07-04 DIAGNOSIS — R739 Hyperglycemia, unspecified: Secondary | ICD-10-CM

## 2018-07-04 LAB — CBC WITH DIFFERENTIAL/PLATELET
Basophils Absolute: 0 10*3/uL (ref 0.0–0.1)
Basophils Relative: 0.6 % (ref 0.0–3.0)
Eosinophils Absolute: 0.2 10*3/uL (ref 0.0–0.7)
Eosinophils Relative: 2.9 % (ref 0.0–5.0)
HCT: 42.8 % (ref 39.0–52.0)
Hemoglobin: 14 g/dL (ref 13.0–17.0)
Lymphocytes Relative: 22.8 % (ref 12.0–46.0)
Lymphs Abs: 1.3 10*3/uL (ref 0.7–4.0)
MCHC: 32.8 g/dL (ref 30.0–36.0)
MCV: 86.8 fl (ref 78.0–100.0)
Monocytes Absolute: 0.8 10*3/uL (ref 0.1–1.0)
Monocytes Relative: 13.8 % — ABNORMAL HIGH (ref 3.0–12.0)
Neutro Abs: 3.4 10*3/uL (ref 1.4–7.7)
Neutrophils Relative %: 59.9 % (ref 43.0–77.0)
Platelets: 220 10*3/uL (ref 150.0–400.0)
RBC: 4.93 Mil/uL (ref 4.22–5.81)
RDW: 14.8 % (ref 11.5–15.5)
WBC: 5.6 10*3/uL (ref 4.0–10.5)

## 2018-07-04 LAB — TSH: TSH: 1.06 u[IU]/mL (ref 0.35–4.50)

## 2018-07-04 LAB — LIPID PANEL
Cholesterol: 152 mg/dL (ref 0–200)
HDL: 38.1 mg/dL — ABNORMAL LOW (ref >39.00)
LDL Cholesterol: 92 mg/dL (ref 0–99)
NonHDL: 113.55
Total CHOL/HDL Ratio: 4
Triglycerides: 109 mg/dL (ref 0.0–149.0)
VLDL: 21.8 mg/dL (ref 0.0–40.0)

## 2018-07-04 LAB — HEPATIC FUNCTION PANEL
ALT: 13 U/L (ref 0–53)
AST: 16 U/L (ref 0–37)
Albumin: 4.2 g/dL (ref 3.5–5.2)
Alkaline Phosphatase: 29 U/L — ABNORMAL LOW (ref 39–117)
Bilirubin, Direct: 0.2 mg/dL (ref 0.0–0.3)
Total Bilirubin: 0.7 mg/dL (ref 0.2–1.2)
Total Protein: 6.8 g/dL (ref 6.0–8.3)

## 2018-07-04 LAB — BASIC METABOLIC PANEL
BUN: 16 mg/dL (ref 6–23)
CO2: 29 mEq/L (ref 19–32)
Calcium: 9.5 mg/dL (ref 8.4–10.5)
Chloride: 107 mEq/L (ref 96–112)
Creatinine, Ser: 1.38 mg/dL (ref 0.40–1.50)
GFR: 53.22 mL/min — ABNORMAL LOW (ref >60.00)
Glucose, Bld: 130 mg/dL — ABNORMAL HIGH (ref 70–99)
Potassium: 4.8 mEq/L (ref 3.5–5.1)
Sodium: 143 mEq/L (ref 135–145)

## 2018-07-04 LAB — URINALYSIS, ROUTINE W REFLEX MICROSCOPIC
Bilirubin Urine: NEGATIVE
Hgb urine dipstick: NEGATIVE
Ketones, ur: NEGATIVE
Leukocytes, UA: NEGATIVE
Nitrite: NEGATIVE
RBC / HPF: NONE SEEN (ref 0–?)
Specific Gravity, Urine: 1.02 (ref 1.000–1.030)
Total Protein, Urine: NEGATIVE
Urine Glucose: NEGATIVE
Urobilinogen, UA: 0.2 (ref 0.0–1.0)
pH: 6 (ref 5.0–8.0)

## 2018-07-04 LAB — HEMOGLOBIN A1C: Hgb A1c MFr Bld: 6.6 % — ABNORMAL HIGH (ref 4.6–6.5)

## 2018-07-04 LAB — PSA: PSA: 1.87 ng/mL (ref 0.10–4.00)

## 2018-07-10 ENCOUNTER — Ambulatory Visit (INDEPENDENT_AMBULATORY_CARE_PROVIDER_SITE_OTHER): Payer: Medicare Other | Admitting: Internal Medicine

## 2018-07-10 ENCOUNTER — Encounter: Payer: Self-pay | Admitting: Internal Medicine

## 2018-07-10 VITALS — BP 116/74 | HR 73 | Temp 99.1°F | Resp 16 | Wt 173.0 lb

## 2018-07-10 DIAGNOSIS — R739 Hyperglycemia, unspecified: Secondary | ICD-10-CM | POA: Diagnosis not present

## 2018-07-10 DIAGNOSIS — Z0000 Encounter for general adult medical examination without abnormal findings: Secondary | ICD-10-CM | POA: Diagnosis not present

## 2018-07-10 DIAGNOSIS — Z23 Encounter for immunization: Secondary | ICD-10-CM | POA: Diagnosis not present

## 2018-07-10 NOTE — Progress Notes (Signed)
Subjective:    Patient ID: Neil [name], male    DOB: [birth year], 77 y.o.   MRN: [id number]  HPI  Here for wellness and f/u;  Overall doing ok;  Pt denies Chest pain, worsening SOB, DOE, wheezing, orthopnea, PND, worsening LE edema, palpitations, dizziness or syncope.  Pt denies neurological change such as new headache, facial or extremity weakness.  Pt denies polydipsia, polyuria, or low sugar symptoms. Pt states overall good compliance with treatment and medications, good tolerability, and has been trying to follow appropriate diet.  Pt denies worsening depressive symptoms, suicidal ideation or panic. No fever, night sweats, wt loss, loss of appetite, or other constitutional symptoms.  Pt states good ability with ADL's, has low fall risk, home safety reviewed and adequate, no other significant changes in hearing or vision, and only occasionally active with exercise.  No new complaints Past Medical History:  Diagnosis Date  . ALLERGIC RHINITIS 12/04/2007  . BENIGN PROSTATIC HYPERTROPHY 06/05/2007  . BUNDLE BRANCH BLOCK, RIGHT 07/21/2009  . BURSITIS, RIGHT SHOULDER 11/06/2010  . Choledocholithiasis   . CKD (chronic kidney disease) stage 3, GFR 30-59 ml/min (HCC) 08/02/2017  . COLONIC POLYPS, HX OF 06/05/2007   tubular adenoma also 02/19/2013  . Coronary artery calcification seen on CT scan 03/07/2016  . DIABETES MELLITUS, TYPE II 12/04/2007   pt states not diabetic-no meds 02-05-13 PV  . DIVERTICULOSIS, COLON 07/21/2009  . ERECTILE DYSFUNCTION 06/05/2007  . Gallstones   . HYPERLIPIDEMIA 06/05/2007  . HYPERTENSION 06/05/2007  . Hypotension   . INGUINAL HERNIA, RIGHT, SMALL 11/06/2010  . Liver abscess 05/23/2015  . SKIN LESION 06/03/2008   Past Surgical History:  Procedure Laterality Date  . CHOLECYSTECTOMY N/A 03/02/2015   Procedure: LAPAROSCOPIC CHOLECYSTECTOMY;  Surgeon: Fanny Skates, MD;  Location: [facility];  Service: General;  Laterality: N/A;  . COLONOSCOPY    . ERCP N/A 03/01/2015   Procedure: ENDOSCOPIC RETROGRADE CHOLANGIOPANCREATOGRAPHY (ERCP);  Surgeon: Irene Shipper, MD;  Location: [facility];  Service: Endoscopy;  Laterality: N/A;  Dr Dalbert Batman wants patient to stay overnight after ERCP  . ERCP N/A 05/03/2015   Procedure: ENDOSCOPIC RETROGRADE CHOLANGIOPANCREATOGRAPHY (ERCP);  Surgeon: Irene Shipper, MD;  Location: [facility];  Service: Endoscopy;  Laterality: N/A;  . FINGER SURGERY Left   . HEMORRHOID SURGERY    . INGUINAL HERNIA REPAIR Right    8'14 repair  . POLYPECTOMY    . TONSILLECTOMY      reports that he quit smoking about 12 years ago. He has never used smokeless tobacco. He reports that he does not drink alcohol or use drugs. family history includes Brain cancer (age of onset: 95) in his mother; Colon cancer (age of onset: 63) in his mother. Allergies  Allergen Reactions  . Statins     REACTION: muscle aches  . Zetia [Ezetimibe] Other (See Comments)    Statins= Muscle and bone pain   Current Outpatient Medications on File Prior to Visit  Medication Sig Dispense Refill  . acetaminophen (TYLENOL) 500 MG tablet Take 1,500 mg by mouth 2 (two) times daily.     . Alpha-D-Galactosidase (BEANO PO) Take by mouth.    . benazepril (LOTENSIN) 40 MG tablet Take 1 tablet (40 mg total) by mouth daily. 90 tablet 3  . cholecalciferol (VITAMIN D) 1000 units tablet Take 1,000 Units by mouth daily.    . fenofibrate 160 MG tablet Take 1 tablet (160 mg total) by mouth at bedtime. 90 tablet 3  . sodium chloride (OCEAN) 0.65 %  nasal spray Place 1 spray into the nose daily as needed for congestion.     [name] terazosin (HYTRIN) 2 MG capsule Take 1 capsule (2 mg total) by mouth daily. 90 capsule 3   No current facility-administered medications on file prior to visit.    Review of Systems Constitutional: Negative for other unusual diaphoresis, sweats, appetite or weight changes HENT: Negative for other worsening hearing loss, ear pain, facial swelling, mouth sores or neck  stiffness.   Eyes: Negative for other worsening pain, redness or other visual disturbance.  Respiratory: Negative for other stridor or swelling Cardiovascular: Negative for other palpitations or other chest pain  Gastrointestinal: Negative for worsening diarrhea or loose stools, blood in stool, distention or other pain Genitourinary: Negative for hematuria, flank pain or other change in urine volume.  Musculoskeletal: Negative for myalgias or other joint swelling.  Skin: Negative for other color change, or other wound or worsening drainage.  Neurological: Negative for other syncope or numbness. Hematological: Negative for other adenopathy or swelling Psychiatric/Behavioral: Negative for hallucinations, other worsening agitation, SI, self-injury, or new decreased concentration All other system neg per pt    Objective:   Physical Exam BP 116/74   Pulse 73   Temp 99.1 F (37.3 C) (Oral)   Resp 16   Wt 173 lb (78.5 kg)   SpO2 96%   BMI 26.30 kg/m  VS noted,  Constitutional: Pt is oriented to person, place, and time. Appears well-developed and well-nourished, in no significant distress and comfortable Head: Normocephalic and atraumatic  Eyes: Conjunctivae and EOM are normal. Pupils are equal, round, and reactive to light Right Ear: External ear normal without discharge Left Ear: External ear normal without discharge Nose: Nose without discharge or deformity Mouth/Throat: Oropharynx is without other ulcerations and moist  Neck: Normal range of motion. Neck supple. No JVD present. No tracheal deviation present or significant neck LA or mass Cardiovascular: Normal rate, regular rhythm, normal heart sounds and intact distal pulses.   Pulmonary/Chest: WOB normal and breath sounds without rales or wheezing  Abdominal: Soft. Bowel sounds are normal. NT. No HSM  Musculoskeletal: Normal range of motion. Exhibits no edema Lymphadenopathy: Has no other cervical adenopathy.  Neurological: Pt is  alert and oriented to person, place, and time. Pt has normal reflexes. No cranial nerve deficit. Motor grossly intact, Gait intact Skin: Skin is warm and dry. No rash noted or new ulcerations Psychiatric:  Has normal mood and affect. Behavior is normal without agitation No other exam findings Lab Results  Component Value Date   WBC 5.6 07/04/2018   HGB 14.0 07/04/2018   HCT 42.8 07/04/2018   PLT 220.0 07/04/2018   GLUCOSE 130 (H) 07/04/2018   CHOL 152 07/04/2018   TRIG 109.0 07/04/2018   HDL 38.10 (L) 07/04/2018   LDLDIRECT 107.0 06/28/2016   LDLCALC 92 07/04/2018   ALT 13 07/04/2018   AST 16 07/04/2018   NA 143 07/04/2018   K 4.8 07/04/2018   CL 107 07/04/2018   CREATININE 1.38 07/04/2018   BUN 16 07/04/2018   CO2 29 07/04/2018   TSH 1.06 07/04/2018   PSA 1.87 07/04/2018   INR 1.25 05/23/2015   HGBA1C 6.6 (H) 07/04/2018   MICROALBUR 5.6 (H) 07/08/2015       Assessment & Plan:

## 2018-07-10 NOTE — Patient Instructions (Addendum)
You had the flu shot today  Please continue all other medications as before, and refills have been done if requested.  Please have the pharmacy call with any other refills you may need.  Please continue your efforts at being more active, low cholesterol diet, and weight control.  You are otherwise up to date with prevention measures today.  Please keep your appointments with your specialists as you may have planned  Please return in 6 months, or sooner if needed, with Lab testing done 3-5 days before  

## 2018-07-11 NOTE — Assessment & Plan Note (Signed)
stable overall by history and exam, recent data reviewed with pt, and pt to continue medical treatment as before,  to f/u any worsening symptoms or concerns  

## 2018-07-11 NOTE — Assessment & Plan Note (Signed)

## 2018-10-22 ENCOUNTER — Telehealth: Payer: Self-pay

## 2018-10-22 NOTE — Telephone Encounter (Signed)
No, pt needs an OV for an antibiotic per office policy.   Copied from [place] [phone]. 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 He would like to know if he can get a Z-Pack. Please advise  Best call back is [phone]

## 2018-10-23 NOTE — Telephone Encounter (Signed)
Patient refused to set up an appointment. He has been inform of E visits, but does not want to pay the co pay for that.

## 2018-10-23 NOTE — Telephone Encounter (Signed)
Noted  

## 2018-12-24 ENCOUNTER — Encounter: Payer: Medicare Other | Admitting: Internal Medicine

## 2019-01-15 ENCOUNTER — Encounter: Payer: Self-pay | Admitting: Internal Medicine

## 2019-01-18 ENCOUNTER — Encounter: Payer: Self-pay | Admitting: Internal Medicine

## 2019-01-21 MED ORDER — FENOFIBRATE 160 MG PO TABS: 160.0000 mg | ORAL_TABLET | Freq: Every day | ORAL | 3 refills | Status: DC

## 2019-01-21 MED ORDER — BENAZEPRIL HCL 40 MG PO TABS: 40.0000 mg | ORAL_TABLET | Freq: Every day | ORAL | 3 refills | Status: DC

## 2019-01-21 MED ORDER — TERAZOSIN HCL 2 MG PO CAPS: 2.0000 mg | ORAL_CAPSULE | Freq: Every day | ORAL | 3 refills | Status: DC

## 2019-01-22 ENCOUNTER — Encounter: Payer: Self-pay | Admitting: Internal Medicine

## 2019-02-03 ENCOUNTER — Encounter: Payer: Self-pay | Admitting: Internal Medicine

## 2019-02-04 NOTE — Telephone Encounter (Signed)
[place] for this, done hardcopy to shirron to mail

## 2019-05-11 ENCOUNTER — Encounter: Payer: Self-pay | Admitting: Internal Medicine

## 2019-05-11 DIAGNOSIS — E559 Vitamin D deficiency, unspecified: Secondary | ICD-10-CM

## 2019-05-11 DIAGNOSIS — E611 Iron deficiency: Secondary | ICD-10-CM

## 2019-05-11 DIAGNOSIS — E538 Deficiency of other specified B group vitamins: Secondary | ICD-10-CM

## 2019-05-11 DIAGNOSIS — Z0000 Encounter for general adult medical examination without abnormal findings: Secondary | ICD-10-CM

## 2019-05-11 DIAGNOSIS — R739 Hyperglycemia, unspecified: Secondary | ICD-10-CM

## 2019-05-21 ENCOUNTER — Other Ambulatory Visit (INDEPENDENT_AMBULATORY_CARE_PROVIDER_SITE_OTHER): Payer: Medicare Other

## 2019-05-21 DIAGNOSIS — Z0000 Encounter for general adult medical examination without abnormal findings: Secondary | ICD-10-CM | POA: Diagnosis not present

## 2019-05-21 DIAGNOSIS — E538 Deficiency of other specified B group vitamins: Secondary | ICD-10-CM

## 2019-05-21 DIAGNOSIS — Z125 Encounter for screening for malignant neoplasm of prostate: Secondary | ICD-10-CM | POA: Diagnosis not present

## 2019-05-21 DIAGNOSIS — E611 Iron deficiency: Secondary | ICD-10-CM

## 2019-05-21 DIAGNOSIS — R739 Hyperglycemia, unspecified: Secondary | ICD-10-CM

## 2019-05-21 DIAGNOSIS — E559 Vitamin D deficiency, unspecified: Secondary | ICD-10-CM | POA: Diagnosis not present

## 2019-05-21 LAB — CBC WITH DIFFERENTIAL/PLATELET
Basophils Absolute: 0.1 10*3/uL (ref 0.0–0.1)
Basophils Relative: 0.9 % (ref 0.0–3.0)
Eosinophils Absolute: 0.2 10*3/uL (ref 0.0–0.7)
Eosinophils Relative: 2.9 % (ref 0.0–5.0)
HCT: 42.5 % (ref 39.0–52.0)
Hemoglobin: 14.1 g/dL (ref 13.0–17.0)
Lymphocytes Relative: 31.6 % (ref 12.0–46.0)
Lymphs Abs: 1.9 10*3/uL (ref 0.7–4.0)
MCHC: 33.2 g/dL (ref 30.0–36.0)
MCV: 91.9 fl (ref 78.0–100.0)
Monocytes Absolute: 0.8 10*3/uL (ref 0.1–1.0)
Monocytes Relative: 13.7 % — ABNORMAL HIGH (ref 3.0–12.0)
Neutro Abs: 3 10*3/uL (ref 1.4–7.7)
Neutrophils Relative %: 50.9 % (ref 43.0–77.0)
Platelets: 236 10*3/uL (ref 150.0–400.0)
RBC: 4.62 Mil/uL (ref 4.22–5.81)
RDW: 13.5 % (ref 11.5–15.5)
WBC: 5.9 10*3/uL (ref 4.0–10.5)

## 2019-05-21 LAB — URINALYSIS, ROUTINE W REFLEX MICROSCOPIC
Bilirubin Urine: NEGATIVE
Hgb urine dipstick: NEGATIVE
Ketones, ur: NEGATIVE
Leukocytes,Ua: NEGATIVE
Nitrite: NEGATIVE
RBC / HPF: NONE SEEN (ref 0–?)
Specific Gravity, Urine: 1.02 (ref 1.000–1.030)
Total Protein, Urine: NEGATIVE
Urine Glucose: NEGATIVE
Urobilinogen, UA: 0.2 (ref 0.0–1.0)
pH: 6 (ref 5.0–8.0)

## 2019-05-21 LAB — BASIC METABOLIC PANEL
BUN: 17 mg/dL (ref 6–23)
CO2: 28 mEq/L (ref 19–32)
Calcium: 10.1 mg/dL (ref 8.4–10.5)
Chloride: 105 mEq/L (ref 96–112)
Creatinine, Ser: 1.3 mg/dL (ref 0.40–1.50)
GFR: 53.52 mL/min — ABNORMAL LOW (ref >60.00)
Glucose, Bld: 130 mg/dL — ABNORMAL HIGH (ref 70–99)
Potassium: 4.6 mEq/L (ref 3.5–5.1)
Sodium: 142 mEq/L (ref 135–145)

## 2019-05-21 LAB — HEPATIC FUNCTION PANEL
ALT: 12 U/L (ref 0–53)
AST: 14 U/L (ref 0–37)
Albumin: 4.5 g/dL (ref 3.5–5.2)
Alkaline Phosphatase: 28 U/L — ABNORMAL LOW (ref 39–117)
Bilirubin, Direct: 0.1 mg/dL (ref 0.0–0.3)
Total Bilirubin: 0.5 mg/dL (ref 0.2–1.2)
Total Protein: 7.1 g/dL (ref 6.0–8.3)

## 2019-05-21 LAB — LIPID PANEL
Cholesterol: 188 mg/dL (ref 0–200)
HDL: 37.4 mg/dL — ABNORMAL LOW (ref >39.00)
NonHDL: 150.66
Total CHOL/HDL Ratio: 5
Triglycerides: 285 mg/dL — ABNORMAL HIGH (ref 0.0–149.0)
VLDL: 57 mg/dL — ABNORMAL HIGH (ref 0.0–40.0)

## 2019-05-21 LAB — IBC PANEL
Iron: 104 ug/dL (ref 42–165)
Saturation Ratios: 21 % (ref 20.0–50.0)
Transferrin: 353 mg/dL (ref 212.0–360.0)

## 2019-05-21 LAB — TSH: TSH: 0.91 u[IU]/mL (ref 0.35–4.50)

## 2019-05-21 LAB — VITAMIN B12: Vitamin B-12: 705 pg/mL (ref 211–911)

## 2019-05-21 LAB — LDL CHOLESTEROL, DIRECT: Direct LDL: 108 mg/dL

## 2019-05-21 LAB — PSA: PSA: 1.48 ng/mL (ref 0.10–4.00)

## 2019-05-21 LAB — VITAMIN D 25 HYDROXY (VIT D DEFICIENCY, FRACTURES): VITD: 30.18 ng/mL (ref 30.00–100.00)

## 2019-05-21 LAB — HEMOGLOBIN A1C: Hgb A1c MFr Bld: 6.3 % (ref 4.6–6.5)

## 2019-05-25 ENCOUNTER — Ambulatory Visit (INDEPENDENT_AMBULATORY_CARE_PROVIDER_SITE_OTHER): Payer: Medicare Other | Admitting: *Deleted

## 2019-05-25 ENCOUNTER — Encounter: Payer: Self-pay | Admitting: Internal Medicine

## 2019-05-25 ENCOUNTER — Ambulatory Visit (INDEPENDENT_AMBULATORY_CARE_PROVIDER_SITE_OTHER): Payer: Medicare Other | Admitting: Internal Medicine

## 2019-05-25 ENCOUNTER — Other Ambulatory Visit: Payer: Self-pay

## 2019-05-25 VITALS — BP 167/88 | HR 70 | Temp 98.7°F | Resp 17 | Ht 68.0 in | Wt 182.0 lb

## 2019-05-25 VITALS — BP 167/88 | HR 70 | Temp 98.7°F | Ht 68.0 in | Wt 182.0 lb

## 2019-05-25 DIAGNOSIS — F329 Major depressive disorder, single episode, unspecified: Secondary | ICD-10-CM | POA: Diagnosis not present

## 2019-05-25 DIAGNOSIS — I10 Essential (primary) hypertension: Secondary | ICD-10-CM

## 2019-05-25 DIAGNOSIS — F32A Depression, unspecified: Secondary | ICD-10-CM

## 2019-05-25 DIAGNOSIS — Z0001 Encounter for general adult medical examination with abnormal findings: Secondary | ICD-10-CM

## 2019-05-25 DIAGNOSIS — E785 Hyperlipidemia, unspecified: Secondary | ICD-10-CM

## 2019-05-25 DIAGNOSIS — R739 Hyperglycemia, unspecified: Secondary | ICD-10-CM

## 2019-05-25 DIAGNOSIS — R972 Elevated prostate specific antigen [PSA]: Secondary | ICD-10-CM

## 2019-05-25 DIAGNOSIS — Z0000 Encounter for general adult medical examination without abnormal findings: Secondary | ICD-10-CM

## 2019-05-25 MED ORDER — CITALOPRAM HYDROBROMIDE 10 MG PO TABS: 10.0000 mg | ORAL_TABLET | Freq: Every day | ORAL | 3 refills | Status: DC

## 2019-05-25 MED ORDER — AMLODIPINE BESYLATE 2.5 MG PO TABS: 2.5000 mg | ORAL_TABLET | Freq: Every day | ORAL | 3 refills | Status: DC

## 2019-05-25 NOTE — Progress Notes (Addendum)
Subjective:   Neil [name] is a 78 y.o. male who presents for an Initial Medicare Annual Wellness Visit.  Review of Systems   Cardiac Risk Factors include: advanced age (>38men, >66 women);male gender;hypertension;dyslipidemia;diabetes mellitus Sleep patterns: feels rested on waking, gets up 1-2 times nightly to void and sleeps 7 hours nightly.    Home Safety/Smoke Alarms: Feels safe in home. Smoke alarms in place.  Living environment; residence and Firearm Safety: 1-story house/ trailer. Lives alone, no needs for DME, good support system Seat Belt Safety/Bike Helmet: Wears seat belt.   PSA-  Lab Results  Component Value Date   PSA 1.48 05/21/2019   PSA 1.87 07/04/2018   PSA 3.13 01/03/2018      Objective:    Today's Vitals   05/25/19 1501  BP: (!) 167/88  Pulse: 70  Resp: 17  Temp: 98.7 F (37.1 C)  SpO2: 99%  Weight: 182 lb (82.6 kg)  Height: 5\' 8"  (1.727 m)  PainSc: 3    Body mass index is 27.67 kg/m.  Advanced Directives 05/25/2019 04/27/2016 02/15/2016 02/08/2016 05/23/2015 05/03/2015 03/01/2015  Does Patient Have a Medical Advance Directive? No No No Yes Yes Yes No  Type of Advance Directive - - Public librarian;Living will [place];Living will [place];Living will -  Does patient want to make changes to medical advance directive? - - - No - Patient declined No - Patient declined - -  Copy of [place] in Chart? - - - No - copy requested No - copy requested No - copy requested -  Would patient like information on creating a medical advance directive? Yes (ED - Information included in AVS) No - patient declined information - - - - -    Current Medications (verified) Outpatient Encounter Medications as of 05/25/2019  Medication Sig  . acetaminophen (TYLENOL) 500 MG tablet Take 1,500 mg by mouth 2 (two) times daily.   . Alpha-D-Galactosidase (BEANO PO) Take by mouth.  . benazepril (LOTENSIN)  40 MG tablet Take 1 tablet (40 mg total) by mouth daily.  . cholecalciferol (VITAMIN D) 1000 units tablet Take 1,000 Units by mouth daily.  . fenofibrate 160 MG tablet Take 1 tablet (160 mg total) by mouth at bedtime.  . sodium chloride (OCEAN) 0.65 % nasal spray Place 1 spray into the nose daily as needed for congestion.   [name] terazosin (HYTRIN) 2 MG capsule Take 1 capsule (2 mg total) by mouth daily.   No facility-administered encounter medications on file as of 05/25/2019.     Allergies (verified) Statins and Zetia [ezetimibe]   History: Past Medical History:  Diagnosis Date  . ALLERGIC RHINITIS 12/04/2007  . BENIGN PROSTATIC HYPERTROPHY 06/05/2007  . BUNDLE BRANCH BLOCK, RIGHT 07/21/2009  . BURSITIS, RIGHT SHOULDER 11/06/2010  . Choledocholithiasis   . CKD (chronic kidney disease) stage 3, GFR 30-59 ml/min (HCC) 08/02/2017  . COLONIC POLYPS, HX OF 06/05/2007   tubular adenoma also 02/19/2013  . Coronary artery calcification seen on CT scan 03/07/2016  . DIABETES MELLITUS, TYPE [name] 12/04/2007   pt states not diabetic-no meds 02-05-13 PV  . DIVERTICULOSIS, COLON 07/21/2009  . ERECTILE DYSFUNCTION 06/05/2007  . Gallstones   . HYPERLIPIDEMIA 06/05/2007  . HYPERTENSION 06/05/2007  . Hypotension   . INGUINAL HERNIA, RIGHT, SMALL 11/06/2010  . Liver abscess 05/23/2015  . SKIN LESION 06/03/2008   Past Surgical History:  Procedure Laterality Date  . CHOLECYSTECTOMY N/A 03/02/2015   Procedure:  LAPAROSCOPIC CHOLECYSTECTOMY;  Surgeon: Fanny Skates, MD;  Location: [facility];  Service: General;  Laterality: N/A;  . COLONOSCOPY    . ERCP N/A 03/01/2015   Procedure: ENDOSCOPIC RETROGRADE CHOLANGIOPANCREATOGRAPHY (ERCP);  Surgeon: Irene Shipper, MD;  Location: [facility];  Service: Endoscopy;  Laterality: N/A;  Dr Dalbert Batman wants patient to stay overnight after ERCP  . ERCP N/A 05/03/2015   Procedure: ENDOSCOPIC RETROGRADE CHOLANGIOPANCREATOGRAPHY (ERCP);  Surgeon: Irene Shipper, MD;  Location: [facility];   Service: Endoscopy;  Laterality: N/A;  . FINGER SURGERY Left   . HEMORRHOID SURGERY    . INGUINAL HERNIA REPAIR Right    8'14 repair  . POLYPECTOMY    . TONSILLECTOMY     Family History  Problem Relation Age of Onset  . Colon cancer Mother 62  . Brain cancer Mother 40       mets from colon cancer  . Rectal cancer Neg Hx   . Stomach cancer Neg Hx    Social History   Socioeconomic History  . Marital status: Widowed    Spouse name: Not on file  . Number of children: Not on file  . Years of education: Not on file  . Highest education level: Not on file  Occupational History  . Occupation: retired  Scientific laboratory technician  . Financial resource strain: Not hard at all  . Food insecurity    Worry: Never true    Inability: Never true  . Transportation needs    Medical: No    Non-medical: No  Tobacco Use  . Smoking status: Former Smoker    Quit date: 10/15/2005    Years since quitting: 13.6  . Smokeless tobacco: Never Used  Substance and Sexual Activity  . Alcohol use: No    Alcohol/week: 0.0 standard drinks  . Drug use: No  . Sexual activity: Not Currently  Lifestyle  . Physical activity    Days per week: 0 days    Minutes per session: 0 min  . Stress: To some extent  Relationships  . Social connections    Talks on phone: More than three times a week    Gets together: More than three times a week    Attends religious service: More than 4 times per year    Active member of club or organization: Yes    Attends meetings of clubs or organizations: More than 4 times per year    Relationship status: Widowed  Other Topics Concern  . Not on file  Social History Narrative  . Not on file   Tobacco Counseling Counseling given: Not Answered  Activities of Daily Living In your present state of health, do you have any difficulty performing the following activities: 05/25/2019  Hearing? N  Vision? N  Difficulty concentrating or making decisions? N  Walking or climbing stairs? N   Dressing or bathing? N  Doing errands, shopping? N  Preparing Food and eating ? N  Using the Toilet? N  In the past six months, have you accidently leaked urine? N  Do you have problems with loss of bowel control? N  Managing your Medications? N  Managing your Finances? N  Housekeeping or managing your Housekeeping? N  Some recent data might be hidden     Immunizations and Health Maintenance Immunization History  Administered Date(s) Administered  . Influenza Split 07/04/2012  . Influenza Whole 07/21/2009, 11/06/2010  . Influenza, High Dose Seasonal PF 07/03/2016, 08/02/2017, 07/10/2018  . Influenza,inj,Quad PF,6+ Mos 06/24/2013, 06/25/2014, 07/12/2015  . Pneumococcal  Conjugate-13 03/17/2014  . Pneumococcal Polysaccharide-23 11/14/2011  . Td 07/21/2009   Health Maintenance Due  Topic Date Due  . FOOT EXAM  08/02/2018  . OPHTHALMOLOGY EXAM  12/17/2018  . INFLUENZA VACCINE  05/16/2019    Patient Care Team: Biagio Borg, MD as PCP - Gwenevere Abbot, Docia Chuck, MD as Consulting Physician (Gastroenterology)  Indicate any recent Medical Services you may have received from other than [facility] providers in the past year (date may be approximate).    Assessment:   This is a routine wellness examination for K[place]. Physical assessment deferred to PCP.   Hearing/Vision screen  Hearing Screening   125Hz  250Hz  500Hz  1000Hz  2000Hz  3000Hz  4000Hz  6000Hz  8000Hz   Right ear:           Left ear:           Comments: Able to hear conversational tones w/o difficulty. No issues reported.    Vision Screening Comments: appointment yearly Dr. Marin Comment  Dietary issues and exercise activities discussed: Current Exercise Habits: The patient does not participate in regular exercise at present, Exercise limited by: orthopedic condition(s) Diet (meal preparation, eat out, water intake, caffeinated beverages, dairy products, fruits and vegetables): in general, a "healthy" diet  , well balanced   Reviewed  heart healthy and diabetic diet. Encouraged patient to increase daily water and healthy fluid intake.  Goals    . Patient Stated     Maintain my current health status.       Depression Screen PHQ 2/9 Scores 05/25/2019 07/10/2018 01/06/2018 08/02/2017  PHQ - 2 Score 3 0 0 0  PHQ- 9 Score 3 - - -    Fall Risk Fall Risk  05/25/2019 07/10/2018 01/06/2018 08/02/2017 01/01/2017  Falls in the past year? 0 No No No No  Number falls in past yr: 0 - - - -  Injury with Fall? 0 - - - -  Risk for fall due to : Impaired balance/gait - - - -   Cognitive Function:       Ad8 score reviewed for issues:  Issues making decisions: no  Less interest in hobbies / activities: no  Repeats questions, stories (family complaining): no  Trouble using ordinary gadgets (microwave, computer, phone):no  Forgets the month or year: no  Mismanaging finances: no  Remembering appts: no  Daily problems with thinking and/or memory: no Ad8 score is= 0 Screening Tests Health Maintenance  Topic Date Due  . FOOT EXAM  08/02/2018  . OPHTHALMOLOGY EXAM  12/17/2018  . INFLUENZA VACCINE  05/16/2019  . TETANUS/TDAP  07/22/2019  . HEMOGLOBIN A1C  11/21/2019  . COLONOSCOPY  02/28/2021  . PNA vac Low Risk Adult  Completed      Plan:    Reviewed health maintenance screenings with patient today and relevant education, vaccines, and/or referrals were provided.   Continue to eat heart healthy diet (full of fruits, vegetables, whole grains, lean protein, water--limit salt, fat, and sugar intake) and increase physical activity as tolerated.  Continue doing brain stimulating activities (puzzles, reading, adult coloring books, staying active) to keep memory sharp.   I have personally reviewed and noted the following in the patient's chart:   . Medical and social history . Use of alcohol, tobacco or illicit drugs  . Current medications and supplements . Functional ability and status . Nutritional status . Physical  activity . Advanced directives . List of other physicians . Vitals . Screenings to include cognitive, depression, and falls . Referrals and appointments  In  addition, I have reviewed and discussed with patient certain preventive protocols, quality metrics, and best practice recommendations. A written personalized care plan for preventive services as well as general preventive health recommendations were provided to patient.     Michiel Cowboy, RN   05/25/2019      Medical screening examination/treatment/procedure(s) were performed by non-physician practitioner and as supervising physician I was immediately available for consultation/collaboration. I agree with above. [name], MD

## 2019-05-25 NOTE — Patient Instructions (Addendum)
Continue doing brain stimulating activities (puzzles, reading, adult coloring books, staying active) to keep memory sharp.   Continue to eat heart healthy diet (full of fruits, vegetables, whole grains, lean protein, water--limit salt, fat, and sugar intake) and increase physical activity as tolerated.   Mr. Neil [name] , Thank you for taking time to come for your Medicare Wellness Visit. I appreciate your ongoing commitment to your health goals. Please review the following plan we discussed and let me know if I can assist you in the future.   These are the goals we discussed: Goals    . Patient Stated     Maintain my current health status.        This is a list of the screening recommended for you and due dates:  Health Maintenance  Topic Date Due  . Complete foot exam   08/02/2018  . Eye exam for diabetics  12/17/2018  . Flu Shot  05/16/2019  . Tetanus Vaccine  07/22/2019  . Hemoglobin A1C  11/21/2019  . Colon Cancer Screening  02/28/2021  . Pneumonia vaccines  Completed    . Preventive Care 84 Years and Older, Male Preventive care refers to lifestyle choices and visits with your health care provider that can promote health and wellness. This includes:  A yearly physical exam. This is also called an annual well check.  Regular dental and eye exams.  Immunizations.  Screening for certain conditions.  Healthy lifestyle choices, such as diet and exercise. What can I expect for my preventive care visit? Physical exam Your health care provider will check:  Height and weight. These may be used to calculate body mass index (BMI), which is a measurement that tells if you are at a healthy weight.  Heart rate and blood pressure.  Your skin for abnormal spots. Counseling Your health care provider may ask you questions about:  Alcohol, tobacco, and drug use.  Emotional well-being.  Home and relationship well-being.  Sexual activity.  Eating habits.  History of falls.   Memory and ability to understand (cognition).  Work and work Statistician. What immunizations do I need?  Influenza (flu) vaccine  This is recommended every year. Tetanus, diphtheria, and pertussis (Tdap) vaccine  You may need a Td booster every 10 years. Varicella (chickenpox) vaccine  You may need this vaccine if you have not already been vaccinated. Zoster (shingles) vaccine  You may need this after age 57. Pneumococcal conjugate (PCV13) vaccine  One dose is recommended after age 47. Pneumococcal polysaccharide (PPSV23) vaccine  One dose is recommended after age 13. Measles, mumps, and rubella (MMR) vaccine  You may need at least one dose of MMR if you were born in 1957 or later. You may also need a second dose. Meningococcal conjugate (MenACWY) vaccine  You may need this if you have certain conditions. Hepatitis A vaccine  You may need this if you have certain conditions or if you travel or work in places where you may be exposed to hepatitis A. Hepatitis B vaccine  You may need this if you have certain conditions or if you travel or work in places where you may be exposed to hepatitis B. Haemophilus influenzae type b (Hib) vaccine  You may need this if you have certain conditions. You may receive vaccines as individual doses or as more than one vaccine together in one shot (combination vaccines). Talk with your health care provider about the risks and benefits of combination vaccines. What tests do I need? Blood tests  Lipid  and cholesterol levels. These may be checked every 5 years, or more frequently depending on your overall health.  Hepatitis C test.  Hepatitis B test. Screening  Lung cancer screening. You may have this screening every year starting at age 75 if you have a 30-pack-year history of smoking and currently smoke or have quit within the past 15 years.  Colorectal cancer screening. All adults should have this screening starting at age 46 and  continuing until age 56. Your health care provider may recommend screening at age 29 if you are at increased risk. You will have tests every 1-10 years, depending on your results and the type of screening test.  Prostate cancer screening. Recommendations will vary depending on your family history and other risks.  Diabetes screening. This is done by checking your blood sugar (glucose) after you have not eaten for a while (fasting). You may have this done every 1-3 years.  Abdominal aortic aneurysm (AAA) screening. You may need this if you are a current or former smoker.  Sexually transmitted disease (STD) testing. Follow these instructions at home: Eating and drinking  Eat a diet that includes fresh fruits and vegetables, whole grains, lean protein, and low-fat dairy products. Limit your intake of foods with high amounts of sugar, saturated fats, and salt.  Take vitamin and mineral supplements as recommended by your health care provider.  Do not drink alcohol if your health care provider tells you not to drink.  If you drink alcohol: ? Limit how much you have to 0-2 drinks a day. ? Be aware of how much alcohol is in your drink. In the [place] one drink equals one 12 oz bottle of beer (355 mL), one 5 oz glass of Adewale Pucillo (148 mL), or one 1 oz glass of hard liquor (44 mL). Lifestyle  Take daily care of your teeth and gums.  Stay active. Exercise for at least 30 minutes on 5 or more days each week.  Do not use any products that contain nicotine or tobacco, such as cigarettes, e-cigarettes, and chewing tobacco. If you need help quitting, ask your health care provider.  If you are sexually active, practice safe sex. Use a condom or other form of protection to prevent STIs (sexually transmitted infections).  Talk with your health care provider about taking a low-dose aspirin or statin. What's next?  Visit your health care provider once a year for a well check visit.  Ask your health care  provider how often you should have your eyes and teeth checked.  Stay up to date on all vaccines. This information is not intended to replace advice given to you by your health care provider. Make sure you discuss any questions you have with your health care provider. Document Released: 10/28/2015 Document Revised: 09/25/2018 Document Reviewed: 09/25/2018 Elsevier Patient Education  2020 [employer].

## 2019-05-25 NOTE — Patient Instructions (Signed)
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  Please have the pharmacy call with any other refills you may need.  Please continue your efforts at being more active, low cholesterol diet, and weight control.  You are otherwise up to date with prevention measures today.  Please keep your appointments with your specialists as you may have planned  Please return in 6 months, or sooner if needed

## 2019-05-25 NOTE — Progress Notes (Signed)
Subjective:    Patient ID: Neil [name], male    DOB: Nov 25, 1940, 78 y.o.   MRN: [id number]  HPI  Here for wellness and f/u;  Overall doing ok;  Pt denies Chest pain, worsening SOB, DOE, wheezing, orthopnea, PND, worsening LE edema, palpitations, dizziness or syncope.  Pt denies neurological change such as new headache, facial or extremity weakness.  Pt denies polydipsia, polyuria, or low sugar symptoms. Pt states overall good compliance with treatment and medications, good tolerability, and has been trying to follow appropriate diet.  Pt denies worsening depressive symptoms, suicidal ideation or panic. No fever, night sweats, wt loss, loss of appetite, or other constitutional symptoms.  Pt states good ability with ADL's, has low fall risk, home safety reviewed and adequate, no other significant changes in hearing or vision, and only occasionally active with exercise.  Wife passed in April, sister in January, nephew in 40;s passed in July, then niece passed 3 days ago..  Lots of stress and a few lbs increased.   Wt Readings from Last 3 Encounters:  05/25/19 182 lb (82.6 kg)  05/25/19 182 lb (82.6 kg)  07/10/18 173 lb (78.5 kg)   BP Readings from Last 3 Encounters:  05/25/19 (!) 167/88  05/25/19 (!) 167/88  07/10/18 116/74  Also with incresaed depression, Denies suicidal ideation, or panic; has ongoing anxiety. Having counseling but only works so much.   BP has been mild elevated at home such as today  Denies urinary symptoms such as dysuria, frequency, urgency, flank pain, hematuria or n/v, fever, chills. Past Medical History:  Diagnosis Date  . ALLERGIC RHINITIS 12/04/2007  . BENIGN PROSTATIC HYPERTROPHY 06/05/2007  . BUNDLE BRANCH BLOCK, RIGHT 07/21/2009  . BURSITIS, RIGHT SHOULDER 11/06/2010  . Choledocholithiasis   . CKD (chronic kidney disease) stage 3, GFR 30-59 ml/min (HCC) 08/02/2017  . COLONIC POLYPS, HX OF 06/05/2007   tubular adenoma also 02/19/2013  . Coronary artery  calcification seen on CT scan 03/07/2016  . DIABETES MELLITUS, TYPE [name] 12/04/2007   pt states not diabetic-no meds 02-05-13 PV  . DIVERTICULOSIS, COLON 07/21/2009  . ERECTILE DYSFUNCTION 06/05/2007  . Gallstones   . HYPERLIPIDEMIA 06/05/2007  . HYPERTENSION 06/05/2007  . Hypotension   . INGUINAL HERNIA, RIGHT, SMALL 11/06/2010  . Liver abscess 05/23/2015  . SKIN LESION 06/03/2008   Past Surgical History:  Procedure Laterality Date  . CHOLECYSTECTOMY N/A 03/02/2015   Procedure: LAPAROSCOPIC CHOLECYSTECTOMY;  Surgeon: Fanny Skates, MD;  Location: [facility];  Service: General;  Laterality: N/A;  . COLONOSCOPY    . ERCP N/A 03/01/2015   Procedure: ENDOSCOPIC RETROGRADE CHOLANGIOPANCREATOGRAPHY (ERCP);  Surgeon: Irene Shipper, MD;  Location: [facility];  Service: Endoscopy;  Laterality: N/A;  Dr Dalbert Batman wants patient to stay overnight after ERCP  . ERCP N/A 05/03/2015   Procedure: ENDOSCOPIC RETROGRADE CHOLANGIOPANCREATOGRAPHY (ERCP);  Surgeon: Irene Shipper, MD;  Location: [facility];  Service: Endoscopy;  Laterality: N/A;  . FINGER SURGERY Left   . HEMORRHOID SURGERY    . INGUINAL HERNIA REPAIR Right    8'14 repair  . POLYPECTOMY    . TONSILLECTOMY      reports that he quit smoking about 13 years ago. He has never used smokeless tobacco. He reports that he does not drink alcohol or use drugs. family history includes Brain cancer (age of onset: 41) in his mother; Colon cancer (age of onset: 4) in his mother. Allergies  Allergen Reactions  . Statins     REACTION: muscle  aches  . Zetia [Ezetimibe] Other (See Comments)    Statins= Muscle and bone pain   Current Outpatient Medications on File Prior to Visit  Medication Sig Dispense Refill  . acetaminophen (TYLENOL) 500 MG tablet Take 1,500 mg by mouth 2 (two) times daily.     . Alpha-D-Galactosidase (BEANO PO) Take by mouth.    . benazepril (LOTENSIN) 40 MG tablet Take 1 tablet (40 mg total) by mouth daily. 90 tablet 3  . cholecalciferol  (VITAMIN D) 1000 units tablet Take 1,000 Units by mouth daily.    . fenofibrate 160 MG tablet Take 1 tablet (160 mg total) by mouth at bedtime. 90 tablet 3  . sodium chloride (OCEAN) 0.65 % nasal spray Place 1 spray into the nose daily as needed for congestion.     [name] terazosin (HYTRIN) 2 MG capsule Take 1 capsule (2 mg total) by mouth daily. 90 capsule 3   No current facility-administered medications on file prior to visit.    Review of Systems Constitutional: Negative for other unusual diaphoresis, sweats, appetite or weight changes HENT: Negative for other worsening hearing loss, ear pain, facial swelling, mouth sores or neck stiffness.   Eyes: Negative for other worsening pain, redness or other visual disturbance.  Respiratory: Negative for other stridor or swelling Cardiovascular: Negative for other palpitations or other chest pain  Gastrointestinal: Negative for worsening diarrhea or loose stools, blood in stool, distention or other pain Genitourinary: Negative for hematuria, flank pain or other change in urine volume.  Musculoskeletal: Negative for myalgias or other joint swelling.  Skin: Negative for other color change, or other wound or worsening drainage.  Neurological: Negative for other syncope or numbness. Hematological: Negative for other adenopathy or swelling Psychiatric/Behavioral: Negative for hallucinations, other worsening agitation, SI, self-injury, or new decreased concentration All other system neg per pt    Objective:   Physical Exam BP (!) 167/88   Pulse 70   Temp 98.7 F (37.1 C) (Oral)   Ht 5\' 8"  (1.727 m)   Wt 182 lb (82.6 kg)   SpO2 99%   BMI 27.67 kg/m  VS noted,  Constitutional: Pt is oriented to person, place, and time. Appears well-developed and well-nourished, in no significant distress and comfortable Head: Normocephalic and atraumatic  Eyes: Conjunctivae and EOM are normal. Pupils are equal, round, and reactive to light Right Ear: External ear  normal without discharge Left Ear: External ear normal without discharge Nose: Nose without discharge or deformity Mouth/Throat: Oropharynx is without other ulcerations and moist  Neck: Normal range of motion. Neck supple. No JVD present. No tracheal deviation present or significant neck LA or mass Cardiovascular: Normal rate, regular rhythm, normal heart sounds and intact distal pulses.   Pulmonary/Chest: WOB normal and breath sounds without rales or wheezing  Abdominal: Soft. Bowel sounds are normal. NT. No HSM  Musculoskeletal: Normal range of motion. Exhibits no edema Lymphadenopathy: Has no other cervical adenopathy.  Neurological: Pt is alert and oriented to person, place, and time. Pt has normal reflexes. No cranial nerve deficit. Motor grossly intact, Gait intact Skin: Skin is warm and dry. No rash noted or new ulcerations Psychiatric:  Has nervous depresseed grieving/sad mood and affect. Behavior is normal without agitation No other exam findings Lab Results  Component Value Date   WBC 5.9 05/21/2019   HGB 14.1 05/21/2019   HCT 42.5 05/21/2019   PLT 236.0 05/21/2019   GLUCOSE 130 (H) 05/21/2019   CHOL 188 05/21/2019   TRIG 285.0 (H) 05/21/2019  HDL 37.40 (L) 05/21/2019   LDLDIRECT 108.0 05/21/2019   LDLCALC 92 07/04/2018   ALT 12 05/21/2019   AST 14 05/21/2019   NA 142 05/21/2019   K 4.6 05/21/2019   CL 105 05/21/2019   CREATININE 1.30 05/21/2019   BUN 17 05/21/2019   CO2 28 05/21/2019   TSH 0.91 05/21/2019   PSA 1.48 05/21/2019   INR 1.25 05/23/2015   HGBA1C 6.3 05/21/2019   MICROALBUR 5.6 (H) 07/08/2015      Assessment & Plan:

## 2019-05-28 ENCOUNTER — Encounter: Payer: Self-pay | Admitting: Internal Medicine

## 2019-05-28 DIAGNOSIS — F32A Depression, unspecified: Secondary | ICD-10-CM | POA: Insufficient documentation

## 2019-05-28 DIAGNOSIS — F329 Major depressive disorder, single episode, unspecified: Secondary | ICD-10-CM | POA: Insufficient documentation

## 2019-05-28 NOTE — Assessment & Plan Note (Signed)
Asympt, for f/u psa 

## 2019-05-28 NOTE — Assessment & Plan Note (Signed)
stable overall by history and exam, recent data reviewed with pt, and pt to continue medical treatment as before,  to f/u any worsening symptoms or concerns  

## 2019-05-28 NOTE — Assessment & Plan Note (Signed)
Mild uncontrolled, add amlodipine 2,5 qd

## 2019-05-28 NOTE — Assessment & Plan Note (Addendum)
Mild to mod, for celexa 10 qd,  to f/u any worsening symptoms or concerns, continue counseling  In addition to the time spent performing CPE, I spent an additional 25 minutes face to face,in which greater than 50% of this time was spent in counseling and coordination of care for patient's acute illness as documented, including the differential dx, treatment, further evaluation and other management of depression, HTn, hyperglycemia, HLD and increased psa velocity

## 2019-06-01 ENCOUNTER — Encounter: Payer: Self-pay | Admitting: Internal Medicine

## 2019-06-04 NOTE — Addendum Note (Signed)
Addended by: Emelia Loron A on: 06/04/2019 04:26 PM   Modules accepted: Orders

## 2019-06-05 ENCOUNTER — Ambulatory Visit (INDEPENDENT_AMBULATORY_CARE_PROVIDER_SITE_OTHER): Payer: Medicare Other

## 2019-06-05 DIAGNOSIS — Z23 Encounter for immunization: Secondary | ICD-10-CM

## 2019-06-09 ENCOUNTER — Encounter: Payer: Self-pay | Admitting: Internal Medicine

## 2019-09-18 ENCOUNTER — Encounter: Payer: Self-pay | Admitting: Internal Medicine

## 2019-10-05 ENCOUNTER — Other Ambulatory Visit: Payer: Self-pay | Admitting: Internal Medicine

## 2019-10-15 ENCOUNTER — Encounter: Payer: Self-pay | Admitting: Internal Medicine

## 2019-10-18 ENCOUNTER — Encounter: Payer: Self-pay | Admitting: Internal Medicine

## 2019-10-19 MED ORDER — TIZANIDINE HCL 2 MG PO CAPS: 2.0000 mg | ORAL_CAPSULE | Freq: Three times a day (TID) | ORAL | 1 refills | Status: DC

## 2019-10-22 ENCOUNTER — Telehealth: Payer: Self-pay

## 2019-10-22 MED ORDER — CYCLOBENZAPRINE HCL 5 MG PO TABS: 5.0000 mg | ORAL_TABLET | Freq: Three times a day (TID) | ORAL | 1 refills | Status: DC | PRN

## 2019-10-22 NOTE — Telephone Encounter (Signed)
Copied from [place] [id number]. Topic: General - Inquiry >> Oct 22, 2019 10:55 AM Greggory Keen D wrote: Reason for CRM: pt called saying he has been taking the Zanaflex muscle relaxor and it is making him very dizzy and sleepy.  He says he cant take this medicaiton for back pain and he wants to know if there is some thing he can take that wont make him feel this way.  CB#  [phone]

## 2019-10-22 NOTE — Telephone Encounter (Signed)
Ok to try change to flexeril  There is another called robaxin if flexeril not tolerated as well

## 2019-10-23 NOTE — Telephone Encounter (Signed)
Pt contacted and informed of same.  

## 2019-10-27 ENCOUNTER — Ambulatory Visit: Payer: Medicare Other | Attending: Internal Medicine

## 2019-10-27 ENCOUNTER — Encounter: Payer: Self-pay | Admitting: Internal Medicine

## 2019-10-27 DIAGNOSIS — Z23 Encounter for immunization: Secondary | ICD-10-CM | POA: Insufficient documentation

## 2019-10-27 NOTE — Progress Notes (Signed)
   Covid-19 Vaccination Clinic  Name:  Neil [name]    MRN: [id number] DOB: 09/21/1941  10/27/2019  Neil [name] was observed post Covid-19 immunization for 30 minutes based on pre-vaccination screening without incidence. He was provided with Vaccine Information Sheet and instruction to access the V-Safe system.   Neil [name] was instructed to call 911 with any severe reactions post vaccine: [name] Difficulty breathing  . Swelling of your face and throat  . A fast heartbeat  . A bad rash all over your body  . Dizziness and weakness    Immunizations Administered    Name Date Dose VIS Date Route   Pfizer COVID-19 Vaccine 10/27/2019 12:11 PM 0.3 mL 09/25/2019 Intramuscular   Manufacturer: [employer]   Lot: [id number]   [place]: [id number]

## 2019-11-16 ENCOUNTER — Ambulatory Visit: Payer: Medicare Other

## 2019-11-16 ENCOUNTER — Ambulatory Visit: Payer: Medicare Other | Attending: Internal Medicine

## 2019-11-16 DIAGNOSIS — Z23 Encounter for immunization: Secondary | ICD-10-CM

## 2019-11-16 NOTE — Progress Notes (Signed)
   Covid-19 Vaccination Clinic  Name:  Neil [name]    MRN: [id number] DOB: [birth year]  11/16/2019  [name] was observed post Covid-19 immunization for 15 minutes without incidence. He was provided with Vaccine Information Sheet and instruction to access the V-Safe system.   [name] was instructed to call 911 with any severe reactions post vaccine: [name] Difficulty breathing  . Swelling of your face and throat  . A fast heartbeat  . A bad rash all over your body  . Dizziness and weakness    Immunizations Administered    Name Date Dose VIS Date Route   Pfizer COVID-19 Vaccine 11/16/2019 10:46 AM 0.3 mL 09/25/2019 Intramuscular   Manufacturer: [place]   Lot: [id number]   [place]: [id number]

## 2019-11-19 ENCOUNTER — Encounter: Payer: Self-pay | Admitting: Internal Medicine

## 2019-11-19 DIAGNOSIS — E538 Deficiency of other specified B group vitamins: Secondary | ICD-10-CM

## 2019-11-19 DIAGNOSIS — E559 Vitamin D deficiency, unspecified: Secondary | ICD-10-CM

## 2019-11-19 DIAGNOSIS — E611 Iron deficiency: Secondary | ICD-10-CM

## 2019-11-19 DIAGNOSIS — Z0000 Encounter for general adult medical examination without abnormal findings: Secondary | ICD-10-CM

## 2019-11-19 DIAGNOSIS — R739 Hyperglycemia, unspecified: Secondary | ICD-10-CM

## 2019-11-20 ENCOUNTER — Other Ambulatory Visit (INDEPENDENT_AMBULATORY_CARE_PROVIDER_SITE_OTHER): Payer: Medicare Other

## 2019-11-20 DIAGNOSIS — E611 Iron deficiency: Secondary | ICD-10-CM

## 2019-11-20 DIAGNOSIS — E559 Vitamin D deficiency, unspecified: Secondary | ICD-10-CM

## 2019-11-20 DIAGNOSIS — R739 Hyperglycemia, unspecified: Secondary | ICD-10-CM | POA: Diagnosis not present

## 2019-11-20 DIAGNOSIS — Z0000 Encounter for general adult medical examination without abnormal findings: Secondary | ICD-10-CM

## 2019-11-20 DIAGNOSIS — E538 Deficiency of other specified B group vitamins: Secondary | ICD-10-CM

## 2019-11-20 LAB — URINALYSIS, ROUTINE W REFLEX MICROSCOPIC
Bilirubin Urine: NEGATIVE
Hgb urine dipstick: NEGATIVE
Ketones, ur: NEGATIVE
Leukocytes,Ua: NEGATIVE
Nitrite: NEGATIVE
Specific Gravity, Urine: >=1.03 — AB (ref 1.000–1.030)
Total Protein, Urine: 30 — AB
Urine Glucose: 100 — AB
Urobilinogen, UA: 0.2 (ref 0.0–1.0)
pH: 6 (ref 5.0–8.0)

## 2019-11-20 LAB — CBC WITH DIFFERENTIAL/PLATELET
Basophils Absolute: 0.1 10*3/uL (ref 0.0–0.1)
Basophils Relative: 1.3 % (ref 0.0–3.0)
Eosinophils Absolute: 0.2 10*3/uL (ref 0.0–0.7)
Eosinophils Relative: 2.6 % (ref 0.0–5.0)
HCT: 44.4 % (ref 39.0–52.0)
Hemoglobin: 14.6 g/dL (ref 13.0–17.0)
Lymphocytes Relative: 26.4 % (ref 12.0–46.0)
Lymphs Abs: 1.7 10*3/uL (ref 0.7–4.0)
MCHC: 33 g/dL (ref 30.0–36.0)
MCV: 90.3 fl (ref 78.0–100.0)
Monocytes Absolute: 0.8 10*3/uL (ref 0.1–1.0)
Monocytes Relative: 12.3 % — ABNORMAL HIGH (ref 3.0–12.0)
Neutro Abs: 3.7 10*3/uL (ref 1.4–7.7)
Neutrophils Relative %: 57.4 % (ref 43.0–77.0)
Platelets: 234 10*3/uL (ref 150.0–400.0)
RBC: 4.91 Mil/uL (ref 4.22–5.81)
RDW: 14.2 % (ref 11.5–15.5)
WBC: 6.5 10*3/uL (ref 4.0–10.5)

## 2019-11-20 LAB — IBC PANEL
Iron: 73 ug/dL (ref 42–165)
Saturation Ratios: 15.6 % — ABNORMAL LOW (ref 20.0–50.0)
Transferrin: 335 mg/dL (ref 212.0–360.0)

## 2019-11-20 LAB — HEMOGLOBIN A1C: Hgb A1c MFr Bld: 6.8 % — ABNORMAL HIGH (ref 4.6–6.5)

## 2019-11-20 LAB — HEPATIC FUNCTION PANEL
ALT: 20 U/L (ref 0–53)
AST: 16 U/L (ref 0–37)
Albumin: 4.4 g/dL (ref 3.5–5.2)
Alkaline Phosphatase: 35 U/L — ABNORMAL LOW (ref 39–117)
Bilirubin, Direct: 0.1 mg/dL (ref 0.0–0.3)
Total Bilirubin: 0.5 mg/dL (ref 0.2–1.2)
Total Protein: 7.2 g/dL (ref 6.0–8.3)

## 2019-11-20 LAB — BASIC METABOLIC PANEL
BUN: 19 mg/dL (ref 6–23)
CO2: 30 mEq/L (ref 19–32)
Calcium: 9.8 mg/dL (ref 8.4–10.5)
Chloride: 103 mEq/L (ref 96–112)
Creatinine, Ser: 1.41 mg/dL (ref 0.40–1.50)
GFR: 48.67 mL/min — ABNORMAL LOW (ref >60.00)
Glucose, Bld: 143 mg/dL — ABNORMAL HIGH (ref 70–99)
Potassium: 4.6 mEq/L (ref 3.5–5.1)
Sodium: 142 mEq/L (ref 135–145)

## 2019-11-20 LAB — LIPID PANEL
Cholesterol: 191 mg/dL (ref 0–200)
HDL: 35 mg/dL — ABNORMAL LOW (ref >39.00)
NonHDL: 156.26
Total CHOL/HDL Ratio: 5
Triglycerides: 227 mg/dL — ABNORMAL HIGH (ref 0.0–149.0)
VLDL: 45.4 mg/dL — ABNORMAL HIGH (ref 0.0–40.0)

## 2019-11-20 LAB — TSH: TSH: 0.67 u[IU]/mL (ref 0.35–4.50)

## 2019-11-20 LAB — LDL CHOLESTEROL, DIRECT: Direct LDL: 109 mg/dL

## 2019-11-20 LAB — VITAMIN B12: Vitamin B-12: 1098 pg/mL — ABNORMAL HIGH (ref 211–911)

## 2019-11-20 LAB — PSA: PSA: 1.9 ng/mL (ref 0.10–4.00)

## 2019-11-20 LAB — VITAMIN D 25 HYDROXY (VIT D DEFICIENCY, FRACTURES): VITD: 29.66 ng/mL — ABNORMAL LOW (ref 30.00–100.00)

## 2019-11-25 ENCOUNTER — Ambulatory Visit (INDEPENDENT_AMBULATORY_CARE_PROVIDER_SITE_OTHER): Payer: Medicare Other | Admitting: Internal Medicine

## 2019-11-25 ENCOUNTER — Other Ambulatory Visit: Payer: Self-pay

## 2019-11-25 ENCOUNTER — Encounter: Payer: Self-pay | Admitting: Internal Medicine

## 2019-11-25 VITALS — BP 132/78 | HR 86 | Temp 98.0°F | Ht 68.0 in | Wt 186.0 lb

## 2019-11-25 DIAGNOSIS — R739 Hyperglycemia, unspecified: Secondary | ICD-10-CM | POA: Diagnosis not present

## 2019-11-25 DIAGNOSIS — I10 Essential (primary) hypertension: Secondary | ICD-10-CM

## 2019-11-25 DIAGNOSIS — E785 Hyperlipidemia, unspecified: Secondary | ICD-10-CM

## 2019-11-25 DIAGNOSIS — R251 Tremor, unspecified: Secondary | ICD-10-CM

## 2019-11-25 DIAGNOSIS — Z0000 Encounter for general adult medical examination without abnormal findings: Secondary | ICD-10-CM

## 2019-11-25 DIAGNOSIS — Z0001 Encounter for general adult medical examination with abnormal findings: Secondary | ICD-10-CM

## 2019-11-25 DIAGNOSIS — E559 Vitamin D deficiency, unspecified: Secondary | ICD-10-CM

## 2019-11-25 DIAGNOSIS — E119 Type 2 diabetes mellitus without complications: Secondary | ICD-10-CM

## 2019-11-25 MED ORDER — METOPROLOL SUCCINATE ER 25 MG PO TB24: 25.0000 mg | ORAL_TABLET | Freq: Every day | ORAL | 3 refills | Status: DC

## 2019-11-25 NOTE — Patient Instructions (Addendum)
Ok to stop the amlodipine  Please take all new medication as prescribed - the toprol xl 25 mg per day  Please continue all other medications as before, including the generic flexeril  Please have the pharmacy call with any other refills you may need.  Please continue your efforts at being more active, low cholesterol diet, and weight control.  You are otherwise up to date with prevention measures today.  Please keep your appointments with your specialists as you may have planned  Please make an Appointment to return in 6 months, or sooner if needed, also with Lab Appointment for testing done 3-5 days before at the [facility] (so this is for TWO appointments - please see the scheduling desk as you leave)

## 2019-11-25 NOTE — Assessment & Plan Note (Addendum)
Mild, likely essential, for start toprol xl 25 qd, consider increased dose for uncontrolled HTN  I spent 31 minutes preparing to see the patient by review of recent labs, imaging and procedures, obtaining and reviewing separately obtained history, communicating with the patient and family or caregiver, ordering medications, tests or procedures, and documenting clinical information in the EHR including the differential Dx, treatment, and any further evaluation and other management of tremor, HTN, low vit d, DM, HLD

## 2019-11-25 NOTE — Assessment & Plan Note (Signed)

## 2019-11-25 NOTE — Assessment & Plan Note (Signed)
stable overall by history and exam, recent data reviewed with pt, and pt to continue medical treatment as before,  to f/u any worsening symptoms or concerns  

## 2019-11-25 NOTE — Assessment & Plan Note (Signed)
To d/c amlodipine,  to f/u any worsening symptoms or concerns

## 2019-11-25 NOTE — Assessment & Plan Note (Signed)
Mild, ok to increase the vit d otc from 1000 to 2000 units per day

## 2019-11-25 NOTE — Progress Notes (Signed)
Subjective:    Patient ID: Neil [name], male    DOB: 11-15-1940, 79 y.o.   MRN: [id number]  HPI  Here for wellness and f/u;  Overall doing ok;  Pt denies Chest pain, worsening SOB, DOE, wheezing, orthopnea, PND, worsening LE edema, palpitations, dizziness or syncope.  Pt denies neurological change such as new headache, facial or extremity weakness.  Pt denies polydipsia, polyuria, or low sugar symptoms. Pt states overall good compliance with treatment and medications, good tolerability, and has been trying to follow appropriate diet.  Pt denies worsening depressive symptoms, suicidal ideation or panic. No fever, night sweats, wt loss, loss of appetite, or other constitutional symptoms.  Pt states good ability with ADL's, has low fall risk, home safety reviewed and adequate, no other significant changes in hearing or vision, and only occasionally active with exercise. S/p covid shots x 2.   Pt continues to have recurring LBP without change in severity, bowel or bladder change, fever, wt loss,  worsening LE pain/numbness/weakness, gait change or falls. Hs not tolerated tizanidine, and flexeril now always working for left, and aspercreme with lidocaine helps more. .  Also with new worsening bilateral tremor ongiong with more difficulty writing and holding the coffee cup   Past Medical History:  Diagnosis Date  . ALLERGIC RHINITIS 12/04/2007  . BENIGN PROSTATIC HYPERTROPHY 06/05/2007  . BUNDLE BRANCH BLOCK, RIGHT 07/21/2009  . BURSITIS, RIGHT SHOULDER 11/06/2010  . Choledocholithiasis   . CKD (chronic kidney disease) stage 3, GFR 30-59 ml/min 08/02/2017  . COLONIC POLYPS, HX OF 06/05/2007   tubular adenoma also 02/19/2013  . Coronary artery calcification seen on CT scan 03/07/2016  . DIABETES MELLITUS, TYPE [name] 12/04/2007   pt states not diabetic-no meds 02-05-13 PV  . DIVERTICULOSIS, COLON 07/21/2009  . ERECTILE DYSFUNCTION 06/05/2007  . Gallstones   . HYPERLIPIDEMIA 06/05/2007  . HYPERTENSION  06/05/2007  . Hypotension   . INGUINAL HERNIA, RIGHT, SMALL 11/06/2010  . Liver abscess 05/23/2015  . SKIN LESION 06/03/2008   Past Surgical History:  Procedure Laterality Date  . CHOLECYSTECTOMY N/A 03/02/2015   Procedure: LAPAROSCOPIC CHOLECYSTECTOMY;  Surgeon: Fanny Skates, MD;  Location: [facility];  Service: General;  Laterality: N/A;  . COLONOSCOPY    . ERCP N/A 03/01/2015   Procedure: ENDOSCOPIC RETROGRADE CHOLANGIOPANCREATOGRAPHY (ERCP);  Surgeon: Irene Shipper, MD;  Location: [facility];  Service: Endoscopy;  Laterality: N/A;  Dr Dalbert Batman wants patient to stay overnight after ERCP  . ERCP N/A 05/03/2015   Procedure: ENDOSCOPIC RETROGRADE CHOLANGIOPANCREATOGRAPHY (ERCP);  Surgeon: Irene Shipper, MD;  Location: [facility];  Service: Endoscopy;  Laterality: N/A;  . FINGER SURGERY Left   . HEMORRHOID SURGERY    . INGUINAL HERNIA REPAIR Right    8'14 repair  . POLYPECTOMY    . TONSILLECTOMY      reports that he quit smoking about 14 years ago. He has never used smokeless tobacco. He reports that he does not drink alcohol or use drugs. family history includes Brain cancer (age of onset: 12) in his mother; Colon cancer (age of onset: 22) in his mother. Allergies  Allergen Reactions  . Statins     REACTION: muscle aches  . Zanaflex [Tizanidine] Other (See Comments)    Dizzy sleepy  . Zetia [Ezetimibe] Other (See Comments)    Statins= Muscle and bone pain   Current Outpatient Medications on File Prior to Visit  Medication Sig Dispense Refill  . acetaminophen (TYLENOL) 500 MG tablet Take 1,500 mg by  mouth 2 (two) times daily.     . Alpha-D-Galactosidase (BEANO PO) Take by mouth.    [name] amLODipine (NORVASC) 2.5 MG tablet Take 1 tablet (2.5 mg total) by mouth daily. 90 tablet 3  . benazepril (LOTENSIN) 40 MG tablet TAKE 1 TABLET BY MOUTH  DAILY 90 tablet 3  . cholecalciferol (VITAMIN D) 1000 units tablet Take 1,000 Units by mouth daily.    . citalopram (CELEXA) 10 MG tablet Take 1 tablet (10  mg total) by mouth daily. 90 tablet 3  . cyclobenzaprine (FLEXERIL) 5 MG tablet Take 1 tablet (5 mg total) by mouth 3 (three) times daily as needed for muscle spasms. 60 tablet 1  . fenofibrate 160 MG tablet TAKE 1 TABLET BY MOUTH AT  BEDTIME 90 tablet 3  . sodium chloride (OCEAN) 0.65 % nasal spray Place 1 spray into the nose daily as needed for congestion.     [name] terazosin (HYTRIN) 2 MG capsule TAKE 1 CAPSULE BY MOUTH  DAILY 90 capsule 3   No current facility-administered medications on file prior to visit.   Review of Systems All otherwise neg per pt     Objective:   Physical Exam BP 132/78   Pulse 86   Temp 98 F (36.7 C)   Ht 5\' 8"  (1.727 m)   Wt 186 lb (84.4 kg)   SpO2 100%   BMI 28.28 kg/m  VS noted,  Constitutional: Pt appears in NAD HENT: Head: NCAT.  Right Ear: External ear normal.  Left Ear: External ear normal.  Eyes: . Pupils are equal, round, and reactive to light. Conjunctivae and EOM are normal Nose: without d/c or deformity Neck: Neck supple. Gross normal ROM Cardiovascular: Normal rate and regular rhythm.   Pulmonary/Chest: Effort normal and breath sounds without rales or wheezing.  Abd:  Soft, NT, ND, + BS, no organomegaly Neurological: Pt is alert. At baseline orientation, motor grossly intact, + mild tremor Skin: Skin is warm. No rashes, other new lesions, no LE edema Psychiatric: Pt behavior is normal without agitation  All otherwise neg per pt Lab Results  Component Value Date   WBC 6.5 11/20/2019   HGB 14.6 11/20/2019   HCT 44.4 11/20/2019   PLT 234.0 11/20/2019   GLUCOSE 143 (H) 11/20/2019   CHOL 191 11/20/2019   TRIG 227.0 (H) 11/20/2019   HDL 35.00 (L) 11/20/2019   LDLDIRECT 109.0 11/20/2019   LDLCALC 92 07/04/2018   ALT 20 11/20/2019   AST 16 11/20/2019   NA 142 11/20/2019   K 4.6 11/20/2019   CL 103 11/20/2019   CREATININE 1.41 11/20/2019   BUN 19 11/20/2019   CO2 30 11/20/2019   TSH 0.67 11/20/2019   PSA 1.90 11/20/2019   INR  1.25 05/23/2015   HGBA1C 6.8 (H) 11/20/2019   MICROALBUR 5.6 (H) 07/08/2015          Assessment & Plan:

## 2019-12-05 ENCOUNTER — Encounter: Payer: Self-pay | Admitting: Internal Medicine

## 2019-12-05 MED ORDER — CYCLOBENZAPRINE HCL 5 MG PO TABS: 5.0000 mg | ORAL_TABLET | Freq: Three times a day (TID) | ORAL | 1 refills | Status: DC | PRN

## 2019-12-05 NOTE — Telephone Encounter (Signed)
Done erx 

## 2020-01-12 ENCOUNTER — Encounter: Payer: Self-pay | Admitting: Internal Medicine

## 2020-02-18 ENCOUNTER — Other Ambulatory Visit: Payer: Self-pay | Admitting: Internal Medicine

## 2020-03-17 ENCOUNTER — Other Ambulatory Visit: Payer: Self-pay | Admitting: Internal Medicine

## 2020-05-18 ENCOUNTER — Encounter: Payer: Self-pay | Admitting: Internal Medicine

## 2020-05-19 ENCOUNTER — Other Ambulatory Visit (INDEPENDENT_AMBULATORY_CARE_PROVIDER_SITE_OTHER): Payer: Medicare Other

## 2020-05-19 DIAGNOSIS — E119 Type 2 diabetes mellitus without complications: Secondary | ICD-10-CM

## 2020-05-19 LAB — LIPID PANEL
Cholesterol: 181 mg/dL (ref 0–200)
HDL: 34.4 mg/dL — ABNORMAL LOW (ref >39.00)
LDL Cholesterol: 109 mg/dL — ABNORMAL HIGH (ref 0–99)
NonHDL: 146.85
Total CHOL/HDL Ratio: 5
Triglycerides: 189 mg/dL — ABNORMAL HIGH (ref 0.0–149.0)
VLDL: 37.8 mg/dL (ref 0.0–40.0)

## 2020-05-19 LAB — BASIC METABOLIC PANEL
BUN: 25 mg/dL — ABNORMAL HIGH (ref 6–23)
CO2: 29 mEq/L (ref 19–32)
Calcium: 9.7 mg/dL (ref 8.4–10.5)
Chloride: 103 mEq/L (ref 96–112)
Creatinine, Ser: 1.46 mg/dL (ref 0.40–1.50)
GFR: 46.69 mL/min — ABNORMAL LOW (ref >60.00)
Glucose, Bld: 134 mg/dL — ABNORMAL HIGH (ref 70–99)
Potassium: 4.2 mEq/L (ref 3.5–5.1)
Sodium: 138 mEq/L (ref 135–145)

## 2020-05-19 LAB — HEPATIC FUNCTION PANEL
ALT: 20 U/L (ref 0–53)
AST: 17 U/L (ref 0–37)
Albumin: 4.3 g/dL (ref 3.5–5.2)
Alkaline Phosphatase: 29 U/L — ABNORMAL LOW (ref 39–117)
Bilirubin, Direct: 0.1 mg/dL (ref 0.0–0.3)
Total Bilirubin: 0.5 mg/dL (ref 0.2–1.2)
Total Protein: 7.3 g/dL (ref 6.0–8.3)

## 2020-05-19 LAB — HEMOGLOBIN A1C: Hgb A1c MFr Bld: 6.8 % — ABNORMAL HIGH (ref 4.6–6.5)

## 2020-05-23 ENCOUNTER — Ambulatory Visit: Payer: Medicare Other | Admitting: Internal Medicine

## 2020-05-24 ENCOUNTER — Other Ambulatory Visit: Payer: Self-pay

## 2020-05-24 ENCOUNTER — Ambulatory Visit (INDEPENDENT_AMBULATORY_CARE_PROVIDER_SITE_OTHER): Payer: Medicare Other | Admitting: Internal Medicine

## 2020-05-24 ENCOUNTER — Encounter: Payer: Self-pay | Admitting: Internal Medicine

## 2020-05-24 VITALS — BP 140/70 | HR 71 | Temp 98.1°F | Ht 68.0 in | Wt 178.0 lb

## 2020-05-24 DIAGNOSIS — E785 Hyperlipidemia, unspecified: Secondary | ICD-10-CM

## 2020-05-24 DIAGNOSIS — I10 Essential (primary) hypertension: Secondary | ICD-10-CM

## 2020-05-24 DIAGNOSIS — N183 Chronic kidney disease, stage 3 unspecified: Secondary | ICD-10-CM

## 2020-05-24 DIAGNOSIS — R739 Hyperglycemia, unspecified: Secondary | ICD-10-CM | POA: Diagnosis not present

## 2020-05-24 DIAGNOSIS — Z1159 Encounter for screening for other viral diseases: Secondary | ICD-10-CM

## 2020-05-24 DIAGNOSIS — R0600 Dyspnea, unspecified: Secondary | ICD-10-CM | POA: Diagnosis not present

## 2020-05-24 DIAGNOSIS — R0609 Other forms of dyspnea: Secondary | ICD-10-CM | POA: Insufficient documentation

## 2020-05-24 NOTE — Progress Notes (Signed)
Subjective:    Patient ID: Neil [name], male    DOB: 05/16/1941, 79 y.o.   MRN: [id number]  HPI  Here to f/u; overall doing ok,  Pt denies chest pain, wheezing, orthopnea, PND, increased LE swelling, palpitations, dizziness or syncope, but does have mild worsening doe/sob for unclear reasons.    Pt denies new neurological symptoms such as new headache, or facial or extremity weakness or numbness.  Pt denies polydipsia, polyuria, or low sugar episode.  Pt states overall good compliance with meds, mostly trying to follow appropriate diet, with wt overall stable,  but little exercise however.  Pt to make eye doctor appt soon.  Lost wt with better diet.   Wt Readings from Last 3 Encounters:  05/24/20 178 lb (80.7 kg)  11/25/19 186 lb (84.4 kg)  05/25/19 182 lb (82.6 kg)  Fell 4 times in past 6 mo with off balance from walking too far and back/legs gave out, last fall > 1 mo Past Medical History:  Diagnosis Date  . ALLERGIC RHINITIS 12/04/2007  . BENIGN PROSTATIC HYPERTROPHY 06/05/2007  . BUNDLE BRANCH BLOCK, RIGHT 07/21/2009  . BURSITIS, RIGHT SHOULDER 11/06/2010  . Choledocholithiasis   . CKD (chronic kidney disease) stage 3, GFR 30-59 ml/min 08/02/2017  . COLONIC POLYPS, HX OF 06/05/2007   tubular adenoma also 02/19/2013  . Coronary artery calcification seen on CT scan 03/07/2016  . DIABETES MELLITUS, TYPE [name] 12/04/2007   pt states not diabetic-no meds 02-05-13 PV  . DIVERTICULOSIS, COLON 07/21/2009  . ERECTILE DYSFUNCTION 06/05/2007  . Gallstones   . HYPERLIPIDEMIA 06/05/2007  . HYPERTENSION 06/05/2007  . Hypotension   . INGUINAL HERNIA, RIGHT, SMALL 11/06/2010  . Liver abscess 05/23/2015  . SKIN LESION 06/03/2008   Past Surgical History:  Procedure Laterality Date  . CHOLECYSTECTOMY N/A 03/02/2015   Procedure: LAPAROSCOPIC CHOLECYSTECTOMY;  Surgeon: Fanny Skates, MD;  Location: [facility];  Service: General;  Laterality: N/A;  . COLONOSCOPY    . ERCP N/A 03/01/2015   Procedure: ENDOSCOPIC  RETROGRADE CHOLANGIOPANCREATOGRAPHY (ERCP);  Surgeon: Irene Shipper, MD;  Location: [facility];  Service: Endoscopy;  Laterality: N/A;  Dr Dalbert Batman wants patient to stay overnight after ERCP  . ERCP N/A 05/03/2015   Procedure: ENDOSCOPIC RETROGRADE CHOLANGIOPANCREATOGRAPHY (ERCP);  Surgeon: Irene Shipper, MD;  Location: [facility];  Service: Endoscopy;  Laterality: N/A;  . FINGER SURGERY Left   . HEMORRHOID SURGERY    . INGUINAL HERNIA REPAIR Right    8'14 repair  . POLYPECTOMY    . TONSILLECTOMY      reports that he quit smoking about 14 years ago. He has never used smokeless tobacco. He reports that he does not drink alcohol and does not use drugs. family history includes Brain cancer (age of onset: 69) in his mother; Colon cancer (age of onset: 10) in his mother. Allergies  Allergen Reactions  . Statins     REACTION: muscle aches  . Zanaflex [Tizanidine] Other (See Comments)    Dizzy sleepy  . Zetia [Ezetimibe] Other (See Comments)    Statins= Muscle and bone pain   Current Outpatient Medications on File Prior to Visit  Medication Sig Dispense Refill  . acetaminophen (TYLENOL) 500 MG tablet Take 1,500 mg by mouth 2 (two) times daily.     . Alpha-D-Galactosidase (BEANO PO) Take by mouth.    [name] aspirin (EQ ASPIRIN LOW DOSE) 81 MG EC tablet     . benazepril (LOTENSIN) 40 MG tablet TAKE 1 TABLET BY  MOUTH  DAILY 90 tablet 3  . cholecalciferol (VITAMIN D) 1000 units tablet Take 1,000 Units by mouth daily.    . citalopram (CELEXA) 10 MG tablet TAKE 1 TABLET BY MOUTH  DAILY 90 tablet 2  . Cyanocobalamin (B-12) 1000 MCG TABS     . cyclobenzaprine (FLEXERIL) 5 MG tablet TAKE 1 TABLET BY MOUTH 3  TIMES DAILY AS NEEDED FOR  MUSCLE SPASM(S) 180 tablet 1  . fenofibrate 160 MG tablet TAKE 1 TABLET BY MOUTH AT  BEDTIME 90 tablet 3  . metoprolol succinate (TOPROL-XL) 25 MG 24 hr tablet Take 1 tablet (25 mg total) by mouth daily. 90 tablet 3  . sodium chloride (OCEAN) 0.65 % nasal spray Place 1 spray  into the nose daily as needed for congestion.     [name] terazosin (HYTRIN) 2 MG capsule TAKE 1 CAPSULE BY MOUTH  DAILY 90 capsule 3   No current facility-administered medications on file prior to visit.   Review of Systems All otherwise neg per pt    Objective:   Physical Exam BP 140/70 (BP Location: Left Arm, Patient Position: Sitting, Cuff Size: Large)   Pulse 71   Temp 98.1 F (36.7 C) (Oral)   Ht 5\' 8"  (1.727 m)   Wt 178 lb (80.7 kg)   SpO2 95%   BMI 27.06 kg/m  VS noted,  Constitutional: Pt appears in NAD HENT: Head: NCAT.  Right Ear: External ear normal.  Left Ear: External ear normal.  Eyes: . Pupils are equal, round, and reactive to light. Conjunctivae and EOM are normal Nose: without d/c or deformity Neck: Neck supple. Gross normal ROM Cardiovascular: Normal rate and regular rhythm.   Pulmonary/Chest: Effort normal and breath sounds without rales or wheezing.  Abd:  Soft, NT, ND, + BS, no organomegaly Neurological: Pt is alert. At baseline orientation, motor grossly intact Skin: Skin is warm. No rashes, other new lesions, no LE edema Psychiatric: Pt behavior is normal without agitation  All otherwise neg per pt Lab Results  Component Value Date   WBC 6.5 11/20/2019   HGB 14.6 11/20/2019   HCT 44.4 11/20/2019   PLT 234.0 11/20/2019   GLUCOSE 134 (H) 05/19/2020   CHOL 181 05/19/2020   TRIG 189.0 (H) 05/19/2020   HDL 34.40 (L) 05/19/2020   LDLDIRECT 109.0 11/20/2019   LDLCALC 109 (H) 05/19/2020   ALT 20 05/19/2020   AST 17 05/19/2020   NA 138 05/19/2020   K 4.2 05/19/2020   CL 103 05/19/2020   CREATININE 1.46 05/19/2020   BUN 25 (H) 05/19/2020   CO2 29 05/19/2020   TSH 0.67 11/20/2019   PSA 1.90 11/20/2019   INR 1.25 05/23/2015   HGBA1C 6.8 (H) 05/19/2020   MICROALBUR 5.6 (H) 07/08/2015      Assessment & Plan:

## 2020-05-24 NOTE — Patient Instructions (Signed)
You will be contacted regarding the referral for: stress testing and echocardiogram  Please continue all other medications as before, and refills have been done if requested.  Please have the pharmacy call with any other refills you may need.  Please continue your efforts at being more active, low cholesterol diet, and weight control.  Please keep your appointments with your specialists as you may have planned  Please make an Appointment to return in 6 months, or sooner if needed, also with Lab Appointment for testing done 3-5 days before at the [facility] (so this is for TWO appointments - please see the scheduling desk as you leave)

## 2020-05-26 DIAGNOSIS — H40033 Anatomical narrow angle, bilateral: Secondary | ICD-10-CM | POA: Diagnosis not present

## 2020-05-26 DIAGNOSIS — H1013 Acute atopic conjunctivitis, bilateral: Secondary | ICD-10-CM | POA: Diagnosis not present

## 2020-05-28 ENCOUNTER — Encounter: Payer: Self-pay | Admitting: Internal Medicine

## 2020-05-28 NOTE — Assessment & Plan Note (Signed)
stable overall by history and exam, recent data reviewed with pt, and pt to continue medical treatment as before,  to f/u any worsening symptoms or concerns  

## 2020-05-28 NOTE — Assessment & Plan Note (Addendum)
Etiology unclaer, for stress test, and echocardiogram  I spent 31 minutes in preparing to see the patient by review of recent labs, imaging and procedures, obtaining and reviewing separately obtained history, communicating with the patient and family or caregiver, ordering medications, tests or procedures, and documenting clinical information in the EHR including the differential Dx, treatment, and any further evaluation and other management of doe, hyperglycemia, htn, hld, ckd

## 2020-06-08 ENCOUNTER — Encounter: Payer: Self-pay | Admitting: Internal Medicine

## 2020-06-08 DIAGNOSIS — R079 Chest pain, unspecified: Secondary | ICD-10-CM

## 2020-06-08 DIAGNOSIS — R0600 Dyspnea, unspecified: Secondary | ICD-10-CM

## 2020-06-23 ENCOUNTER — Other Ambulatory Visit (HOSPITAL_COMMUNITY): Payer: Medicare Other

## 2020-06-24 ENCOUNTER — Telehealth (HOSPITAL_COMMUNITY): Payer: Self-pay | Admitting: *Deleted

## 2020-06-24 NOTE — Telephone Encounter (Signed)
Patient given detailed instructions per Myocardial Perfusion Study Information Sheet for the test on 06/27/20 at 10:15. Patient notified to arrive 15 minutes early and that it is imperative to arrive on time for appointment to keep from having the test rescheduled.  If you need to cancel or reschedule your appointment, please call the office within 24 hours of your appointment. . Patient verbalized understanding.Neil [name]

## 2020-06-27 ENCOUNTER — Ambulatory Visit (HOSPITAL_COMMUNITY): Payer: Medicare Other | Attending: Cardiology

## 2020-06-27 ENCOUNTER — Other Ambulatory Visit: Payer: Self-pay

## 2020-06-27 ENCOUNTER — Encounter: Payer: Self-pay | Admitting: Internal Medicine

## 2020-06-27 DIAGNOSIS — R079 Chest pain, unspecified: Secondary | ICD-10-CM | POA: Diagnosis not present

## 2020-06-27 LAB — MYOCARDIAL PERFUSION IMAGING
LV dias vol: 87 mL (ref 62–150)
LV sys vol: 32 mL
Peak HR: 63 {beats}/min
Rest HR: 55 {beats}/min
SDS: 2
SRS: 0
SSS: 2
TID: 0.97

## 2020-06-27 MED ORDER — REGADENOSON 0.4 MG/5ML IV SOLN
0.4000 mg | Freq: Once | INTRAVENOUS | Status: AC
  Administered 2020-06-27: 0.4 mg via INTRAVENOUS

## 2020-06-27 MED ORDER — TECHNETIUM TC 99M TETROFOSMIN IV KIT
10.3000 | PACK | Freq: Once | INTRAVENOUS | Status: AC | PRN
  Administered 2020-06-27: 10.3 via INTRAVENOUS
  Filled 2020-06-27: qty 11

## 2020-06-27 MED ORDER — TECHNETIUM TC 99M TETROFOSMIN IV KIT
30.2000 | PACK | Freq: Once | INTRAVENOUS | Status: AC | PRN
  Administered 2020-06-27: 30.2 via INTRAVENOUS
  Filled 2020-06-27: qty 31

## 2020-06-30 ENCOUNTER — Encounter: Payer: Self-pay | Admitting: Internal Medicine

## 2020-07-01 ENCOUNTER — Ambulatory Visit: Payer: Medicare Other

## 2020-07-06 ENCOUNTER — Other Ambulatory Visit: Payer: Self-pay

## 2020-07-06 ENCOUNTER — Ambulatory Visit (HOSPITAL_COMMUNITY): Payer: Medicare Other | Attending: Cardiology

## 2020-07-06 DIAGNOSIS — R079 Chest pain, unspecified: Secondary | ICD-10-CM | POA: Insufficient documentation

## 2020-07-06 DIAGNOSIS — R0600 Dyspnea, unspecified: Secondary | ICD-10-CM | POA: Insufficient documentation

## 2020-07-06 LAB — ECHOCARDIOGRAM COMPLETE
Area-P 1/2: 2.07 cm2
S' Lateral: 2.9 cm

## 2020-07-07 ENCOUNTER — Ambulatory Visit (INDEPENDENT_AMBULATORY_CARE_PROVIDER_SITE_OTHER): Payer: Medicare Other

## 2020-07-07 ENCOUNTER — Encounter: Payer: Self-pay | Admitting: Internal Medicine

## 2020-07-07 DIAGNOSIS — Z23 Encounter for immunization: Secondary | ICD-10-CM | POA: Diagnosis not present

## 2020-07-07 NOTE — Progress Notes (Signed)
High Dose flu vacc given w/o complications.

## 2020-07-30 ENCOUNTER — Other Ambulatory Visit: Payer: Self-pay | Admitting: Internal Medicine

## 2020-07-30 ENCOUNTER — Encounter: Payer: Self-pay | Admitting: Internal Medicine

## 2020-08-01 MED ORDER — CYCLOBENZAPRINE HCL 5 MG PO TABS
ORAL_TABLET | ORAL | 1 refills | Status: DC
Start: 2020-08-01 — End: 2020-12-02

## 2020-08-22 ENCOUNTER — Other Ambulatory Visit: Payer: Self-pay | Admitting: Internal Medicine

## 2020-08-31 ENCOUNTER — Other Ambulatory Visit: Payer: Self-pay | Admitting: Internal Medicine

## 2020-08-31 NOTE — Telephone Encounter (Signed)
Please refill as per office routine med refill policy (all routine meds refilled for 3 mo or monthly per pt preference up to one year from last visit, then month to month grace period for 3 mo, then further med refills will have to be denied)  

## 2020-09-05 DIAGNOSIS — H40033 Anatomical narrow angle, bilateral: Secondary | ICD-10-CM | POA: Diagnosis not present

## 2020-09-05 DIAGNOSIS — H1013 Acute atopic conjunctivitis, bilateral: Secondary | ICD-10-CM | POA: Diagnosis not present

## 2020-10-26 ENCOUNTER — Other Ambulatory Visit: Payer: Self-pay | Admitting: Internal Medicine

## 2020-11-17 ENCOUNTER — Other Ambulatory Visit (INDEPENDENT_AMBULATORY_CARE_PROVIDER_SITE_OTHER): Payer: Medicare Other

## 2020-11-17 DIAGNOSIS — R739 Hyperglycemia, unspecified: Secondary | ICD-10-CM | POA: Diagnosis not present

## 2020-11-17 LAB — HEPATIC FUNCTION PANEL
ALT: 13 U/L (ref 0–53)
AST: 13 U/L (ref 0–37)
Albumin: 4.3 g/dL (ref 3.5–5.2)
Alkaline Phosphatase: 28 U/L — ABNORMAL LOW (ref 39–117)
Bilirubin, Direct: 0.1 mg/dL (ref 0.0–0.3)
Total Bilirubin: 0.6 mg/dL (ref 0.2–1.2)
Total Protein: 7.4 g/dL (ref 6.0–8.3)

## 2020-11-17 LAB — LIPID PANEL
Cholesterol: 193 mg/dL (ref 0–200)
HDL: 33.5 mg/dL — ABNORMAL LOW (ref >39.00)
NonHDL: 159.9
Total CHOL/HDL Ratio: 6
Triglycerides: 266 mg/dL — ABNORMAL HIGH (ref 0.0–149.0)
VLDL: 53.2 mg/dL — ABNORMAL HIGH (ref 0.0–40.0)

## 2020-11-17 LAB — BASIC METABOLIC PANEL
BUN: 22 mg/dL (ref 6–23)
CO2: 30 mEq/L (ref 19–32)
Calcium: 9.6 mg/dL (ref 8.4–10.5)
Chloride: 105 mEq/L (ref 96–112)
Creatinine, Ser: 1.39 mg/dL (ref 0.40–1.50)
GFR: 48.54 mL/min — ABNORMAL LOW (ref >60.00)
Glucose, Bld: 145 mg/dL — ABNORMAL HIGH (ref 70–99)
Potassium: 4 mEq/L (ref 3.5–5.1)
Sodium: 141 mEq/L (ref 135–145)

## 2020-11-17 LAB — HEMOGLOBIN A1C: Hgb A1c MFr Bld: 7.3 % — ABNORMAL HIGH (ref 4.6–6.5)

## 2020-11-17 LAB — LDL CHOLESTEROL, DIRECT: Direct LDL: 111 mg/dL

## 2020-11-24 ENCOUNTER — Telehealth: Payer: Self-pay | Admitting: Internal Medicine

## 2020-11-24 ENCOUNTER — Other Ambulatory Visit: Payer: Self-pay

## 2020-11-24 ENCOUNTER — Encounter: Payer: Self-pay | Admitting: Internal Medicine

## 2020-11-24 ENCOUNTER — Ambulatory Visit (INDEPENDENT_AMBULATORY_CARE_PROVIDER_SITE_OTHER): Payer: Medicare Other | Admitting: Internal Medicine

## 2020-11-24 VITALS — BP 142/74 | HR 78 | Temp 98.3°F | Ht 68.0 in | Wt 176.0 lb

## 2020-11-24 DIAGNOSIS — Z0000 Encounter for general adult medical examination without abnormal findings: Secondary | ICD-10-CM

## 2020-11-24 DIAGNOSIS — M67919 Unspecified disorder of synovium and tendon, unspecified shoulder: Secondary | ICD-10-CM | POA: Diagnosis not present

## 2020-11-24 DIAGNOSIS — N183 Chronic kidney disease, stage 3 unspecified: Secondary | ICD-10-CM

## 2020-11-24 DIAGNOSIS — E559 Vitamin D deficiency, unspecified: Secondary | ICD-10-CM | POA: Diagnosis not present

## 2020-11-24 DIAGNOSIS — E785 Hyperlipidemia, unspecified: Secondary | ICD-10-CM

## 2020-11-24 DIAGNOSIS — M719 Bursopathy, unspecified: Secondary | ICD-10-CM | POA: Diagnosis not present

## 2020-11-24 DIAGNOSIS — R251 Tremor, unspecified: Secondary | ICD-10-CM

## 2020-11-24 DIAGNOSIS — M545 Low back pain, unspecified: Secondary | ICD-10-CM | POA: Diagnosis not present

## 2020-11-24 DIAGNOSIS — R739 Hyperglycemia, unspecified: Secondary | ICD-10-CM | POA: Diagnosis not present

## 2020-11-24 DIAGNOSIS — Z0001 Encounter for general adult medical examination with abnormal findings: Secondary | ICD-10-CM

## 2020-11-24 DIAGNOSIS — I10 Essential (primary) hypertension: Secondary | ICD-10-CM

## 2020-11-24 DIAGNOSIS — R972 Elevated prostate specific antigen [PSA]: Secondary | ICD-10-CM

## 2020-11-24 MED ORDER — METOPROLOL SUCCINATE ER 50 MG PO TB24: 50.0000 mg | ORAL_TABLET | Freq: Every day | ORAL | 3 refills | Status: DC

## 2020-11-24 NOTE — Patient Instructions (Signed)
Ok to increase the toprol XL to 50 mg per day  Ok to increase the vit d to 2000 units per day  Ok to try the flexeril 10 mg, and if tolerable, you can call for a prescription  Please continue all other medications as before, and refills have been done if requested.  Please have the pharmacy call with any other refills you may need.  Please continue your efforts at being more active, low cholesterol diet, and weight control.  You are otherwise up to date with prevention measures today.  Please keep your appointments with your specialists as you may have planned  Please make an Appointment to return in 6 months, or sooner if needed, also with Lab Appointment for testing done 3-5 days before at the [facility] (so this is for TWO appointments - please see the scheduling desk as you leave)  Due to the ongoing Covid 19 pandemic, our lab now requires an appointment for any labs done at our office.  If you need labs done and do not have an appointment, please call our office ahead of time to schedule before presenting to the lab for your testing.

## 2020-11-24 NOTE — Telephone Encounter (Signed)
PT Wants to get labs at [facility] before his next appointment (8.11.22)  Please enter orders

## 2020-11-24 NOTE — Progress Notes (Signed)
Patient ID: Neil [name], male   DOB: 10/11/1941, 80 y.o.   MRN: [id number]         Chief Complaint:: wellness exam and Follow-up  worsening hand tremors, dm, htn, low vit d, low back pain, right shoulder pain, and hld       HPI:  Neil [name] is a 80 y.o. male here for wellness exam; pt is up to date with referrals and immunizations but decliens tdap; reminded due for colonoscopy may 2022, and eye exam July 2022   Wt Readings from Last 3 Encounters:  11/24/20 176 lb (79.8 kg)  06/27/20 178 lb (80.7 kg)  05/24/20 178 lb (80.7 kg)   BP Readings from Last 3 Encounters:  11/24/20 (!) 142/74  05/24/20 140/70  11/25/19 132/78   Immunization History  Administered Date(s) Administered  . Fluad Quad(high Dose 65+) 06/05/2019, 07/07/2020  . Influenza Split 07/04/2012  . Influenza Whole 07/21/2009, 11/06/2010  . Influenza, High Dose Seasonal PF 07/03/2016, 08/02/2017, 07/10/2018  . Influenza,inj,Quad PF,6+ Mos 06/24/2013, 06/25/2014, 07/12/2015  . PFIZER(Purple Top)SARS-COV-2 Vaccination 10/27/2019, 11/16/2019, 07/10/2020  . Pneumococcal Conjugate-13 03/17/2014  . Pneumococcal Polysaccharide-23 11/14/2011  . Td 07/21/2009   There are no preventive care reminders to display for this patient.       Also c/o 1)  Mild worsening sugars in the higher 100s recently with less actiivity but  Pt denies polydipsia, polyuria.  Pt states overall good compliance with meds, trying to follow lower cholesterol, diabetic diet.  Bilateral hand tremors have been worsening, mild, intermittent but getting harder to use the hand for daily tasks of writing and holding cups.  Pt is quite adamant BP is < 140/90 at home and doesnot feel med change is needed at this time.  Has only been taking 1000 units daily for low Vit D recently.  Also has increased bilateral low back pain x 3 wks, some better with the flexeril 5 mg but asking for increase to 10 in a trial to see if can do better; Pt continues to have  recurring LBP but no bowel or bladder change, fever, wt loss,  worsening LE pain/numbness/weakness, gait change or falls.Worse to bend and stand up, nothing else makes better or worse.  Also has known elevated lipids, statin intolerant, declines referral to lipid clinic for now.  Also has a soreness to the right deltoid area, mild, intermittent, worse to raise the arm overhead and cant completely get there, better to keep the arm down, has not tried other such as tylenol.   Pt denies polydipsia, polyuria.  Past Medical History:  Diagnosis Date  . ALLERGIC RHINITIS 12/04/2007  . BENIGN PROSTATIC HYPERTROPHY 06/05/2007  . BUNDLE BRANCH BLOCK, RIGHT 07/21/2009  . BURSITIS, RIGHT SHOULDER 11/06/2010  . Choledocholithiasis   . CKD (chronic kidney disease) stage 3, GFR 30-59 ml/min (HCC) 08/02/2017  . COLONIC POLYPS, HX OF 06/05/2007   tubular adenoma also 02/19/2013  . Coronary artery calcification seen on CT scan 03/07/2016  . DIABETES MELLITUS, TYPE [name] 12/04/2007   pt states not diabetic-no meds 02-05-13 PV  . DIVERTICULOSIS, COLON 07/21/2009  . ERECTILE DYSFUNCTION 06/05/2007  . Gallstones   . HYPERLIPIDEMIA 06/05/2007  . HYPERTENSION 06/05/2007  . Hypotension   . INGUINAL HERNIA, RIGHT, SMALL 11/06/2010  . Liver abscess 05/23/2015  . SKIN LESION 06/03/2008   Past Surgical History:  Procedure Laterality Date  . CHOLECYSTECTOMY N/A 03/02/2015   Procedure: LAPAROSCOPIC CHOLECYSTECTOMY;  Surgeon: Fanny Skates, MD;  Location: [facility];  Service: General;  Laterality: N/A;  . COLONOSCOPY    . ERCP N/A 03/01/2015   Procedure: ENDOSCOPIC RETROGRADE CHOLANGIOPANCREATOGRAPHY (ERCP);  Surgeon: Irene Shipper, MD;  Location: [facility];  Service: Endoscopy;  Laterality: N/A;  Dr Dalbert Batman wants patient to stay overnight after ERCP  . ERCP N/A 05/03/2015   Procedure: ENDOSCOPIC RETROGRADE CHOLANGIOPANCREATOGRAPHY (ERCP);  Surgeon: Irene Shipper, MD;  Location: [facility];  Service: Endoscopy;  Laterality: N/A;  .  FINGER SURGERY Left   . HEMORRHOID SURGERY    . INGUINAL HERNIA REPAIR Right    8'14 repair  . POLYPECTOMY    . TONSILLECTOMY      reports that he quit smoking about 15 years ago. He has never used smokeless tobacco. He reports that he does not drink alcohol and does not use drugs. family history includes Brain cancer (age of onset: 45) in his mother; Colon cancer (age of onset: 2) in his mother. Allergies  Allergen Reactions  . Statins     REACTION: muscle aches  . Zanaflex [Tizanidine] Other (See Comments)    Dizzy sleepy  . Zetia [Ezetimibe] Other (See Comments)    Statins= Muscle and bone pain   Current Outpatient Medications on File Prior to Visit  Medication Sig Dispense Refill  . acetaminophen (TYLENOL) 500 MG tablet Take 1,500 mg by mouth 2 (two) times daily.     . Alpha-D-Galactosidase (BEANO PO) Take by mouth.    [name] aspirin (EQ ASPIRIN LOW DOSE) 81 MG EC tablet     . benazepril (LOTENSIN) 40 MG tablet TAKE 1 TABLET BY MOUTH  DAILY 90 tablet 3  . cholecalciferol (VITAMIN D) 1000 units tablet Take 1,000 Units by mouth daily.    . citalopram (CELEXA) 10 MG tablet TAKE 1 TABLET BY MOUTH  DAILY 90 tablet 2  . Cyanocobalamin (B-12) 1000 MCG TABS     . cyclobenzaprine (FLEXERIL) 5 MG tablet TAKE 1 TABLET BY MOUTH 3  TIMES DAILY AS NEEDED FOR  MUSCLE SPASM(S) 270 tablet 1  . fenofibrate 160 MG tablet TAKE 1 TABLET BY MOUTH AT  BEDTIME 90 tablet 3  . sodium chloride (OCEAN) 0.65 % nasal spray Place 1 spray into the nose daily as needed for congestion.     [name] terazosin (HYTRIN) 2 MG capsule TAKE 1 CAPSULE BY MOUTH  DAILY 90 capsule 3   No current facility-administered medications on file prior to visit.        ROS:  All others reviewed and negative.  Objective        PE:  BP (!) 142/74   Pulse 78   Temp 98.3 F (36.8 C) (Oral)   Ht 5\' 8"  (1.727 m)   Wt 176 lb (79.8 kg)   SpO2 97%   BMI 26.76 kg/m                 Constitutional: Pt appears in NAD               HENT:  Head: NCAT.                Right Ear: External ear normal.                 Left Ear: External ear normal.                Eyes: . Pupils are equal, round, and reactive to light. Conjunctivae and EOM are normal  Nose: without d/c or deformity               Neck: Neck supple. Gross normal ROM               Cardiovascular: Normal rate and regular rhythm.                 Pulmonary/Chest: Effort normal and breath sounds without rales or wheezing.                Abd:  Soft, NT, ND, + BS, no organomegaly               Neurological: Pt is alert. At baseline orientation, motor grossly intact               Skin: Skin is warm. No rashes, no other new lesions, LE edema - none               Psychiatric: Pt behavior is normal without agitation   Micro: none  Cardiac tracings I have personally interpreted today:  none  Pertinent Radiological findings (summarize): none   Lab Results  Component Value Date   WBC 6.5 11/20/2019   HGB 14.6 11/20/2019   HCT 44.4 11/20/2019   PLT 234.0 11/20/2019   GLUCOSE 145 (H) 11/17/2020   CHOL 193 11/17/2020   TRIG 266.0 (H) 11/17/2020   HDL 33.50 (L) 11/17/2020   LDLDIRECT 111.0 11/17/2020   LDLCALC 109 (H) 05/19/2020   ALT 13 11/17/2020   AST 13 11/17/2020   NA 141 11/17/2020   K 4.0 11/17/2020   CL 105 11/17/2020   CREATININE 1.39 11/17/2020   BUN 22 11/17/2020   CO2 30 11/17/2020   TSH 0.67 11/20/2019   PSA 1.90 11/20/2019   INR 1.25 05/23/2015   HGBA1C 7.3 (H) 11/17/2020   MICROALBUR 5.6 (H) 07/08/2015   Assessment/Plan:  Neil [name] is a 80 y.o. White or Caucasian [1] male with  has a past medical history of ALLERGIC RHINITIS (12/04/2007), BENIGN PROSTATIC HYPERTROPHY (06/05/2007), BUNDLE BRANCH BLOCK, RIGHT (07/21/2009), BURSITIS, RIGHT SHOULDER (11/06/2010), Choledocholithiasis, CKD (chronic kidney disease) stage 3, GFR 30-59 ml/min (HCC) (08/02/2017), COLONIC POLYPS, HX OF (06/05/2007), Coronary artery calcification seen on CT scan  (03/07/2016), DIABETES MELLITUS, TYPE [name] (12/04/2007), DIVERTICULOSIS, COLON (07/21/2009), ERECTILE DYSFUNCTION (06/05/2007), Gallstones, HYPERLIPIDEMIA (06/05/2007), HYPERTENSION (06/05/2007), Hypotension, INGUINAL HERNIA, RIGHT, SMALL (11/06/2010), Liver abscess (05/23/2015), and SKIN LESION (06/03/2008). Encounter for well adult exam with abnormal findings Age and sex appropriate education and counseling updated with regular exercise and diet Referrals for preventative services - for colonoscopy soon Immunizations addressed - declines Tdap Smoking counseling  - none needed, former smoker Evidence for depression or other mood disorder - none significant Most recent labs reviewed. I have personally reviewed and have noted: 1) the patient's medical and social history 2) The patient's current medications and supplements 3) The patient's height, weight, and BMI have been recorded in the chart   Hyperglycemia Lab Results  Component Value Date   HGBA1C 7.3 (H) 11/17/2020   Stable, pt to continue current medical treatment  - diet   Essential hypertension, benign .pt stated  BP good at home; BP Readings from Last 3 Encounters:  11/24/20 (!) 142/74  05/24/20 140/70  11/25/19 132/78   Stable, pt to continue medical treatment toprol, lotensin   Current Outpatient Medications (Cardiovascular):  .  metoprolol succinate (TOPROL-XL) 50 MG 24 hr tablet, Take 1 tablet (50 mg total) by mouth daily. Take with or immediately following a  meal. .  benazepril (LOTENSIN) 40 MG tablet, TAKE 1 TABLET BY MOUTH  DAILY .  fenofibrate 160 MG tablet, TAKE 1 TABLET BY MOUTH AT  BEDTIME .  terazosin (HYTRIN) 2 MG capsule, TAKE 1 CAPSULE BY MOUTH  DAILY  Current Outpatient Medications (Respiratory):  .  sodium chloride (OCEAN) 0.65 % nasal spray, Place 1 spray into the nose daily as needed for congestion.   Current Outpatient Medications (Analgesics):  .  acetaminophen (TYLENOL) 500 MG tablet, Take 1,500 mg by mouth  2 (two) times daily.  [name]  aspirin (EQ ASPIRIN LOW DOSE) 81 MG EC tablet,   Current Outpatient Medications (Hematological):  [name]  Cyanocobalamin (B-12) 1000 MCG TABS,   Current Outpatient Medications (Other):  [name]  Alpha-D-Galactosidase (BEANO PO), Take by mouth. .  cholecalciferol (VITAMIN D) 1000 units tablet, Take 1,000 Units by mouth daily. .  citalopram (CELEXA) 10 MG tablet, TAKE 1 TABLET BY MOUTH  DAILY .  cyclobenzaprine (FLEXERIL) 5 MG tablet, TAKE 1 TABLET BY MOUTH 3  TIMES DAILY AS NEEDED FOR  MUSCLE SPASM(S)   Hyperlipidemia Lab Results  Component Value Date   LDLCALC 109 (H) 05/19/2020   Stable, pt to continue current statin  - fenofibrate  - declines referral lipid ciinic   Increased prostate specific antigen (PSA) velocity Asympt, for f/u psa now and 6 mo  Disorder of bursae and tendons in shoulder region Mild to mod recurrent, declines sport med for now  Low back pain Ok for trial increased flexeril 10 prn  Tremor With mild worsening, for inreased toprol xl 50 qd  Vitamin D deficiency Mild persistent, to increaes the vit d to 2000 u qd  Followup: Return in about 6 months (around 05/24/2021).  [name], MD 11/27/2020 10:59 AM [facility]

## 2020-11-25 ENCOUNTER — Telehealth: Payer: Self-pay

## 2020-11-25 NOTE — Telephone Encounter (Signed)
Pt notified of lab orders

## 2020-11-25 NOTE — Addendum Note (Signed)
Addended by: Biagio Borg on: 11/25/2020 03:52 PM   Modules accepted: Orders

## 2020-11-25 NOTE — Telephone Encounter (Signed)
Ok these are ordered for [place]

## 2020-11-27 ENCOUNTER — Encounter: Payer: Self-pay | Admitting: Internal Medicine

## 2020-11-27 DIAGNOSIS — M545 Low back pain, unspecified: Secondary | ICD-10-CM | POA: Insufficient documentation

## 2020-11-27 NOTE — Assessment & Plan Note (Signed)
Asympt, for f/u psa now and 6 mo

## 2020-11-27 NOTE — Assessment & Plan Note (Signed)
.  pt stated  BP good at home; BP Readings from Last 3 Encounters:  11/24/20 (!) 142/74  05/24/20 140/70  11/25/19 132/78   Stable, pt to continue medical treatment toprol, lotensin   Current Outpatient Medications (Cardiovascular):  .  metoprolol succinate (TOPROL-XL) 50 MG 24 hr tablet, Take 1 tablet (50 mg total) by mouth daily. Take with or immediately following a meal. .  benazepril (LOTENSIN) 40 MG tablet, TAKE 1 TABLET BY MOUTH  DAILY .  fenofibrate 160 MG tablet, TAKE 1 TABLET BY MOUTH AT  BEDTIME .  terazosin (HYTRIN) 2 MG capsule, TAKE 1 CAPSULE BY MOUTH  DAILY  Current Outpatient Medications (Respiratory):  .  sodium chloride (OCEAN) 0.65 % nasal spray, Place 1 spray into the nose daily as needed for congestion.   Current Outpatient Medications (Analgesics):  .  acetaminophen (TYLENOL) 500 MG tablet, Take 1,500 mg by mouth 2 (two) times daily.  [name]  aspirin (EQ ASPIRIN LOW DOSE) 81 MG EC tablet,   Current Outpatient Medications (Hematological):  [name]  Cyanocobalamin (B-12) 1000 MCG TABS,   Current Outpatient Medications (Other):  [name]  Alpha-D-Galactosidase (BEANO PO), Take by mouth. .  cholecalciferol (VITAMIN D) 1000 units tablet, Take 1,000 Units by mouth daily. .  citalopram (CELEXA) 10 MG tablet, TAKE 1 TABLET BY MOUTH  DAILY .  cyclobenzaprine (FLEXERIL) 5 MG tablet, TAKE 1 TABLET BY MOUTH 3  TIMES DAILY AS NEEDED FOR  MUSCLE SPASM(S)

## 2020-11-27 NOTE — Assessment & Plan Note (Signed)
Lab Results  Component Value Date   HGBA1C 7.3 (H) 11/17/2020   Stable, pt to continue current medical treatment  - diet

## 2020-11-27 NOTE — Assessment & Plan Note (Signed)
Lab Results  Component Value Date   LDLCALC 109 (H) 05/19/2020   Stable, pt to continue current statin  - fenofibrate  - declines referral lipid ciinic

## 2020-11-27 NOTE — Assessment & Plan Note (Signed)
With mild worsening, for inreased toprol xl 50 qd

## 2020-11-27 NOTE — Assessment & Plan Note (Addendum)
[place] for trial increased flexeril 10 prn, c/w msk strain

## 2020-11-27 NOTE — Assessment & Plan Note (Signed)
Age and sex appropriate education and counseling updated with regular exercise and diet Referrals for preventative services - for colonoscopy soon Immunizations addressed - declines Tdap Smoking counseling  - none needed, former smoker Evidence for depression or other mood disorder - none significant Most recent labs reviewed. I have personally reviewed and have noted: 1) the patient's medical and social history 2) The patient's current medications and supplements 3) The patient's height, weight, and BMI have been recorded in the chart

## 2020-11-27 NOTE — Assessment & Plan Note (Signed)
Mild persistent, to increaes the vit d to 2000 u qd

## 2020-11-27 NOTE — Assessment & Plan Note (Signed)
Mild to mod recurrent, declines sport med for now

## 2020-12-01 ENCOUNTER — Encounter: Payer: Self-pay | Admitting: Internal Medicine

## 2020-12-02 MED ORDER — CYCLOBENZAPRINE HCL 5 MG PO TABS: ORAL_TABLET | ORAL | 1 refills | Status: DC

## 2020-12-02 NOTE — Addendum Note (Signed)
Addended by: Biagio Borg on: 12/02/2020 01:08 PM   Modules accepted: Orders

## 2021-01-13 ENCOUNTER — Encounter: Payer: Self-pay | Admitting: Internal Medicine

## 2021-02-28 ENCOUNTER — Encounter: Payer: Self-pay | Admitting: Internal Medicine

## 2021-05-06 ENCOUNTER — Other Ambulatory Visit: Payer: Self-pay | Admitting: Internal Medicine

## 2021-05-18 ENCOUNTER — Other Ambulatory Visit (INDEPENDENT_AMBULATORY_CARE_PROVIDER_SITE_OTHER): Payer: Medicare Other

## 2021-05-18 DIAGNOSIS — R739 Hyperglycemia, unspecified: Secondary | ICD-10-CM | POA: Diagnosis not present

## 2021-05-18 DIAGNOSIS — N183 Chronic kidney disease, stage 3 unspecified: Secondary | ICD-10-CM | POA: Diagnosis not present

## 2021-05-18 DIAGNOSIS — E785 Hyperlipidemia, unspecified: Secondary | ICD-10-CM

## 2021-05-18 DIAGNOSIS — R972 Elevated prostate specific antigen [PSA]: Secondary | ICD-10-CM | POA: Diagnosis not present

## 2021-05-18 LAB — HEPATIC FUNCTION PANEL
ALT: 19 U/L (ref 0–53)
AST: 15 U/L (ref 0–37)
Albumin: 4.4 g/dL (ref 3.5–5.2)
Alkaline Phosphatase: 27 U/L — ABNORMAL LOW (ref 39–117)
Bilirubin, Direct: 0.1 mg/dL (ref 0.0–0.3)
Total Bilirubin: 0.6 mg/dL (ref 0.2–1.2)
Total Protein: 7.2 g/dL (ref 6.0–8.3)

## 2021-05-18 LAB — PSA: PSA: 2.16 ng/mL (ref 0.10–4.00)

## 2021-05-18 LAB — BASIC METABOLIC PANEL
BUN: 20 mg/dL (ref 6–23)
CO2: 27 mEq/L (ref 19–32)
Calcium: 9.7 mg/dL (ref 8.4–10.5)
Chloride: 103 mEq/L (ref 96–112)
Creatinine, Ser: 1.39 mg/dL (ref 0.40–1.50)
GFR: 48.37 mL/min — ABNORMAL LOW (ref >60.00)
Glucose, Bld: 138 mg/dL — ABNORMAL HIGH (ref 70–99)
Potassium: 4.2 mEq/L (ref 3.5–5.1)
Sodium: 140 mEq/L (ref 135–145)

## 2021-05-18 LAB — LIPID PANEL
Cholesterol: 180 mg/dL (ref 0–200)
HDL: 33.7 mg/dL — ABNORMAL LOW (ref >39.00)
NonHDL: 146.06
Total CHOL/HDL Ratio: 5
Triglycerides: 207 mg/dL — ABNORMAL HIGH (ref 0.0–149.0)
VLDL: 41.4 mg/dL — ABNORMAL HIGH (ref 0.0–40.0)

## 2021-05-18 LAB — LDL CHOLESTEROL, DIRECT: Direct LDL: 106 mg/dL

## 2021-05-18 LAB — VITAMIN D 25 HYDROXY (VIT D DEFICIENCY, FRACTURES): VITD: 48 ng/mL (ref 30.00–100.00)

## 2021-05-18 LAB — HEMOGLOBIN A1C: Hgb A1c MFr Bld: 7.4 % — ABNORMAL HIGH (ref 4.6–6.5)

## 2021-05-18 LAB — PHOSPHORUS: Phosphorus: 3.3 mg/dL (ref 2.3–4.6)

## 2021-05-20 LAB — PTH, INTACT AND CALCIUM
Calcium: 9.5 mg/dL (ref 8.6–10.3)
PTH: 28 pg/mL (ref 16–77)

## 2021-05-25 ENCOUNTER — Ambulatory Visit (INDEPENDENT_AMBULATORY_CARE_PROVIDER_SITE_OTHER): Payer: Medicare Other | Admitting: Internal Medicine

## 2021-05-25 ENCOUNTER — Encounter: Payer: Self-pay | Admitting: Internal Medicine

## 2021-05-25 ENCOUNTER — Other Ambulatory Visit: Payer: Self-pay

## 2021-05-25 VITALS — BP 142/70 | HR 63 | Temp 98.3°F | Ht 68.0 in | Wt 177.6 lb

## 2021-05-25 DIAGNOSIS — I10 Essential (primary) hypertension: Secondary | ICD-10-CM | POA: Diagnosis not present

## 2021-05-25 DIAGNOSIS — E559 Vitamin D deficiency, unspecified: Secondary | ICD-10-CM | POA: Diagnosis not present

## 2021-05-25 DIAGNOSIS — E782 Mixed hyperlipidemia: Secondary | ICD-10-CM

## 2021-05-25 DIAGNOSIS — E1165 Type 2 diabetes mellitus with hyperglycemia: Secondary | ICD-10-CM

## 2021-05-25 DIAGNOSIS — N1831 Chronic kidney disease, stage 3a: Secondary | ICD-10-CM | POA: Diagnosis not present

## 2021-05-25 DIAGNOSIS — G720 Drug-induced myopathy: Secondary | ICD-10-CM

## 2021-05-25 DIAGNOSIS — R972 Elevated prostate specific antigen [PSA]: Secondary | ICD-10-CM

## 2021-05-25 DIAGNOSIS — E538 Deficiency of other specified B group vitamins: Secondary | ICD-10-CM

## 2021-05-25 DIAGNOSIS — T466X5A Adverse effect of antihyperlipidemic and antiarteriosclerotic drugs, initial encounter: Secondary | ICD-10-CM

## 2021-05-25 MED ORDER — DAPAGLIFLOZIN PROPANEDIOL 5 MG PO TABS: 5.0000 mg | ORAL_TABLET | Freq: Every day | ORAL | 3 refills | Status: DC

## 2021-05-25 NOTE — Patient Instructions (Addendum)
Please take all new medication as prescribed - the farxiga  Please call if you change your mind about the Lipid Clinic referral  Please continue all other medications as before, and refills have been done if requested.  Please have the pharmacy call with any other refills you may need.  Please continue your efforts at being more active, low cholesterol diet, and weight control  Please keep your appointments with your specialists as you may have planned  Please make an Appointment to return in 6 months, or sooner if needed, also with Lab Appointment for testing done 3-5 days before at the [facility] (so this is for TWO appointments - please see the scheduling desk as you leave)  Due to the ongoing Covid 19 pandemic, our lab now requires an appointment for any labs done at our office.  If you need labs done and do not have an appointment, please call our office ahead of time to schedule before presenting to the lab for your testing.

## 2021-05-25 NOTE — Progress Notes (Signed)
Patient ID: Neil [name], male   DOB: 1941/05/16, 80 y.o.   MRN: [id number]        Chief Complaint: follow up HTN, HLD and hyperglycemia , increasing psa velocity       HPI:  Neil [name] is a 80 y.o. male here overall doing ok, Pt denies chest pain, increased sob or doe, wheezing, orthopnea, PND, increased LE swelling, palpitations, dizziness or syncope.   Pt denies polydipsia, polyuria, or new focal neuro s/s.   Pt denies fever, wt loss, night sweats, loss of appetite, or other constitutional symptoms  Still due for colonoscopy but plans to call himself, as well as eye exam.  Has been statin intolerant in past, goal LDL < 70.  Denies urinary symptoms such as dysuria, frequency, urgency, flank pain, hematuria or n/v, fever, chills.  BP has been < 140/90 at home.  No other new complaints Has been taking Vit D Wt Readings from Last 3 Encounters:  05/25/21 177 lb 9.6 oz (80.6 kg)  11/24/20 176 lb (79.8 kg)  06/27/20 178 lb (80.7 kg)   BP Readings from Last 3 Encounters:  05/25/21 (!) 142/70  11/24/20 (!) 142/74  05/24/20 140/70         Past Medical History:  Diagnosis Date   ALLERGIC RHINITIS 12/04/2007   BENIGN PROSTATIC HYPERTROPHY 06/05/2007   BUNDLE BRANCH BLOCK, RIGHT 07/21/2009   BURSITIS, RIGHT SHOULDER 11/06/2010   Choledocholithiasis    CKD (chronic kidney disease) stage 3, GFR 30-59 ml/min (HCC) 08/02/2017   COLONIC POLYPS, HX OF 06/05/2007   tubular adenoma also 02/19/2013   Coronary artery calcification seen on CT scan 03/07/2016   DIABETES MELLITUS, TYPE [name] 12/04/2007   pt states not diabetic-no meds 02-05-13 PV   DIVERTICULOSIS, COLON 07/21/2009   ERECTILE DYSFUNCTION 06/05/2007   Gallstones    HYPERLIPIDEMIA 06/05/2007   HYPERTENSION 06/05/2007   Hypotension    INGUINAL HERNIA, RIGHT, SMALL 11/06/2010   Liver abscess 05/23/2015   SKIN LESION 06/03/2008   Past Surgical History:  Procedure Laterality Date   CHOLECYSTECTOMY N/A 03/02/2015   Procedure: LAPAROSCOPIC  CHOLECYSTECTOMY;  Surgeon: Fanny Skates, MD;  Location: [facility];  Service: General;  Laterality: N/A;   COLONOSCOPY     ERCP N/A 03/01/2015   Procedure: ENDOSCOPIC RETROGRADE CHOLANGIOPANCREATOGRAPHY (ERCP);  Surgeon: Irene Shipper, MD;  Location: [facility];  Service: Endoscopy;  Laterality: N/A;  Dr Dalbert Batman wants patient to stay overnight after ERCP   ERCP N/A 05/03/2015   Procedure: ENDOSCOPIC RETROGRADE CHOLANGIOPANCREATOGRAPHY (ERCP);  Surgeon: Irene Shipper, MD;  Location: [facility];  Service: Endoscopy;  Laterality: N/A;   FINGER SURGERY Left    HEMORRHOID SURGERY     INGUINAL HERNIA REPAIR Right    8'14 repair   POLYPECTOMY     TONSILLECTOMY      reports that he quit smoking about 15 years ago. He has never used smokeless tobacco. He reports that he does not drink alcohol and does not use drugs. family history includes Brain cancer (age of onset: 50) in his mother; Colon cancer (age of onset: 83) in his mother. Allergies  Allergen Reactions   Statins     REACTION: muscle aches   Zanaflex [Tizanidine] Other (See Comments)    Dizzy sleepy   Zetia [Ezetimibe] Other (See Comments)    Statins= Muscle and bone pain   Current Outpatient Medications on File Prior to Visit  Medication Sig Dispense Refill   acetaminophen (TYLENOL) 500 MG tablet Take 1,500  mg by mouth 2 (two) times daily.      Alpha-D-Galactosidase (BEANO PO) Take by mouth.     aspirin 81 MG EC tablet      benazepril (LOTENSIN) 40 MG tablet TAKE 1 TABLET BY MOUTH  DAILY 90 tablet 3   cholecalciferol (VITAMIN D) 1000 units tablet Take 2,000 Units by mouth daily.     citalopram (CELEXA) 10 MG tablet TAKE 1 TABLET BY MOUTH  DAILY 90 tablet 2   Cyanocobalamin (B-12) 1000 MCG TABS      cyclobenzaprine (FLEXERIL) 5 MG tablet TAKE 1 TABLET BY MOUTH 3  TIMES DAILY AS NEEDED FOR  MUSCLE SPASM(S) 270 tablet 1   fenofibrate 160 MG tablet TAKE 1 TABLET BY MOUTH AT  BEDTIME 90 tablet 3   metoprolol succinate (TOPROL-XL) 50 MG 24  hr tablet Take 1 tablet (50 mg total) by mouth daily. Take with or immediately following a meal. 90 tablet 3   sodium chloride (OCEAN) 0.65 % nasal spray Place 1 spray into the nose daily as needed for congestion.      terazosin (HYTRIN) 2 MG capsule TAKE 1 CAPSULE BY MOUTH  DAILY 90 capsule 3   PFIZER-BIONT COVID-19 VAC-TRIS SUSP injection      No current facility-administered medications on file prior to visit.        ROS:  All others reviewed and negative.  Objective        PE:  BP (!) 142/70 (BP Location: Left Arm, Patient Position: Sitting, Cuff Size: Normal)   Pulse 63   Temp 98.3 F (36.8 C) (Oral)   Ht '5\' 8"'$  (1.727 m)   Wt 177 lb 9.6 oz (80.6 kg)   SpO2 98%   BMI 27.00 kg/m                 Constitutional: Pt appears in NAD               HENT: Head: NCAT.                Right Ear: External ear normal.                 Left Ear: External ear normal.                Eyes: . Pupils are equal, round, and reactive to light. Conjunctivae and EOM are normal               Nose: without d/c or deformity               Neck: Neck supple. Gross normal ROM               Cardiovascular: Normal rate and regular rhythm.                 Pulmonary/Chest: Effort normal and breath sounds without rales or wheezing.                Abd:  Soft, NT, ND, + BS, no organomegaly               Neurological: Pt is alert. At baseline orientation, motor grossly intact               Skin: Skin is warm. No rashes, no other new lesions, LE edema - none               Psychiatric: Pt behavior is normal without agitation   Micro: none  Cardiac tracings I have personally interpreted  today:  none  Pertinent Radiological findings (summarize): none   Lab Results  Component Value Date   WBC 6.5 11/20/2019   HGB 14.6 11/20/2019   HCT 44.4 11/20/2019   PLT 234.0 11/20/2019   GLUCOSE 138 (H) 05/18/2021   CHOL 180 05/18/2021   TRIG 207.0 (H) 05/18/2021   HDL 33.70 (L) 05/18/2021   LDLDIRECT 106.0 05/18/2021    LDLCALC 109 (H) 05/19/2020   ALT 19 05/18/2021   AST 15 05/18/2021   NA 140 05/18/2021   K 4.2 05/18/2021   CL 103 05/18/2021   CREATININE 1.39 05/18/2021   BUN 20 05/18/2021   CO2 27 05/18/2021   TSH 0.67 11/20/2019   PSA 2.16 05/18/2021   INR 1.25 05/23/2015   HGBA1C 7.4 (H) 05/18/2021   MICROALBUR 5.6 (H) 07/08/2015   Lab Results  Component Value Date   PSA 2.16 05/18/2021   PSA 1.90 11/20/2019   PSA 1.48 05/21/2019    Assessment/Plan:  Neil [name] is a 80 y.o. White or Caucasian [1] male with  has a past medical history of ALLERGIC RHINITIS (12/04/2007), BENIGN PROSTATIC HYPERTROPHY (06/05/2007), BUNDLE BRANCH BLOCK, RIGHT (07/21/2009), BURSITIS, RIGHT SHOULDER (11/06/2010), Choledocholithiasis, CKD (chronic kidney disease) stage 3, GFR 30-59 ml/min (HCC) (08/02/2017), COLONIC POLYPS, HX OF (06/05/2007), Coronary artery calcification seen on CT scan (03/07/2016), DIABETES MELLITUS, TYPE [name] (12/04/2007), DIVERTICULOSIS, COLON (07/21/2009), ERECTILE DYSFUNCTION (06/05/2007), Gallstones, HYPERLIPIDEMIA (06/05/2007), HYPERTENSION (06/05/2007), Hypotension, INGUINAL HERNIA, RIGHT, SMALL (11/06/2010), Liver abscess (05/23/2015), and SKIN LESION (06/03/2008).  Increased prostate specific antigen (PSA) velocity Lab Results  Component Value Date   PSA 2.16 05/18/2021   PSA 1.90 11/20/2019   PSA 1.48 05/21/2019   PSA appears to be increasing over short period of time; pt declines urology for now, will continue to monitor at next visit  Vitamin D deficiency Last vitamin D Lab Results  Component Value Date   VD25OH 48.00 05/18/2021   Stable, cont oral replacement   Hyperlipidemia Lab Results  Component Value Date   LDLCALC 109 (H) 05/19/2020   Uncontrolled, goal ldl < 70, has been statin intolerant,  pt to continue low chol diet, declines referral lipid clinic or zetia   Diabetes ([place]) Lab Results  Component Value Date   HGBA1C 7.4 (H) 05/18/2021   Mild uncontrolled, goal  A1c < 7, to add farxiga 5 mg, pt to continue DM diet   Essential hypertension, benign BP Readings from Last 3 Encounters:  05/25/21 (!) 142/70  11/24/20 (!) 142/74  05/24/20 140/70   Mild uncontrolled,, pt to continue medical treatment lotensin, toprol as declines change in tx   CKD (chronic kidney disease) stage 3, GFR 30-59 ml/min (HCC) Lab Results  Component Value Date   CREATININE 1.39 05/18/2021   Stable overall, cont to avoid nephrotoxins   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  [name], MD 05/30/2021 1:59 PM [facility]

## 2021-05-26 ENCOUNTER — Encounter: Payer: Self-pay | Admitting: Internal Medicine

## 2021-05-29 DIAGNOSIS — H1013 Acute atopic conjunctivitis, bilateral: Secondary | ICD-10-CM | POA: Diagnosis not present

## 2021-05-29 DIAGNOSIS — H40033 Anatomical narrow angle, bilateral: Secondary | ICD-10-CM | POA: Diagnosis not present

## 2021-05-30 ENCOUNTER — Encounter: Payer: Self-pay | Admitting: Internal Medicine

## 2021-05-30 DIAGNOSIS — G720 Drug-induced myopathy: Secondary | ICD-10-CM | POA: Insufficient documentation

## 2021-05-30 NOTE — Addendum Note (Signed)
Addended by: Biagio Borg on: 05/30/2021 02:03 PM   Modules accepted: Orders

## 2021-05-30 NOTE — Assessment & Plan Note (Signed)
Last vitamin D Lab Results  Component Value Date   VD25OH 48.00 05/18/2021   Stable, cont oral replacement

## 2021-05-30 NOTE — Assessment & Plan Note (Signed)
Lab Results  Component Value Date   LDLCALC 109 (H) 05/19/2020   Uncontrolled, goal ldl < 70, has been statin intolerant,  pt to continue low chol diet, declines referral lipid clinic or zetia

## 2021-05-30 NOTE — Assessment & Plan Note (Signed)
Has been statin intolerant, to cont DM low chol diet, declines lipid clinic referral for now or zetia

## 2021-05-30 NOTE — Assessment & Plan Note (Addendum)
Lab Results  Component Value Date   HGBA1C 7.4 (H) 05/18/2021   Mild uncontrolled, goal A1c < 7, to add farxiga 5 mg, pt to continue DM diet

## 2021-05-30 NOTE — Assessment & Plan Note (Signed)
BP Readings from Last 3 Encounters:  05/25/21 (!) 142/70  11/24/20 (!) 142/74  05/24/20 140/70   Mild uncontrolled,, pt to continue medical treatment lotensin, toprol as declines change in tx

## 2021-05-30 NOTE — Assessment & Plan Note (Signed)
Lab Results  Component Value Date   CREATININE 1.39 05/18/2021   Stable overall, cont to avoid nephrotoxins

## 2021-05-30 NOTE — Assessment & Plan Note (Signed)
Lab Results  Component Value Date   PSA 2.16 05/18/2021   PSA 1.90 11/20/2019   PSA 1.48 05/21/2019   PSA appears to be increasing over short period of time; pt declines urology for now, will continue to monitor at next visit

## 2021-06-01 ENCOUNTER — Encounter: Payer: Self-pay | Admitting: Internal Medicine

## 2021-06-21 ENCOUNTER — Ambulatory Visit (INDEPENDENT_AMBULATORY_CARE_PROVIDER_SITE_OTHER): Payer: Medicare Other | Admitting: *Deleted

## 2021-06-21 DIAGNOSIS — Z0000 Encounter for general adult medical examination without abnormal findings: Secondary | ICD-10-CM | POA: Diagnosis not present

## 2021-06-21 NOTE — Progress Notes (Signed)
Subjective:   Neil [name] is a 80 y.o. male who presents for Medicare Annual/Subsequent preventive examination.  I connected with  Neil [name] on 06/21/21 by audio enabled telemedicine application and verified that I am speaking with the correct person using two identifiers.   I discussed the limitations of evaluation and management by telemedicine. The patient expressed understanding and agreed to proceed.   Location of patient: Home Location of provider: Office Persons participating in visit: Neil [name] (patient) & Jari Favre, CMA  Review of Systems    Defer to PCP Cardiac Risk Factors include: none     Objective:    There were no vitals filed for this visit. There is no height or weight on file to calculate BMI.  Advanced Directives 06/21/2021 05/25/2019 04/27/2016 02/15/2016 02/08/2016 05/23/2015 05/03/2015  Does Patient Have a Medical Advance Directive? Yes No No No Yes Yes Yes  Type of Advance Directive Living will - - - [place];Living will [place];Living will [place];Living will  Does patient want to make changes to medical advance directive? No - Patient declined - - - No - Patient declined No - Patient declined -  Copy of [place] in Chart? - - - - No - copy requested No - copy requested No - copy requested  Would patient like information on creating a medical advance directive? - Yes (ED - Information included in AVS) No - patient declined information - - - -    Current Medications (verified) Outpatient Encounter Medications as of 06/21/2021  Medication Sig   acetaminophen (TYLENOL) 500 MG tablet Take 1,500 mg by mouth 2 (two) times daily.    Alpha-D-Galactosidase (BEANO PO) Take by mouth.   aspirin 81 MG EC tablet    benazepril (LOTENSIN) 40 MG tablet TAKE 1 TABLET BY MOUTH  DAILY   cholecalciferol (VITAMIN D) 1000 units tablet Take 2,000 Units by mouth daily.   citalopram (CELEXA) 10 MG  tablet TAKE 1 TABLET BY MOUTH  DAILY   Cyanocobalamin (B-12) 1000 MCG TABS    cyclobenzaprine (FLEXERIL) 5 MG tablet TAKE 1 TABLET BY MOUTH 3  TIMES DAILY AS NEEDED FOR  MUSCLE SPASM(S)   dapagliflozin propanediol (FARXIGA) 5 MG TABS tablet Take 1 tablet (5 mg total) by mouth daily before breakfast.   fenofibrate 160 MG tablet TAKE 1 TABLET BY MOUTH AT  BEDTIME   Ibuprofen (IBU-200 PO)    metoprolol succinate (TOPROL-XL) 50 MG 24 hr tablet Take 1 tablet (50 mg total) by mouth daily. Take with or immediately following a meal.   PFIZER-BIONT COVID-19 VAC-TRIS SUSP injection    sodium chloride (OCEAN) 0.65 % nasal spray Place 1 spray into the nose daily as needed for congestion.    terazosin (HYTRIN) 2 MG capsule TAKE 1 CAPSULE BY MOUTH  DAILY   No facility-administered encounter medications on file as of 06/21/2021.    Allergies (verified) Statins, Zanaflex [tizanidine], and Zetia [ezetimibe]   History: Past Medical History:  Diagnosis Date   ALLERGIC RHINITIS 12/04/2007   BENIGN PROSTATIC HYPERTROPHY 06/05/2007   BUNDLE BRANCH BLOCK, RIGHT 07/21/2009   BURSITIS, RIGHT SHOULDER 11/06/2010   Choledocholithiasis    CKD (chronic kidney disease) stage 3, GFR 30-59 ml/min ([place]) 08/02/2017   COLONIC POLYPS, HX OF 06/05/2007   tubular adenoma also 02/19/2013   Coronary artery calcification seen on CT scan 03/07/2016   DIABETES MELLITUS, TYPE [name] 12/04/2007   pt states not diabetic-no meds  02-05-13 PV   DIVERTICULOSIS, COLON 07/21/2009   ERECTILE DYSFUNCTION 06/05/2007   Gallstones    HYPERLIPIDEMIA 06/05/2007   HYPERTENSION 06/05/2007   Hypotension    INGUINAL HERNIA, RIGHT, SMALL 11/06/2010   Liver abscess 05/23/2015   SKIN LESION 06/03/2008   Past Surgical History:  Procedure Laterality Date   CHOLECYSTECTOMY N/A 03/02/2015   Procedure: LAPAROSCOPIC CHOLECYSTECTOMY;  Surgeon: Fanny Skates, MD;  Location: [facility];  Service: General;  Laterality: N/A;   COLONOSCOPY     ERCP N/A 03/01/2015    Procedure: ENDOSCOPIC RETROGRADE CHOLANGIOPANCREATOGRAPHY (ERCP);  Surgeon: Irene Shipper, MD;  Location: [facility];  Service: Endoscopy;  Laterality: N/A;  Dr Dalbert Batman wants patient to stay overnight after ERCP   ERCP N/A 05/03/2015   Procedure: ENDOSCOPIC RETROGRADE CHOLANGIOPANCREATOGRAPHY (ERCP);  Surgeon: Irene Shipper, MD;  Location: [facility];  Service: Endoscopy;  Laterality: N/A;   FINGER SURGERY Left    HEMORRHOID SURGERY     INGUINAL HERNIA REPAIR Right    8'14 repair   POLYPECTOMY     TONSILLECTOMY     Family History  Problem Relation Age of Onset   Colon cancer Mother 60   Brain cancer Mother 1       mets from colon cancer   Rectal cancer Neg Hx    Stomach cancer Neg Hx    Social History   Socioeconomic History   Marital status: Widowed    Spouse name: Not on file   Number of children: Not on file   Years of education: Not on file   Highest education level: Not on file  Occupational History   Occupation: retired  Tobacco Use   Smoking status: Former    Types: Cigarettes    Quit date: 10/15/2005    Years since quitting: 15.6   Smokeless tobacco: Never  Vaping Use   Vaping Use: Never used  Substance and Sexual Activity   Alcohol use: No    Alcohol/week: 0.0 standard drinks   Drug use: No   Sexual activity: Not Currently  Other Topics Concern   Not on file  Social History Narrative   Not on file   Social Determinants of Health   Financial Resource Strain: Low Risk    Difficulty of Paying Living Expenses: Not hard at all  Food Insecurity: No Food Insecurity   Worried About Charity fundraiser in the Last Year: Never true   [place] in the Last Year: Never true  Transportation Needs: No Transportation Needs   Lack of Transportation (Medical): No   Lack of Transportation (Non-Medical): No  Physical Activity: Insufficiently Active   Days of Exercise per Week: 3 days   Minutes of Exercise per Session: 30 min  Stress: No Stress Concern Present    Feeling of Stress : Not at all  Social Connections: Moderately Integrated   Frequency of Communication with Friends and Family: Three times a week   Frequency of Social Gatherings with Friends and Family: Once a week   Attends Religious Services: More than 4 times per year   Active Member of [employer] or Organizations: Yes   Attends Archivist Meetings: Never   Marital Status: Widowed    Tobacco Counseling Counseling given: Not Answered   Clinical Intake:     Pain : 0-10 Pain Type: Chronic pain Pain Location: Back Pain Onset: More than a month ago Pain Frequency: Intermittent Pain Relieving Factors: Pain meds help  Pain Relieving Factors: Pain meds help  BMI -  recorded: 27 Nutritional Status: BMI 25 -29 Overweight Diabetes: No  How often do you need to have someone help you when you read instructions, pamphlets, or other written materials from your doctor or pharmacy?: 1 - Never  Diabetic? No  Interpreter Needed?: No      Activities of Daily Living In your present state of health, do you have any difficulty performing the following activities: 06/21/2021 11/24/2020  Hearing? N N  Vision? N N  Difficulty concentrating or making decisions? Y N  Walking or climbing stairs? N N  Dressing or bathing? N N  Doing errands, shopping? N N  Preparing Food and eating ? N -  Using the Toilet? N -  In the past six months, have you accidently leaked urine? Y -  Do you have problems with loss of bowel control? N -  Managing your Medications? N -  Managing your Finances? N -  Housekeeping or managing your Housekeeping? N -  Some recent data might be hidden    Patient Care Team: Biagio Borg, MD as PCP - Gwenevere Abbot, Docia Chuck, MD as Consulting Physician (Gastroenterology)  Indicate any recent Medical Services you may have received from other than [facility] providers in the past year (date may be approximate).     Assessment:   This is a routine wellness examination for  Michae.  Hearing/Vision screen No results found.  Dietary issues and exercise activities discussed: Current Exercise Habits: Home exercise routine, Type of exercise: walking, Time (Minutes): 30, Frequency (Times/Week): 4, Weekly Exercise (Minutes/Week): 120, Intensity: Mild, Exercise limited by: None identified   Goals Addressed   None    Depression Screen PHQ 2/9 Scores 06/21/2021 05/25/2021 05/24/2020 11/25/2019 05/25/2019 07/10/2018 01/06/2018  PHQ - 2 Score '1 3 1 1 3 '$ 0 0  PHQ- 9 Score '2 3 1 '$ - 3 - -    Fall Risk Fall Risk  06/21/2021 05/25/2021 05/24/2020 11/25/2019 05/25/2019  Falls in the past year? 0 0 0 0 0  Number falls in past yr: 0 0 0 - 0  Injury with Fall? 0 0 0 - 0  Risk for fall due to : - - No Fall Risks - Impaired balance/gait  Follow up - - Falls evaluation completed - -    FALL RISK PREVENTION PERTAINING TO THE HOME:  Any stairs in or around the home? No  If so, are there any without handrails? No  Home free of loose throw rugs in walkways, pet beds, electrical cords, etc? No  Adequate lighting in your home to reduce risk of falls? Yes   ASSISTIVE DEVICES UTILIZED TO PREVENT FALLS:  Life alert? No  Use of a cane, walker or w/c? Yes  Grab bars in the bathroom? Yes  Shower chair or bench in shower? Yes  Elevated toilet seat or a handicapped toilet? No   TIMED UP AND GO:  Was the test performed? No .    Cognitive Function:     Immunizations Immunization History  Administered Date(s) Administered   Fluad Quad(high Dose 65+) 06/05/2019, 07/07/2020   Influenza Split 07/04/2012   Influenza Whole 07/21/2009, 11/06/2010   Influenza, High Dose Seasonal PF 07/03/2016, 08/02/2017, 07/10/2018   Influenza,inj,Quad PF,6+ Mos 06/24/2013, 06/25/2014, 07/12/2015   PFIZER(Purple Top)SARS-COV-2 Vaccination 10/27/2019, 11/16/2019, 07/10/2020, 01/19/2021   Pneumococcal Conjugate-13 03/17/2014   Pneumococcal Polysaccharide-23 11/14/2011   Td 07/21/2009    TDAP status: Due,  Education has been provided regarding the importance of this vaccine. Advised may receive this vaccine at local  pharmacy or Health Dept. Aware to provide a copy of the vaccination record if obtained from local pharmacy or Health Dept. Verbalized acceptance and understanding.  Flu Vaccine status: Due, Education has been provided regarding the importance of this vaccine. Advised may receive this vaccine at local pharmacy or Health Dept. Aware to provide a copy of the vaccination record if obtained from local pharmacy or Health Dept. Verbalized acceptance and understanding.  Pneumococcal vaccine status: Up to date  Covid-19 vaccine status: Completed vaccines  Qualifies for Shingles Vaccine? Yes   Zostavax completed No   Shingrix Completed?: No.    Education has been provided regarding the importance of this vaccine. Patient has been advised to call insurance company to determine out of pocket expense if they have not yet received this vaccine. Advised may also receive vaccine at local pharmacy or Health Dept. Verbalized acceptance and understanding.  Screening Tests Health Maintenance  Topic Date Due   OPHTHALMOLOGY EXAM  04/25/2021   INFLUENZA VACCINE  07/11/2021 (Originally 05/15/2021)   Zoster Vaccines- Shingrix (1 of 2) 08/25/2021 (Originally 04/30/1992)   Hepatitis C Screening  11/24/2021 (Originally 04/30/1959)   TETANUS/TDAP  11/27/2021 (Originally 07/22/2019)   COLONOSCOPY (Pts 45-6yr Insurance coverage will need to be confirmed)  05/25/2022 (Originally 02/28/2021)   HEMOGLOBIN A1C  11/18/2021   FOOT EXAM  11/24/2021   COVID-19 Vaccine  Completed   PNA vac Low Risk Adult  Completed   HPV VACCINES  Aged Out    Health Maintenance  Health Maintenance Due  Topic Date Due   OPHTHALMOLOGY EXAM  04/25/2021    Colorectal cancer screening: Type of screening: Colonoscopy. Completed 02/29/2016. Repeat every 5 years  Lung Cancer Screening: (Low Dose CT Chest recommended if Age 79-80years,  30 pack-year currently smoking OR have quit w/in 15years.) Does qualify.   Lung Cancer Screening Referral: Defer to PCP  Additional Screening:  Hepatitis C Screening: Does qualify; Has not completed   Vision Screening: Recommended annual ophthalmology exams for early detection of glaucoma and other disorders of the eye. Is the patient up to date with their annual eye exam?  Yes  Who is the provider or what is the name of the office in which the patient attends annual eye exams? [facility] If pt is not established with a provider, would they like to be referred to a provider to establish care? No .   Dental Screening: Recommended annual dental exams for proper oral hygiene  Community Resource Referral / Chronic Care Management: CRR required this visit?  No   CCM required this visit?  No      Plan:     I have personally reviewed and noted the following in the patient's chart:   Medical and social history Use of alcohol, tobacco or illicit drugs  Current medications and supplements including opioid prescriptions. Patient is not currently taking opioid prescriptions. Functional ability and status Nutritional status Physical activity Advanced directives List of other physicians Hospitalizations, surgeries, and ER visits in previous 12 months Vitals Screenings to include cognitive, depression, and falls Referrals and appointments  In addition, I have reviewed and discussed with patient certain preventive protocols, quality metrics, and best practice recommendations. A written personalized care plan for preventive services as well as general preventive health recommendations were provided to patient.     [place]annon [place]  06/21/2021   Nurse Notes: 24 minutes non face to face

## 2021-06-21 NOTE — Patient Instructions (Signed)
Health Maintenance, Male Adopting a healthy lifestyle and getting preventive care are important in promoting health and wellness. Ask your health care provider about: The right schedule for you to have regular tests and exams. Things you can do on your own to prevent diseases and keep yourself healthy. What should I know about diet, weight, and exercise? Eat a healthy diet  Eat a diet that includes plenty of vegetables, fruits, low-fat dairy products, and lean protein. Do not eat a lot of foods that are high in solid fats, added sugars, or sodium. Maintain a healthy weight Body mass index (BMI) is a measurement that can be used to identify possible weight problems. It estimates body fat based on height and weight. Your health care provider can help determine your BMI and help you achieve or maintain a healthy weight. Get regular exercise Get regular exercise. This is one of the most important things you can do for your health. Most adults should: Exercise for at least 150 minutes each week. The exercise should increase your heart rate and make you sweat (moderate-intensity exercise). Do strengthening exercises at least twice a week. This is in addition to the moderate-intensity exercise. Spend less time sitting. Even light physical activity can be beneficial. Watch cholesterol and blood lipids Have your blood tested for lipids and cholesterol at 79 years of age, then have this test every 5 years. You may need to have your cholesterol levels checked more often if: Your lipid or cholesterol levels are high. You are older than 79 years of age. You are at high risk for heart disease. What should I know about cancer screening? Many types of cancers can be detected early and may often be prevented. Depending on your health history and family history, you may need to have cancer screening at various ages. This may include screening for: Colorectal cancer. Prostate cancer. Skin cancer. Lung  cancer. What should I know about heart disease, diabetes, and high blood pressure? Blood pressure and heart disease High blood pressure causes heart disease and increases the risk of stroke. This is more likely to develop in people who have high blood pressure readings, are of African descent, or are overweight. Talk with your health care provider about your target blood pressure readings. Have your blood pressure checked: Every 3-5 years if you are 18-39 years of age. Every year if you are 40 years old or older. If you are between the ages of 65 and 75 and are a current or former smoker, ask your health care provider if you should have a one-time screening for abdominal aortic aneurysm (AAA). Diabetes Have regular diabetes screenings. This checks your fasting blood sugar level. Have the screening done: Once every three years after age 45 if you are at a normal weight and have a low risk for diabetes. More often and at a younger age if you are overweight or have a high risk for diabetes. What should I know about preventing infection? Hepatitis B If you have a higher risk for hepatitis B, you should be screened for this virus. Talk with your health care provider to find out if you are at risk for hepatitis B infection. Hepatitis C Blood testing is recommended for: Everyone born from 1945 through 1965. Anyone with known risk factors for hepatitis C. Sexually transmitted infections (STIs) You should be screened each year for STIs, including gonorrhea and chlamydia, if: You are sexually active and are younger than 79 years of age. You are older than 79 years   of age and your health care provider tells you that you are at risk for this type of infection. Your sexual activity has changed since you were last screened, and you are at increased risk for chlamydia or gonorrhea. Ask your health care provider if you are at risk. Ask your health care provider about whether you are at high risk for HIV.  Your health care provider may recommend a prescription medicine to help prevent HIV infection. If you choose to take medicine to prevent HIV, you should first get tested for HIV. You should then be tested every 3 months for as long as you are taking the medicine. Follow these instructions at home: Lifestyle Do not use any products that contain nicotine or tobacco, such as cigarettes, e-cigarettes, and chewing tobacco. If you need help quitting, ask your health care provider. Do not use street drugs. Do not share needles. Ask your health care provider for help if you need support or information about quitting drugs. Alcohol use Do not drink alcohol if your health care provider tells you not to drink. If you drink alcohol: Limit how much you have to 0-2 drinks a day. Be aware of how much alcohol is in your drink. In the [place] one drink equals one 12 oz bottle of beer (355 mL), one 5 oz glass of wine (148 mL), or one 1 oz glass of hard liquor (44 mL). General instructions Schedule regular health, dental, and eye exams. Stay current with your vaccines. Tell your health care provider if: You often feel depressed. You have ever been abused or do not feel safe at home. Summary Adopting a healthy lifestyle and getting preventive care are important in promoting health and wellness. Follow your health care provider's instructions about healthy diet, exercising, and getting tested or screened for diseases. Follow your health care provider's instructions on monitoring your cholesterol and blood pressure. This information is not intended to replace advice given to you by your health care provider. Make sure you discuss any questions you have with your health care provider. Document Revised: 12/09/2020 Document Reviewed: 09/24/2018 Elsevier Patient Education  2022 Elsevier Inc.  

## 2021-06-22 ENCOUNTER — Other Ambulatory Visit: Payer: Self-pay | Admitting: Internal Medicine

## 2021-06-22 ENCOUNTER — Other Ambulatory Visit: Payer: Self-pay

## 2021-06-22 ENCOUNTER — Ambulatory Visit (AMBULATORY_SURGERY_CENTER): Payer: Medicare Other

## 2021-06-22 ENCOUNTER — Telehealth: Payer: Self-pay | Admitting: Internal Medicine

## 2021-06-22 VITALS — Ht 68.0 in | Wt 173.0 lb

## 2021-06-22 DIAGNOSIS — Z800 Family history of malignant neoplasm of digestive organs: Secondary | ICD-10-CM

## 2021-06-22 DIAGNOSIS — Z86010 Personal history of colonic polyps: Secondary | ICD-10-CM

## 2021-06-22 MED ORDER — SUTAB 1479-225-188 MG PO TABS: 1.0000 | ORAL_TABLET | ORAL | 0 refills | Status: DC

## 2021-06-22 NOTE — Progress Notes (Signed)
Pre visit completed via phone call; patient verified name, DOB, and address; No egg or soy allergy known to patient  No issues with past sedation with any surgeries or procedures Patient denies ever being told they had issues or difficulty with intubation  No FH of Malignant Hyperthermia No diet pills per patient No home 02 use per patient  No blood thinners per patient  Pt denies issues with constipation  No A fib or A flutter  EMMI video via MyChart  COVID 19 guidelines implemented in PV today with Pt and RN   Pt is fully vaccinated for Covid x 2 + boosters;  Medicare Coupon given to pt in [facility] today and NO PA's for preps discussed with pt in [facility] today  Discussed with pt there will be an out-of-pocket cost for prep and that varies from $0 to 70 +  dollars   Due to the COVID-19 pandemic we are asking patients to follow certain guidelines.  Pt aware of COVID protocols and [facility] guidelines   Additional time during pre visit going over instructions with patient for patient understanding of information

## 2021-06-22 NOTE — Telephone Encounter (Signed)
Called and spoke with pharmacist- patient coupon information given and price for prep decreased to $40.00. pharmacist advised medication would be ready for pick up after 06/23/2021. Called and spoke with patient- patient advised of this information and is agreeable to complete prep (Sutab) at $40.00. patient advised to contact pharmacy to assess whether prep was ready for pick up; patient also advised to call back to the office should further questions arise;

## 2021-06-22 NOTE — Telephone Encounter (Signed)
Pt called stating that Copay for Sutab is over $100. He thinks that coupon will not lower the price significantly. He is requesting prescription for gavilyte. He was told that it is covered by his insurance.

## 2021-06-26 ENCOUNTER — Encounter: Payer: Self-pay | Admitting: Internal Medicine

## 2021-07-06 ENCOUNTER — Other Ambulatory Visit: Payer: Self-pay

## 2021-07-06 ENCOUNTER — Ambulatory Visit (AMBULATORY_SURGERY_CENTER): Payer: Medicare Other | Admitting: Internal Medicine

## 2021-07-06 ENCOUNTER — Encounter: Payer: Self-pay | Admitting: Internal Medicine

## 2021-07-06 VITALS — BP 140/67 | HR 68 | Temp 98.0°F | Resp 12 | Ht 68.0 in | Wt 173.0 lb

## 2021-07-06 DIAGNOSIS — Z86010 Personal history of colonic polyps: Secondary | ICD-10-CM | POA: Diagnosis not present

## 2021-07-06 DIAGNOSIS — D124 Benign neoplasm of descending colon: Secondary | ICD-10-CM

## 2021-07-06 DIAGNOSIS — K5130 Ulcerative (chronic) rectosigmoiditis without complications: Secondary | ICD-10-CM | POA: Diagnosis not present

## 2021-07-06 DIAGNOSIS — Z800 Family history of malignant neoplasm of digestive organs: Secondary | ICD-10-CM | POA: Diagnosis not present

## 2021-07-06 DIAGNOSIS — K529 Noninfective gastroenteritis and colitis, unspecified: Secondary | ICD-10-CM | POA: Diagnosis not present

## 2021-07-06 MED ORDER — SODIUM CHLORIDE 0.9 % IV SOLN: 500.0000 mL | Freq: Once | INTRAVENOUS | Status: DC

## 2021-07-06 NOTE — Progress Notes (Signed)
Called to room to assist during endoscopic procedure.  Patient ID and intended procedure confirmed with present staff. Received instructions for my participation in the procedure from the performing physician.  

## 2021-07-06 NOTE — Progress Notes (Signed)
PT taken to PACU. Monitors in place. VSS. Report given to RN. 

## 2021-07-06 NOTE — Op Note (Signed)
[facility] Patient Name: Neil [name] Procedure Date: 07/06/2021 2:00 PM MRN: [id number] Endoscopist: Docia Chuck. Henrene Pastor , MD Age: 80 Referring MD:  Date of Birth: 1941-04-07 Gender: Male Account #: [id number] Procedure:                Colonoscopy with cold snare polypectomy x 1; with                            biopsies Indications:              High risk colon cancer surveillance: Personal                            history of adenoma (10 mm or greater in size), High                            risk colon cancer surveillance: Personal history of                            multiple (3 or more) adenomas. Also parent with                            colon cancer. Previous examinations 2010, 2014, 2017 Medicines:                Monitored Anesthesia Care Procedure:                Pre-Anesthesia Assessment:                           - Prior to the procedure, a History and Physical                            was performed, and patient medications and                            allergies were reviewed. The patient's tolerance of                            previous anesthesia was also reviewed. The risks                            and benefits of the procedure and the sedation                            options and risks were discussed with the patient.                            All questions were answered, and informed consent                            was obtained. Prior Anticoagulants: The patient has                            taken no previous anticoagulant or antiplatelet  agents. ASA Grade Assessment: II - A patient with                            mild systemic disease. After reviewing the risks                            and benefits, the patient was deemed in                            satisfactory condition to undergo the procedure.                           After obtaining informed consent, the colonoscope                            was passed under  direct vision. Throughout the                            procedure, the patient's blood pressure, pulse, and                            oxygen saturations were monitored continuously. The                            Olympus CF-HQ190L ([id number]) Colonoscope was                            introduced through the anus and advanced to the the                            cecum, identified by appendiceal orifice and                            ileocecal valve. The ileocecal valve, appendiceal                            orifice, and rectum were photographed. The quality                            of the bowel preparation was excellent. The                            colonoscopy was performed without difficulty. The                            patient tolerated the procedure well. The bowel                            preparation used was SUPREP via split dose                            instruction. Scope In: 2:15:01 PM Scope Out: 2:28:56 PM Scope Withdrawal Time: 0 hours 11 minutes 19 seconds  Total Procedure Duration: 0 hours 13 minutes  55 seconds  Findings:                 A 3 mm polyp was found in the descending colon. The                            polyp was removed with a cold snare. Resection and                            retrieval were complete.                           Multiple diverticula were found in the left colon                            and right colon. There was a 10 cm segment of mild                            to moderate colitis in the sigmoid colon. Biopsies                            were taken with a cold forceps for histology. The                            mucosa above and below this segment was normal.                           The exam was otherwise without abnormality on                            direct and retroflexion views. Complications:            No immediate complications. Estimated blood loss:                            None. Estimated Blood Loss:     Estimated  blood loss: none. Impression:               - One 3 mm polyp in the descending colon, removed                            with a cold snare. Resected and retrieved.                           - Diverticulosis in the left colon and in the right                            colon.                           - Segmental colitis in the sigmoid colon. 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 Biopsied.                           -  The examination was otherwise normal on direct                            and retroflexion views. Recommendation:           - Repeat colonoscopy is not recommended for                            surveillance.                           - Patient has a contact number available for                            emergencies. The signs and symptoms of potential                            delayed complications were discussed with the                            patient. Return to normal activities tomorrow.                            Written discharge instructions were provided to the                            patient.                           - Resume previous diet.                           - Continue present medications.                           - Await pathology results. Docia Chuck. Henrene Pastor, MD 07/06/2021 2:37:40 PM This report has been signed electronically.

## 2021-07-06 NOTE — Patient Instructions (Signed)
Handout given:  polyps, diverticulosis Resume previous diet Continue current medications Await pathology results  YOU HAD AN ENDOSCOPIC PROCEDURE TODAY AT [facility]:   Refer to the procedure report that was given to you for any specific questions about what was found during the examination.  If the procedure report does not answer your questions, please call your gastroenterologist to clarify.  If you requested that your care partner not be given the details of your procedure findings, then the procedure report has been included in a sealed envelope for you to review at your convenience later.  YOU SHOULD EXPECT: Some feelings of bloating in the abdomen. Passage of more gas than usual.  Walking can help get rid of the air that was put into your GI tract during the procedure and reduce the bloating. If you had a lower endoscopy (such as a colonoscopy or flexible sigmoidoscopy) you may notice spotting of blood in your stool or on the toilet paper. If you underwent a bowel prep for your procedure, you may not have a normal bowel movement for a few days.  Please Note:  You might notice some irritation and congestion in your nose or some drainage.  This is from the oxygen used during your procedure.  There is no need for concern and it should clear up in a day or so.  SYMPTOMS TO REPORT IMMEDIATELY: Following lower endoscopy (colonoscopy or flexible sigmoidoscopy):  Excessive amounts of blood in the stool  Significant tenderness or worsening of abdominal pains  Swelling of the abdomen that is new, acute  Fever of 100F or higher  For urgent or emergent issues, a gastroenterologist can be reached at any hour by calling [phone]. Do not use MyChart messaging for urgent concerns.   DIET:  We do recommend a small meal at first, but then you may proceed to your regular diet.  Drink plenty of fluids but you should avoid alcoholic beverages for 24 hours.  ACTIVITY:  You should  plan to take it easy for the rest of today and you should NOT DRIVE or use heavy machinery until tomorrow (because of the sedation medicines used during the test).    FOLLOW UP: Our staff will call the number listed on your records 48-72 hours following your procedure to check on you and address any questions or concerns that you may have regarding the information given to you following your procedure. If we do not reach you, we will leave a message.  We will attempt to reach you two times.  During this call, we will ask if you have developed any symptoms of COVID 19. If you develop any symptoms (ie: fever, flu-like symptoms, shortness of breath, cough etc.) before then, please call [phone].  If you test positive for Covid 19 in the 2 weeks post procedure, please call and report this information to [place].    If any biopsies were taken you will be contacted by phone or by letter within the next 1-3 weeks.  Please call us at [phone] if you have not heard about the biopsies in 3 weeks.   SIGNATURES/CONFIDENTIALITY: You and/or your care partner have signed paperwork which will be entered into your electronic medical record.  These signatures attest to the fact that that the information above on your After Visit Summary has been reviewed and is understood.  Full responsibility of the confidentiality of this discharge information lies with you and/or your care-partner.

## 2021-07-06 NOTE — Progress Notes (Signed)
Pt's states no medical or surgical changes since previsit or office visit. 

## 2021-07-06 NOTE — Progress Notes (Signed)
HISTORY OF PRESENT ILLNESS:  Neil [name] is a 80 y.o. male who presents today for surveillance colonoscopy due to a history of multiple and advanced adenomatous colon polyps as well as a family history of colon cancer.  Previous examinations 2010, 2014, 2017.  No active GI complaints.  Chronic medical problems are stable  REVIEW OF SYSTEMS:  All non-GI ROS negative.  Past Medical History:  Diagnosis Date   ALLERGIC RHINITIS 12/04/2007   Arthritis    bilateral hands, shoulders, and knees   BENIGN PROSTATIC HYPERTROPHY 06/05/2007   BUNDLE BRANCH BLOCK, RIGHT 07/21/2009   BURSITIS, RIGHT SHOULDER 11/06/2010   Choledocholithiasis    CKD (chronic kidney disease) stage 3, GFR 30-59 ml/min (HCC) 08/02/2017   COLONIC POLYPS, HX OF 06/05/2007   tubular adenoma also 02/19/2013   Coronary artery calcification seen on CT scan 03/07/2016   DIABETES MELLITUS, TYPE [name] 12/04/2007   pt states not diabetic-no meds 02-05-13 PV   DIVERTICULOSIS, COLON 07/21/2009   ERECTILE DYSFUNCTION 06/05/2007   Gallstones    HYPERLIPIDEMIA 06/05/2007   on meds   HYPERTENSION 06/05/2007   on meds   Hypotension    INGUINAL HERNIA, RIGHT, SMALL 11/06/2010   Liver abscess 05/23/2015   SKIN LESION 06/03/2008    Past Surgical History:  Procedure Laterality Date   CHOLECYSTECTOMY N/A 03/02/2015   Procedure: LAPAROSCOPIC CHOLECYSTECTOMY;  Surgeon: Fanny Skates, MD;  Location: [facility];  Service: General;  Laterality: N/A;   COLONOSCOPY  2017   JP-MAC-suprep (good)-TA   ERCP N/A 03/01/2015   Procedure: ENDOSCOPIC RETROGRADE CHOLANGIOPANCREATOGRAPHY (ERCP);  Surgeon: Irene Shipper, MD;  Location: [facility];  Service: Endoscopy;  Laterality: N/A;  Dr Dalbert Batman wants patient to stay overnight after ERCP   ERCP N/A 05/03/2015   Procedure: ENDOSCOPIC RETROGRADE CHOLANGIOPANCREATOGRAPHY (ERCP);  Surgeon: Irene Shipper, MD;  Location: [facility];  Service: Endoscopy;  Laterality: N/A;   FINGER SURGERY Left     "little finger"   [place] Right 2014   8'14 repair   POLYPECTOMY     TONSILLECTOMY     WISDOM TOOTH EXTRACTION  1972    Social History Neil [name]  reports that he quit smoking about 14 years ago. His smoking use included cigarettes. He has never used smokeless tobacco. He reports that he does not drink alcohol and does not use drugs.  family history includes Brain cancer (age of onset: 60) in his mother; Colon cancer in his paternal uncle; Colon cancer (age of onset: 75) in his mother; Colon polyps in his paternal uncle; Colon polyps (age of onset: 76) in his mother.  Allergies  Allergen Reactions   Statins     REACTION: muscle aches   Zanaflex [Tizanidine] Other (See Comments)    Dizzy sleepy   Zetia [Ezetimibe] Other (See Comments)    Statins= Muscle and bone pain       PHYSICAL EXAMINATION:  Vital signs: BP (!) 141/62   Pulse 80   Temp 98 F (36.7 C)   Ht _0  (1.727 m)   Wt 173 lb (78.5 kg)   SpO2 97%   BMI 26.30 kg/m  General: Well-developed, well-nourished, no acute distress HEENT: Sclerae are anicteric, conjunctiva pink. Oral mucosa intact Lungs: Clear Heart: Regular Abdomen: soft, nontender, nondistended, no obvious ascites, no peritoneal signs, normal bowel sounds. No organomegaly. Extremities: No edema Psychiatric: alert and oriented x3. Cooperative     ASSESSMENT:  1.  History of  multiple adenomatous colon polyps and family history of colon cancer due for surveillance   PLAN:  1.  Surveillance colonoscopy

## 2021-07-06 NOTE — Progress Notes (Signed)
VS taken by DT 

## 2021-07-10 ENCOUNTER — Telehealth: Payer: Self-pay

## 2021-07-10 NOTE — Telephone Encounter (Signed)
  Follow up Call-  Call back number 07/06/2021  Post procedure Call Back phone  # [phone]  Permission to leave phone message Yes  Some recent data might be hidden     Patient questions:  Do you have a fever, pain , or abdominal swelling? No. Pain Score  0 *  Have you tolerated food without any problems? Yes.    Have you been able to return to your normal activities? Yes.    Do you have any questions about your discharge instructions: Diet   No. Medications  No. Follow up visit  No.  Do you have questions or concerns about your Care? No.  Actions: * If pain score is 4 or above: No action needed, pain <4.  Have you developed a fever since your procedure? no  2.   Have you had an respiratory symptoms (SOB or cough) since your procedure? no  3.   Have you tested positive for COVID 19 since your procedure no  4.   Have you had any family members/close contacts diagnosed with the COVID 19 since your procedure?  no   If yes to any of these questions please route to Joylene John, RN and Joella Prince, RN

## 2021-07-12 ENCOUNTER — Encounter: Payer: Self-pay | Admitting: Internal Medicine

## 2021-07-12 ENCOUNTER — Other Ambulatory Visit: Payer: Self-pay | Admitting: Internal Medicine

## 2021-07-13 ENCOUNTER — Encounter: Payer: Self-pay | Admitting: Internal Medicine

## 2021-07-14 ENCOUNTER — Encounter: Payer: Self-pay | Admitting: Internal Medicine

## 2021-07-18 MED ORDER — METFORMIN HCL ER 500 MG PO TB24: 500.0000 mg | ORAL_TABLET | Freq: Every day | ORAL | 3 refills | Status: DC

## 2021-07-19 MED ORDER — METFORMIN HCL ER 500 MG PO TB24: 500.0000 mg | ORAL_TABLET | Freq: Every day | ORAL | 3 refills | Status: DC

## 2021-07-19 NOTE — Addendum Note (Signed)
Addended by: Biagio Borg on: 07/19/2021 01:07 PM   Modules accepted: Orders

## 2021-07-22 ENCOUNTER — Other Ambulatory Visit: Payer: Self-pay | Admitting: Internal Medicine

## 2021-07-22 NOTE — Telephone Encounter (Signed)
Please refill as per office routine med refill policy (all routine meds to be refilled for 3 mo or monthly (per pt preference) up to one year from last visit, then month to month grace period for 3 mo, then further med refills will have to be denied) ? ?

## 2021-09-04 ENCOUNTER — Emergency Department (HOSPITAL_COMMUNITY): Payer: Medicare Other

## 2021-09-04 ENCOUNTER — Encounter (HOSPITAL_COMMUNITY): Payer: Self-pay | Admitting: Emergency Medicine

## 2021-09-04 ENCOUNTER — Other Ambulatory Visit: Payer: Self-pay

## 2021-09-04 ENCOUNTER — Inpatient Hospital Stay (HOSPITAL_COMMUNITY)
Admission: EM | Admit: 2021-09-04 | Discharge: 2021-09-11 | DRG: 377 | Disposition: A | Discharge status: Home Health | Payer: Medicare Other | Attending: Internal Medicine | Admitting: Internal Medicine

## 2021-09-04 DIAGNOSIS — R351 Nocturia: Secondary | ICD-10-CM | POA: Diagnosis not present

## 2021-09-04 DIAGNOSIS — N179 Acute kidney failure, unspecified: Secondary | ICD-10-CM | POA: Diagnosis not present

## 2021-09-04 DIAGNOSIS — R6889 Other general symptoms and signs: Secondary | ICD-10-CM | POA: Diagnosis not present

## 2021-09-04 DIAGNOSIS — D649 Anemia, unspecified: Secondary | ICD-10-CM | POA: Diagnosis present

## 2021-09-04 DIAGNOSIS — K5289 Other specified noninfective gastroenteritis and colitis: Secondary | ICD-10-CM | POA: Diagnosis not present

## 2021-09-04 DIAGNOSIS — R531 Weakness: Secondary | ICD-10-CM | POA: Diagnosis not present

## 2021-09-04 DIAGNOSIS — K5731 Diverticulosis of large intestine without perforation or abscess with bleeding: Secondary | ICD-10-CM | POA: Diagnosis not present

## 2021-09-04 DIAGNOSIS — K529 Noninfective gastroenteritis and colitis, unspecified: Secondary | ICD-10-CM | POA: Diagnosis not present

## 2021-09-04 DIAGNOSIS — Z87891 Personal history of nicotine dependence: Secondary | ICD-10-CM

## 2021-09-04 DIAGNOSIS — E785 Hyperlipidemia, unspecified: Secondary | ICD-10-CM | POA: Diagnosis present

## 2021-09-04 DIAGNOSIS — E860 Dehydration: Secondary | ICD-10-CM | POA: Diagnosis not present

## 2021-09-04 DIAGNOSIS — N1330 Unspecified hydronephrosis: Secondary | ICD-10-CM | POA: Diagnosis not present

## 2021-09-04 DIAGNOSIS — Z79899 Other long term (current) drug therapy: Secondary | ICD-10-CM

## 2021-09-04 DIAGNOSIS — R739 Hyperglycemia, unspecified: Secondary | ICD-10-CM | POA: Diagnosis not present

## 2021-09-04 DIAGNOSIS — N320 Bladder-neck obstruction: Secondary | ICD-10-CM | POA: Diagnosis present

## 2021-09-04 DIAGNOSIS — R338 Other retention of urine: Secondary | ICD-10-CM

## 2021-09-04 DIAGNOSIS — K625 Hemorrhage of anus and rectum: Secondary | ICD-10-CM | POA: Diagnosis not present

## 2021-09-04 DIAGNOSIS — K6289 Other specified diseases of anus and rectum: Secondary | ICD-10-CM | POA: Diagnosis not present

## 2021-09-04 DIAGNOSIS — N1832 Chronic kidney disease, stage 3b: Secondary | ICD-10-CM | POA: Diagnosis present

## 2021-09-04 DIAGNOSIS — Z20822 Contact with and (suspected) exposure to covid-19: Secondary | ICD-10-CM | POA: Diagnosis not present

## 2021-09-04 DIAGNOSIS — Z7984 Long term (current) use of oral hypoglycemic drugs: Secondary | ICD-10-CM | POA: Diagnosis not present

## 2021-09-04 DIAGNOSIS — K922 Gastrointestinal hemorrhage, unspecified: Secondary | ICD-10-CM

## 2021-09-04 DIAGNOSIS — R197 Diarrhea, unspecified: Secondary | ICD-10-CM | POA: Diagnosis not present

## 2021-09-04 DIAGNOSIS — Z743 Need for continuous supervision: Secondary | ICD-10-CM | POA: Diagnosis not present

## 2021-09-04 DIAGNOSIS — E1165 Type 2 diabetes mellitus with hyperglycemia: Secondary | ICD-10-CM | POA: Diagnosis present

## 2021-09-04 DIAGNOSIS — Z86010 Personal history of colonic polyps: Secondary | ICD-10-CM | POA: Diagnosis not present

## 2021-09-04 DIAGNOSIS — I4891 Unspecified atrial fibrillation: Secondary | ICD-10-CM | POA: Diagnosis present

## 2021-09-04 DIAGNOSIS — E119 Type 2 diabetes mellitus without complications: Secondary | ICD-10-CM | POA: Diagnosis not present

## 2021-09-04 DIAGNOSIS — F05 Delirium due to known physiological condition: Secondary | ICD-10-CM | POA: Diagnosis present

## 2021-09-04 DIAGNOSIS — N400 Enlarged prostate without lower urinary tract symptoms: Secondary | ICD-10-CM | POA: Diagnosis present

## 2021-09-04 DIAGNOSIS — E876 Hypokalemia: Secondary | ICD-10-CM | POA: Diagnosis not present

## 2021-09-04 DIAGNOSIS — R339 Retention of urine, unspecified: Secondary | ICD-10-CM | POA: Diagnosis not present

## 2021-09-04 DIAGNOSIS — Z8719 Personal history of other diseases of the digestive system: Secondary | ICD-10-CM | POA: Diagnosis not present

## 2021-09-04 DIAGNOSIS — E1122 Type 2 diabetes mellitus with diabetic chronic kidney disease: Secondary | ICD-10-CM | POA: Diagnosis not present

## 2021-09-04 DIAGNOSIS — Z6826 Body mass index (BMI) 26.0-26.9, adult: Secondary | ICD-10-CM | POA: Diagnosis not present

## 2021-09-04 DIAGNOSIS — N401 Enlarged prostate with lower urinary tract symptoms: Secondary | ICD-10-CM | POA: Diagnosis present

## 2021-09-04 DIAGNOSIS — E43 Unspecified severe protein-calorie malnutrition: Secondary | ICD-10-CM | POA: Insufficient documentation

## 2021-09-04 DIAGNOSIS — K6389 Other specified diseases of intestine: Secondary | ICD-10-CM | POA: Diagnosis not present

## 2021-09-04 DIAGNOSIS — R300 Dysuria: Secondary | ICD-10-CM | POA: Diagnosis not present

## 2021-09-04 DIAGNOSIS — I10 Essential (primary) hypertension: Secondary | ICD-10-CM | POA: Diagnosis present

## 2021-09-04 DIAGNOSIS — K648 Other hemorrhoids: Secondary | ICD-10-CM | POA: Diagnosis present

## 2021-09-04 DIAGNOSIS — I129 Hypertensive chronic kidney disease with stage 1 through stage 4 chronic kidney disease, or unspecified chronic kidney disease: Secondary | ICD-10-CM | POA: Diagnosis present

## 2021-09-04 DIAGNOSIS — R935 Abnormal findings on diagnostic imaging of other abdominal regions, including retroperitoneum: Secondary | ICD-10-CM

## 2021-09-04 DIAGNOSIS — R350 Frequency of micturition: Secondary | ICD-10-CM | POA: Diagnosis not present

## 2021-09-04 DIAGNOSIS — Z7982 Long term (current) use of aspirin: Secondary | ICD-10-CM

## 2021-09-04 LAB — CBC WITH DIFFERENTIAL/PLATELET
Abs Immature Granulocytes: 0.02 10*3/uL (ref 0.00–0.07)
Basophils Absolute: 0 10*3/uL (ref 0.0–0.1)
Basophils Relative: 0 %
Eosinophils Absolute: 0.1 10*3/uL (ref 0.0–0.5)
Eosinophils Relative: 1 %
HCT: 43.4 % (ref 39.0–52.0)
Hemoglobin: 14.2 g/dL (ref 13.0–17.0)
Immature Granulocytes: 0 %
Lymphocytes Relative: 11 %
Lymphs Abs: 0.8 10*3/uL (ref 0.7–4.0)
MCH: 28.5 pg (ref 26.0–34.0)
MCHC: 32.7 g/dL (ref 30.0–36.0)
MCV: 87 fL (ref 80.0–100.0)
Monocytes Absolute: 0.8 10*3/uL (ref 0.1–1.0)
Monocytes Relative: 11 %
Neutro Abs: 5.6 10*3/uL (ref 1.7–7.7)
Neutrophils Relative %: 77 %
Platelets: 202 10*3/uL (ref 150–400)
RBC: 4.99 MIL/uL (ref 4.22–5.81)
RDW: 13.8 % (ref 11.5–15.5)
WBC: 7.3 10*3/uL (ref 4.0–10.5)
nRBC: 0 % (ref 0.0–0.2)

## 2021-09-04 LAB — COMPREHENSIVE METABOLIC PANEL
ALT: 22 U/L (ref 0–44)
AST: 14 U/L — ABNORMAL LOW (ref 15–41)
Albumin: 3.6 g/dL (ref 3.5–5.0)
Alkaline Phosphatase: 40 U/L (ref 38–126)
Anion gap: 11 (ref 5–15)
BUN: 58 mg/dL — ABNORMAL HIGH (ref 8–23)
CO2: 26 mmol/L (ref 22–32)
Calcium: 9.9 mg/dL (ref 8.9–10.3)
Chloride: 103 mmol/L (ref 98–111)
Creatinine, Ser: 2.44 mg/dL — ABNORMAL HIGH (ref 0.61–1.24)
GFR, Estimated: 26 mL/min — ABNORMAL LOW (ref >60)
Glucose, Bld: 254 mg/dL — ABNORMAL HIGH (ref 70–99)
Potassium: 3.6 mmol/L (ref 3.5–5.1)
Sodium: 140 mmol/L (ref 135–145)
Total Bilirubin: 0.8 mg/dL (ref 0.3–1.2)
Total Protein: 7.8 g/dL (ref 6.5–8.1)

## 2021-09-04 LAB — URINALYSIS, ROUTINE W REFLEX MICROSCOPIC
Bilirubin Urine: NEGATIVE
Glucose, UA: >=500 mg/dL — AB
Ketones, ur: NEGATIVE mg/dL
Leukocytes,Ua: NEGATIVE
Nitrite: NEGATIVE
Protein, ur: 30 mg/dL — AB
Specific Gravity, Urine: 1.009 (ref 1.005–1.030)
pH: 5 (ref 5.0–8.0)

## 2021-09-04 LAB — RESP PANEL BY RT-PCR (FLU A&B, COVID) ARPGX2
Influenza A by PCR: NEGATIVE
Influenza B by PCR: NEGATIVE
SARS Coronavirus 2 by RT PCR: NEGATIVE

## 2021-09-04 MED ORDER — ACETAMINOPHEN 650 MG RE SUPP: 650.0000 mg | Freq: Four times a day (QID) | RECTAL | Status: DC | PRN

## 2021-09-04 MED ORDER — METOPROLOL SUCCINATE ER 50 MG PO TB24
50.0000 mg | ORAL_TABLET | Freq: Every day | ORAL | Status: DC
  Administered 2021-09-05 – 2021-09-11 (×7): 50 mg via ORAL
  Filled 2021-09-04 (×7): qty 1

## 2021-09-04 MED ORDER — HEPARIN SODIUM (PORCINE) 5000 UNIT/ML IJ SOLN
5000.0000 [IU] | Freq: Three times a day (TID) | INTRAMUSCULAR | Status: DC
  Administered 2021-09-05 – 2021-09-06 (×3): 5000 [IU] via SUBCUTANEOUS
  Filled 2021-09-04 (×4): qty 1

## 2021-09-04 MED ORDER — INSULIN ASPART 100 UNIT/ML IJ SOLN
0.0000 [IU] | Freq: Three times a day (TID) | INTRAMUSCULAR | Status: DC
  Administered 2021-09-05: 1 [IU] via SUBCUTANEOUS
  Administered 2021-09-05: 2 [IU] via SUBCUTANEOUS
  Administered 2021-09-06: 3 [IU] via SUBCUTANEOUS
  Administered 2021-09-06: 2 [IU] via SUBCUTANEOUS
  Administered 2021-09-07: 5 [IU] via SUBCUTANEOUS
  Administered 2021-09-07 – 2021-09-08 (×4): 2 [IU] via SUBCUTANEOUS
  Administered 2021-09-08 – 2021-09-09 (×2): 3 [IU] via SUBCUTANEOUS
  Administered 2021-09-09: 1 [IU] via SUBCUTANEOUS
  Administered 2021-09-09: 7 [IU] via SUBCUTANEOUS
  Administered 2021-09-10 (×2): 2 [IU] via SUBCUTANEOUS
  Administered 2021-09-11: 12:00:00 5 [IU] via SUBCUTANEOUS
  Administered 2021-09-11 (×2): 2 [IU] via SUBCUTANEOUS

## 2021-09-04 MED ORDER — HYDRALAZINE HCL 20 MG/ML IJ SOLN
5.0000 mg | INTRAMUSCULAR | Status: DC | PRN
  Administered 2021-09-04: 5 mg via INTRAVENOUS
  Filled 2021-09-04: qty 1

## 2021-09-04 MED ORDER — TERAZOSIN HCL 2 MG PO CAPS
2.0000 mg | ORAL_CAPSULE | Freq: Every day | ORAL | Status: DC
  Administered 2021-09-05 – 2021-09-11 (×7): 2 mg via ORAL
  Filled 2021-09-04 (×7): qty 1

## 2021-09-04 MED ORDER — ACETAMINOPHEN 325 MG PO TABS
650.0000 mg | ORAL_TABLET | Freq: Four times a day (QID) | ORAL | Status: DC | PRN
  Administered 2021-09-11: 16:00:00 650 mg via ORAL
  Filled 2021-09-04: qty 2

## 2021-09-04 MED ORDER — SODIUM CHLORIDE 0.9 % IV BOLUS
1000.0000 mL | Freq: Once | INTRAVENOUS | Status: AC
  Administered 2021-09-04: 1000 mL via INTRAVENOUS

## 2021-09-04 MED ORDER — LACTATED RINGERS IV SOLN: INTRAVENOUS | Status: AC

## 2021-09-04 NOTE — ED Notes (Signed)
Pt brought to bed 23 in a gurney eating a sandwich. NAD. A/ox4, pt c/o not being able to urinate x 1 week with only dribbling. Also c/o diffuse 4/10 ABD pain with diarrhea. ABD soft, tender througout. Denies n/v or fever.

## 2021-09-04 NOTE — ED Notes (Signed)
Pt was bladder scanned. >913ml. MD notified.

## 2021-09-04 NOTE — ED Provider Notes (Signed)
[facility] DEPT Provider Note   CSN: [id number] Arrival date & time: 09/04/21  1640     History Chief Complaint  Patient presents with   Altered Mental Status   Dysuria    Neil [name] is a 80 y.o. male.  80 year old male presents via EMS which is activated by his brother for altered mental status.  Patient on exam is alert and oriented x4 and states for the past week he has been having diarrhea and urinary retention.  He denies fever, chills, cough, shortness of breath, chest pain.  Over the past day or 2 he also endorses weakness.  Reports last time he urinated normally was last Monday.  He reports since Monday he has been having diarrhea and has not had much p.o. intake.   Discussion had with brother who later presented at bedside.  He reports patient's friend called him to let him know his brother was not acting like himself.  Patient after multiple attempts did get a hold of his brother who reported to him he had not been feeling well and has not been able to get around the house due to weakness.  Patient called EMS because he was 2 hours away from the house.   The history is provided by the patient. No language interpreter was used.      Past Medical History:  Diagnosis Date   ALLERGIC RHINITIS 12/04/2007   Arthritis    bilateral hands, shoulders, and knees   BENIGN PROSTATIC HYPERTROPHY 06/05/2007   BUNDLE BRANCH BLOCK, RIGHT 07/21/2009   BURSITIS, RIGHT SHOULDER 11/06/2010   Choledocholithiasis    CKD (chronic kidney disease) stage 3, GFR 30-59 ml/min ([place]) 08/02/2017   COLONIC POLYPS, HX OF 06/05/2007   tubular adenoma also 02/19/2013   Coronary artery calcification seen on CT scan 03/07/2016   DIABETES MELLITUS, TYPE [name] 12/04/2007   pt states not diabetic-no meds 02-05-13 PV   DIVERTICULOSIS, COLON 07/21/2009   ERECTILE DYSFUNCTION 06/05/2007   Gallstones    HYPERLIPIDEMIA 06/05/2007   on meds   HYPERTENSION 06/05/2007   on  meds   Hypotension    INGUINAL HERNIA, RIGHT, SMALL 11/06/2010   Liver abscess 05/23/2015   SKIN LESION 06/03/2008    Patient Active Problem List   Diagnosis Date Noted   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   Low back pain 11/27/2020   Dyspnea on exertion 05/24/2020   Tremor 11/25/2019   Vitamin D deficiency 11/25/2019   Depression 05/28/2019   Bursitis 05/28/2018   CKD (chronic kidney disease) stage 3, GFR 30-59 ml/min ([facility]) 08/02/2017   Increased prostate specific antigen (PSA) velocity 08/02/2017   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   Coronary artery calcification seen on CT scan 03/07/2016   Eosinophilia 12/30/2015   Hepatic abscess    Streptococcal infection    Loss of appetite    Normocytic anemia 05/24/2015   Malnutrition of moderate degree ([place]) 05/24/2015   Essential hypertension, benign    Pyogenic hepatic abscess    Gall stones, common bile duct    Calculus of bile duct with obstruction and without cholangitis or cholecystitis    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   Calculus of bile duct with acute cholangitis with obstruction    Gall stones 02/13/2015   Abnormal urine 02/08/2015   Skin lesion of back 12/12/2012   Hypertriglyceridemia 11/14/2011   Encounter for well adult exam with abnormal findings 11/11/2011   INGUINAL HERNIA, RIGHT, SMALL 11/06/2010   Disorder of bursae  and tendons in shoulder region 11/06/2010   BUNDLE BRANCH BLOCK, RIGHT 07/21/2009   DIVERTICULOSIS, COLON 07/21/2009   Diabetes ([place]) 12/04/2007   ALLERGIC RHINITIS 12/04/2007   Hyperlipidemia 06/05/2007   ERECTILE DYSFUNCTION 06/05/2007   BENIGN PROSTATIC HYPERTROPHY 06/05/2007   COLONIC POLYPS, HX OF 06/05/2007    Past Surgical History:  Procedure Laterality Date   CHOLECYSTECTOMY N/A 03/02/2015   Procedure: LAPAROSCOPIC CHOLECYSTECTOMY;  Surgeon: Fanny Skates, MD;  Location: [facility];  Service: General;  Laterality: N/A;   COLONOSCOPY  2017    JP-MAC-suprep (good)-TA   ERCP N/A 03/01/2015   Procedure: ENDOSCOPIC RETROGRADE CHOLANGIOPANCREATOGRAPHY (ERCP);  Surgeon: Irene Shipper, MD;  Location: [facility];  Service: Endoscopy;  Laterality: N/A;  Dr Dalbert Batman wants patient to stay overnight after ERCP   ERCP N/A 05/03/2015   Procedure: ENDOSCOPIC RETROGRADE CHOLANGIOPANCREATOGRAPHY (ERCP);  Surgeon: Irene Shipper, MD;  Location: [facility];  Service: Endoscopy;  Laterality: N/A;   FINGER SURGERY Left    "little finger"   [place]   INGUINAL HERNIA REPAIR Right 2014   8'14 repair   POLYPECTOMY     TONSILLECTOMY     WISDOM TOOTH EXTRACTION  1972       Family History  Problem Relation Age of Onset   Colon polyps Mother 44   Colon cancer Mother 69   Brain cancer Mother 52       mets from colon cancer   Colon cancer Paternal Uncle    Colon polyps Paternal Uncle    Rectal cancer Neg Hx    Stomach cancer Neg Hx    Esophageal cancer Neg Hx     Social History   Tobacco Use   Smoking status: Former    Types: Cigarettes    Quit date: 2008    Years since quitting: 14.8   Smokeless tobacco: Never  Vaping Use   Vaping Use: Never used  Substance Use Topics   Alcohol use: No    Alcohol/week: 0.0 standard drinks   Drug use: No    Home Medications Prior to Admission medications   Medication Sig Start Date End Date Taking? Authorizing Provider  acetaminophen (TYLENOL) 500 MG tablet Take 1,500 mg by mouth every 6 (six) hours as needed.    [provider]  Alpha-D-Galactosidase (BEANO PO) Take 1 tablet by mouth daily as needed.    [provider]  aspirin 81 MG EC tablet Take 81 mg by mouth daily. 10/15/08   [provider]  benazepril (LOTENSIN) 40 MG tablet TAKE 1 TABLET BY MOUTH  DAILY 07/25/21   Biagio Borg, MD  cholecalciferol (VITAMIN D) 1000 units tablet Take 2,000 Units by mouth daily.    [provider]  citalopram (CELEXA) 10 MG tablet TAKE 1 TABLET BY MOUTH   DAILY 07/12/21   Biagio Borg, MD  Cyanocobalamin (B-12) 1000 MCG TABS Take 1 tablet by mouth daily at 6 (six) AM. 01/25/19   [provider]  cyclobenzaprine (FLEXERIL) 5 MG tablet TAKE 1 TABLET BY MOUTH 3  TIMES DAILY AS NEEDED FOR  MUSCLE SPASM(S) 05/09/21   Biagio Borg, MD  fenofibrate 160 MG tablet TAKE 1 TABLET BY MOUTH AT  BEDTIME 07/25/21   Biagio Borg, MD  Ibuprofen (IBU-200 PO) Take 1 tablet by mouth daily at 6 (six) AM. 05/13/21   [provider]  metFORMIN (GLUCOPHAGE-XR) 500 MG 24 hr tablet Take 1 tablet (500 mg total) by mouth daily with breakfast. 07/19/21  Biagio Borg, MD  metoprolol succinate (TOPROL-XL) 50 MG 24 hr tablet Take 1 tablet (50 mg total) by mouth daily. Take with or immediately following a meal. 11/24/20   Biagio Borg, MD  sodium chloride (OCEAN) 0.65 % nasal spray Place 1 spray into the nose daily as needed for congestion.     [provider]  terazosin (HYTRIN) 2 MG capsule TAKE 1 CAPSULE BY MOUTH  DAILY 07/25/21   Biagio Borg, MD    Allergies    Statins, Zanaflex [tizanidine], and Zetia [ezetimibe]  Review of Systems   Review of Systems  Constitutional:  Negative for activity change, chills and fever.  Respiratory:  Negative for cough and shortness of breath.   Cardiovascular:  Negative for chest pain.  Gastrointestinal:  Positive for abdominal pain and diarrhea. Negative for nausea and vomiting.  Genitourinary:  Positive for decreased urine volume and difficulty urinating. Negative for dysuria.  Neurological:  Positive for weakness. Negative for light-headedness and headaches.  All other systems reviewed and are negative.  Physical Exam Updated Vital Signs BP (!) 182/86   Pulse 90   Temp (!) 97.5 F (36.4 C) (Oral)   Resp 18   Ht _0  (1.727 m)   Wt 79 kg   SpO2 96%   BMI 26.48 kg/m   Physical Exam Vitals and nursing note reviewed.  Constitutional:      General: He is not in acute distress.    Appearance:  Normal appearance. He is not ill-appearing.  HENT:     Head: Normocephalic and atraumatic.     Nose: Nose normal.  Eyes:     General: No scleral icterus.    Extraocular Movements: Extraocular movements intact.     Conjunctiva/sclera: Conjunctivae normal.  Cardiovascular:     Rate and Rhythm: Normal rate and regular rhythm.     Pulses: Normal pulses.     Heart sounds: Normal heart sounds.  Pulmonary:     Effort: Pulmonary effort is normal. No respiratory distress.     Breath sounds: Normal breath sounds. No wheezing or rales.  Abdominal:     General: There is distension.     Tenderness: There is abdominal tenderness. There is guarding.  Musculoskeletal:        General: Normal range of motion.     Cervical back: Normal range of motion.  Skin:    General: Skin is warm and dry.  Neurological:     General: No focal deficit present.     Mental Status: He is alert. Mental status is at baseline.    ED Results / Procedures / Treatments   Labs (all labs ordered are listed, but only abnormal results are displayed) Labs Reviewed - No data to display  EKG None  Radiology No results found.  Procedures Procedures   Medications Ordered in ED Medications - No data to display  ED Course  I have reviewed the triage vital signs and the nursing notes.  Pertinent labs & imaging results that were available during my care of the patient were reviewed by me and considered in my medical decision making (see chart for details).    MDM Rules/Calculators/A&P                           80 year old male presents for evaluation of altered mental status and urinary complaints.  Patient reports urinary retention x1 week.  He has significant abdominal bladder distention on exam.  Patient  had Foley catheter inserted with output of 1.6 L.  Following catheterization patient remains with tenderness palpation of his abdomen as well as guarding.  We will proceed with CT abdomen pelvis without contrast.   Initial work-up is significant for CBC without leukocytosis, AKI with creatinine of 2.44 likely secondary to urinary retention and GI loss secondary to diarrhea.  CT abdomen pelvis with enlarged prostate but without other acute abdominal process.  Will provide patient with 1 L bolus fluid and discussed with hospitalist for admission secondary to weakness and dehydration.  Case discussed with hospitalist will evaluate patient for admission.   Final Clinical Impression(s) / ED Diagnoses Final diagnoses:  None    Rx / DC Orders ED Discharge Orders     None        Evlyn Courier, PA-C 09/04/21 2022    Malvin Johns, MD 09/04/21 2025

## 2021-09-04 NOTE — ED Triage Notes (Signed)
Pt BIB EMS, brother called in. Said pt wasn't acting right. EMS stated pt has been unable to control his bladder. Strong urine smell, c/o pain with urination. Pt A&O x4, very weak. BP 182/84 CBG reported 315.

## 2021-09-04 NOTE — ED Notes (Signed)
Patient transported to CT 

## 2021-09-04 NOTE — ED Notes (Signed)
Patient returned from CT

## 2021-09-04 NOTE — ED Notes (Signed)
Pt denies a-fib HX, appears irregular on monitor. Provider made aware, EKG captured

## 2021-09-04 NOTE — H&P (Signed)
History and Physical    Neil [name] [name] LFY:[id number] DOB: [birth year] DOA: 09/04/2021  PCP: Biagio Borg, MD  Patient coming from: Home.  Chief Complaint: Abdominal pain difficulty urinating and diarrhea.  HPI: Neil [name] is a 80 y.o. male with history of diabetes mellitus type 2 last hemoglobin A1c was 7.4 in August of this year, hypertension, hyperlipidemia presents to the ER because of weakness and patient's family found that he was confused and has been having persistent diarrhea for the last 1 week which is nonbloody and watery and unable to urinate for the last 1 week.  Denies nausea vomiting or fever chills.  Denies using any antibiotics recently.  ED Course: In the ER patient abdomen is distended and CT scan shows bladder outlet obstruction with large prostate.  Bilateral mild hydronephrosis and perinephric and stranding around the bladder.  Also shows sigmoid wall thickening concerning for colitis.  Patient's labs show acute renal failure with creatinine worsening from 1.3 it is around 2.4 now with blood glucose of 254.  Patient had Foley catheter placed following which patient abdominal pain has largely improved but is still generally weak and requiring to patient assist to ambulate.  COVID test was negative.  Review of Systems: As per HPI, rest all negative.   Past Medical History:  Diagnosis Date   ALLERGIC RHINITIS 12/04/2007   Arthritis    bilateral hands, shoulders, and knees   BENIGN PROSTATIC HYPERTROPHY 06/05/2007   BUNDLE BRANCH BLOCK, RIGHT 07/21/2009   BURSITIS, RIGHT SHOULDER 11/06/2010   Choledocholithiasis    CKD (chronic kidney disease) stage 3, GFR 30-59 ml/min (HCC) 08/02/2017   COLONIC POLYPS, HX OF 06/05/2007   tubular adenoma also 02/19/2013   Coronary artery calcification seen on CT scan 03/07/2016   DIABETES MELLITUS, TYPE [name] 12/04/2007   pt states not diabetic-no meds 02-05-13 PV   DIVERTICULOSIS, COLON 07/21/2009   ERECTILE DYSFUNCTION  06/05/2007   Gallstones    HYPERLIPIDEMIA 06/05/2007   on meds   HYPERTENSION 06/05/2007   on meds   Hypotension    INGUINAL HERNIA, RIGHT, SMALL 11/06/2010   Liver abscess 05/23/2015   SKIN LESION 06/03/2008    Past Surgical History:  Procedure Laterality Date   CHOLECYSTECTOMY N/A 03/02/2015   Procedure: LAPAROSCOPIC CHOLECYSTECTOMY;  Surgeon: Fanny Skates, MD;  Location: [facility];  Service: General;  Laterality: N/A;   COLONOSCOPY  2017   JP-MAC-suprep (good)-TA   ERCP N/A 03/01/2015   Procedure: ENDOSCOPIC RETROGRADE CHOLANGIOPANCREATOGRAPHY (ERCP);  Surgeon: Irene Shipper, MD;  Location: [facility];  Service: Endoscopy;  Laterality: N/A;  Dr Dalbert Batman wants patient to stay overnight after ERCP   ERCP N/A 05/03/2015   Procedure: ENDOSCOPIC RETROGRADE CHOLANGIOPANCREATOGRAPHY (ERCP);  Surgeon: Irene Shipper, MD;  Location: [facility];  Service: Endoscopy;  Laterality: N/A;   FINGER SURGERY Left    "little finger"   Isabel Right 2014   8'14 repair   POLYPECTOMY     TONSILLECTOMY     WISDOM TOOTH EXTRACTION  1972     reports that he quit smoking about 14 years ago. His smoking use included cigarettes. He has never used smokeless tobacco. He reports that he does not drink alcohol and does not use drugs.  Allergies  Allergen Reactions   Statins Other (See Comments)     muscle aches   Zanaflex [Tizanidine] Other (See Comments)    Dizzy, sleepy   Zetia [Ezetimibe] Other (See Comments)  Statins= Muscle and bone pain    Family History  Problem Relation Age of Onset   Colon polyps Mother 71   Colon cancer Mother 40   Brain cancer Mother 81       mets from colon cancer   Colon cancer Paternal Uncle    Colon polyps Paternal Uncle    Rectal cancer Neg Hx    Stomach cancer Neg Hx    Esophageal cancer Neg Hx     Prior to Admission medications   Medication Sig Start Date End Date Taking? Authorizing Provider  acetaminophen  (TYLENOL) 500 MG tablet Take 1,500 mg by mouth every 6 (six) hours as needed.    [provider]  Alpha-D-Galactosidase (BEANO PO) Take 1 tablet by mouth daily as needed.    [provider]  aspirin 81 MG EC tablet Take 81 mg by mouth daily. 10/15/08   [provider]  benazepril (LOTENSIN) 40 MG tablet TAKE 1 TABLET BY MOUTH  DAILY 07/25/21   Biagio Borg, MD  cholecalciferol (VITAMIN D) 1000 units tablet Take 2,000 Units by mouth daily.    [provider]  citalopram (CELEXA) 10 MG tablet TAKE 1 TABLET BY MOUTH  DAILY 07/12/21   Biagio Borg, MD  Cyanocobalamin (B-12) 1000 MCG TABS Take 1 tablet by mouth daily at 6 (six) AM. 01/25/19   [provider]  cyclobenzaprine (FLEXERIL) 5 MG tablet TAKE 1 TABLET BY MOUTH 3  TIMES DAILY AS NEEDED FOR  MUSCLE SPASM(S) 05/09/21   Biagio Borg, MD  fenofibrate 160 MG tablet TAKE 1 TABLET BY MOUTH AT  BEDTIME 07/25/21   Biagio Borg, MD  Ibuprofen (IBU-200 PO) Take 1 tablet by mouth daily at 6 (six) AM. 05/13/21   [provider]  metFORMIN (GLUCOPHAGE-XR) 500 MG 24 hr tablet Take 1 tablet (500 mg total) by mouth daily with breakfast. 07/19/21   Biagio Borg, MD  metoprolol succinate (TOPROL-XL) 50 MG 24 hr tablet Take 1 tablet (50 mg total) by mouth daily. Take with or immediately following a meal. 11/24/20   Biagio Borg, MD  sodium chloride (OCEAN) 0.65 % nasal spray Place 1 spray into the nose daily as needed for congestion.     [provider]  terazosin (HYTRIN) 2 MG capsule TAKE 1 CAPSULE BY MOUTH  DAILY 07/25/21   Biagio Borg, MD    Physical Exam: Constitutional: Moderately built and nourished. Vitals:   09/04/21 1930 09/04/21 2000 09/04/21 2100 09/04/21 2201  BP: (!) 177/93 (!) 190/96 (!) 172/77 (!) 161/79  Pulse: 87 87 96 97  Resp: 18 18 (!) 24 18  Temp:   98.5 F (36.9 C) 98.3 F (36.8 C)  TempSrc:   Oral Oral  SpO2: 99% 98% 98% 100%  Weight:    69.7 kg  Height:    _0   (1.727 m)   Eyes: Anicteric no pallor. ENMT: No discharge from the ears eyes nose and mouth. Neck: No mass felt.  No neck rigidity. Respiratory: No rhonchi or crepitations. Cardiovascular: S1-S2 heard. Abdomen: Soft nontender bowel sound present. Musculoskeletal: No edema. Skin: No rash. Neurologic: Alert awake oriented to time place and person.  Moves all extremities. Psychiatric: Appears normal.  Normal affect.   Labs on Admission: I have personally reviewed following labs and imaging studies  CBC: Recent Labs  Lab 09/04/21 1734  WBC 7.3  NEUTROABS 5.6  HGB 14.2  HCT 43.4  MCV 87.0  PLT 202  Basic Metabolic Panel: Recent Labs  Lab 09/04/21 1734  NA 140  K 3.6  CL 103  CO2 26  GLUCOSE 254*  BUN 58*  CREATININE 2.44*  CALCIUM 9.9   GFR: Estimated Creatinine Clearance: 23.8 mL/min (A) (by C-G formula based on SCr of 2.44 mg/dL (H)). Liver Function Tests: Recent Labs  Lab 09/04/21 1734  AST 14*  ALT 22  ALKPHOS 40  BILITOT 0.8  PROT 7.8  ALBUMIN 3.6   No results for input(s): LIPASE, AMYLASE in the last 168 hours. No results for input(s): AMMONIA in the last 168 hours. Coagulation Profile: No results for input(s): INR, PROTIME in the last 168 hours. Cardiac Enzymes: No results for input(s): CKTOTAL, CKMB, CKMBINDEX, TROPONINI in the last 168 hours. BNP (last 3 results) No results for input(s): PROBNP in the last 8760 hours. HbA1C: No results for input(s): HGBA1C in the last 72 hours. CBG: No results for input(s): GLUCAP in the last 168 hours. Lipid Profile: No results for input(s): CHOL, HDL, LDLCALC, TRIG, CHOLHDL, LDLDIRECT in the last 72 hours. Thyroid Function Tests: No results for input(s): TSH, T4TOTAL, FREET4, T3FREE, THYROIDAB in the last 72 hours. Anemia Panel: No results for input(s): VITAMINB12, FOLATE, FERRITIN, TIBC, IRON, RETICCTPCT in the last 72 hours. Urine analysis:    Component Value Date/Time   COLORURINE YELLOW 09/04/2021  1732   APPEARANCEUR CLEAR 09/04/2021 1732   LABSPEC 1.009 09/04/2021 1732   PHURINE 5.0 09/04/2021 1732   GLUCOSEU >=500 (A) 09/04/2021 1732   GLUCOSEU 100 (A) 11/20/2019 1100   HGBUR MODERATE (A) 09/04/2021 1732   BILIRUBINUR NEGATIVE 09/04/2021 1732   KETONESUR NEGATIVE 09/04/2021 1732   PROTEINUR 30 (A) 09/04/2021 1732   UROBILINOGEN 0.2 11/20/2019 1100   NITRITE NEGATIVE 09/04/2021 1732   LEUKOCYTESUR NEGATIVE 09/04/2021 1732   Sepsis Labs: _0 (procalcitonin:4,lacticidven:4) ) Recent Results (from the past 240 hour(s))  Resp Panel by RT-PCR (Flu A&B, Covid) Nasopharyngeal Swab     Status: None   Collection Time: 09/04/21  9:15 PM   Specimen: Nasopharyngeal Swab; Nasopharyngeal(NP) swabs in vial transport medium  Result Value Ref Range Status   SARS Coronavirus 2 by RT PCR NEGATIVE NEGATIVE Final    Comment: (NOTE) SARS-CoV-2 target nucleic acids are NOT DETECTED.  The SARS-CoV-2 RNA is generally detectable in upper respiratory specimens during the acute phase of infection. The lowest concentration of SARS-CoV-2 viral copies this assay can detect is 138 copies/mL. A negative result does not preclude SARS-Cov-2 infection and should not be used as the sole basis for treatment or other patient management decisions. A negative result may occur with  improper specimen collection/handling, submission of specimen other than nasopharyngeal swab, presence of viral mutation(s) within the areas targeted by this assay, and inadequate number of viral copies(<138 copies/mL). A negative result must be combined with clinical observations, patient history, and epidemiological information. The expected result is Negative.  Fact Sheet for Patients:  EntrepreneurPulse.com.au  Fact Sheet for Healthcare Providers:  IncredibleEmployment.be  This test is no t yet approved or cleared by the [place] FDA and  has been authorized for detection  and/or diagnosis of SARS-CoV-2 by FDA under an Emergency Use Authorization (EUA). This EUA will remain  in effect (meaning this test can be used) for the duration of the COVID-19 declaration under Section 564(b)(1) of the Act, 21 U.S.C.section 360bbb-3(b)(1), unless the authorization is terminated  or revoked sooner.       Influenza A by PCR NEGATIVE NEGATIVE Final   Influenza B by  PCR NEGATIVE NEGATIVE Final    Comment: (NOTE) The Xpert Xpress SARS-CoV-2/FLU/RSV plus assay is intended as an aid in the diagnosis of influenza from Nasopharyngeal swab specimens and should not be used as a sole basis for treatment. Nasal washings and aspirates are unacceptable for Xpert Xpress SARS-CoV-2/FLU/RSV testing.  Fact Sheet for Patients: EntrepreneurPulse.com.au  Fact Sheet for Healthcare Providers: IncredibleEmployment.be  This test is not yet approved or cleared by the [place] FDA and has been authorized for detection and/or diagnosis of SARS-CoV-2 by FDA under an Emergency Use Authorization (EUA). This EUA will remain in effect (meaning this test can be used) for the duration of the COVID-19 declaration under Section 564(b)(1) of the Act, 21 U.S.C. section 360bbb-3(b)(1), unless the authorization is terminated or revoked.  Performed at [facility], [place] [address], [place], [place] [address]      Radiological Exams on Admission: CT ABDOMEN PELVIS WO CONTRAST  Result Date: 09/04/2021 CLINICAL DATA:  Abdominal distension and pain. Acute urinary retention. EXAM: CT ABDOMEN AND PELVIS WITHOUT CONTRAST TECHNIQUE: Multidetector CT imaging of the abdomen and pelvis was performed following the standard protocol without IV contrast. COMPARISON:  CT 06/29/2015 FINDINGS: Lower chest: No acute airspace disease or pleural effusion. Upper normal heart size with coronary artery calcifications. Hepatobiliary: Focal fatty infiltration  adjacent to the falciform ligament. No suspicious liver lesion on this unenhanced exam. Pneumobilia in the left lobe, unchanged. Cholecystectomy. Normal caliber common bile duct. Pancreas: No ductal dilatation or inflammation. Spleen: Normal in size without focal abnormality. Adrenals/Urinary Tract: No adrenal nodule. Mild bilateral hydronephrosis. Moderate bilateral perinephric edema, symmetric. No renal or ureteral calculi. Right retroperitoneal calcifications are adjacent to the right ureter. No evidence of focal renal lesion on this unenhanced exam. Foley catheter decompresses the urinary bladder. There is mild perivesicular edema. Stomach/Bowel: Mild wall thickening of the mid sigmoid colon, series 2, image 50, but no pericolonic edema or inflammation. Moderate volume of colonic stool. No small bowel obstruction or inflammatory change. Normal appendix. Stomach is partially distended. Vascular/Lymphatic: Moderate aortic and branch atherosclerosis. No portal venous or mesenteric gas. Suspected retroaortic left renal vein. No bulky abdominopelvic adenopathy. Reproductive: Markedly enlarged prostate gland. Prostate measures 6.6 x 7.7 x 7.3 cm (volume = 190 cm^3). Other: Fat containing left inguinal hernia. Minimal fat in the right inguinal canal. No abdominal ascites or free air. Musculoskeletal: Unilateral left L5 pars defects. Multilevel degenerative change in the spine. There are no acute or suspicious osseous abnormalities. IMPRESSION: 1. Markedly enlarged prostate gland. Mild bilateral hydronephrosis with perinephric and perivesicular edema. Findings may be secondary to chronic bladder outlet obstruction or urinary tract infection. 2. Mild wall thickening of the mid sigmoid colon, but no pericolonic edema or inflammation. Recommend up-to-date colonoscopy. Aortic Atherosclerosis (ICD10-I70.0). Electronically Signed   By: Keith Rake M.D.   On: 09/04/2021 19:06   DG Chest Portable 1 View  Result Date:  09/04/2021 CLINICAL DATA:  Weakness altered EXAM: PORTABLE CHEST 1 VIEW COMPARISON:  CT 03/02/2016 FINDINGS: The heart size and mediastinal contours are within normal limits. Both lungs are clear. The visualized skeletal structures are unremarkable. IMPRESSION: No active disease. Electronically Signed   By: Donavan Foil M.D.   On: 09/04/2021 18:42      Assessment/Plan Principal Problem:   ARF (acute renal failure) (HCC) Active Problems:   Essential hypertension, benign   Diabetes mellitus type 2 in nonobese (HCC)   Generalized weakness    Acute renal failure likely from obstructive uropathy for which  patient has been placed on a Foley catheter.  We will gently hydrate follow metabolic panel patient will need follow-up with urologist. Diarrhea with CT scan showing sigmoid colitis -we will check stool studies and patient's recent colonoscopy done in September 2022 did show sigmoid colitis.  We will check lactic acid gently hydrate and will consult [facility]. Diabetes mellitus type 2 we will keep patient on sliding scale coverage last hemoglobin A1c was around 7.4 in August 2022. Hypertension uncontrolled -  we will continue metoprolol and Hytrin.  We need to confirm further medications.  Patient likely is on ACE inhibitor's which we will hold due to acute renal failure.  As needed IV hydralazine. Generalized weakness could be from poor oral intake and diarrhea and deconditioning.  Gently hydrate advance diet as tolerated get physical therapy consult.  In addition also ordered TSH and CK levels. History of hyperlipidemia intolerant to statins.  Home medicines needs to be verified.   DVT prophylaxis: Heparin. Code Status: Full code. Family Communication: Family at the bedside. Disposition Plan: Home. Consults called: [facility] was notified through secure chat. Admission status: Observation.   Rise Patience MD [facility] Pager [phone].  If 7PM-7AM, please contact  night-coverage [email or web address] Password [facility]  09/04/2021, 11:22 PM

## 2021-09-05 DIAGNOSIS — N400 Enlarged prostate without lower urinary tract symptoms: Secondary | ICD-10-CM

## 2021-09-05 DIAGNOSIS — R197 Diarrhea, unspecified: Secondary | ICD-10-CM

## 2021-09-05 DIAGNOSIS — E119 Type 2 diabetes mellitus without complications: Secondary | ICD-10-CM | POA: Diagnosis not present

## 2021-09-05 DIAGNOSIS — I10 Essential (primary) hypertension: Secondary | ICD-10-CM | POA: Diagnosis not present

## 2021-09-05 DIAGNOSIS — N179 Acute kidney failure, unspecified: Secondary | ICD-10-CM | POA: Diagnosis not present

## 2021-09-05 DIAGNOSIS — R531 Weakness: Secondary | ICD-10-CM | POA: Diagnosis not present

## 2021-09-05 LAB — CBC WITH DIFFERENTIAL/PLATELET
Abs Immature Granulocytes: 0.02 10*3/uL (ref 0.00–0.07)
Basophils Absolute: 0 10*3/uL (ref 0.0–0.1)
Basophils Relative: 0 %
Eosinophils Absolute: 0.1 10*3/uL (ref 0.0–0.5)
Eosinophils Relative: 2 %
HCT: 38.9 % — ABNORMAL LOW (ref 39.0–52.0)
Hemoglobin: 12.6 g/dL — ABNORMAL LOW (ref 13.0–17.0)
Immature Granulocytes: 0 %
Lymphocytes Relative: 18 %
Lymphs Abs: 0.9 10*3/uL (ref 0.7–4.0)
MCH: 28.7 pg (ref 26.0–34.0)
MCHC: 32.4 g/dL (ref 30.0–36.0)
MCV: 88.6 fL (ref 80.0–100.0)
Monocytes Absolute: 0.7 10*3/uL (ref 0.1–1.0)
Monocytes Relative: 14 %
Neutro Abs: 3.5 10*3/uL (ref 1.7–7.7)
Neutrophils Relative %: 66 %
Platelets: 164 10*3/uL (ref 150–400)
RBC: 4.39 MIL/uL (ref 4.22–5.81)
RDW: 13.8 % (ref 11.5–15.5)
WBC: 5.3 10*3/uL (ref 4.0–10.5)
nRBC: 0 % (ref 0.0–0.2)

## 2021-09-05 LAB — COMPREHENSIVE METABOLIC PANEL
ALT: 18 U/L (ref 0–44)
AST: 11 U/L — ABNORMAL LOW (ref 15–41)
Albumin: 2.8 g/dL — ABNORMAL LOW (ref 3.5–5.0)
Alkaline Phosphatase: 32 U/L — ABNORMAL LOW (ref 38–126)
Anion gap: 7 (ref 5–15)
BUN: 45 mg/dL — ABNORMAL HIGH (ref 8–23)
CO2: 25 mmol/L (ref 22–32)
Calcium: 8.9 mg/dL (ref 8.9–10.3)
Chloride: 109 mmol/L (ref 98–111)
Creatinine, Ser: 1.66 mg/dL — ABNORMAL HIGH (ref 0.61–1.24)
GFR, Estimated: 42 mL/min — ABNORMAL LOW (ref >60)
Glucose, Bld: 217 mg/dL — ABNORMAL HIGH (ref 70–99)
Potassium: 3.3 mmol/L — ABNORMAL LOW (ref 3.5–5.1)
Sodium: 141 mmol/L (ref 135–145)
Total Bilirubin: 0.8 mg/dL (ref 0.3–1.2)
Total Protein: 5.9 g/dL — ABNORMAL LOW (ref 6.5–8.1)

## 2021-09-05 LAB — GLUCOSE, CAPILLARY
Glucose-Capillary: 183 mg/dL — ABNORMAL HIGH (ref 70–99)
Glucose-Capillary: 142 mg/dL — ABNORMAL HIGH (ref 70–99)
Glucose-Capillary: 147 mg/dL — ABNORMAL HIGH (ref 70–99)
Glucose-Capillary: 188 mg/dL — ABNORMAL HIGH (ref 70–99)

## 2021-09-05 LAB — TSH: TSH: 0.16 u[IU]/mL — ABNORMAL LOW (ref 0.350–4.500)

## 2021-09-05 LAB — URINE CULTURE: Culture: NO GROWTH

## 2021-09-05 LAB — CK: Total CK: 45 U/L — ABNORMAL LOW (ref 49–397)

## 2021-09-05 LAB — LACTIC ACID, PLASMA
Lactic Acid, Venous: 1.2 mmol/L (ref 0.5–1.9)
Lactic Acid, Venous: 1 mmol/L (ref 0.5–1.9)

## 2021-09-05 MED ORDER — FINASTERIDE 5 MG PO TABS
5.0000 mg | ORAL_TABLET | Freq: Every day | ORAL | Status: DC
  Administered 2021-09-05 – 2021-09-11 (×7): 5 mg via ORAL
  Filled 2021-09-05 (×7): qty 1

## 2021-09-05 MED ORDER — CHLORHEXIDINE GLUCONATE CLOTH 2 % EX PADS
6.0000 | MEDICATED_PAD | Freq: Every day | CUTANEOUS | Status: DC
  Administered 2021-09-05 – 2021-09-11 (×7): 6 via TOPICAL

## 2021-09-05 NOTE — Consult Note (Addendum)
Referring Provider: [facility] PCP: Biagio Borg, MD  Gastroenterologist: [name], MD  Reason for consultation:    sigmoid inflammation on CT scan               ASSESSMENT / PLAN   #   80 yo male admitted with weakness, urinary symptoms, AKI , diarrhea. CTAP >> enlarged prostate , obstructive uropathy. Has foley now. Urine culture pending  # Incidental findings of active sigmoid colitis on polyp surveillance colonoscopy Sept 2022. No further evaluation was required as he was asymptomatic. Biopsies didn't suggest IBD but rather SCAD vrs ischemic vrs med related vrs infection. Now with one week history of explosive diarrhea. CTAP suggesting mild sigmoid wall thickening. Rule out infectious etiology. -- --Ischemia seems unlikely. If stools studies negative and diarrhea persists could consider trial of mesalamine for ? SCAD at some point ( when renal function improves)  # AKI /  Obstructive uropathy. Foley placed. Creatinine 2.4 >> 1.66  # ? Weight loss. His weight yesterday documented at 174 pounds ( his baseline)but on recheck it was in the 150's.  Albumin 2.8.  --Will ask staff to weigh him again  # Additional medical history listed below.   HISTORY OF PRESENT ILLNESS                                                                                                                         Chief Complaint:  diarrhea  Neil [name] is a 80 y.o. male with a past medical history significant for DM, HTN, enlarged prostate, diverticulosis, choledocholithiasis s/p CBD stone extraction and sphincterotomy in 2016, cholecystectomy, recurrent adenomatous colon polyps.  See [facility] for any additional medical problems.    ED course:  Patient presented to ED yesterday for evaluation of altered mental status, urinary symptoms and weakness. He was afebrile, VSS. His CBC was normal. Glucose in 250, his had AKI with creatinine of 2.4, liver chemistries normal. Had had urinary retention with >  900 ml found on bladder scan.  Noncon CTAP raised concern for bladder outlet obstruction or UTI. Also with mild mid sigmoid wall thickening.   Non-contrast CTAP  Markedly enlarged prostate gland. Mild bilateral hydronephrosis with perinephric and perivesicular edema. Findings may be secondary to chronic bladder outlet obstruction or urinary tract infection. 2. Mild wall thickening of the mid sigmoid colon, but no pericolonic edema or inflammation. Recommend up-to-date colonoscopy.  Mr. Faulcon started having explosive , non-bloody diarrhea one week ago. He has been having ~ 3 episodes a day. No associated abdominal cramps but was having abdominal pain which he says was related to his bladder and has now resolved. No fevers. No nausea / vomiting. He hasn't had any recent antibiotics and says there haven't been any recent changes to his home medications. No contact with others with known similar GI symptoms.     Imaging:  CT ABDOMEN PELVIS WO CONTRAST  Result Date: 09/04/2021 CLINICAL DATA:  Abdominal distension and pain.  Acute urinary retention. EXAM: CT ABDOMEN AND PELVIS WITHOUT CONTRAST TECHNIQUE: Multidetector CT imaging of the abdomen and pelvis was performed following the standard protocol without IV contrast. COMPARISON:  CT 06/29/2015 FINDINGS: Lower chest: No acute airspace disease or pleural effusion. Upper normal heart size with coronary artery calcifications. Hepatobiliary: Focal fatty infiltration adjacent to the falciform ligament. No suspicious liver lesion on this unenhanced exam. Pneumobilia in the left lobe, unchanged. Cholecystectomy. Normal caliber common bile duct. Pancreas: No ductal dilatation or inflammation. Spleen: Normal in size without focal abnormality. Adrenals/Urinary Tract: No adrenal nodule. Mild bilateral hydronephrosis. Moderate bilateral perinephric edema, symmetric. No renal or ureteral calculi. Right retroperitoneal calcifications are adjacent to the right ureter.  No evidence of focal renal lesion on this unenhanced exam. Foley catheter decompresses the urinary bladder. There is mild perivesicular edema. Stomach/Bowel: Mild wall thickening of the mid sigmoid colon, series 2, image 50, but no pericolonic edema or inflammation. Moderate volume of colonic stool. No small bowel obstruction or inflammatory change. Normal appendix. Stomach is partially distended. Vascular/Lymphatic: Moderate aortic and branch atherosclerosis. No portal venous or mesenteric gas. Suspected retroaortic left renal vein. No bulky abdominopelvic adenopathy. Reproductive: Markedly enlarged prostate gland. Prostate measures 6.6 x 7.7 x 7.3 cm (volume = 190 cm^3). Other: Fat containing left inguinal hernia. Minimal fat in the right inguinal canal. No abdominal ascites or free air. Musculoskeletal: Unilateral left L5 pars defects. Multilevel degenerative change in the spine. There are no acute or suspicious osseous abnormalities. IMPRESSION: 1. Markedly enlarged prostate gland. Mild bilateral hydronephrosis with perinephric and perivesicular edema. Findings may be secondary to chronic bladder outlet obstruction or urinary tract infection. 2. Mild wall thickening of the mid sigmoid colon, but no pericolonic edema or inflammation. Recommend up-to-date colonoscopy. Aortic Atherosclerosis (ICD10-I70.0). Electronically Signed   By: Keith Rake M.D.   On: 09/04/2021 19:06   DG Chest Portable 1 View  Result Date: 09/04/2021 CLINICAL DATA:  Weakness altered EXAM: PORTABLE CHEST 1 VIEW COMPARISON:  CT 03/02/2016 FINDINGS: The heart size and mediastinal contours are within normal limits. Both lungs are clear. The visualized skeletal structures are unremarkable. IMPRESSION: No active disease. Electronically Signed   By: Donavan Foil M.D.   On: 09/04/2021 18:42     PREVIOUS ENDOSCOPIC EVALUATIONS  / IMAGING STUDIES   Polyp surveillance colonoscopy Sept 2022 --complete exam, excellent prep. A 3 mm TA was  removed. A 10 cm segment of mild moderate colitis found in sigmoid. Exam o/w normal. Sigmoid biopsies c/w active colitis. No granulomata or dysplasia.   Past Medical History:  Diagnosis Date   ALLERGIC RHINITIS 12/04/2007   Arthritis    bilateral hands, shoulders, and knees   BENIGN PROSTATIC HYPERTROPHY 06/05/2007   BUNDLE BRANCH BLOCK, RIGHT 07/21/2009   BURSITIS, RIGHT SHOULDER 11/06/2010   Choledocholithiasis    CKD (chronic kidney disease) stage 3, GFR 30-59 ml/min (HCC) 08/02/2017   COLONIC POLYPS, HX OF 06/05/2007   tubular adenoma also 02/19/2013   Coronary artery calcification seen on CT scan 03/07/2016   DIABETES MELLITUS, TYPE [name] 12/04/2007   pt states not diabetic-no meds 02-05-13 PV   DIVERTICULOSIS, COLON 07/21/2009   ERECTILE DYSFUNCTION 06/05/2007   Gallstones    HYPERLIPIDEMIA 06/05/2007   on meds   HYPERTENSION 06/05/2007   on meds   Hypotension    INGUINAL HERNIA, RIGHT, SMALL 11/06/2010   Liver abscess 05/23/2015   SKIN LESION 06/03/2008    Past Surgical History:  Procedure Laterality Date   CHOLECYSTECTOMY N/A 03/02/2015  Procedure: LAPAROSCOPIC CHOLECYSTECTOMY;  Surgeon: Fanny Skates, MD;  Location: [facility];  Service: General;  Laterality: N/A;   COLONOSCOPY  2017   JP-MAC-suprep (good)-TA   ERCP N/A 03/01/2015   Procedure: ENDOSCOPIC RETROGRADE CHOLANGIOPANCREATOGRAPHY (ERCP);  Surgeon: Irene Shipper, MD;  Location: [facility];  Service: Endoscopy;  Laterality: N/A;  Dr Dalbert Batman wants patient to stay overnight after ERCP   ERCP N/A 05/03/2015   Procedure: ENDOSCOPIC RETROGRADE CHOLANGIOPANCREATOGRAPHY (ERCP);  Surgeon: Irene Shipper, MD;  Location: [facility];  Service: Endoscopy;  Laterality: N/A;   FINGER SURGERY Left    "little finger"   [place] Right 2014   8'14 repair   POLYPECTOMY     TONSILLECTOMY     WISDOM TOOTH EXTRACTION  1972    Prior to Admission medications   Medication Sig Start Date  End Date Taking? Authorizing Provider  acetaminophen (TYLENOL) 500 MG tablet Take 1,500 mg by mouth every 6 (six) hours as needed.    [provider]  Alpha-D-Galactosidase (BEANO PO) Take 1 tablet by mouth daily as needed.    [provider]  aspirin 81 MG EC tablet Take 81 mg by mouth daily. 10/15/08   [provider]  benazepril (LOTENSIN) 40 MG tablet TAKE 1 TABLET BY MOUTH  DAILY 07/25/21   Biagio Borg, MD  cholecalciferol (VITAMIN D) 1000 units tablet Take 2,000 Units by mouth daily.    [provider]  citalopram (CELEXA) 10 MG tablet TAKE 1 TABLET BY MOUTH  DAILY 07/12/21   Biagio Borg, MD  Cyanocobalamin (B-12) 1000 MCG TABS Take 1 tablet by mouth daily at 6 (six) AM. 01/25/19   [provider]  cyclobenzaprine (FLEXERIL) 5 MG tablet TAKE 1 TABLET BY MOUTH 3  TIMES DAILY AS NEEDED FOR  MUSCLE SPASM(S) 05/09/21   Biagio Borg, MD  fenofibrate 160 MG tablet TAKE 1 TABLET BY MOUTH AT  BEDTIME 07/25/21   Biagio Borg, MD  Ibuprofen (IBU-200 PO) Take 1 tablet by mouth daily at 6 (six) AM. 05/13/21   [provider]  metFORMIN (GLUCOPHAGE-XR) 500 MG 24 hr tablet Take 1 tablet (500 mg total) by mouth daily with breakfast. 07/19/21   Biagio Borg, MD  metoprolol succinate (TOPROL-XL) 50 MG 24 hr tablet Take 1 tablet (50 mg total) by mouth daily. Take with or immediately following a meal. 11/24/20   Biagio Borg, MD  sodium chloride (OCEAN) 0.65 % nasal spray Place 1 spray into the nose daily as needed for congestion.     [provider]  terazosin (HYTRIN) 2 MG capsule TAKE 1 CAPSULE BY MOUTH  DAILY 07/25/21   Biagio Borg, MD    Current Facility-Administered Medications  Medication Dose Route Frequency Provider Last Rate Last Admin   acetaminophen (TYLENOL) tablet 650 mg  650 mg Oral Q6H PRN Rise Patience, MD       Or   acetaminophen (TYLENOL) suppository 650 mg  650 mg Rectal Q6H PRN Rise Patience, MD        Chlorhexidine Gluconate Cloth 2 % PADS 6 each  6 each Topical Daily Sheikh, Georgina Quint Wellington, DO       heparin injection 5,000 Units  5,000 Units Subcutaneous Q8H Rise Patience, MD   5,000 Units at 09/05/21 0501   hydrALAZINE (APRESOLINE) injection 5 mg  5 mg Intravenous Q4H PRN Rise Patience, MD   5 mg at 09/04/21 2026  insulin aspart (novoLOG) injection 0-9 Units  0-9 Units Subcutaneous TID WC Rise Patience, MD   2 Units at 09/05/21 5643   lactated ringers infusion   Intravenous Continuous Rise Patience, MD 100 mL/hr at 09/05/21 0846 New Bag at 09/05/21 0846   metoprolol succinate (TOPROL-XL) 24 hr tablet 50 mg  50 mg Oral Daily Rise Patience, MD       terazosin (HYTRIN) capsule 2 mg  2 mg Oral Daily Rise Patience, MD        Allergies as of 09/04/2021 - Review Complete 09/04/2021  Allergen Reaction Noted   Statins Other (See Comments) 12/04/2007   Zanaflex [tizanidine] Other (See Comments) 10/22/2019   Zetia [ezetimibe] Other (See Comments) 07/21/2009    Family History  Problem Relation Age of Onset   Colon polyps Mother 36   Colon cancer Mother 26   Brain cancer Mother 77       mets from colon cancer   Colon cancer Paternal Uncle    Colon polyps Paternal Uncle    Rectal cancer Neg Hx    Stomach cancer Neg Hx    Esophageal cancer Neg Hx     Social History   Socioeconomic History   Marital status: Widowed    Spouse name: Not on file   Number of children: Not on file   Years of education: Not on file   Highest education level: Not on file  Occupational History   Occupation: retired  Tobacco Use   Smoking status: Former    Types: Cigarettes    Quit date: 2008    Years since quitting: 14.9   Smokeless tobacco: Never  Vaping Use   Vaping Use: Never used  Substance and Sexual Activity   Alcohol use: No    Alcohol/week: 0.0 standard drinks   Drug use: No   Sexual activity: Not Currently  Other Topics Concern   Not on file  Social  History Narrative   Not on file   Social Determinants of Health   Financial Resource Strain: Low Risk    Difficulty of Paying Living Expenses: Not hard at all  Food Insecurity: No Food Insecurity   Worried About Charity fundraiser in the Last Year: Never true   [place] in the Last Year: Never true  Transportation Needs: No Transportation Needs   Lack of Transportation (Medical): No   Lack of Transportation (Non-Medical): No  Physical Activity: Insufficiently Active   Days of Exercise per Week: 3 days   Minutes of Exercise per Session: 30 min  Stress: No Stress Concern Present   Feeling of Stress : Not at all  Social Connections: Moderately Integrated   Frequency of Communication with Friends and Family: Three times a week   Frequency of Social Gatherings with Friends and Family: Once a week   Attends Religious Services: More than 4 times per year   Active Member of [employer] or Organizations: Yes   Attends Archivist Meetings: Never   Marital Status: Widowed  Human resources officer Violence: Not At Risk   Fear of Current or Ex-Partner: No   Emotionally Abused: No   Physically Abused: No   Sexually Abused: No    Review of Systems: All systems reviewed and negative except where noted in HPI.   OBJECTIVE    Physical Exam: Vital signs in last 24 hours: Temp:  [97.5 F (36.4 C)-98.9 F (37.2 C)] 97.9 F (36.6 C) (11/22 0503) Pulse Rate:  [77-97] 80 (  11/22 0503) Resp:  [18-24] 18 (11/22 0503) BP: (132-190)/(67-102) 132/75 (11/22 0503) SpO2:  [96 %-100 %] 96 % (11/22 0503) FiO2 (%):  [21 %] 21 % (11/21 2316) Weight:  [69.7 kg-79 kg] 69.7 kg (11/21 2201) Last BM Date: 09/05/21  General:  Alert male in NAD Psych:  Pleasant, cooperative. Normal mood and affect Eyes: Pupils equal, no icterus. Conjunctive pink Ears:  Normal auditory acuity Nose: No deformity, discharge or lesions Neck:  Supple, no masses felt Lungs:  Clear to auscultation.  Heart:  Regular  rate, irregular rhythm. No lower extremity edema Abdomen:  Soft, nondistended, mild right mid abdominal tenderness. active bowel sounds, no masses felt Rectal :  Deferred Msk: Symmetrical without gross deformities.  Neurologic:  Alert, oriented, grossly normal neurologically Skin:  Intact without significant lesions.    Scheduled inpatient medications  Chlorhexidine Gluconate Cloth  6 each Topical Daily   heparin  5,000 Units Subcutaneous Q8H   insulin aspart  0-9 Units Subcutaneous TID WC   metoprolol succinate  50 mg Oral Daily   terazosin  2 mg Oral Daily      Intake/Output from previous day: 11/21 0701 - 11/22 0700 In: 697.5 [P.O.:240; I.V.:457.5] Out: 3325 [Urine:3325] Intake/Output this shift: Total I/O In: 120 [P.O.:120] Out: -    Lab Results: Recent Labs    09/04/21 1734 09/05/21 0330  WBC 7.3 5.3  HGB 14.2 12.6*  HCT 43.4 38.9*  PLT 202 164   BMET Recent Labs    09/04/21 1734 09/05/21 0330  NA 140 141  K 3.6 3.3*  CL 103 109  CO2 26 25  GLUCOSE 254* 217*  BUN 58* 45*  CREATININE 2.44* 1.66*  CALCIUM 9.9 8.9   LFTs Recent Labs    09/05/21 0330  PROT 5.9*  ALBUMIN 2.8*  AST 11*  ALT 18  ALKPHOS 32*  BILITOT 0.8   PT/INR No results for input(s): LABPROT, INR in the last 72 hours. Hepatitis Panel No results for input(s): HEPBSAG, HCVAB, HEPAIGM, HEPBIGM in the last 72 hours.   . CBC Latest Ref Rng & Units 09/05/2021 09/04/2021 11/20/2019  WBC 4.0 - 10.5 K/uL 5.3 7.3 6.5  Hemoglobin 13.0 - 17.0 g/dL 12.6(L) 14.2 14.6  Hematocrit 39.0 - 52.0 % 38.9(L) 43.4 44.4  Platelets 150 - 400 K/uL 164 202 234.0    . CMP Latest Ref Rng & Units 09/05/2021 09/04/2021 05/18/2021  Glucose 70 - 99 mg/dL 217(H) 254(H) -  BUN 8 - 23 mg/dL 45(H) 58(H) -  Creatinine 0.61 - 1.24 mg/dL 1.66(H) 2.44(H) -  Sodium 135 - 145 mmol/L 141 140 -  Potassium 3.5 - 5.1 mmol/L 3.3(L) 3.6 -  Chloride 98 - 111 mmol/L 109 103 -  CO2 22 - 32 mmol/L 25 26 -  Calcium 8.9 -  10.3 mg/dL 8.9 9.9 9.5  Total Protein 6.5 - 8.1 g/dL 5.9(L) 7.8 -  Total Bilirubin 0.3 - 1.2 mg/dL 0.8 0.8 -  Alkaline Phos 38 - 126 U/L 32(L) 40 -  AST 15 - 41 U/L 11(L) 14(L) -  ALT 0 - 44 U/L 18 22 -     Principal Problem:   ARF (acute renal failure) (HCC) Active Problems:   Essential hypertension, benign   Diabetes mellitus type 2 in nonobese [facility])   Generalized weakness    Tye Savoy, NP-C @  09/05/2021, 9:01 AM   Attending physician's note   I have taken an interval history, reviewed the chart and examined the patient. I agree with the  Advanced Practitioner's note, impression and recommendations.   [email or web address]  Adm d/t urinary obstruction d/t enlarged prostate req urinary catheter GI consulted d/t acute diarrhea (x 1 week), NCCT showing mild sigmoid wall thickening. Nl WBC count. Hb stable, K 3.3. Likely acute infectious colitis. No abdo pain. 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 -Managed conservatively. -Would consider FS only if continued diarrhea.  No plans currently.   RCarmell Austria MD LVelora HecklerGI [phone]

## 2021-09-05 NOTE — Plan of Care (Signed)
  Problem: Education: Goal: Knowledge of General Education information will improve Description Including pain rating scale, medication(s)/side effects and non-pharmacologic comfort measures Outcome: Progressing   

## 2021-09-05 NOTE — Progress Notes (Signed)
PROGRESS NOTE    Neil [name]  DXI:[id number] DOB: [birth year] DOA: 09/04/2021 PCP: Biagio Borg, MD  Brief Narrative: The patient is a 80 year old elderly Caucasian male with a past medical history significant for but limited to diabetes mellitus type 2 with last hemoglobin A1c of 7.4 in August, hypertension, hyperlipidemia as well as other comorbidities including BPH, history of bursitis, CKD stage III, history of colonic polyps, history of CAD seen on CT scan as well as other comorbidities who presented to the hospital because of weakness and confusion that has been persistent.  Patient also started developing diarrhea for the last week which was nonbloody and watery and he was unable to urinate for at least 1 week.  Denies any use of antibiotics.  In the ED he noted to have a distended abdomen and CT scan showed bladder outlet obstruction enlarged prostate.  There is also bilateral mild hydronephrosis and perinephric stranding around the bladder.  There is also signs for sigmoid wall thickening concerning for colitis.  Patient's labs revealed an acute renal failure with worsening creatinine from 1.3 and elevated blood glucose.  He had a Foley catheter placed and his abdominal pain improved but he continue be generally weak.  PT OT evaluated and recommending SNF.  GI was consulted due to his acute diarrhea and his CT did show mild sigmoid wall thickening and his WBC was normal limits however GI felt that he has an acute infectious colitis given that he did have some bloody stools.  They are recommending stool for GI pathology and recommending managing conservatively and consider Flexeril if he continues have diarrhea.  Assessment & Plan:   Principal Problem:   ARF (acute renal failure) (HCC) Active Problems:   Essential hypertension, benign   Diabetes mellitus type 2 in nonobese [facility])   Generalized weakness  Acute renal failure from AKI in the setting of BPH -Had a Foley catheter placed  and he is placed on gentle IV fluid hydration -Case was discussed with urology who recommends continuing Foley catheter for now and place the patient on terazosin as well as finasteride given size of his prostate -Renal function is improving and went from 58/2.44 and trended down to 45/1.66 -Continue with lactated Ringer's at 100 MLS per hour -Avoid further nephrotoxic medications, contrast dyes, hypotension renally dose medications -Urinalysis was done and showed a clear appearance with greater than 500 glucose, moderate hemoglobin, negative nitrites, negative leukocytes, rare bacteria, 0-5 WBCs and 11-20 RBCs per high-power field urine with culture pending -Repeat CMP in a.m.  Hypokalemia -Mild at 3.3 -Check magnesium level in the a.m. -Replete with potassium chloride 40 mg twice daily x2 doses -Repeat CMP in a.m.  Diarrhea with concern for sigmoid colitis -Checking stool studies -Lactic acid level was checked and was normal.  GIs been consulted recommending conservative care and following stool studies and only doing a flex sig if diarrhea persists -Continue with enteric precautions  Diabetes mellitus type 2 -Continue sliding scale insulin -Last hemoglobin A1c was 7.4 -Continue to monitor CBGs per protocol; last 3 CBGs ranging from 142-183  Hypertension -Uncontrolled -Continue with home medications with metoprolol succinate 50 mils p.o. daily as well as terazosin 2 mg p.o. daily -Continue to monitor blood pressures per protocol -Hold his ACE given his AKI -Placed on as needed hydralazine -Last blood pressure reading was  Generalized weakness -In setting of poor oral intake and deconditioning  -Was given gentle IV fluid hydration PT OT recommending SNF -Checking TSH and CK  level was normal  Hyperlipidemia -Patient is intolerant to statin  Normocytic anemia -Likely dilutional drop in the setting of IV fluid resuscitation -Is likely hemoconcentrated on admission and  hemoglobin/hematocrit went from 14.2/43.4 and trended down to 12.6/38.9 -Check anemia panel in a.m. -Continue to monitor for signs and symptoms of bleeding; currently no overt bleeding noted -Repeat CBC in a.m.  DVT prophylaxis: Heparin 5000 units subcu Code Status: Full code Family Communication: No family currently at bedside Disposition Plan: Pending further clinical improvement and clearance by GI as well as improvement in his renal function  Status is: Observation  The patient will require care spanning > 2 midnights and should be moved to inpatient because: Given continued IV fluid hydration and continued work-up from [facility] perspective  Consultants:  Gastroenterology Discussed Case with Urology  Procedures: None  Antimicrobials:  Anti-infectives (From admission, onward)    None        Subjective: Seen and examined at bedside and was still having diarrhea.  Wanting his Foley catheter out.  States that his pressure and abdomen is improved but continues to have some.  No lightheadedness or dizziness.  No other concerns or complaints this time but remains weak.  Objective: Vitals:   09/05/21 0503 09/05/21 0955 09/05/21 1001 09/05/21 1300  BP: 132/75  (!) 143/61 (!) 150/65  Pulse: 80  83 79  Resp: 18  20 20   Temp: 97.9 F (36.6 C)  99 F (37.2 C) 99.3 F (37.4 C)  TempSrc: Oral  Oral Oral  SpO2: 96%  96% 95%  Weight:  71.2 kg    Height:        Intake/Output Summary (Last 24 hours) at 09/05/2021 2035 Last data filed at 09/05/2021 1839 Gross per 24 hour  Intake 1177.45 ml  Output 3175 ml  Net -1997.55 ml   Filed Weights   09/04/21 1718 09/04/21 2201 09/05/21 0955  Weight: 79 kg 69.7 kg 71.2 kg   Examination: Physical Exam:  Constitutional: Thin chronically ill-appearing Caucasian male currently in no acute distress appears calm but slightly uncomfortable Eyes: Lids and conjunctivae normal, sclerae anicteric  ENMT: External Ears, Nose appear normal. Grossly  normal hearing. Mucous membranes are moist. Neck: Appears normal, supple, no cervical masses, normal ROM, no appreciable thyromegaly; no appreciable JVD Respiratory: Diminished to auscultation bilaterally, no wheezing, rales, rhonchi or crackles. Normal respiratory effort and patient is not tachypenic. No accessory muscle use.  Unlabored breathing Cardiovascular: RRR, no murmurs / rubs / gallops. S1 and S2 auscultated. No extremity edema.  Abdomen: Soft, mildly-tender, slightly-distended. Bowel sounds positive and a little hyperactive GU: Deferred. Musculoskeletal: No clubbing / cyanosis of digits/nails.  No appreciable joint deformity noted in the upper and lower extremities Skin: No rashes, lesions, ulcers on limited skin evaluation. No induration; Warm and dry.  Neurologic: CN 2-12 grossly intact with no focal deficits. Romberg sign and cerebellar reflexes not assessed.  Psychiatric: Normal judgment and insight. Alert and oriented x 3.  Slightly anxious mood and appropriate affect.   Data Reviewed: I have personally reviewed following labs and imaging studies  CBC: Recent Labs  Lab 09/04/21 1734 09/05/21 0330  WBC 7.3 5.3  NEUTROABS 5.6 3.5  HGB 14.2 12.6*  HCT 43.4 38.9*  MCV 87.0 88.6  PLT 202 948   Basic Metabolic Panel: Recent Labs  Lab 09/04/21 1734 09/05/21 0330  NA 140 141  K 3.6 3.3*  CL 103 109  CO2 26 25  GLUCOSE 254* 217*  BUN 58* 45*  CREATININE  2.44* 1.66*  CALCIUM 9.9 8.9   GFR: Estimated Creatinine Clearance: 34.9 mL/min (A) (by C-G formula based on SCr of 1.66 mg/dL (H)). Liver Function Tests: Recent Labs  Lab 09/04/21 1734 09/05/21 0330  AST 14* 11*  ALT 22 18  ALKPHOS 40 32*  BILITOT 0.8 0.8  PROT 7.8 5.9*  ALBUMIN 3.6 2.8*   No results for input(s): LIPASE, AMYLASE in the last 168 hours. No results for input(s): AMMONIA in the last 168 hours. Coagulation Profile: No results for input(s): INR, PROTIME in the last 168 hours. Cardiac  Enzymes: Recent Labs  Lab 09/05/21 0330  CKTOTAL 45*   BNP (last 3 results) No results for input(s): PROBNP in the last 8760 hours. HbA1C: No results for input(s): HGBA1C in the last 72 hours. CBG: Recent Labs  Lab 09/05/21 0755 09/05/21 1119 09/05/21 1631  GLUCAP 183* 142* 147*   Lipid Profile: No results for input(s): CHOL, HDL, LDLCALC, TRIG, CHOLHDL, LDLDIRECT in the last 72 hours. Thyroid Function Tests: Recent Labs    09/05/21 0330  TSH 0.160*   Anemia Panel: No results for input(s): VITAMINB12, FOLATE, FERRITIN, TIBC, IRON, RETICCTPCT in the last 72 hours. Sepsis Labs: Recent Labs  Lab 09/05/21 0041 09/05/21 0330  LATICACIDVEN 1.2 1.0    Recent Results (from the past 240 hour(s))  Resp Panel by RT-PCR (Flu A&B, Covid) Nasopharyngeal Swab     Status: None   Collection Time: 09/04/21  9:15 PM   Specimen: Nasopharyngeal Swab; Nasopharyngeal(NP) swabs in vial transport medium  Result Value Ref Range Status   SARS Coronavirus 2 by RT PCR NEGATIVE NEGATIVE Final    Comment: (NOTE) SARS-CoV-2 target nucleic acids are NOT DETECTED.  The SARS-CoV-2 RNA is generally detectable in upper respiratory specimens during the acute phase of infection. The lowest concentration of SARS-CoV-2 viral copies this assay can detect is 138 copies/mL. A negative result does not preclude SARS-Cov-2 infection and should not be used as the sole basis for treatment or other patient management decisions. A negative result may occur with  improper specimen collection/handling, submission of specimen other than nasopharyngeal swab, presence of viral mutation(s) within the areas targeted by this assay, and inadequate number of viral copies(<138 copies/mL). A negative result must be combined with clinical observations, patient history, and epidemiological information. The expected result is Negative.  Fact Sheet for Patients:  EntrepreneurPulse.com.au  Fact Sheet for  Healthcare Providers:  IncredibleEmployment.be  This test is no t yet approved or cleared by the [place] FDA and  has been authorized for detection and/or diagnosis of SARS-CoV-2 by FDA under an Emergency Use Authorization (EUA). This EUA will remain  in effect (meaning this test can be used) for the duration of the COVID-19 declaration under Section 564(b)(1) of the Act, 21 U.S.C.section 360bbb-3(b)(1), unless the authorization is terminated  or revoked sooner.       Influenza A by PCR NEGATIVE NEGATIVE Final   Influenza B by PCR NEGATIVE NEGATIVE Final    Comment: (NOTE) The Xpert Xpress SARS-CoV-2/FLU/RSV plus assay is intended as an aid in the diagnosis of influenza from Nasopharyngeal swab specimens and should not be used as a sole basis for treatment. Nasal washings and aspirates are unacceptable for Xpert Xpress SARS-CoV-2/FLU/RSV testing.  Fact Sheet for Patients: EntrepreneurPulse.com.au  Fact Sheet for Healthcare Providers: IncredibleEmployment.be  This test is not yet approved or cleared by the [place] FDA and has been authorized for detection and/or diagnosis of SARS-CoV-2 by FDA under an Emergency Use Authorization (  EUA). This EUA will remain in effect (meaning this test can be used) for the duration of the COVID-19 declaration under Section 564(b)(1) of the Act, 21 U.S.C. section 360bbb-3(b)(1), unless the authorization is terminated or revoked.  Performed at [facility], [place] [address], [place], [place] [address]     RN Pressure Injury Documentation:     Estimated body mass index is 23.87 kg/m as calculated from the following:   Height as of this encounter: 5\' 8"  (1.727 m).   Weight as of this encounter: 71.2 kg.  Malnutrition Type:   Malnutrition Characteristics:   Nutrition Interventions:    Radiology Studies: CT ABDOMEN PELVIS WO CONTRAST  Result Date:  09/04/2021 CLINICAL DATA:  Abdominal distension and pain. Acute urinary retention. EXAM: CT ABDOMEN AND PELVIS WITHOUT CONTRAST TECHNIQUE: Multidetector CT imaging of the abdomen and pelvis was performed following the standard protocol without IV contrast. COMPARISON:  CT 06/29/2015 FINDINGS: Lower chest: No acute airspace disease or pleural effusion. Upper normal heart size with coronary artery calcifications. Hepatobiliary: Focal fatty infiltration adjacent to the falciform ligament. No suspicious liver lesion on this unenhanced exam. Pneumobilia in the left lobe, unchanged. Cholecystectomy. Normal caliber common bile duct. Pancreas: No ductal dilatation or inflammation. Spleen: Normal in size without focal abnormality. Adrenals/Urinary Tract: No adrenal nodule. Mild bilateral hydronephrosis. Moderate bilateral perinephric edema, symmetric. No renal or ureteral calculi. Right retroperitoneal calcifications are adjacent to the right ureter. No evidence of focal renal lesion on this unenhanced exam. Foley catheter decompresses the urinary bladder. There is mild perivesicular edema. Stomach/Bowel: Mild wall thickening of the mid sigmoid colon, series 2, image 50, but no pericolonic edema or inflammation. Moderate volume of colonic stool. No small bowel obstruction or inflammatory change. Normal appendix. Stomach is partially distended. Vascular/Lymphatic: Moderate aortic and branch atherosclerosis. No portal venous or mesenteric gas. Suspected retroaortic left renal vein. No bulky abdominopelvic adenopathy. Reproductive: Markedly enlarged prostate gland. Prostate measures 6.6 x 7.7 x 7.3 cm (volume = 190 cm^3). Other: Fat containing left inguinal hernia. Minimal fat in the right inguinal canal. No abdominal ascites or free air. Musculoskeletal: Unilateral left L5 pars defects. Multilevel degenerative change in the spine. There are no acute or suspicious osseous abnormalities. IMPRESSION: 1. Markedly enlarged  prostate gland. Mild bilateral hydronephrosis with perinephric and perivesicular edema. Findings may be secondary to chronic bladder outlet obstruction or urinary tract infection. 2. Mild wall thickening of the mid sigmoid colon, but no pericolonic edema or inflammation. Recommend up-to-date colonoscopy. Aortic Atherosclerosis (ICD10-I70.0). Electronically Signed   By: Keith Rake M.D.   On: 09/04/2021 19:06   DG Chest Portable 1 View  Result Date: 09/04/2021 CLINICAL DATA:  Weakness altered EXAM: PORTABLE CHEST 1 VIEW COMPARISON:  CT 03/02/2016 FINDINGS: The heart size and mediastinal contours are within normal limits. Both lungs are clear. The visualized skeletal structures are unremarkable. IMPRESSION: No active disease. Electronically Signed   By: Donavan Foil M.D.   On: 09/04/2021 18:42    Scheduled Meds:  Chlorhexidine Gluconate Cloth  6 each Topical Daily   finasteride  5 mg Oral Daily   heparin  5,000 Units Subcutaneous Q8H   insulin aspart  0-9 Units Subcutaneous TID WC   metoprolol succinate  50 mg Oral Daily   terazosin  2 mg Oral Daily   Continuous Infusions:  lactated ringers Stopped (09/05/21 1822)    LOS: 0 days   Kerney Elbe, DO [facility] PAGER is on AMION  If 7PM-7AM, please contact night-coverage  [email or web address]

## 2021-09-05 NOTE — Evaluation (Signed)
Physical Therapy Evaluation Patient Details Name: Neil [name] MRN: [id number] DOB: 26-May-1941 Today's Date: 09/05/2021  History of Present Illness  80 yo male admitted with ARF, weakness, sigmoid colitis. Hx of DM, RBBB, CKD  Clinical Impression  On eval, pt required Mod A for mobility. He was able to stand and pivot over to the recliner with a RW. Unable to safely attempt ambulation on today- +2 assist unavailable. Pt presents with general weakness, decreased activity tolerance, and impaired gait and balance. Pt reports he lives at home alone and mobilized with a cane at  baseline. At this time, PT recommendation is for ST SNF.        Recommendations for follow up therapy are one component of a multi-disciplinary discharge planning process, led by the attending physician.  Recommendations may be updated based on patient status, additional functional criteria and insurance authorization.  Follow Up Recommendations Skilled nursing-short term rehab (<3 hours/day)    Assistance Recommended at Discharge    Functional Status Assessment Patient has had a recent decline in their functional status and demonstrates the ability to make significant improvements in function in a reasonable and predictable amount of time.  Equipment Recommendations  None recommended by PT    Recommendations for Other Services OT consult     Precautions / Restrictions Precautions Precautions: Fall Restrictions Weight Bearing Restrictions: No      Mobility  Bed Mobility Overal bed mobility: Needs Assistance Bed Mobility: Supine to Sit     Supine to sit: Mod assist;HOB elevated     General bed mobility comments: Assist for trunk. Increased time. Pt relied heavily on bedrail. Poor sitting posture/balance at EOB (pt with forward flexed trunk and inability to self correct/maintain correct upright posture)    Transfers Overall transfer level: Needs assistance Equipment used: Rolling walker (2  wheels) Transfers: Sit to/from Stand;Bed to chair/wheelchair/BSC Sit to Stand: Mod assist Stand pivot transfers: Min assist         General transfer comment: Assist to power up, stabilize, manage RW, control descent. Facilitation at sternum and sacrum to encourage full extension and upright posture. Cues for safety, technique, hand placemen, posture. Pt was able to take some shuffling steps over to recliner with RW. Increased time required.    Ambulation/Gait               General Gait Details: NT on today. +2 for safety needed  Stairs            Wheelchair Mobility    Modified Rankin (Stroke Patients Only)       Balance Overall balance assessment: Needs assistance         Standing balance support: Bilateral upper extremity supported;Reliant on assistive device for balance Standing balance-Leahy Scale: Poor                               Pertinent Vitals/Pain Pain Assessment: Faces Faces Pain Scale: No hurt    Home Living Family/patient expects to be discharged to:: Unsure Living Arrangements: Alone     Home Access: Level entry       Home Layout: One level Home Equipment: Conservation officer, nature (2 wheels);Tub bench;Cane - single point;Transport chair      Prior Function Prior Level of Function : Independent/Modified Independent             Mobility Comments: using cane for ambulation       Hand Dominance  Extremity/Trunk Assessment   Upper Extremity Assessment Upper Extremity Assessment: Defer to OT evaluation    Lower Extremity Assessment Lower Extremity Assessment: Generalized weakness    Cervical / Trunk Assessment Cervical / Trunk Assessment: Normal  Communication   Communication: No difficulties  Cognition Arousal/Alertness: Awake/alert Behavior During Therapy: WFL for tasks assessed/performed Overall Cognitive Status: No family/caregiver present to determine baseline cognitive functioning Area of Impairment:  Problem solving                             Problem Solving: Difficulty sequencing;Requires verbal cues          General Comments      Exercises     Assessment/Plan    PT Assessment Patient needs continued PT services  PT Problem List Decreased strength;Decreased mobility;Decreased activity tolerance;Decreased balance;Decreased knowledge of use of DME       PT Treatment Interventions DME instruction;Gait training;Therapeutic activities;Therapeutic exercise;Functional mobility training;Balance training;Patient/family education    PT Goals (Current goals can be found in the Care Plan section)  Acute Rehab PT Goals Patient Stated Goal: to get stronger PT Goal Formulation: With patient Time For Goal Achievement: 09/19/21 Potential to Achieve Goals: Good    Frequency Min 3X/week   Barriers to discharge        Co-evaluation               AM-PAC PT "6 Clicks" Mobility  Outcome Measure Help needed turning from your back to your side while in a flat bed without using bedrails?: A Lot Help needed moving from lying on your back to sitting on the side of a flat bed without using bedrails?: A Lot Help needed moving to and from a bed to a chair (including a wheelchair)?: A Lot Help needed standing up from a chair using your arms (e.g., wheelchair or bedside chair)?: A Lot Help needed to walk in hospital room?: A Lot Help needed climbing 3-5 steps with a railing? : Total 6 Click Score: 11    End of Session Equipment Utilized During Treatment: Gait belt Activity Tolerance: Patient tolerated treatment well Patient left: in chair;with call bell/phone within reach;with chair alarm set   PT Visit Diagnosis: Muscle weakness (generalized) (M62.81);Difficulty in walking, not elsewhere classified (R26.2)    Time: 2831-5176 PT Time Calculation (min) (ACUTE ONLY): 28 min   Charges:   PT Evaluation $PT Eval Moderate Complexity: 1 Mod PT Treatments $Gait Training:  8-22 mins          Doreatha Massed, PT Acute Rehabilitation  Office: [phone] Pager: [phone]

## 2021-09-05 NOTE — TOC Initial Note (Signed)
Transition of Care [facility]) - Initial/Assessment Note    Patient Details  Name: Neil [name] MRN: [id number] Date of Birth: 08-Nov-1940  Transition [facility] Care [facility]) CM/SW Contact:    Leeroy Cha, RN Phone Number: 09/05/2021, 7:36 AM  Clinical Narrative:                 80 y.o. male with history of diabetes mellitus type 2 last hemoglobin A1c was 7.4 in August of this year, hypertension, hyperlipidemia presents to the ER because of weakness and patient's family found that he was confused and has been having persistent diarrhea for the last 1 week which is nonbloody and watery and unable to urinate for the last 1 week.  Denies nausea vomiting or fever chills.  Denies using any antibiotics recently.   ED Course: In the ER patient abdomen is distended and CT scan shows bladder outlet obstruction with large prostate.  Bilateral mild hydronephrosis and perinephric and stranding around the bladder.  Also shows sigmoid wall thickening concerning for colitis.  Patient's labs show acute renal failure with creatinine worsening from 1.3 it is around 2.4 now with blood glucose of 254.  Patient had Foley catheter placed following which patient abdominal pain has largely improved but is still generally weak and requiring to patient assist to ambulate.  COVID test was negative.  TOC PLAN OF CARE:  From home with self care.  Has a dtg in the area for support. Will follow for toc needs PROGRESSION: On room air, iv l.r. at 100cc/hr, bun=45 creat'.=1.66, albumin down at 2.8 and total protein down at 5.9 Expected Discharge Plan: Home/Self Care Barriers to Discharge: Continued Medical Work up   Patient Goals and CMS Choice Patient states their goals for this hospitalization and ongoing recovery are:: to go home CMS Medicare.gov Compare Post Acute Care list provided to:: Patient    Expected Discharge Plan and Services Expected Discharge Plan: Home/Self Care   Discharge Planning Services: CM Consult    Living arrangements for the past 2 months: Single Family Home                                      Prior Living Arrangements/Services Living arrangements for the past 2 months: Single Family Home Lives with:: Self Patient language and need for interpreter reviewed:: Yes Do you feel safe going back to the place where you live?: Yes            Criminal Activity/Legal Involvement Pertinent to Current Situation/Hospitalization: No - Comment as needed  Activities of Daily Living Home Assistive Devices/Equipment: Eyeglasses, Cane (specify quad or straight), Shower chair with back ADL Screening (condition at time of admission) Patient's cognitive ability adequate to safely complete daily activities?: Yes Is the patient deaf or have difficulty hearing?: No Does the patient have difficulty seeing, even when wearing glasses/contacts?: No Does the patient have difficulty concentrating, remembering, or making decisions?: Yes (slighty) Patient able to express need for assistance with ADLs?: Yes Does the patient have difficulty dressing or bathing?: Yes Independently performs ADLs?: No Communication: Independent Dressing (OT): Needs assistance Is this a change from baseline?: Pre-admission baseline Grooming: Independent Feeding: Independent Bathing: Needs assistance Is this a change from baseline?: Pre-admission baseline Toileting: Needs assistance Is this a change from baseline?: Pre-admission baseline In/Out Bed: Needs assistance Is this a change from baseline?: Pre-admission baseline Walks in Home: Dependent Is this a change from  baseline?: Pre-admission baseline Does the patient have difficulty walking or climbing stairs?: Yes Weakness of Legs: Both Weakness of Arms/Hands: Both  Permission Sought/Granted                  Emotional Assessment Appearance:: Appears stated age Attitude/Demeanor/Rapport: Engaged Affect (typically observed): Calm Orientation: :  Oriented to Place, Oriented to Self, Oriented to  Time, Oriented to Situation Alcohol / Substance Use: Not Applicable Psych Involvement: No (comment)  Admission diagnosis:  Dehydration [E86.0] Urinary retention [R33.9] ARF (acute renal failure) (HCC) [N17.9] Generalized weakness [R53.1] AKI (acute kidney injury) ([place]) [N17.9] Patient Active Problem List   Diagnosis Date Noted   ARF (acute renal failure) ([place]) 09/04/2021   Diabetes mellitus type 2 in nonobese ([place]) 09/04/2021   Generalized weakness    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   Low back pain 11/27/2020   Dyspnea on exertion 05/24/2020   Tremor 11/25/2019   Vitamin D deficiency 11/25/2019   Depression 05/28/2019   Bursitis 05/28/2018   CKD (chronic kidney disease) stage 3, GFR 30-59 ml/min (HCC) 08/02/2017   Increased prostate specific antigen (PSA) velocity 08/02/2017   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   Coronary artery calcification seen on CT scan 03/07/2016   Eosinophilia 12/30/2015   Hepatic abscess    Streptococcal infection    Loss of appetite    Normocytic anemia 05/24/2015   Malnutrition of moderate degree ([place]) 05/24/2015   Essential hypertension, benign    Pyogenic hepatic abscess    Gall stones, common bile duct    Calculus of bile duct with obstruction and without cholangitis or cholecystitis    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   Calculus of bile duct with acute cholangitis with obstruction    Gall stones 02/13/2015   Abnormal urine 02/08/2015   Skin lesion of back 12/12/2012   Hypertriglyceridemia 11/14/2011   Encounter for well adult exam with abnormal findings 11/11/2011   INGUINAL HERNIA, RIGHT, SMALL 11/06/2010   Disorder of bursae and tendons in shoulder region 11/06/2010   BUNDLE BRANCH BLOCK, RIGHT 07/21/2009   DIVERTICULOSIS, COLON 07/21/2009   Diabetes ([place]) 12/04/2007   ALLERGIC RHINITIS 12/04/2007   Hyperlipidemia 06/05/2007   ERECTILE  DYSFUNCTION 06/05/2007   BENIGN PROSTATIC HYPERTROPHY 06/05/2007   COLONIC POLYPS, HX OF 06/05/2007   PCP:  Biagio Borg, MD Pharmacy:   [id number] - [place], [place] [place] [address] Phone: [phone] Fax: [phone]  [facility] - [place], [place] - [place] [address] Phone: [phone] Fax: [phone]     Social Determinants of Health (SDOH) Interventions    Readmission Risk Interventions No flowsheet data found.

## 2021-09-05 NOTE — Progress Notes (Signed)
PT Cancellation Note  Patient Details Name: CHAEL URENDA MRN: [id number] DOB: October 06, 1941   Cancelled Treatment:    Reason Eval/Treat Not Completed: Attempted PT eval-pt eating lunch. Will check back as schedule allows. Thanks.   Irene Limbo, PT Acute Rehabilitation  Office: [phone] Pager: [phone]

## 2021-09-06 DIAGNOSIS — D649 Anemia, unspecified: Secondary | ICD-10-CM | POA: Diagnosis not present

## 2021-09-06 DIAGNOSIS — N1330 Unspecified hydronephrosis: Secondary | ICD-10-CM | POA: Diagnosis not present

## 2021-09-06 DIAGNOSIS — K625 Hemorrhage of anus and rectum: Secondary | ICD-10-CM | POA: Diagnosis not present

## 2021-09-06 DIAGNOSIS — F05 Delirium due to known physiological condition: Secondary | ICD-10-CM | POA: Diagnosis not present

## 2021-09-06 DIAGNOSIS — Z87891 Personal history of nicotine dependence: Secondary | ICD-10-CM | POA: Diagnosis not present

## 2021-09-06 DIAGNOSIS — Z20822 Contact with and (suspected) exposure to covid-19: Secondary | ICD-10-CM | POA: Diagnosis not present

## 2021-09-06 DIAGNOSIS — Z86010 Personal history of colonic polyps: Secondary | ICD-10-CM | POA: Diagnosis not present

## 2021-09-06 DIAGNOSIS — E43 Unspecified severe protein-calorie malnutrition: Secondary | ICD-10-CM | POA: Diagnosis not present

## 2021-09-06 DIAGNOSIS — K5731 Diverticulosis of large intestine without perforation or abscess with bleeding: Secondary | ICD-10-CM | POA: Diagnosis not present

## 2021-09-06 DIAGNOSIS — N179 Acute kidney failure, unspecified: Secondary | ICD-10-CM | POA: Diagnosis not present

## 2021-09-06 DIAGNOSIS — E785 Hyperlipidemia, unspecified: Secondary | ICD-10-CM | POA: Diagnosis present

## 2021-09-06 DIAGNOSIS — Z8719 Personal history of other diseases of the digestive system: Secondary | ICD-10-CM | POA: Diagnosis not present

## 2021-09-06 DIAGNOSIS — Z6826 Body mass index (BMI) 26.0-26.9, adult: Secondary | ICD-10-CM | POA: Diagnosis not present

## 2021-09-06 DIAGNOSIS — I4891 Unspecified atrial fibrillation: Secondary | ICD-10-CM | POA: Diagnosis not present

## 2021-09-06 DIAGNOSIS — R338 Other retention of urine: Secondary | ICD-10-CM | POA: Diagnosis not present

## 2021-09-06 DIAGNOSIS — K922 Gastrointestinal hemorrhage, unspecified: Secondary | ICD-10-CM

## 2021-09-06 DIAGNOSIS — E860 Dehydration: Secondary | ICD-10-CM | POA: Diagnosis present

## 2021-09-06 DIAGNOSIS — E1165 Type 2 diabetes mellitus with hyperglycemia: Secondary | ICD-10-CM | POA: Diagnosis not present

## 2021-09-06 DIAGNOSIS — Z7984 Long term (current) use of oral hypoglycemic drugs: Secondary | ICD-10-CM | POA: Diagnosis not present

## 2021-09-06 DIAGNOSIS — K5289 Other specified noninfective gastroenteritis and colitis: Secondary | ICD-10-CM | POA: Diagnosis not present

## 2021-09-06 DIAGNOSIS — I129 Hypertensive chronic kidney disease with stage 1 through stage 4 chronic kidney disease, or unspecified chronic kidney disease: Secondary | ICD-10-CM | POA: Diagnosis not present

## 2021-09-06 DIAGNOSIS — R351 Nocturia: Secondary | ICD-10-CM | POA: Diagnosis not present

## 2021-09-06 DIAGNOSIS — N1832 Chronic kidney disease, stage 3b: Secondary | ICD-10-CM | POA: Diagnosis not present

## 2021-09-06 DIAGNOSIS — K648 Other hemorrhoids: Secondary | ICD-10-CM | POA: Diagnosis present

## 2021-09-06 DIAGNOSIS — K529 Noninfective gastroenteritis and colitis, unspecified: Secondary | ICD-10-CM | POA: Diagnosis present

## 2021-09-06 DIAGNOSIS — E1122 Type 2 diabetes mellitus with diabetic chronic kidney disease: Secondary | ICD-10-CM | POA: Diagnosis not present

## 2021-09-06 DIAGNOSIS — N320 Bladder-neck obstruction: Secondary | ICD-10-CM | POA: Diagnosis present

## 2021-09-06 DIAGNOSIS — E876 Hypokalemia: Secondary | ICD-10-CM | POA: Diagnosis present

## 2021-09-06 DIAGNOSIS — N401 Enlarged prostate with lower urinary tract symptoms: Secondary | ICD-10-CM | POA: Diagnosis present

## 2021-09-06 LAB — CBC
HCT: 39.4 % (ref 39.0–52.0)
Hemoglobin: 12.6 g/dL — ABNORMAL LOW (ref 13.0–17.0)
MCH: 28.1 pg (ref 26.0–34.0)
MCHC: 32 g/dL (ref 30.0–36.0)
MCV: 87.9 fL (ref 80.0–100.0)
Platelets: 168 10*3/uL (ref 150–400)
RBC: 4.48 MIL/uL (ref 4.22–5.81)
RDW: 13.6 % (ref 11.5–15.5)
WBC: 5.2 10*3/uL (ref 4.0–10.5)
nRBC: 0 % (ref 0.0–0.2)

## 2021-09-06 LAB — GASTROINTESTINAL PANEL BY PCR, STOOL (REPLACES STOOL CULTURE)

## 2021-09-06 LAB — GLUCOSE, CAPILLARY
Glucose-Capillary: 250 mg/dL — ABNORMAL HIGH (ref 70–99)
Glucose-Capillary: 94 mg/dL (ref 70–99)
Glucose-Capillary: 158 mg/dL — ABNORMAL HIGH (ref 70–99)
Glucose-Capillary: 236 mg/dL — ABNORMAL HIGH (ref 70–99)

## 2021-09-06 LAB — CBC WITH DIFFERENTIAL/PLATELET
Abs Immature Granulocytes: 0.01 10*3/uL (ref 0.00–0.07)
Basophils Absolute: 0 10*3/uL (ref 0.0–0.1)
Basophils Relative: 0 %
Eosinophils Absolute: 0.2 10*3/uL (ref 0.0–0.5)
Eosinophils Relative: 4 %
HCT: 38.2 % — ABNORMAL LOW (ref 39.0–52.0)
Hemoglobin: 12.5 g/dL — ABNORMAL LOW (ref 13.0–17.0)
Immature Granulocytes: 0 %
Lymphocytes Relative: 24 %
Lymphs Abs: 1.1 10*3/uL (ref 0.7–4.0)
MCH: 28.4 pg (ref 26.0–34.0)
MCHC: 32.7 g/dL (ref 30.0–36.0)
MCV: 86.8 fL (ref 80.0–100.0)
Monocytes Absolute: 0.6 10*3/uL (ref 0.1–1.0)
Monocytes Relative: 13 %
Neutro Abs: 2.7 10*3/uL (ref 1.7–7.7)
Neutrophils Relative %: 59 %
Platelets: 160 10*3/uL (ref 150–400)
RBC: 4.4 MIL/uL (ref 4.22–5.81)
RDW: 13.6 % (ref 11.5–15.5)
WBC: 4.6 10*3/uL (ref 4.0–10.5)
nRBC: 0 % (ref 0.0–0.2)

## 2021-09-06 LAB — COMPREHENSIVE METABOLIC PANEL
ALT: 16 U/L (ref 0–44)
AST: 12 U/L — ABNORMAL LOW (ref 15–41)
Albumin: 2.7 g/dL — ABNORMAL LOW (ref 3.5–5.0)
Alkaline Phosphatase: 29 U/L — ABNORMAL LOW (ref 38–126)
Anion gap: 8 (ref 5–15)
BUN: 28 mg/dL — ABNORMAL HIGH (ref 8–23)
CO2: 26 mmol/L (ref 22–32)
Calcium: 8.7 mg/dL — ABNORMAL LOW (ref 8.9–10.3)
Chloride: 107 mmol/L (ref 98–111)
Creatinine, Ser: 1.35 mg/dL — ABNORMAL HIGH (ref 0.61–1.24)
GFR, Estimated: 53 mL/min — ABNORMAL LOW (ref >60)
Glucose, Bld: 163 mg/dL — ABNORMAL HIGH (ref 70–99)
Potassium: 3 mmol/L — ABNORMAL LOW (ref 3.5–5.1)
Sodium: 141 mmol/L (ref 135–145)
Total Bilirubin: 0.4 mg/dL (ref 0.3–1.2)
Total Protein: 6.2 g/dL — ABNORMAL LOW (ref 6.5–8.1)

## 2021-09-06 LAB — T4, FREE: Free T4: 1.3 ng/dL — ABNORMAL HIGH (ref 0.61–1.12)

## 2021-09-06 LAB — PHOSPHORUS: Phosphorus: 2.3 mg/dL — ABNORMAL LOW (ref 2.5–4.6)

## 2021-09-06 LAB — MAGNESIUM
Magnesium: 1.4 mg/dL — ABNORMAL LOW (ref 1.7–2.4)
Magnesium: 1.5 mg/dL — ABNORMAL LOW (ref 1.7–2.4)

## 2021-09-06 MED ORDER — ENSURE ENLIVE PO LIQD
237.0000 mL | Freq: Two times a day (BID) | ORAL | Status: DC
Start: 2021-09-06 — End: 2021-09-11
  Administered 2021-09-06 – 2021-09-09 (×5): 237 mL via ORAL

## 2021-09-06 MED ORDER — PROSOURCE PLUS PO LIQD
30.0000 mL | Freq: Three times a day (TID) | ORAL | Status: DC
  Administered 2021-09-06 – 2021-09-11 (×10): 30 mL via ORAL
  Filled 2021-09-06 (×11): qty 30

## 2021-09-06 MED ORDER — ADULT MULTIVITAMIN W/MINERALS CH
1.0000 | ORAL_TABLET | Freq: Every day | ORAL | Status: DC
  Administered 2021-09-06 – 2021-09-11 (×5): 1 via ORAL
  Filled 2021-09-06 (×6): qty 1

## 2021-09-06 MED ORDER — POTASSIUM CHLORIDE CRYS ER 20 MEQ PO TBCR
40.0000 meq | EXTENDED_RELEASE_TABLET | ORAL | Status: AC
  Administered 2021-09-06 (×2): 40 meq via ORAL
  Filled 2021-09-06 (×2): qty 2

## 2021-09-06 MED ORDER — HYDROCORTISONE (PERIANAL) 2.5 % EX CREA
TOPICAL_CREAM | Freq: Two times a day (BID) | CUTANEOUS | Status: DC
  Filled 2021-09-06: qty 28.35

## 2021-09-06 NOTE — Progress Notes (Signed)
PROGRESS NOTE    Neil [name]   CNO:[id number]  DOB: 1941-05-30  DOA: 09/04/2021 PCP: Biagio Borg, MD   Brief Narrative:  Neil Ledger IIis a 80 year old elderly Caucasian male with a past medical history significant for but limited to diabetes mellitus type 2 with last hemoglobin A1c of 7.4 in August, hypertension, hyperlipidemia as well as other comorbidities including BPH, history of bursitis, CKD stage III, history of colonic polyps, history of CAD seen on CT scan as well as other comorbidities who presented to the hospital because of weakness and confusion that has been persistent.   He states that typically urinates every night but about a week ago, he stopped urinating at night and has subsequently just been "dribbling urine all over the place".  He subsequently also began to have diarrhea. In the ED he was found to have bladder outlet obstruction and an enlarged prostate.  CT scan was also suggestive of possible mild sigmoid wall thickening.  He had no leukocytosis.    BUN 58 creatinine 2.44 A Foley catheter was placed to help relieve obstruction and BUN and creatinine have subsequently been improving.  Subjective: He states he is extremely weak today.  RN has noted that he has had 2 episodes of passing blood via rectum today.  The patient has no complaints of abdominal pain nausea or vomiting.    Assessment & Plan:   Principal Problem:   ARF (acute renal failure) (HCC)-CKD stage IIIb -Baseline creatinine is 1.35 and GFR is around 45-50 -Creatinine has been improving ever since Foley catheter was placed  Active Problems: Bladder outlet obstruction-possible BPH - Foley placed as mentioned above -CT scan revealed a markedly enlarged prostate gland mild bilateral hydronephrosis with perinephric and perivesical edema - Proscar started - The patient already takes terazosin at home and this was continued at a dose of 2 mg - He will need at least 5 to 7 days of these  above-mentioned medications prior to giving a voiding trial - I have spoken with urology (Dr Neil [name]) who recommends an outpatient follow-up -  I have also asked his nurse to help obtain this appointment  GI bleed- acute- lower - 2 episodes of bleeding per rectum today - We will obtain a hemoglobin stat and we will transition him to the stepdown unit - change solid diet to clear liquid diet - GI has been following this patient for his diarrhea and "mild colitis" noted on CT-I have notified GI of his bleeding - The patient is only on heparin for DVT prophylaxis and no other anticoagulation-we will hold heparin in place SCDs  Diarrhea - presented with nonbloody diarrhea-possible sigmoid colitis per CT-GI following and ordered stool for GI pathogens  Hypokalemia - K 3.-0 - replacing - f/u Mg    Essential hypertension, benign -Continue Toprol 50 mg daily -Benazepril on hold in setting of AKI    Diabetes mellitus type 2 in nonobese (HCC) -Continue sliding scale insulin -Glucophage on hold    Generalized weakness  Abnormal thyroid function test - TSH noted to be 1.60, free T4 1.30 - Recommend these tests be rechecked as outpatient in 1 month   Time spent in minutes: 35 DVT prophylaxis: SCDs Code Status: full code Family Communication: brother at bedside Level of Care: Level of care: Telemetry Disposition Plan:  Status is: inpateint  The patient will require care spanning > 2 midnights and should be moved to inpatient because: GI bleed     Consultants:  GI  Phone call with urology Procedures:  Foley cath Antimicrobials:  Anti-infectives (From admission, onward)    None        Objective: Vitals:   09/05/21 1001 09/05/21 1300 09/05/21 2130 09/06/21 0600  BP: (!) 143/61 (!) 150/65 (!) 153/65 (!) 167/62  Pulse: 83 79 72 (!) 54  Resp: 20 20 18    Temp: 99 F (37.2 C) 99.3 F (37.4 C) 98.7 F (37.1 C) 98.8 F (37.1 C)  TempSrc: Oral Oral Oral Oral  SpO2: 96%  95% 94% 94%  Weight:      Height:        Intake/Output Summary (Last 24 hours) at 09/06/2021 1335 Last data filed at 09/06/2021 1000 Gross per 24 hour  Intake 480 ml  Output 2175 ml  Net -1695 ml   Filed Weights   09/04/21 1718 09/04/21 2201 09/05/21 0955  Weight: 79 kg 69.7 kg 71.2 kg    Examination: General exam: Appears comfortable  HEENT: PERRLA, oral mucosa moist, no sclera icterus or thrush Respiratory system: Clear to auscultation. Respiratory effort normal. Cardiovascular system: S1 & S2 heard, RRR.   Gastrointestinal system: Abdomen soft, non-tender, nondistended. Normal bowel sounds. Central nervous system: Alert and oriented. No focal neurological deficits. Extremities: No cyanosis, clubbing or edema Skin: No rashes or ulcers Psychiatry:  Mood & affect appropriate.     Data Reviewed: I have personally reviewed following labs and imaging studies  CBC: Recent Labs  Lab 09/04/21 1734 09/05/21 0330 09/06/21 0805  WBC 7.3 5.3 4.6  NEUTROABS 5.6 3.5 2.7  HGB 14.2 12.6* 12.5*  HCT 43.4 38.9* 38.2*  MCV 87.0 88.6 86.8  PLT 202 164 885   Basic Metabolic Panel: Recent Labs  Lab 09/04/21 1734 09/05/21 0330 09/06/21 0805  NA 140 141 141  K 3.6 3.3* 3.0*  CL 103 109 107  CO2 26 25 26   GLUCOSE 254* 217* 163*  BUN 58* 45* 28*  CREATININE 2.44* 1.66* 1.35*  CALCIUM 9.9 8.9 8.7*  MG  --   --  1.5*  1.4*  PHOS  --   --  2.3*   GFR: Estimated Creatinine Clearance: 42.9 mL/min (A) (by C-G formula based on SCr of 1.35 mg/dL (H)). Liver Function Tests: Recent Labs  Lab 09/04/21 1734 09/05/21 0330 09/06/21 0805  AST 14* 11* 12*  ALT 22 18 16   ALKPHOS 40 32* 29*  BILITOT 0.8 0.8 0.4  PROT 7.8 5.9* 6.2*  ALBUMIN 3.6 2.8* 2.7*   No results for input(s): LIPASE, AMYLASE in the last 168 hours. No results for input(s): AMMONIA in the last 168 hours. Coagulation Profile: No results for input(s): INR, PROTIME in the last 168 hours. Cardiac  Enzymes: Recent Labs  Lab 09/05/21 0330  CKTOTAL 45*   BNP (last 3 results) No results for input(s): PROBNP in the last 8760 hours. HbA1C: No results for input(s): HGBA1C in the last 72 hours. CBG: Recent Labs  Lab 09/05/21 1119 09/05/21 1631 09/05/21 2130 09/06/21 0918 09/06/21 1206  GLUCAP 142* 147* 188* 250* 94   Lipid Profile: No results for input(s): CHOL, HDL, LDLCALC, TRIG, CHOLHDL, LDLDIRECT in the last 72 hours. Thyroid Function Tests: Recent Labs    09/05/21 0330 09/06/21 0805  TSH 0.160*  --   FREET4  --  1.30*   Anemia Panel: No results for input(s): VITAMINB12, FOLATE, FERRITIN, TIBC, IRON, RETICCTPCT in the last 72 hours. Urine analysis:    Component Value Date/Time   COLORURINE YELLOW 09/04/2021 [place] 09/04/2021  1732   LABSPEC 1.009 09/04/2021 1732   PHURINE 5.0 09/04/2021 1732   GLUCOSEU >=500 (A) 09/04/2021 1732   GLUCOSEU 100 (A) 11/20/2019 1100   HGBUR MODERATE (A) 09/04/2021 1732   BILIRUBINUR NEGATIVE 09/04/2021 1732   KETONESUR NEGATIVE 09/04/2021 1732   PROTEINUR 30 (A) 09/04/2021 1732   UROBILINOGEN 0.2 11/20/2019 1100   NITRITE NEGATIVE 09/04/2021 1732   LEUKOCYTESUR NEGATIVE 09/04/2021 1732   Sepsis Labs: @LABRCNTIP (procalcitonin:4,lacticidven:4) ) Recent Results (from the past 240 hour(s))  Urine Culture     Status: None   Collection Time: 09/04/21  5:32 PM   Specimen: Urine, Catheterized  Result Value Ref Range Status   Specimen Description   Final    URINE, CATHETERIZED Performed at [facility], [place] [address], [place], [place] [address]    Special Requests   Final    NONE Performed at [facility], [place] [address] [place], [place] [address]    Culture   Final    NO GROWTH Performed at [facility], [place] [address] [place], [place] [address]    Report Status 09/05/2021 FINAL  Final  Resp Panel by RT-PCR (Flu A&B, Covid) Nasopharyngeal Swab     Status:  None   Collection Time: 09/04/21  9:15 PM   Specimen: Nasopharyngeal Swab; Nasopharyngeal(NP) swabs in vial transport medium  Result Value Ref Range Status   SARS Coronavirus 2 by RT PCR NEGATIVE NEGATIVE Final    Comment: (NOTE) SARS-CoV-2 target nucleic acids are NOT DETECTED.  The SARS-CoV-2 RNA is generally detectable in upper respiratory specimens during the acute phase of infection. The lowest concentration of SARS-CoV-2 viral copies this assay can detect is 138 copies/mL. A negative result does not preclude SARS-Cov-2 infection and should not be used as the sole basis for treatment or other patient management decisions. A negative result may occur with  improper specimen collection/handling, submission of specimen other than nasopharyngeal swab, presence of viral mutation(s) within the areas targeted by this assay, and inadequate number of viral copies(<138 copies/mL). A negative result must be combined with clinical observations, patient history, and epidemiological information. The expected result is Negative.  Fact Sheet for Patients:  EntrepreneurPulse.com.au  Fact Sheet for Healthcare Providers:  IncredibleEmployment.be  This test is no t yet approved or cleared by the [place] FDA and  has been authorized for detection and/or diagnosis of SARS-CoV-2 by FDA under an Emergency Use Authorization (EUA). This EUA will remain  in effect (meaning this test can be used) for the duration of the COVID-19 declaration under Section 564(b)(1) of the Act, 21 U.S.C.section 360bbb-3(b)(1), unless the authorization is terminated  or revoked sooner.       Influenza A by PCR NEGATIVE NEGATIVE Final   Influenza B by PCR NEGATIVE NEGATIVE Final    Comment: (NOTE) The Xpert Xpress SARS-CoV-2/FLU/RSV plus assay is intended as an aid in the diagnosis of influenza from Nasopharyngeal swab specimens and should not be used as a sole basis for  treatment. Nasal washings and aspirates are unacceptable for Xpert Xpress SARS-CoV-2/FLU/RSV testing.  Fact Sheet for Patients: EntrepreneurPulse.com.au  Fact Sheet for Healthcare Providers: IncredibleEmployment.be  This test is not yet approved or cleared by the [place] FDA and has been authorized for detection and/or diagnosis of SARS-CoV-2 by FDA under an Emergency Use Authorization (EUA). This EUA will remain in effect (meaning this test can be used) for the duration of the COVID-19 declaration under Section 564(b)(1) of the Act, 21 U.S.C. section  360bbb-3(b)(1), unless the authorization is terminated or revoked.  Performed at [facility], [place] [address] [place], [place] [address]   Gastrointestinal Panel by PCR , Stool     Status: None   Collection Time: 09/05/21  1:45 PM   Specimen: Stool  Result Value Ref Range Status   Campylobacter species NOT DETECTED NOT DETECTED Final   Plesimonas shigelloides NOT DETECTED NOT DETECTED Final   Salmonella species NOT DETECTED NOT DETECTED Final   Yersinia enterocolitica NOT DETECTED NOT DETECTED Final   Vibrio species NOT DETECTED NOT DETECTED Final   Vibrio cholerae NOT DETECTED NOT DETECTED Final   Enteroaggregative E coli (EAEC) NOT DETECTED NOT DETECTED Final   Enteropathogenic E coli (EPEC) NOT DETECTED NOT DETECTED Final   Enterotoxigenic E coli (ETEC) NOT DETECTED NOT DETECTED Final   Shiga like toxin producing E coli (STEC) NOT DETECTED NOT DETECTED Final   Shigella/Enteroinvasive E coli (EIEC) NOT DETECTED NOT DETECTED Final   Cryptosporidium NOT DETECTED NOT DETECTED Final   Cyclospora cayetanensis NOT DETECTED NOT DETECTED Final   Entamoeba histolytica NOT DETECTED NOT DETECTED Final   Giardia lamblia NOT DETECTED NOT DETECTED Final   Adenovirus F40/41 NOT DETECTED NOT DETECTED Final   Astrovirus NOT DETECTED NOT DETECTED Final   Norovirus GI/GII NOT DETECTED  NOT DETECTED Final   Rotavirus A NOT DETECTED NOT DETECTED Final   Sapovirus (I, [name], IV, and V) NOT DETECTED NOT DETECTED Final    Comment: Performed at [facility], [address] [place], [place] [address]         Radiology Studies: CT ABDOMEN PELVIS WO CONTRAST  Result Date: 09/04/2021 CLINICAL DATA:  Abdominal distension and pain. Acute urinary retention. EXAM: CT ABDOMEN AND PELVIS WITHOUT CONTRAST TECHNIQUE: Multidetector CT imaging of the abdomen and pelvis was performed following the standard protocol without IV contrast. COMPARISON:  CT 06/29/2015 FINDINGS: Lower chest: No acute airspace disease or pleural effusion. Upper normal heart size with coronary artery calcifications. Hepatobiliary: Focal fatty infiltration adjacent to the falciform ligament. No suspicious liver lesion on this unenhanced exam. Pneumobilia in the left lobe, unchanged. Cholecystectomy. Normal caliber common bile duct. Pancreas: No ductal dilatation or inflammation. Spleen: Normal in size without focal abnormality. Adrenals/Urinary Tract: No adrenal nodule. Mild bilateral hydronephrosis. Moderate bilateral perinephric edema, symmetric. No renal or ureteral calculi. Right retroperitoneal calcifications are adjacent to the right ureter. No evidence of focal renal lesion on this unenhanced exam. Foley catheter decompresses the urinary bladder. There is mild perivesicular edema. Stomach/Bowel: Mild wall thickening of the mid sigmoid colon, series 2, image 50, but no pericolonic edema or inflammation. Moderate volume of colonic stool. No small bowel obstruction or inflammatory change. Normal appendix. Stomach is partially distended. Vascular/Lymphatic: Moderate aortic and branch atherosclerosis. No portal venous or mesenteric gas. Suspected retroaortic left renal vein. No bulky abdominopelvic adenopathy. Reproductive: Markedly enlarged prostate gland. Prostate measures 6.6 x 7.7 x 7.3 cm (volume = 190 cm^3). Other:  Fat containing left inguinal hernia. Minimal fat in the right inguinal canal. No abdominal ascites or free air. Musculoskeletal: Unilateral left L5 pars defects. Multilevel degenerative change in the spine. There are no acute or suspicious osseous abnormalities. IMPRESSION: 1. Markedly enlarged prostate gland. Mild bilateral hydronephrosis with perinephric and perivesicular edema. Findings may be secondary to chronic bladder outlet obstruction or urinary tract infection. 2. Mild wall thickening of the mid sigmoid colon, but no pericolonic edema or inflammation. Recommend up-to-date colonoscopy. Aortic Atherosclerosis (ICD10-I70.0). Electronically Signed   By:  Keith Rake M.D.   On: 09/04/2021 19:06   DG Chest Portable 1 View  Result Date: 09/04/2021 CLINICAL DATA:  Weakness altered EXAM: PORTABLE CHEST 1 VIEW COMPARISON:  CT 03/02/2016 FINDINGS: The heart size and mediastinal contours are within normal limits. Both lungs are clear. The visualized skeletal structures are unremarkable. IMPRESSION: No active disease. Electronically Signed   By: Donavan Foil M.D.   On: 09/04/2021 18:42      Scheduled Meds:  Chlorhexidine Gluconate Cloth  6 each Topical Daily   finasteride  5 mg Oral Daily   heparin  5,000 Units Subcutaneous Q8H   insulin aspart  0-9 Units Subcutaneous TID WC   metoprolol succinate  50 mg Oral Daily   potassium chloride  40 mEq Oral Q4H   terazosin  2 mg Oral Daily   Continuous Infusions:   LOS: 0 days      Debbe Odea, MD [facility] Pager: [email or web address] 09/06/2021, 1:35 PM

## 2021-09-06 NOTE — Evaluation (Addendum)
Occupational Therapy Evaluation Patient Details Name: Neil [name] MRN: [id number] DOB: 12-19-1940 Today's Date: 09/06/2021   History of Present Illness 80 yo male admitted with ARF, weakness, sigmoid colitis. Hx of DM, RBBB, CKD   Clinical Impression   This 80 yo male admitted with above presents to acute OT with PLOF of being totally independent with basic ADLs, IADLs, and driving. He currently is seup/S-Mod A for basic ADLs. He will continue to benefit from acute OT with follow up at SNF.      Recommendations for follow up therapy are one component of a multi-disciplinary discharge planning process, led by the attending physician.  Recommendations may be updated based on patient status, additional functional criteria and insurance authorization.   Follow Up Recommendations  Skilled nursing-short term rehab (<3 hours/day)    Assistance Recommended at Discharge Frequent or constant Supervision/Assistance  Functional Status Assessment  Patient has had a recent decline in their functional status and demonstrates the ability to make significant improvements in function in a reasonable and predictable amount of time.  Equipment Recommendations  Other (comment) (TBD next venue)       Precautions / Restrictions Precautions Precautions: Fall Restrictions Weight Bearing Restrictions: No      Mobility Bed Mobility Overal bed mobility: Needs Assistance Bed Mobility: Rolling;Sidelying to Sit Rolling: Supervision Sidelying to sit: Min assist;HOB elevated       General bed mobility comments: increased time, VCs for sequencing    Transfers Overall transfer level: Needs assistance Equipment used: Rolling walker (2 wheels) Transfers: Sit to/from Stand Sit to Stand: Min assist Stand pivot transfers: Min assist                Balance Overall balance assessment: Needs assistance Sitting-balance support: No upper extremity supported;Feet supported Sitting balance-Leahy  Scale: Fair     Standing balance support: Bilateral upper extremity supported;Reliant on assistive device for balance Standing balance-Leahy Scale: Poor                             ADL either performed or assessed with clinical judgement   ADL Overall ADL's : Needs assistance/impaired Eating/Feeding: Independent;Sitting   Grooming: Set up;Sitting   Upper Body Bathing: Set up;Sitting   Lower Body Bathing: Minimal assistance;Sit to/from stand   Upper Body Dressing : Set up;Sitting   Lower Body Dressing: Moderate assistance Lower Body Dressing Details (indicate cue type and reason): min A sit<>stand Toilet Transfer: Minimal assistance;Stand-pivot;Rolling walker (2 wheels) Toilet Transfer Details (indicate cue type and reason): bed>recliner Toileting- Clothing Manipulation and Hygiene: Moderate assistance Toileting - Clothing Manipulation Details (indicate cue type and reason): min A sit<>stand             Vision Baseline Vision/History: 1 Wears glasses Ability to See in Adequate Light: 0 Adequate Patient Visual Report: No change from baseline              Pertinent Vitals/Pain Pain Assessment: No/denies pain     Hand Dominance Right   Extremity/Trunk Assessment Upper Extremity Assessment Upper Extremity Assessment: Overall WFL for tasks assessed           Communication Communication Communication: No difficulties   Cognition Arousal/Alertness: Awake/alert Behavior During Therapy: WFL for tasks assessed/performed Overall Cognitive Status: No family/caregiver present to determine baseline cognitive functioning  General Comments: tangental conversations                Home Living Family/patient expects to be discharged to:: [place]: Alone   Type of Home: House Home Access: Level entry     [place]: One level     Bathroom Shower/Tub: Tub/shower  unit;Curtain   Biochemist, clinical: [place]: Conservation officer, nature (2 wheels);Tub bench;Cane - single point;Transport chair          Prior Functioning/Environment Prior Level of Function : Independent/Modified Independent;Driving             Mobility Comments: using cane for ambulation          OT Problem List: Impaired balance (sitting and/or standing);Decreased cognition      OT Treatment/Interventions: Self-care/ADL training;DME and/or AE instruction;Patient/family education;Balance training    OT Goals(Current goals can be found in the care plan section) Acute Rehab OT Goals Patient Stated Goal: to get stronger before he can go home OT Goal Formulation: With patient Time For Goal Achievement: 09/14/21 Potential to Achieve Goals: Good ADL Goals Pt Will Perform Grooming: Independently;standing Pt Will Perform Upper Body Bathing: Independently;sitting;standing Pt Will Perform Lower Body Bathing: Independently;sit to/from stand Pt Will Perform Upper Body Dressing: Independently;sitting;standing Pt Will Perform Lower Body Dressing: Independently;sit to/from stand Pt Will Transfer to Toilet: Independently;ambulating;regular height toilet Pt Will Perform Toileting - Clothing Manipulation and hygiene: Independently;sit to/from stand Additional ADL Goal #1: Pt will be independent in and OOB for basic ADLs  OT Frequency: Min 2X/week   Barriers to D/C: Decreased caregiver support             AM-PAC OT "6 Clicks" Daily Activity     Outcome Measure Help from another person eating meals?: None Help from another person taking care of personal grooming?: A Little Help from another person toileting, which includes using toliet, bedpan, or urinal?: A Lot Help from another person bathing (including washing, rinsing, drying)?: A Little Help from another person to put on and taking off regular upper body clothing?: A Little Help from another person to put on and  taking off regular lower body clothing?: A Lot 6 Click Score: 17   End of Session Equipment Utilized During Treatment: Gait belt;Rolling walker (2 wheels) Nurse Communication: Mobility status (to NT)  Activity Tolerance: Patient tolerated treatment well Patient left: in chair;with call bell/phone within reach;with chair alarm set  OT Visit Diagnosis: Unsteadiness on feet (R26.81);Other abnormalities of gait and mobility (R26.89)                Time: 8466-5993 OT Time Calculation (min): 30 min Charges:  OT General Charges $OT Visit: 1 Visit OT Evaluation $OT Eval Moderate Complexity: 1 Mod OT Treatments $Self Care/Home Management : 8-22 mins  [place] [address], OTR/L [facility] [employer] Pager [phone] Office [phone]    Almon Register 09/06/2021, 9:10 AM

## 2021-09-06 NOTE — Progress Notes (Addendum)
.    Progress Note Hospital Day: 3  Chief Complaint:  diarrhea and abnormal sigmoid colon on CTscan     ASSESSMENT AND PLAN   # 80 yo male admitted with weakness, urinary symptoms, AKI on CKD 3 , diarrhea ( x 1 week).  CTAP >>mild sigmoid wall thickening,  enlarged prostate , obstructive uropathy. -Creatinine improving 2.4 >> 1.66 >> 1.36 -Diarrhea and sigmoid thickening on CTAP. He had an incidental finding of active sigmoid colitis on polyp surveillance colonoscopy with biopsies in Sept 2022.  No evidence for IBD, dysplasia. DDx included SCAD vrs ischemic vrs med related vrs infection. He was asymptomatic, no further evaluation was recommended. Now with one week history of diarrhea. GI path panel negative but still awaiting C-diff to be collected ( may not get done as diarrhea has ceased)- -Ischemia seems unlikely.  No longer having diarrhea making infectious etiology seem unlikely though C-diff not yet collected.  -Considering trial of mesalamine for ? SCAD   # Low volume painless rectal bleeding today x 2 episodes. Seems unlikely related to the sigmoid findings on CT scan. Suspect perianal source in setting of recent diarrhea.  --Repeat CBC is pending --Treat empirically for hemorrhoids with Anusol cream BID.  --If bleeding continues then probable RBC scan. No CTA given renal disease --Depending on clinical course he made need flexible sigmoidoscopy Friday.     # ? Weight loss. Weight down from 173 pounds late Sept to 156 pounds.     SUBJECTIVE       No complaints. Diarrhea resolved . Had two small episodes of painless rectal bleeding this am .    OBJECTIVE      Scheduled inpatient medications:   (feeding supplement) PROSource Plus  30 mL Oral TID BM   Chlorhexidine Gluconate Cloth  6 each Topical Daily   feeding supplement  237 mL Oral BID BM   finasteride  5 mg Oral Daily   insulin aspart  0-9 Units Subcutaneous TID WC   metoprolol succinate  50 mg Oral Daily    multivitamin with minerals  1 tablet Oral Daily   potassium chloride  40 mEq Oral Q4H   terazosin  2 mg Oral Daily   Continuous inpatient infusions:  PRN inpatient medications: acetaminophen **OR** acetaminophen, hydrALAZINE  Vital signs in last 24 hours: Temp:  [98 F (36.7 C)-98.8 F (37.1 C)] 98 F (36.7 C) (11/23 1338) Pulse Rate:  [54-86] 86 (11/23 1338) Resp:  [18] 18 (11/23 1338) BP: (138-167)/(51-65) 138/51 (11/23 1338) SpO2:  [94 %-97 %] 97 % (11/23 1338) Last BM Date: 09/06/21  Intake/Output Summary (Last 24 hours) at 09/06/2021 1409 Last data filed at 09/06/2021 1000 Gross per 24 hour  Intake 240 ml  Output 2175 ml  Net -1935 ml     Physical Exam:  General: Alert male in NAD Heart:  Regular rate and rhythm. No lower extremity edema Pulmonary: Normal respiratory effort Abdomen: Soft, nondistended, nontender. Normal bowel sounds.  Neurologic: Alert and oriented Psych: Pleasant. Cooperative.   Filed Weights   09/04/21 1718 09/04/21 2201 09/05/21 0955  Weight: 79 kg 69.7 kg 71.2 kg    Intake/Output from previous day: 11/22 0701 - 11/23 0700 In: 480 [P.O.:480] Out: 2025 [Urine:2025] Intake/Output this shift: Total I/O In: 240 [P.O.:240] Out: 700 [Urine:700]    Lab Results: Recent Labs    09/04/21 1734 09/05/21 0330 09/06/21 0805  WBC 7.3 5.3 4.6  HGB 14.2 12.6* 12.5*  HCT 43.4 38.9* 38.2*  PLT 202 164  160   BMET Recent Labs    09/04/21 1734 09/05/21 0330 09/06/21 0805  NA 140 141 141  K 3.6 3.3* 3.0*  CL 103 109 107  CO2 26 25 26   GLUCOSE 254* 217* 163*  BUN 58* 45* 28*  CREATININE 2.44* 1.66* 1.35*  CALCIUM 9.9 8.9 8.7*   LFT Recent Labs    09/06/21 0805  PROT 6.2*  ALBUMIN 2.7*  AST 12*  ALT 16  ALKPHOS 29*  BILITOT 0.4   PT/INR No results for input(s): LABPROT, INR in the last 72 hours. Hepatitis Panel No results for input(s): HEPBSAG, HCVAB, HEPAIGM, HEPBIGM in the last 72 hours.  CT ABDOMEN PELVIS WO  CONTRAST  Result Date: 09/04/2021 CLINICAL DATA:  Abdominal distension and pain. Acute urinary retention. EXAM: CT ABDOMEN AND PELVIS WITHOUT CONTRAST TECHNIQUE: Multidetector CT imaging of the abdomen and pelvis was performed following the standard protocol without IV contrast. COMPARISON:  CT 06/29/2015 FINDINGS: Lower chest: No acute airspace disease or pleural effusion. Upper normal heart size with coronary artery calcifications. Hepatobiliary: Focal fatty infiltration adjacent to the falciform ligament. No suspicious liver lesion on this unenhanced exam. Pneumobilia in the left lobe, unchanged. Cholecystectomy. Normal caliber common bile duct. Pancreas: No ductal dilatation or inflammation. Spleen: Normal in size without focal abnormality. Adrenals/Urinary Tract: No adrenal nodule. Mild bilateral hydronephrosis. Moderate bilateral perinephric edema, symmetric. No renal or ureteral calculi. Right retroperitoneal calcifications are adjacent to the right ureter. No evidence of focal renal lesion on this unenhanced exam. Foley catheter decompresses the urinary bladder. There is mild perivesicular edema. Stomach/Bowel: Mild wall thickening of the mid sigmoid colon, series 2, image 50, but no pericolonic edema or inflammation. Moderate volume of colonic stool. No small bowel obstruction or inflammatory change. Normal appendix. Stomach is partially distended. Vascular/Lymphatic: Moderate aortic and branch atherosclerosis. No portal venous or mesenteric gas. Suspected retroaortic left renal vein. No bulky abdominopelvic adenopathy. Reproductive: Markedly enlarged prostate gland. Prostate measures 6.6 x 7.7 x 7.3 cm (volume = 190 cm^3). Other: Fat containing left inguinal hernia. Minimal fat in the right inguinal canal. No abdominal ascites or free air. Musculoskeletal: Unilateral left L5 pars defects. Multilevel degenerative change in the spine. There are no acute or suspicious osseous abnormalities. IMPRESSION: 1.  Markedly enlarged prostate gland. Mild bilateral hydronephrosis with perinephric and perivesicular edema. Findings may be secondary to chronic bladder outlet obstruction or urinary tract infection. 2. Mild wall thickening of the mid sigmoid colon, but no pericolonic edema or inflammation. Recommend up-to-date colonoscopy. Aortic Atherosclerosis (ICD10-I70.0). Electronically Signed   By: Keith Rake M.D.   On: 09/04/2021 19:06   DG Chest Portable 1 View  Result Date: 09/04/2021 CLINICAL DATA:  Weakness altered EXAM: PORTABLE CHEST 1 VIEW COMPARISON:  CT 03/02/2016 FINDINGS: The heart size and mediastinal contours are within normal limits. Both lungs are clear. The visualized skeletal structures are unremarkable. IMPRESSION: No active disease. Electronically Signed   By: Donavan Foil M.D.   On: 09/04/2021 18:42        Principal Problem:   ARF (acute renal failure) (HCC) Active Problems:   Essential hypertension, benign   Diabetes mellitus type 2 in nonobese (HCC)   Generalized weakness   GI bleed   AKI (acute kidney injury) ([place])     LOS: 0 days   Tye Savoy ,NP 09/06/2021, 2:09 PM     Attending physician's note   I have taken an interval history, reviewed the chart and examined the patient. I agree with the Advanced  Practitioner's note, impression and recommendations.   No further diarrhea.  Stool neg for GI pathogens. C. difficile apparently not collected. Had 2 episodes of rectal bleeding today-more like hemorrhoidal bleeding. Hb stable. No abdo or rectal pain.  Did not want me to do a rectal exam today.   Plan: -Anusol HC cream PR BID -If any brisk bleeding, would consider RBC scan (has CKD, hence avoid CTA) to r/o div bleed. If +, then IR embolization. -If continued bleeding, willing to undergo flex sig with sedation on 11/25.  Otherwise conservative management given recent colon 06/2021. -Trend CBC. -Will perform chart check over thx-giving.  Pl call if any change  in clinical status or any ?[name]   Carmell Austria, MD [facility] [phone]

## 2021-09-06 NOTE — Progress Notes (Signed)
Initial Nutrition Assessment  DOCUMENTATION CODES:   Severe malnutrition in context of acute illness/injury  INTERVENTION:  - will order Ensure Enlive BID for once diet advanced to at least FLD, each supplement provides 350 kcal and 20 grams of protein. - will order 30 ml Prosource Plus TID, each supplement provides 100 kcal and 15 grams protein.  - will order 1 tablet multivitamin with minerals/day.    NUTRITION DIAGNOSIS:   Severe Malnutrition related to acute illness as evidenced by moderate fat depletion, moderate muscle depletion.  GOAL:   Patient will meet greater than or equal to 90% of their needs  MONITOR:   PO intake, Supplement acceptance, Labs, Weight trends, I & O's  REASON FOR ASSESSMENT:   Malnutrition Screening Tool    ASSESSMENT:   80 year old male with medical history of type 2 DM with most recent HgbA1c of 7.4%, HTN, HLD, BPH, stage 3 CKD, CAD, diverticulosis, erectile dysfunction, and inguinal hernia. He presented to the ED due to weakness, confusion, diarrhea x1 week. In the ED CT abdomen indicated bladder outlet obstruction with enlarged prostate. PT/OT recommending SNF. GI consulted due to diarrhea and CT showed mild sigmoid wall thickening with concern for colitis.  Patient sitting in the chair with male visitor at bedside. Patient has been eating 100% of all meals since admission. Heart Healthy/Carb Modified diet ordered late 11/21 night and changed to CLD today at 1344, after RD visit.   He had 100% of an egg salad sandwich, a drink, and chocolate ice cream for lunch today.   Patient lives alone and has been experiencing weakness recently, especially in the week PTA, but was still completing ADLs.  He reports UBW of 171-172 lb and that he last weighed this at outpatient GI appointment at the end of September. He reports that he does not recall being weighed at any time since coming to the hospital.   Weight yesterday was documented as 157 lb and  weight on 11/21 was documented as 154 lb. Weight on 06/22/21 was 174 lb. This indicates 20 lb weight loss (11% body weight) in the past 2.5 months. Patient reports that 15 lb of this occurred over the past 1 week.  He reports that for the 1 week PTA he ate bites to no food at all d/t very poor appetite and persistent diarrhea. He shares that since admission appetite has returned to base line. Visitor shares that patient had 1.5 L of urine drained in the ED. Patient and visitor feel this has, in part, contributed to appetite return.    Labs reviewed; CBGs: 250 and 94 mg/dl, K: 3 mmol/l, BUN: 28 mg/dl, creatinine: 1.35 mg/dl, Ca: 8.7 mg/dl, Phos: 2.3 mg/dl, Mg: 1.4 mg/dl, GFR: 53 ml/min.  Medications reviewed; sliding scale novolog.    NUTRITION - FOCUSED PHYSICAL EXAM:  Flowsheet Row Most Recent Value  Orbital Region Mild depletion  Upper Arm Region Moderate depletion  Thoracic and Lumbar Region Unable to assess  Buccal Region Moderate depletion  Temple Region Moderate depletion  Clavicle Bone Region Moderate depletion  Clavicle and Acromion Bone Region Moderate depletion  Scapular Bone Region Mild depletion  Dorsal Hand Moderate depletion  Patellar Region Moderate depletion  Anterior Thigh Region Moderate depletion  Posterior Calf Region Severe depletion  Edema (RD Assessment) None  Hair Reviewed  Eyes Reviewed  Mouth Reviewed  Skin Reviewed  Nails Reviewed       Diet Order:   Diet Order  Diet clear liquid Room service appropriate? Yes; Fluid consistency: Thin  Diet effective now                   EDUCATION NEEDS:   Not appropriate for education at this time  Skin:  Skin Assessment: Reviewed RN Assessment  Last BM:  11/23 (type 7, medium amount)  Height:   Ht Readings from Last 1 Encounters:  09/04/21 5\' 8"  (1.727 m)    Weight:   Wt Readings from Last 1 Encounters:  09/05/21 71.2 kg     Estimated Nutritional Needs:  Kcal:  2100-2300  kcal Protein:  105-115 grams Fluid:  >/= 2.3 L/day     Jarome Matin, MS, RD, LDN, CNSC Inpatient Clinical Dietitian RD pager # available in [facility]  After hours/weekend pager # available in [facility]

## 2021-09-06 NOTE — H&P (View-Only) (Signed)
.    Progress Note Hospital Day: 3  Chief Complaint:  diarrhea and abnormal sigmoid colon on CTscan     ASSESSMENT AND PLAN   # 80 yo male admitted with weakness, urinary symptoms, AKI on CKD 3 , diarrhea ( x 1 week).  CTAP >>mild sigmoid wall thickening,  enlarged prostate , obstructive uropathy. -Creatinine improving 2.4 >> 1.66 >> 1.36 -Diarrhea and sigmoid thickening on CTAP. He had an incidental finding of active sigmoid colitis on polyp surveillance colonoscopy with biopsies in Sept 2022.  No evidence for IBD, dysplasia. DDx included SCAD vrs ischemic vrs med related vrs infection. He was asymptomatic, no further evaluation was recommended. Now with one week history of diarrhea. GI path panel negative but still awaiting C-diff to be collected ( may not get done as diarrhea has ceased)- -Ischemia seems unlikely.  No longer having diarrhea making infectious etiology seem unlikely though C-diff not yet collected.  -Considering trial of mesalamine for ? SCAD   # Low volume painless rectal bleeding today x 2 episodes. Seems unlikely related to the sigmoid findings on CT scan. Suspect perianal source in setting of recent diarrhea.  --Repeat CBC is pending --Treat empirically for hemorrhoids with Anusol cream BID.  --If bleeding continues then probable RBC scan. No CTA given renal disease --Depending on clinical course he made need flexible sigmoidoscopy Friday.     # ? Weight loss. Weight down from 173 pounds late Sept to 156 pounds.     SUBJECTIVE       No complaints. Diarrhea resolved . Had two small episodes of painless rectal bleeding this am .    OBJECTIVE      Scheduled inpatient medications:   (feeding supplement) PROSource Plus  30 mL Oral TID BM   Chlorhexidine Gluconate Cloth  6 each Topical Daily   feeding supplement  237 mL Oral BID BM   finasteride  5 mg Oral Daily   insulin aspart  0-9 Units Subcutaneous TID WC   metoprolol succinate  50 mg Oral Daily    multivitamin with minerals  1 tablet Oral Daily   potassium chloride  40 mEq Oral Q4H   terazosin  2 mg Oral Daily   Continuous inpatient infusions:  PRN inpatient medications: acetaminophen **OR** acetaminophen, hydrALAZINE  Vital signs in last 24 hours: Temp:  [98 F (36.7 C)-98.8 F (37.1 C)] 98 F (36.7 C) (11/23 1338) Pulse Rate:  [54-86] 86 (11/23 1338) Resp:  [18] 18 (11/23 1338) BP: (138-167)/(51-65) 138/51 (11/23 1338) SpO2:  [94 %-97 %] 97 % (11/23 1338) Last BM Date: 09/06/21  Intake/Output Summary (Last 24 hours) at 09/06/2021 1409 Last data filed at 09/06/2021 1000 Gross per 24 hour  Intake 240 ml  Output 2175 ml  Net -1935 ml     Physical Exam:  General: Alert male in NAD Heart:  Regular rate and rhythm. No lower extremity edema Pulmonary: Normal respiratory effort Abdomen: Soft, nondistended, nontender. Normal bowel sounds.  Neurologic: Alert and oriented Psych: Pleasant. Cooperative.   Filed Weights   09/04/21 1718 09/04/21 2201 09/05/21 0955  Weight: 79 kg 69.7 kg 71.2 kg    Intake/Output from previous day: 11/22 0701 - 11/23 0700 In: 480 [P.O.:480] Out: 2025 [Urine:2025] Intake/Output this shift: Total I/O In: 240 [P.O.:240] Out: 700 [Urine:700]    Lab Results: Recent Labs    09/04/21 1734 09/05/21 0330 09/06/21 0805  WBC 7.3 5.3 4.6  HGB 14.2 12.6* 12.5*  HCT 43.4 38.9* 38.2*  PLT 202 164  160   BMET Recent Labs    09/04/21 1734 09/05/21 0330 09/06/21 0805  NA 140 141 141  K 3.6 3.3* 3.0*  CL 103 109 107  CO2 26 25 26   GLUCOSE 254* 217* 163*  BUN 58* 45* 28*  CREATININE 2.44* 1.66* 1.35*  CALCIUM 9.9 8.9 8.7*   LFT Recent Labs    09/06/21 0805  PROT 6.2*  ALBUMIN 2.7*  AST 12*  ALT 16  ALKPHOS 29*  BILITOT 0.4   PT/INR No results for input(s): LABPROT, INR in the last 72 hours. Hepatitis Panel No results for input(s): HEPBSAG, HCVAB, HEPAIGM, HEPBIGM in the last 72 hours.  CT ABDOMEN PELVIS WO  CONTRAST  Result Date: 09/04/2021 CLINICAL DATA:  Abdominal distension and pain. Acute urinary retention. EXAM: CT ABDOMEN AND PELVIS WITHOUT CONTRAST TECHNIQUE: Multidetector CT imaging of the abdomen and pelvis was performed following the standard protocol without IV contrast. COMPARISON:  CT 06/29/2015 FINDINGS: Lower chest: No acute airspace disease or pleural effusion. Upper normal heart size with coronary artery calcifications. Hepatobiliary: Focal fatty infiltration adjacent to the falciform ligament. No suspicious liver lesion on this unenhanced exam. Pneumobilia in the left lobe, unchanged. Cholecystectomy. Normal caliber common bile duct. Pancreas: No ductal dilatation or inflammation. Spleen: Normal in size without focal abnormality. Adrenals/Urinary Tract: No adrenal nodule. Mild bilateral hydronephrosis. Moderate bilateral perinephric edema, symmetric. No renal or ureteral calculi. Right retroperitoneal calcifications are adjacent to the right ureter. No evidence of focal renal lesion on this unenhanced exam. Foley catheter decompresses the urinary bladder. There is mild perivesicular edema. Stomach/Bowel: Mild wall thickening of the mid sigmoid colon, series 2, image 50, but no pericolonic edema or inflammation. Moderate volume of colonic stool. No small bowel obstruction or inflammatory change. Normal appendix. Stomach is partially distended. Vascular/Lymphatic: Moderate aortic and branch atherosclerosis. No portal venous or mesenteric gas. Suspected retroaortic left renal vein. No bulky abdominopelvic adenopathy. Reproductive: Markedly enlarged prostate gland. Prostate measures 6.6 x 7.7 x 7.3 cm (volume = 190 cm^3). Other: Fat containing left inguinal hernia. Minimal fat in the right inguinal canal. No abdominal ascites or free air. Musculoskeletal: Unilateral left L5 pars defects. Multilevel degenerative change in the spine. There are no acute or suspicious osseous abnormalities. IMPRESSION: 1.  Markedly enlarged prostate gland. Mild bilateral hydronephrosis with perinephric and perivesicular edema. Findings may be secondary to chronic bladder outlet obstruction or urinary tract infection. 2. Mild wall thickening of the mid sigmoid colon, but no pericolonic edema or inflammation. Recommend up-to-date colonoscopy. Aortic Atherosclerosis (ICD10-I70.0). Electronically Signed   By: Keith Rake M.D.   On: 09/04/2021 19:06   DG Chest Portable 1 View  Result Date: 09/04/2021 CLINICAL DATA:  Weakness altered EXAM: PORTABLE CHEST 1 VIEW COMPARISON:  CT 03/02/2016 FINDINGS: The heart size and mediastinal contours are within normal limits. Both lungs are clear. The visualized skeletal structures are unremarkable. IMPRESSION: No active disease. Electronically Signed   By: Donavan Foil M.D.   On: 09/04/2021 18:42        Principal Problem:   ARF (acute renal failure) (HCC) Active Problems:   Essential hypertension, benign   Diabetes mellitus type 2 in nonobese (HCC)   Generalized weakness   GI bleed   AKI (acute kidney injury) ([place])     LOS: 0 days   Tye Savoy ,NP 09/06/2021, 2:09 PM     Attending physician's note   I have taken an interval history, reviewed the chart and examined the patient. I agree with the Advanced  Practitioner's note, impression and recommendations.   No further diarrhea.  Stool neg for GI pathogens. C. difficile apparently not collected. Had 2 episodes of rectal bleeding today-more like hemorrhoidal bleeding. Hb stable. No abdo or rectal pain.  Did not want me to do a rectal exam today.   Plan: -Anusol HC cream PR BID -If any brisk bleeding, would consider RBC scan (has CKD, hence avoid CTA) to r/o div bleed. If +, then IR embolization. -If continued bleeding, willing to undergo flex sig with sedation on 11/25.  Otherwise conservative management given recent colon 06/2021. -Trend CBC. -Will perform chart check over thx-giving.  Pl call if any change  in clinical status or any ?[name]   Carmell Austria, MD [facility] [phone]

## 2021-09-06 NOTE — Progress Notes (Signed)
Patient refuses to allow lab to draw blood.  States, "Ya'll draw too much blood."

## 2021-09-07 DIAGNOSIS — K625 Hemorrhage of anus and rectum: Secondary | ICD-10-CM

## 2021-09-07 DIAGNOSIS — N179 Acute kidney failure, unspecified: Secondary | ICD-10-CM | POA: Diagnosis not present

## 2021-09-07 LAB — CBC
HCT: 37 % — ABNORMAL LOW (ref 39.0–52.0)
HCT: 38.8 % — ABNORMAL LOW (ref 39.0–52.0)
Hemoglobin: 12.3 g/dL — ABNORMAL LOW (ref 13.0–17.0)
Hemoglobin: 12.5 g/dL — ABNORMAL LOW (ref 13.0–17.0)
MCH: 28.5 pg (ref 26.0–34.0)
MCH: 28.3 pg (ref 26.0–34.0)
MCHC: 33.2 g/dL (ref 30.0–36.0)
MCHC: 32.2 g/dL (ref 30.0–36.0)
MCV: 85.6 fL (ref 80.0–100.0)
MCV: 87.8 fL (ref 80.0–100.0)
Platelets: 171 10*3/uL (ref 150–400)
Platelets: 199 10*3/uL (ref 150–400)
RBC: 4.32 MIL/uL (ref 4.22–5.81)
RBC: 4.42 MIL/uL (ref 4.22–5.81)
RDW: 13.5 % (ref 11.5–15.5)
RDW: 13.5 % (ref 11.5–15.5)
WBC: 5.5 10*3/uL (ref 4.0–10.5)
WBC: 5.5 10*3/uL (ref 4.0–10.5)
nRBC: 0 % (ref 0.0–0.2)
nRBC: 0 % (ref 0.0–0.2)

## 2021-09-07 LAB — BASIC METABOLIC PANEL
Anion gap: 7 (ref 5–15)
BUN: 23 mg/dL (ref 8–23)
CO2: 27 mmol/L (ref 22–32)
Calcium: 8.6 mg/dL — ABNORMAL LOW (ref 8.9–10.3)
Chloride: 105 mmol/L (ref 98–111)
Creatinine, Ser: 1.19 mg/dL (ref 0.61–1.24)
GFR, Estimated: >60 mL/min (ref >60)
Glucose, Bld: 183 mg/dL — ABNORMAL HIGH (ref 70–99)
Potassium: 2.8 mmol/L — ABNORMAL LOW (ref 3.5–5.1)
Sodium: 139 mmol/L (ref 135–145)

## 2021-09-07 LAB — T3: T3, Total: 70 ng/dL — ABNORMAL LOW (ref 71–180)

## 2021-09-07 LAB — GLUCOSE, CAPILLARY
Glucose-Capillary: 177 mg/dL — ABNORMAL HIGH (ref 70–99)
Glucose-Capillary: 196 mg/dL — ABNORMAL HIGH (ref 70–99)
Glucose-Capillary: 251 mg/dL — ABNORMAL HIGH (ref 70–99)
Glucose-Capillary: 185 mg/dL — ABNORMAL HIGH (ref 70–99)

## 2021-09-07 MED ORDER — MAGNESIUM SULFATE 4 GM/100ML IV SOLN
4.0000 g | Freq: Once | INTRAVENOUS | Status: AC
  Administered 2021-09-07: 4 g via INTRAVENOUS
  Filled 2021-09-07: qty 100

## 2021-09-07 MED ORDER — POTASSIUM CHLORIDE CRYS ER 20 MEQ PO TBCR
40.0000 meq | EXTENDED_RELEASE_TABLET | ORAL | Status: AC
  Administered 2021-09-07 (×2): 40 meq via ORAL
  Filled 2021-09-07 (×2): qty 2

## 2021-09-07 NOTE — Progress Notes (Signed)
Progress Note Hospital Day: 4  Chief Complaint:  diarrhea, abnormal sigmoid colon on CT scan       ASSESSMENT AND PLAN    80 yo male admitted with weakness, weight loss, urinary symptoms, AKI on CKD 3, diarrhea ( x 1 week).  CTAP >>mild sigmoid wall thickening,  enlarged prostate , obstructive uropathy.  -Creatinine has normalized -Diarrhea and sigmoid thickening on CTAP. He had an incidental finding of active sigmoid colitis on polyp surveillance colonoscopy with biopsies in Sept 2022.  No evidence for IBD, dysplasia. DDx included SCAD vrs ischemic vrs med related vrs infection. He was asymptomatic, no further evaluation was recommended. Now presenting with one week history of diarrhea. GI path panel negative,  C-diff not collected as diarrhea resolved.  --Just passed a small amount of mucous with blood from rectum. Apparently had similar episode last evening. Still could be hemorrhoidal. Not bleeding enough for a CTA but given persistent bleeding will plan for flexible sigmoidoscopy tomorrow. Continue Anusol cream PR in interim.   --Hgb is stable at 12.3.   #  Hypokalemia, repletion is in progress      SUBJECTIVE   No abdominal pain. Nursing staff reports episode of rectal bleeding / mucous this am ( in absence of bowel movement). Diarrhea has resolved.        OBJECTIVE     Scheduled inpatient medications:   (feeding supplement) PROSource Plus  30 mL Oral TID BM   Chlorhexidine Gluconate Cloth  6 each Topical Daily   feeding supplement  237 mL Oral BID BM   finasteride  5 mg Oral Daily   hydrocortisone   Rectal BID   insulin aspart  0-9 Units Subcutaneous TID WC   metoprolol succinate  50 mg Oral Daily   multivitamin with minerals  1 tablet Oral Daily   potassium chloride  40 mEq Oral Q4H   terazosin  2 mg Oral Daily   Continuous inpatient infusions:   magnesium sulfate bolus IVPB     PRN inpatient medications: acetaminophen **OR** acetaminophen,  hydrALAZINE  Vital signs in last 24 hours: Temp:  [97.4 F (36.3 C)-99 F (37.2 C)] 99 F (37.2 C) (11/24 0412) Pulse Rate:  [72-86] 72 (11/24 0412) Resp:  [16-18] 16 (11/24 0412) BP: (138-177)/(51-67) 177/67 (11/24 0412) SpO2:  [97 %-98 %] 97 % (11/24 0412) Last BM Date: 09/06/21  Intake/Output Summary (Last 24 hours) at 09/07/2021 0830 Last data filed at 09/07/2021 0214 Gross per 24 hour  Intake 420 ml  Output 2075 ml  Net -1655 ml     Physical Exam:  General: Alert male in NAD. Brother in room Heart:  Regular rate and rhythm. No lower extremity edema Pulmonary: Normal respiratory effort Abdomen: Soft, nondistended, nontender. Normal bowel sounds.  Neurologic: Alert and oriented Psych: Pleasant. Cooperative.   Filed Weights   09/04/21 1718 09/04/21 2201 09/05/21 0955  Weight: 79 kg 69.7 kg 71.2 kg    Intake/Output from previous day: 11/23 0701 - 11/24 0700 In: 660 [P.O.:660] Out: 2075 [Urine:2075] Intake/Output this shift: No intake/output data recorded.    Lab Results: Recent Labs    09/06/21 0805 09/06/21 1617 09/07/21 0450  WBC 4.6 5.2 5.5  HGB 12.5* 12.6* 12.3*  HCT 38.2* 39.4 37.0*  PLT 160 168 171   BMET Recent Labs    09/05/21 0330 09/06/21 0805 09/07/21 0450  NA 141 141 139  K 3.3* 3.0* 2.8*  CL 109 107 105  CO2 25 26 27   GLUCOSE  217* 163* 183*  BUN 45* 28* 23  CREATININE 1.66* 1.35* 1.19  CALCIUM 8.9 8.7* 8.6*   LFT Recent Labs    09/06/21 0805  PROT 6.2*  ALBUMIN 2.7*  AST 12*  ALT 16  ALKPHOS 29*  BILITOT 0.4   PT/INR No results for input(s): LABPROT, INR in the last 72 hours. Hepatitis Panel No results for input(s): HEPBSAG, HCVAB, HEPAIGM, HEPBIGM in the last 72 hours.  No results found.    Principal Problem:   ARF (acute renal failure) (HCC) Active Problems:   Essential hypertension, benign   Diabetes mellitus type 2 in nonobese (HCC)   Generalized weakness   GI bleed   AKI (acute kidney injury) ([place])    Protein-calorie malnutrition, severe     LOS: 1 day   Tye Savoy ,NP 09/07/2021, 8:30 AM

## 2021-09-07 NOTE — Progress Notes (Signed)
PROGRESS NOTE    Neil [name]   CBS:[id number]  DOB: September 11, 1941  DOA: 09/04/2021 PCP: Biagio Borg, MD   Brief Narrative:  Neil Ledger IIis a 80 year old elderly Caucasian male with a past medical history significant for but limited to diabetes mellitus type 2 with last hemoglobin A1c of 7.4 in August, hypertension, hyperlipidemia as well as other comorbidities including BPH, history of bursitis, CKD stage III, history of colonic polyps, history of CAD seen on CT scan as well as other comorbidities who presented to the hospital because of weakness and confusion that has been persistent.   He states that typically urinates every night but about a week ago, he stopped urinating at night and has subsequently just been "dribbling urine all over the place".  He subsequently also began to have diarrhea. In the ED he was found to have bladder outlet obstruction and an enlarged prostate.  CT scan was also suggestive of possible mild sigmoid wall thickening.  He had no leukocytosis.    BUN 58 creatinine 2.44 A Foley catheter was placed to help relieve obstruction and BUN and creatinine have subsequently been improving.  Subjective: No new complaints. Per RN, he is pulling on his foley cath and having some mild hematuria. Per brother, the patient does not appear to be at his cognitive baseline yet.     Assessment & Plan:   Principal Problem:   ARF (acute renal failure) (HCC)-CKD stage IIIb -Baseline creatinine is 1.35 and GFR is around 45-50 -Creatinine has been improving ever since Foley catheter was placed  Active Problems: Bladder outlet obstruction-possible BPH - Foley placed as mentioned above -CT scan revealed a markedly enlarged prostate gland mild bilateral hydronephrosis with perinephric and perivesical edema - Proscar started - The patient already takes terazosin at home and this was continued at a dose of 2 mg - He will need at least 5 to 7 days of these above-mentioned  medications prior to giving a voiding trial - I have spoken with urology (Dr Junious Silk) who recommends an outpatient follow-up -  I have also asked his nurse to help obtain this appointment  Acute confusion - ? If he had underlying dementia- the patient was not terribly confused  GI bleed- acute- lower - possible (incidental) finding of sigmoid colitis noted on CT scan - 2 episodes of bleeding per rectum on 11/23 - GI has been following this patient for his diarrhea and "Mild wall thickening of the mid sigmoid colon" noted on CT-I have notified GI of his bleeding and the suspicion is that he may have hemorrhoids- GI feels they will likely do a sigmoidoscopy tomorrow - The patient is only on heparin for DVT prophylaxis and no other anticoagulation-we will hold heparin in place SCDs  Diarrhea - presented with nonbloody diarrhea-possible sigmoid colitis per CT- stool for GI pathogens is negative  Hypokalemia, hypomagnesemia - K 2.8 today- replacing  - Mg also low and being replaced    Essential hypertension, benign -Continue Toprol 50 mg daily -Benazepril on hold in setting of AKI    Diabetes mellitus type 2 in nonobese (HCC) -Continue sliding scale insulin -Glucophage on hold   Severe Generalized weakness/deconditioning   Abnormal thyroid function test - TSH noted to be 1.60, free T4 1.30 - Recommend these tests be rechecked as outpatient in 1 month   Time spent in minutes: 35 DVT prophylaxis: SCDs Code Status: full code Family Communication: brother at bedside Level of Care: Level of care: med/surg Disposition  Plan:  Status is: inpateint  The patient will require care spanning > 2 midnights and should be moved to inpatient because: GI bleed     Consultants:  GI Phone call with urology Procedures:  Foley cath Antimicrobials:  Anti-infectives (From admission, onward)    None        Objective: Vitals:   09/06/21 0600 09/06/21 1338 09/06/21 1946 09/07/21 0412   BP: (!) 167/62 (!) 138/51 (!) 156/61 (!) 177/67  Pulse: (!) 54 86 80 72  Resp:  18 18 16   Temp: 98.8 F (37.1 C) 98 F (36.7 C) (!) 97.4 F (36.3 C) 99 F (37.2 C)  TempSrc: Oral Oral Oral Oral  SpO2: 94% 97% 98% 97%  Weight:      Height:        Intake/Output Summary (Last 24 hours) at 09/07/2021 1321 Last data filed at 09/07/2021 1300 Gross per 24 hour  Intake 480 ml  Output 2175 ml  Net -1695 ml    Filed Weights   09/04/21 1718 09/04/21 2201 09/05/21 0955  Weight: 79 kg 69.7 kg 71.2 kg    Examination: General exam: Appears comfortable  HEENT: PERRLA, oral mucosa moist, no sclera icterus or thrush Respiratory system: Clear to auscultation. Respiratory effort normal. Cardiovascular system: S1 & S2 heard, regular rate and rhythm Gastrointestinal system: Abdomen soft, non-tender, nondistended. Normal bowel sounds   Central nervous system: Alert and oriented. No focal neurological deficits. Extremities: No cyanosis, clubbing or edema Skin: No rashes or ulcers Psychiatry:  Mood & affect appropriate.      Data Reviewed: I have personally reviewed following labs and imaging studies  CBC: Recent Labs  Lab 09/04/21 1734 09/05/21 0330 09/06/21 0805 09/06/21 1617 09/07/21 0450  WBC 7.3 5.3 4.6 5.2 5.5  NEUTROABS 5.6 3.5 2.7  --   --   HGB 14.2 12.6* 12.5* 12.6* 12.3*  HCT 43.4 38.9* 38.2* 39.4 37.0*  MCV 87.0 88.6 86.8 87.9 85.6  PLT 202 164 160 168 981    Basic Metabolic Panel: Recent Labs  Lab 09/04/21 1734 09/05/21 0330 09/06/21 0805 09/07/21 0450  NA 140 141 141 139  K 3.6 3.3* 3.0* 2.8*  CL 103 109 107 105  CO2 26 25 26 27   GLUCOSE 254* 217* 163* 183*  BUN 58* 45* 28* 23  CREATININE 2.44* 1.66* 1.35* 1.19  CALCIUM 9.9 8.9 8.7* 8.6*  MG  --   --  1.5*  1.4*  --   PHOS  --   --  2.3*  --     GFR: Estimated Creatinine Clearance: 48.7 mL/min (by C-G formula based on SCr of 1.19 mg/dL). Liver Function Tests: Recent Labs  Lab 09/04/21 1734  09/05/21 0330 09/06/21 0805  AST 14* 11* 12*  ALT 22 18 16   ALKPHOS 40 32* 29*  BILITOT 0.8 0.8 0.4  PROT 7.8 5.9* 6.2*  ALBUMIN 3.6 2.8* 2.7*    No results for input(s): LIPASE, AMYLASE in the last 168 hours. No results for input(s): AMMONIA in the last 168 hours. Coagulation Profile: No results for input(s): INR, PROTIME in the last 168 hours. Cardiac Enzymes: Recent Labs  Lab 09/05/21 0330  CKTOTAL 45*    BNP (last 3 results) No results for input(s): PROBNP in the last 8760 hours. HbA1C: No results for input(s): HGBA1C in the last 72 hours. CBG: Recent Labs  Lab 09/06/21 1206 09/06/21 1721 09/06/21 2003 09/07/21 0724 09/07/21 1114  GLUCAP 94 158* 236* 177* 196*    Lipid Profile: No  results for input(s): CHOL, HDL, LDLCALC, TRIG, CHOLHDL, LDLDIRECT in the last 72 hours. Thyroid Function Tests: Recent Labs    09/05/21 0330 09/06/21 0805  TSH 0.160*  --   FREET4  --  1.30*    Anemia Panel: No results for input(s): VITAMINB12, FOLATE, FERRITIN, TIBC, IRON, RETICCTPCT in the last 72 hours. Urine analysis:    Component Value Date/Time   COLORURINE YELLOW 09/04/2021 1732   APPEARANCEUR CLEAR 09/04/2021 1732   LABSPEC 1.009 09/04/2021 1732   PHURINE 5.0 09/04/2021 1732   GLUCOSEU >=500 (A) 09/04/2021 1732   GLUCOSEU 100 (A) 11/20/2019 1100   HGBUR MODERATE (A) 09/04/2021 1732   BILIRUBINUR NEGATIVE 09/04/2021 1732   KETONESUR NEGATIVE 09/04/2021 1732   PROTEINUR 30 (A) 09/04/2021 1732   UROBILINOGEN 0.2 11/20/2019 1100   NITRITE NEGATIVE 09/04/2021 1732   LEUKOCYTESUR NEGATIVE 09/04/2021 1732   Sepsis Labs: @LABRCNTIP (procalcitonin:4,lacticidven:4) ) Recent Results (from the past 240 hour(s))  Urine Culture     Status: None   Collection Time: 09/04/21  5:32 PM   Specimen: Urine, Catheterized  Result Value Ref Range Status   Specimen Description   Final    URINE, CATHETERIZED Performed at [facility], [place] [address]  [place], [place] [address]    Special Requests   Final    NONE Performed at [facility], [place] [address], [place], [place] [address]    Culture   Final    NO GROWTH Performed at [facility], [place] [address], [place], [place] [address]    Report Status 09/05/2021 FINAL  Final  Resp Panel by RT-PCR (Flu A&B, Covid) Nasopharyngeal Swab     Status: None   Collection Time: 09/04/21  9:15 PM   Specimen: Nasopharyngeal Swab; Nasopharyngeal(NP) swabs in vial transport medium  Result Value Ref Range Status   SARS Coronavirus 2 by RT PCR NEGATIVE NEGATIVE Final    Comment: (NOTE) SARS-CoV-2 target nucleic acids are NOT DETECTED.  The SARS-CoV-2 RNA is generally detectable in upper respiratory specimens during the acute phase of infection. The lowest concentration of SARS-CoV-2 viral copies this assay can detect is 138 copies/mL. A negative result does not preclude SARS-Cov-2 infection and should not be used as the sole basis for treatment or other patient management decisions. A negative result may occur with  improper specimen collection/handling, submission of specimen other than nasopharyngeal swab, presence of viral mutation(s) within the areas targeted by this assay, and inadequate number of viral copies(<138 copies/mL). A negative result must be combined with clinical observations, patient history, and epidemiological information. The expected result is Negative.  Fact Sheet for Patients:  EntrepreneurPulse.com.au  Fact Sheet for Healthcare Providers:  IncredibleEmployment.be  This test is no t yet approved or cleared by the [place] FDA and  has been authorized for detection and/or diagnosis of SARS-CoV-2 by FDA under an Emergency Use Authorization (EUA). This EUA will remain  in effect (meaning this test can be used) for the duration of the COVID-19 declaration under Section 564(b)(1) of the Act, 21 U.S.C.section  360bbb-3(b)(1), unless the authorization is terminated  or revoked sooner.       Influenza A by PCR NEGATIVE NEGATIVE Final   Influenza B by PCR NEGATIVE NEGATIVE Final    Comment: (NOTE) The Xpert Xpress SARS-CoV-2/FLU/RSV plus assay is intended as an aid in the diagnosis of influenza from Nasopharyngeal swab specimens and should not be used as a sole basis for treatment. Nasal washings and aspirates are unacceptable for  Xpert Xpress SARS-CoV-2/FLU/RSV testing.  Fact Sheet for Patients: EntrepreneurPulse.com.au  Fact Sheet for Healthcare Providers: IncredibleEmployment.be  This test is not yet approved or cleared by the [place] FDA and has been authorized for detection and/or diagnosis of SARS-CoV-2 by FDA under an Emergency Use Authorization (EUA). This EUA will remain in effect (meaning this test can be used) for the duration of the COVID-19 declaration under Section 564(b)(1) of the Act, 21 U.S.C. section 360bbb-3(b)(1), unless the authorization is terminated or revoked.  Performed at [facility], [place] [address] [place], [place] [address]   Gastrointestinal Panel by PCR , Stool     Status: None   Collection Time: 09/05/21  1:45 PM   Specimen: Stool  Result Value Ref Range Status   Campylobacter species NOT DETECTED NOT DETECTED Final   Plesimonas shigelloides NOT DETECTED NOT DETECTED Final   Salmonella species NOT DETECTED NOT DETECTED Final   Yersinia enterocolitica NOT DETECTED NOT DETECTED Final   Vibrio species NOT DETECTED NOT DETECTED Final   Vibrio cholerae NOT DETECTED NOT DETECTED Final   Enteroaggregative E coli (EAEC) NOT DETECTED NOT DETECTED Final   Enteropathogenic E coli (EPEC) NOT DETECTED NOT DETECTED Final   Enterotoxigenic E coli (ETEC) NOT DETECTED NOT DETECTED Final   Shiga like toxin producing E coli (STEC) NOT DETECTED NOT DETECTED Final   Shigella/Enteroinvasive E coli (EIEC) NOT  DETECTED NOT DETECTED Final   Cryptosporidium NOT DETECTED NOT DETECTED Final   Cyclospora cayetanensis NOT DETECTED NOT DETECTED Final   Entamoeba histolytica NOT DETECTED NOT DETECTED Final   Giardia lamblia NOT DETECTED NOT DETECTED Final   Adenovirus F40/41 NOT DETECTED NOT DETECTED Final   Astrovirus NOT DETECTED NOT DETECTED Final   Norovirus GI/GII NOT DETECTED NOT DETECTED Final   Rotavirus A NOT DETECTED NOT DETECTED Final   Sapovirus (I, [name], IV, and V) NOT DETECTED NOT DETECTED Final    Comment: Performed at [facility], [address] [place], [place] [address]         Radiology Studies: No results found.    Scheduled Meds:  (feeding supplement) PROSource Plus  30 mL Oral TID BM   Chlorhexidine Gluconate Cloth  6 each Topical Daily   feeding supplement  237 mL Oral BID BM   finasteride  5 mg Oral Daily   hydrocortisone   Rectal BID   insulin aspart  0-9 Units Subcutaneous TID WC   metoprolol succinate  50 mg Oral Daily   multivitamin with minerals  1 tablet Oral Daily   terazosin  2 mg Oral Daily   Continuous Infusions:   LOS: 1 day      Debbe Odea, MD [facility] Pager: [email or web address] 09/07/2021, 1:21 PM

## 2021-09-07 NOTE — TOC Progression Note (Signed)
Transition of Care [facility]) - Progression Note    Patient Details  Name: Neil [name] MRN: [id number] Date of Birth: [birth year]  Transition of Care [facility]) CM/SW Contact  Carissa Musick, Juliann Pulse, RN Phone Number: 09/07/2021, 12:54 PM  Clinical Narrative:  PT recc SNF-patient in agreement to fax out for SNF-await bed offers.     Expected Discharge Plan: [place] Barriers to Discharge: Continued Medical Work up  Expected Discharge Plan and Services Expected Discharge Plan: [place]   Discharge Planning Services: CM Consult   Living arrangements for the past 2 months: Single Family Home                                       Social Determinants of Health (SDOH) Interventions    Readmission Risk Interventions No flowsheet data found.

## 2021-09-07 NOTE — H&P (View-Only) (Signed)
Progress Note Hospital Day: 4  Chief Complaint:  diarrhea, abnormal sigmoid colon on CT scan       ASSESSMENT AND PLAN    80 yo male admitted with weakness, weight loss, urinary symptoms, AKI on CKD 3, diarrhea ( x 1 week).  CTAP >>mild sigmoid wall thickening,  enlarged prostate , obstructive uropathy.  -Creatinine has normalized -Diarrhea and sigmoid thickening on CTAP. He had an incidental finding of active sigmoid colitis on polyp surveillance colonoscopy with biopsies in Sept 2022.  No evidence for IBD, dysplasia. DDx included SCAD vrs ischemic vrs med related vrs infection. He was asymptomatic, no further evaluation was recommended. Now presenting with one week history of diarrhea. GI path panel negative,  C-diff not collected as diarrhea resolved.  --Just passed a small amount of mucous with blood from rectum. Apparently had similar episode last evening. Still could be hemorrhoidal. Not bleeding enough for a CTA but given persistent bleeding will plan for flexible sigmoidoscopy tomorrow. Continue Anusol cream PR in interim.   --Hgb is stable at 12.3.   #  Hypokalemia, repletion is in progress      SUBJECTIVE   No abdominal pain. Nursing staff reports episode of rectal bleeding / mucous this am ( in absence of bowel movement). Diarrhea has resolved.        OBJECTIVE     Scheduled inpatient medications:   (feeding supplement) PROSource Plus  30 mL Oral TID BM   Chlorhexidine Gluconate Cloth  6 each Topical Daily   feeding supplement  237 mL Oral BID BM   finasteride  5 mg Oral Daily   hydrocortisone   Rectal BID   insulin aspart  0-9 Units Subcutaneous TID WC   metoprolol succinate  50 mg Oral Daily   multivitamin with minerals  1 tablet Oral Daily   potassium chloride  40 mEq Oral Q4H   terazosin  2 mg Oral Daily   Continuous inpatient infusions:   magnesium sulfate bolus IVPB     PRN inpatient medications: acetaminophen **OR** acetaminophen,  hydrALAZINE  Vital signs in last 24 hours: Temp:  [97.4 F (36.3 C)-99 F (37.2 C)] 99 F (37.2 C) (11/24 0412) Pulse Rate:  [72-86] 72 (11/24 0412) Resp:  [16-18] 16 (11/24 0412) BP: (138-177)/(51-67) 177/67 (11/24 0412) SpO2:  [97 %-98 %] 97 % (11/24 0412) Last BM Date: 09/06/21  Intake/Output Summary (Last 24 hours) at 09/07/2021 0830 Last data filed at 09/07/2021 0214 Gross per 24 hour  Intake 420 ml  Output 2075 ml  Net -1655 ml     Physical Exam:  General: Alert male in NAD. Brother in room Heart:  Regular rate and rhythm. No lower extremity edema Pulmonary: Normal respiratory effort Abdomen: Soft, nondistended, nontender. Normal bowel sounds.  Neurologic: Alert and oriented Psych: Pleasant. Cooperative.   Filed Weights   09/04/21 1718 09/04/21 2201 09/05/21 0955  Weight: 79 kg 69.7 kg 71.2 kg    Intake/Output from previous day: 11/23 0701 - 11/24 0700 In: 660 [P.O.:660] Out: 2075 [Urine:2075] Intake/Output this shift: No intake/output data recorded.    Lab Results: Recent Labs    09/06/21 0805 09/06/21 1617 09/07/21 0450  WBC 4.6 5.2 5.5  HGB 12.5* 12.6* 12.3*  HCT 38.2* 39.4 37.0*  PLT 160 168 171   BMET Recent Labs    09/05/21 0330 09/06/21 0805 09/07/21 0450  NA 141 141 139  K 3.3* 3.0* 2.8*  CL 109 107 105  CO2 25 26 27   GLUCOSE  217* 163* 183*  BUN 45* 28* 23  CREATININE 1.66* 1.35* 1.19  CALCIUM 8.9 8.7* 8.6*   LFT Recent Labs    09/06/21 0805  PROT 6.2*  ALBUMIN 2.7*  AST 12*  ALT 16  ALKPHOS 29*  BILITOT 0.4   PT/INR No results for input(s): LABPROT, INR in the last 72 hours. Hepatitis Panel No results for input(s): HEPBSAG, HCVAB, HEPAIGM, HEPBIGM in the last 72 hours.  No results found.    Principal Problem:   ARF (acute renal failure) (HCC) Active Problems:   Essential hypertension, benign   Diabetes mellitus type 2 in nonobese (HCC)   Generalized weakness   GI bleed   AKI (acute kidney injury) ([place])    Protein-calorie malnutrition, severe     LOS: 1 day   Tye Savoy ,NP 09/07/2021, 8:30 AM

## 2021-09-07 NOTE — NC FL2 (Signed)
[place] LEVEL OF CARE SCREENING TOOL     IDENTIFICATION  Patient Name: Neil [name] Birthdate: March 20, 1941 Sex: male Admission Date (Current Location): 09/04/2021  [facility] and [place] Number:  Herbalist and Address:  [facility],  [place], [place]      Provider Number: [id number]  Attending Physician Name and Address:  Debbe Odea, MD  Relative Name and Phone Number:  Marcellous Snarski Brother [phone]    Current Level of Care: Hospital Recommended Level of Care: [place] Prior Approval Number:    Date Approved/Denied:   PASRR Number: [id number]  Discharge Plan: SNF    Current Diagnoses: Patient Active Problem List   Diagnosis Date Noted   GI bleed 09/06/2021   AKI (acute kidney injury) ([place]) 09/06/2021   Protein-calorie malnutrition, severe 09/06/2021   ARF (acute renal failure) ([place]) 09/04/2021   Diabetes mellitus type 2 in nonobese ([place]) 09/04/2021   Generalized weakness    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   Low back pain 11/27/2020   Dyspnea on exertion 05/24/2020   Tremor 11/25/2019   Vitamin D deficiency 11/25/2019   Depression 05/28/2019   Bursitis 05/28/2018   CKD (chronic kidney disease) stage 3, GFR 30-59 ml/min (HCC) 08/02/2017   Increased prostate specific antigen (PSA) velocity 08/02/2017   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   Coronary artery calcification seen on CT scan 03/07/2016   Eosinophilia 12/30/2015   Hepatic abscess    Streptococcal infection    Loss of appetite    Normocytic anemia 05/24/2015   Malnutrition of moderate degree ([place]) 05/24/2015   Essential hypertension, benign    Pyogenic hepatic abscess    Gall stones, common bile duct    Calculus of bile duct with obstruction and without cholangitis or cholecystitis    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   Calculus of bile duct with acute cholangitis with  obstruction    Gall stones 02/13/2015   Abnormal urine 02/08/2015   Skin lesion of back 12/12/2012   Hypertriglyceridemia 11/14/2011   Encounter for well adult exam with abnormal findings 11/11/2011   INGUINAL HERNIA, RIGHT, SMALL 11/06/2010   Disorder of bursae and tendons in shoulder region 11/06/2010   BUNDLE BRANCH BLOCK, RIGHT 07/21/2009   DIVERTICULOSIS, COLON 07/21/2009   Diabetes ([place]) 12/04/2007   ALLERGIC RHINITIS 12/04/2007   Hyperlipidemia 06/05/2007   ERECTILE DYSFUNCTION 06/05/2007   BENIGN PROSTATIC HYPERTROPHY 06/05/2007   COLONIC POLYPS, HX OF 06/05/2007    Orientation RESPIRATION BLADDER Height & Weight     Self, Time, Situation, Place  Normal   Weight: 71.2 kg Height:  5\' 8"  (172.7 cm)  BEHAVIORAL SYMPTOMS/MOOD NEUROLOGICAL BOWEL NUTRITION STATUS      Continent Diet (Heart Healthy)  AMBULATORY STATUS COMMUNICATION OF NEEDS Skin   Limited Assist Verbally Normal                       Personal Care Assistance Level of Assistance  Bathing, Feeding, Dressing Bathing Assistance: Limited assistance Feeding assistance: Limited assistance Dressing Assistance: Limited assistance     Functional Limitations Info  Sight, Hearing, Speech Sight Info: Impaired (eyeglasses)   Speech Info: Impaired (Dentures-top/bottom)    SPECIAL CARE FACTORS FREQUENCY  PT (By licensed PT), OT (By licensed OT)     PT Frequency:  (5x week) OT Frequency:  (5x week)            Contractures Contractures Info: Not present  Additional Factors Info  Code Status, Allergies, Insulin Sliding Scale Code Status Info:  (Full) Allergies Info:  (Statins, Zanaflex (Tizanidine), Zetia (Ezetimibe)   Insulin Sliding Scale Info:  (SSI)       Current Medications (09/07/2021):  This is the current hospital active medication list Current Facility-Administered Medications  Medication Dose Route Frequency Provider Last Rate Last Admin   (feeding supplement) PROSource Plus liquid 30  mL  30 mL Oral TID BM Rizwan, Saima, MD   30 mL at 09/07/21 0901   acetaminophen (TYLENOL) tablet 650 mg  650 mg Oral Q6H PRN Rise Patience, MD       Or   acetaminophen (TYLENOL) suppository 650 mg  650 mg Rectal Q6H PRN Rise Patience, MD       Chlorhexidine Gluconate Cloth 2 % PADS 6 each  6 each Topical Daily Raiford Noble [place], DO   6 each at 09/07/21 0918   feeding supplement (ENSURE ENLIVE / ENSURE PLUS) liquid 237 mL  237 mL Oral BID BM Rizwan, Eunice Blase, MD   237 mL at 09/07/21 0855   finasteride (PROSCAR) tablet 5 mg  5 mg Oral Daily Raiford Noble [place], DO   5 mg at 09/07/21 0901   hydrALAZINE (APRESOLINE) injection 5 mg  5 mg Intravenous Q4H PRN Rise Patience, MD   5 mg at 09/04/21 2026   hydrocortisone (ANUSOL-HC) 2.5 % rectal cream   Rectal BID Willia Craze, NP   Given at 09/07/21 0909   insulin aspart (novoLOG) injection 0-9 Units  0-9 Units Subcutaneous TID WC Rise Patience, MD   2 Units at 09/07/21 0856   metoprolol succinate (TOPROL-XL) 24 hr tablet 50 mg  50 mg Oral Daily Rise Patience, MD   50 mg at 09/07/21 0901   multivitamin with minerals tablet 1 tablet  1 tablet Oral Daily Debbe Odea, MD   1 tablet at 09/07/21 0901   potassium chloride SA (KLOR-CON) CR tablet 40 mEq  40 mEq Oral Q4H Debbe Odea, MD   40 mEq at 09/07/21 0901   terazosin (HYTRIN) capsule 2 mg  2 mg Oral Daily Rise Patience, MD   2 mg at 09/07/21 0086     Discharge Medications: Please see discharge summary for a list of discharge medications.  Relevant Imaging Results:  Relevant Lab Results:   Additional Information [id number]  Arely Tinner, Juliann Pulse, [place]

## 2021-09-08 ENCOUNTER — Inpatient Hospital Stay (HOSPITAL_COMMUNITY): Payer: Medicare Other | Admitting: Anesthesiology

## 2021-09-08 ENCOUNTER — Encounter (HOSPITAL_COMMUNITY): Payer: Self-pay | Admitting: Internal Medicine

## 2021-09-08 ENCOUNTER — Encounter (HOSPITAL_COMMUNITY)
Admission: EM | Disposition: A | Discharge status: Home Health | Payer: Self-pay | Source: Home / Self Care | Attending: Internal Medicine

## 2021-09-08 DIAGNOSIS — K529 Noninfective gastroenteritis and colitis, unspecified: Secondary | ICD-10-CM

## 2021-09-08 DIAGNOSIS — K922 Gastrointestinal hemorrhage, unspecified: Secondary | ICD-10-CM | POA: Diagnosis not present

## 2021-09-08 DIAGNOSIS — R935 Abnormal findings on diagnostic imaging of other abdominal regions, including retroperitoneum: Secondary | ICD-10-CM

## 2021-09-08 HISTORY — PX: FLEXIBLE SIGMOIDOSCOPY: SHX5431

## 2021-09-08 HISTORY — PX: BIOPSY: SHX5522

## 2021-09-08 LAB — CBC
HCT: 38.7 % — ABNORMAL LOW (ref 39.0–52.0)
HCT: 41.5 % (ref 39.0–52.0)
HCT: 39.3 % (ref 39.0–52.0)
Hemoglobin: 12.6 g/dL — ABNORMAL LOW (ref 13.0–17.0)
Hemoglobin: 13.2 g/dL (ref 13.0–17.0)
Hemoglobin: 13.1 g/dL (ref 13.0–17.0)
MCH: 28.5 pg (ref 26.0–34.0)
MCH: 27.8 pg (ref 26.0–34.0)
MCH: 28.7 pg (ref 26.0–34.0)
MCHC: 32.6 g/dL (ref 30.0–36.0)
MCHC: 31.8 g/dL (ref 30.0–36.0)
MCHC: 33.3 g/dL (ref 30.0–36.0)
MCV: 87.6 fL (ref 80.0–100.0)
MCV: 87.4 fL (ref 80.0–100.0)
MCV: 86 fL (ref 80.0–100.0)
Platelets: 200 10*3/uL (ref 150–400)
Platelets: 226 10*3/uL (ref 150–400)
Platelets: 227 10*3/uL (ref 150–400)
RBC: 4.42 MIL/uL (ref 4.22–5.81)
RBC: 4.75 MIL/uL (ref 4.22–5.81)
RBC: 4.57 MIL/uL (ref 4.22–5.81)
RDW: 13.5 % (ref 11.5–15.5)
RDW: 13.5 % (ref 11.5–15.5)
RDW: 13.6 % (ref 11.5–15.5)
WBC: 7.6 10*3/uL (ref 4.0–10.5)
WBC: 7.9 10*3/uL (ref 4.0–10.5)
WBC: 8 10*3/uL (ref 4.0–10.5)
nRBC: 0 % (ref 0.0–0.2)
nRBC: 0 % (ref 0.0–0.2)
nRBC: 0 % (ref 0.0–0.2)

## 2021-09-08 LAB — BASIC METABOLIC PANEL
Anion gap: 7 (ref 5–15)
BUN: 20 mg/dL (ref 8–23)
CO2: 26 mmol/L (ref 22–32)
Calcium: 8.5 mg/dL — ABNORMAL LOW (ref 8.9–10.3)
Chloride: 107 mmol/L (ref 98–111)
Creatinine, Ser: 1.32 mg/dL — ABNORMAL HIGH (ref 0.61–1.24)
GFR, Estimated: 55 mL/min — ABNORMAL LOW (ref >60)
Glucose, Bld: 203 mg/dL — ABNORMAL HIGH (ref 70–99)
Potassium: 3.4 mmol/L — ABNORMAL LOW (ref 3.5–5.1)
Sodium: 140 mmol/L (ref 135–145)

## 2021-09-08 LAB — MAGNESIUM: Magnesium: 1.8 mg/dL (ref 1.7–2.4)

## 2021-09-08 LAB — GLUCOSE, CAPILLARY
Glucose-Capillary: 187 mg/dL — ABNORMAL HIGH (ref 70–99)
Glucose-Capillary: 153 mg/dL — ABNORMAL HIGH (ref 70–99)
Glucose-Capillary: 214 mg/dL — ABNORMAL HIGH (ref 70–99)
Glucose-Capillary: 208 mg/dL — ABNORMAL HIGH (ref 70–99)

## 2021-09-08 SURGERY — SIGMOIDOSCOPY, FLEXIBLE
Anesthesia: Monitor Anesthesia Care

## 2021-09-08 MED ORDER — PROPOFOL 500 MG/50ML IV EMUL
INTRAVENOUS | Status: DC | PRN
  Administered 2021-09-08: 30 mg via INTRAVENOUS
  Administered 2021-09-08: 40 mg via INTRAVENOUS
  Administered 2021-09-08: 30 mg via INTRAVENOUS

## 2021-09-08 MED ORDER — LACTATED RINGERS IV SOLN: INTRAVENOUS | Status: DC | PRN

## 2021-09-08 MED ORDER — PHENYLEPHRINE HCL (PRESSORS) 10 MG/ML IV SOLN
INTRAVENOUS | Status: DC | PRN
  Administered 2021-09-08: 80 ug via INTRAVENOUS

## 2021-09-08 MED ORDER — MESALAMINE 4 G RE ENEM
4.0000 g | ENEMA | Freq: Two times a day (BID) | RECTAL | Status: DC
  Administered 2021-09-08 – 2021-09-11 (×5): 4 g via RECTAL
  Filled 2021-09-08 (×8): qty 60

## 2021-09-08 MED ORDER — POTASSIUM CHLORIDE CRYS ER 20 MEQ PO TBCR
40.0000 meq | EXTENDED_RELEASE_TABLET | Freq: Once | ORAL | Status: AC
  Administered 2021-09-08: 40 meq via ORAL
  Filled 2021-09-08: qty 2

## 2021-09-08 MED ORDER — METRONIDAZOLE 500 MG PO TABS
500.0000 mg | ORAL_TABLET | Freq: Two times a day (BID) | ORAL | Status: DC
  Administered 2021-09-08 – 2021-09-11 (×7): 500 mg via ORAL
  Filled 2021-09-08 (×7): qty 1

## 2021-09-08 MED ORDER — CIPROFLOXACIN HCL 500 MG PO TABS
500.0000 mg | ORAL_TABLET | Freq: Two times a day (BID) | ORAL | Status: DC
  Administered 2021-09-08 – 2021-09-11 (×7): 500 mg via ORAL
  Filled 2021-09-08 (×7): qty 1

## 2021-09-08 NOTE — Anesthesia Postprocedure Evaluation (Signed)
Anesthesia Post Note  Patient: Neil [name]  Procedure(s) Performed: FLEXIBLE SIGMOIDOSCOPY BIOPSY     Patient location during evaluation: PACU Anesthesia Type: MAC Level of consciousness: awake and alert Pain management: pain level controlled Vital Signs Assessment: post-procedure vital signs reviewed and stable Respiratory status: spontaneous breathing, nonlabored ventilation, respiratory function stable and patient connected to nasal cannula oxygen Cardiovascular status: stable and blood pressure returned to baseline Postop Assessment: no apparent nausea or vomiting Anesthetic complications: no Comments: 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    No notable events documented.  Last Vitals:  Vitals:   09/08/21 1043 09/08/21 1053  BP: (!) 112/58   Pulse: 73 (!) 121  Resp: (!) 26 (!) 26  Temp:    SpO2: 94% 97%    Last Pain:  Vitals:   09/08/21 1043  TempSrc:   PainSc: [name]

## 2021-09-08 NOTE — TOC Progression Note (Signed)
Transition of Care [facility]) - Progression Note    Patient Details  Name: Neil [name] MRN: [id number] Date of Birth: [birth year]  Transition of Care [facility]) CM/SW Contact  Arren Laminack, Juliann Pulse, RN Phone Number: 09/08/2021, 3:36 PM  Clinical Narrative: Bed offers given await choice.    . 1.3 mi [facility] [place], [place] [address] [phone] Overall rating Above average 2. 1.6 mi [place] at [place], [place] [address] [phone] Overall rating Much below average 3. 2.1 mi [facility] [place] [place], [place] [address] [phone] Overall rating Much below average 4. 2.5 mi [facility] [place], [place] [address] [phone] Overall rating Below average 5. 2.8 mi [facility] at the [place], [place] [address] [phone] Overall rating Average 6. 2.8 mi [place] [address] [place], [place] [address] [phone] Overall rating Much below average 7. 3 mi [facility] [place], [place] [address] [phone] Overall rating Above average 8. 3.6 [place] [address] [place], [place] [address] [phone] Overall rating Average 9. 3.6 mi [facility], [place] [address], [place] [address] [phone] Overall rating Much below average 10. 3.9 mi [facility] [place], [place] [address] [phone] Overall rating Much below average 11. 4.4 mi [facility] [place], [place] [address] [phone] Overall rating Much above average 12. 4.6 mi [facility] [address] [place], [place]  [address] [phone] Overall rating Much above average 13. 5.5 mi [facility] [address] [place], [place] [address] [phone] Overall rating Above average 14. 8.2 [facility] [place], [place] [address] [phone] Overall rating Much above average 15. 9 mi [facility] [address] [place], [place] [address] [phone] Overall rating Below average 16. 9.1 [place] and [place], [place] [address] [phone] Overall rating Much below average 17. 9.2 mi [facility] [address] [place], [place] [address] [phone] Overall rating Much above average 18. 10.8 mi [place] [facility] [address] [place], [place] [address] [phone] Overall rating Much above average 19. 12.6 mi [facility] [address] [place], [place] [address] [phone] Overall rating Much below average 20. 12.8 [facility] [place], [place] [address] [phone] Overall rating Much below average 21. 14.2 mi [facility] [address] [place], [place] [address] [phone] Overall rating Much below average 22. 14.4 mi [facility] [place], [place] [address] [phone] Overall rating Above average 23. 14.8 mi [place] [facility] [place], [place] [address] [phone] Overall rating Much above average 24. 14.9 [place] [address] [place], [place] [address] [phone] Overall rating Much below average 25. 16.5 mi [place] [address] [place], [place] [address] [phone] Overall rating Average 26. 16.7 mi [facility] [place],   [address] [phone] Overall rating Above average 27. 17.9 mi [place], [place] [address] [phone] Overall rating Average 28. 99.2 [facility] [address] [place], [place] [address] [phone] Overall rating Much below average 29. 19.7 mi [place] [address] [place], [place] [address] [phone] Overall rating Much below average 30. 20 [address] at the [facility] at [facility], [place] [address] [phone] Overall rating Much above average 31. 21.1 mi [facility] [place], [place] [address] [phone] Overall rating Much below average 32. 21.6 [address] [place], [place] [address] [phone] Overall rating Below average 33. 66.2 [facility] [address] [place], [place] [address] [phone] Overall rating Below average 34. 21.8 [place], [place] [address] [phone] Overall rating Above average 35. [address] [place] [address] [place], [place] [address] [phone] Overall rating Much above average 36. 22.6 mi [facility] [address] [place], [place] [address] [phone] Overall rating Average 37. 22.7 mi [facility] [place], [place] [address] [phone] Overall rating Much below average 38. 23.3 mi [facility] [address] [place], [place] [address] [phone] Overall rating Above average 39. 23.5 [place], [place] [address] [phone] Overall rating Not available18 40. 24.1 mi [place] [address] [place], [place] [address] [phone] Overall  rating Much below average 41. 24.2 mi [facility] [address] [place], [place] [address] [phone] Overall rating Below average 42. 24.4 [facility] [address] [place], [place] [address] [phone] Overall rating Much below average 43. 24.5 mi [facility] [place], [place] [address] [phone] Overall rating Above average To explore and download nursing home data,visit th  Expected Discharge Plan: [place] Barriers to Discharge: Continued Medical Work up  Expected Discharge Plan and Services Expected Discharge Plan: [place]   Discharge Planning Services: CM Consult   Living arrangements for the past 2 months: Single Family Home                                       Social Determinants of Health (SDOH) Interventions    Readmission Risk Interventions No flowsheet data found.

## 2021-09-08 NOTE — Op Note (Signed)
[facility] Patient Name: Neil [name] Procedure Date: 09/08/2021 MRN: [id number] Attending MD: Thornton Park MD, MD Date of Birth: August 25, 1941 CSN: [id number] Age: 80 Admit Type: Inpatient Procedure:                Flexible Sigmoidoscopy Indications:              Rectal hemorrhage, Clinically significant diarrhea                            of unexplained origin                           Acute colitis seen on colonoscopy 9/22 but there                            were no GI symptoms at that time                           Now with symptoms and CT scan showing sigmoid                            thickening Providers:                Thornton Park MD, MD, Glori Bickers, RN, [facility] Technician, Technician Referring MD:              Medicines:                Monitored Anesthesia Care Complications:            No immediate complications. However, patient was                            noted to be in atrial fibrillation prior to the                            procedure and converted to normal sinus rythym with                            a bundle branch block during the procedure.                            Estimated blood loss: Minimal. Estimated Blood Loss:     Estimated blood loss was minimal. Procedure:                Pre-Anesthesia Assessment:                           - Prior to the procedure, a History and Physical                            was performed, and patient medications and                            allergies were reviewed. The patient's tolerance  of                            previous anesthesia was also reviewed. The risks                            and benefits of the procedure and the sedation                            options and risks were discussed with the patient.                            All questions were answered, and informed consent                            was obtained. Prior Anticoagulants: The patient has                             taken no previous anticoagulant or antiplatelet                            agents. ASA Grade Assessment: III - A patient with                            severe systemic disease. After reviewing the risks                            and benefits, the patient was deemed in                            satisfactory condition to undergo the procedure.                           After obtaining informed consent, the scope was                            passed under direct vision. The CF-HQ190L ([id number])                            Olympus colonoscope was introduced through the anus                            and advanced to the 40 cm from the anal verge. I                            was unable to advance further due to lack of prep                            in the colon. The flexible sigmoidoscopy was                            accomplished without difficulty. The patient  tolerated the procedure well. The quality of the                            bowel preparation was good. Scope In: Scope Out: Findings:      A few diverticula were found in the sigmoid colon.      A diffuse area of moderately erythematous mucosa was found from 10 to 30       cm proximal to the anus. There appeared to mild sparing at the recum. I       was unable to evaluate the mucosa proximal to 30 cm due to stool.       Biopsies were taken with a cold forceps for histology. Estimated blood       loss was minimal.      Non-bleeding internal hemorrhoids were found. Impression:               - Colitis involving at least the sigmoid. Located                            in an area of diverticulosis. Given chronicity of                            symptoms from 9/22, must consider chronic colitis                            such as IBD including segmental colitis associated                            with diverticulosis.                           - Non-bleeding internal  hemorrhoids. Moderate Sedation:      Not Applicable - Patient had care per Anesthesia. Recommendation:           - Return patient to hospital ward for ongoing care.                           - Diet as tolerated.                           - Await pathology results.                           - Sending fecal calprotectin for long-term                            monitoring.                           - Start Rowasa enemas BID.                           - Start oral Cipro and Flagyl.                           - Consider addition of oral steroids if symptoms do  not improve with local therapy and antibiotics.                           - Consider cardiology consultation.                           Results discussed with the patient and his brother.                            A copy of this procedure report was given to the                            patient before he returned to the hospital ward. Procedure Code(s):        --- Professional ---                           [phone], Sigmoidoscopy, flexible; with biopsy, single                            or multiple Diagnosis Code(s):        --- Professional ---                           K64.8, Other hemorrhoids                           K63.89, Other specified diseases of intestine                           K62.5, Hemorrhage of anus and rectum                           R19.7, Diarrhea, unspecified                           K57.30, Diverticulosis of large intestine without                            perforation or abscess without bleeding CPT copyright 2019 American Medical Association. All rights reserved. The codes documented in this report are preliminary and upon coder review may  be revised to meet current compliance requirements. Thornton Park MD, MD 09/08/2021 10:50:05 AM This report has been signed electronically. Number of Addenda: 0

## 2021-09-08 NOTE — Anesthesia Preprocedure Evaluation (Addendum)
Anesthesia Evaluation  Patient identified by MRN, date of birth, ID band Patient awake    Reviewed: Allergy & Precautions, NPO status , Patient's Chart, lab work & pertinent test results  Airway Mallampati: III  TM Distance: >3 FB Neck ROM: Full    Dental  (+) Teeth Intact, Dental Advisory Given   Pulmonary former smoker,    breath sounds clear to auscultation       Cardiovascular hypertension, Pt. on home beta blockers + CAD and + Peripheral Vascular Disease  + dysrhythmias  Rhythm:Irregular Rate:Normal     Neuro/Psych PSYCHIATRIC DISORDERS Depression  Neuromuscular disease    GI/Hepatic negative GI ROS, (+) Hepatitis -  Endo/Other  diabetes, Type 2, Oral Hypoglycemic Agents  Renal/GU CRFRenal disease     Musculoskeletal  (+) Arthritis ,   Abdominal Normal abdominal exam  (+)   Peds  Hematology   Anesthesia Other Findings   Reproductive/Obstetrics                            Anesthesia Physical Anesthesia Plan  ASA: 3  Anesthesia Plan: MAC   Post-op Pain Management:    Induction: Intravenous  PONV Risk Score and Plan: 0 and Ondansetron and Propofol infusion  Airway Management Planned: Simple Face Mask  Additional Equipment: None  Intra-op Plan:   Post-operative Plan:   Informed Consent: I have reviewed the patients History and Physical, chart, labs and discussed the procedure including the risks, benefits and alternatives for the proposed anesthesia with the patient or authorized representative who has indicated his/her understanding and acceptance.       Plan Discussed with: CRNA  Anesthesia Plan Comments: (Possible new onset paroxysmal afib, confirmed by EKG. Cardiology notified. )      Anesthesia Quick Evaluation

## 2021-09-08 NOTE — Progress Notes (Signed)
PROGRESS NOTE    Neil [name]   UTM:[id number]  DOB: 1941/03/21  DOA: 09/04/2021 PCP: Biagio Borg, MD   Brief Narrative:  Neil Ledger IIis a 80 year old elderly Caucasian male with a past medical history significant for but limited to diabetes mellitus type 2 with last hemoglobin A1c of 7.4 in August, hypertension, hyperlipidemia as well as other comorbidities including BPH, history of bursitis, CKD stage III, history of colonic polyps, history of CAD seen on CT scan as well as other comorbidities who presented to the hospital because of weakness and confusion that has been persistent.   He states that typically urinates every night but about a week ago, he stopped urinating at night and has subsequently just been "dribbling urine all over the place".  He subsequently also began to have diarrhea. In the ED he was found to have bladder outlet obstruction and an enlarged prostate.  CT scan was also suggestive of possible mild sigmoid wall thickening.  He had no leukocytosis.    BUN 58 creatinine 2.44 A Foley catheter was placed to help relieve obstruction and BUN and creatinine have subsequently been improving.  Subjective: He continues to have bloody stools.     Assessment & Plan:   Principal Problem:   ARF (acute renal failure) (HCC)-CKD stage IIIb -Baseline creatinine is 1.35 and GFR is around 45-50 -Creatinine has been improving ever since Foley catheter was placed  Active Problems: Bladder outlet obstruction-possible BPH - Foley placed as mentioned above & Proscar started -CT scan revealed a markedly enlarged prostate gland mild bilateral hydronephrosis with perinephric and perivesical edema - The patient already takes terazosin at home and this was continued at a dose of 2 mg - He will need at least 5 to 7 days of these above-mentioned medications prior to giving a voiding trial - I have spoken with urology (Dr Junious Silk) who recommends an outpatient follow-up -  I  have also asked his nurse to help obtain this appointment  GI bleed- acute- lower Diarrhea - presented with nonbloody diarrhea  - possible (incidental) finding of sigmoid colitis noted on CT scan -  bleeding per rectum then started on 11/23 and has persisted  - GI has been following this patient for his diarrhea    - sigmoidoscopy today reveals sigmoid colitis- GI has started Rowasa enemas BID, Cipro and Flagyl - stool for GI pathogens is negative  Hypokalemia - replace and recheck - Mg noted to be 1.8  Hematuria - due to tugging on foley cath- follow  Acute confusion - ? If he had underlying dementia-  he is AAO on my exam but his brother states that he can tell that the patient is not at his baseline  Hypokalemia, hypomagnesemia - K 2.8 today- replacing  - Mg also low and being replaced    Essential hypertension, benign -Continue Toprol 50 mg daily -Benazepril on hold in setting of AKI    Diabetes mellitus type 2 in nonobese (HCC) -Continue sliding scale insulin -Glucophage on hold - last A1c on 05/18/21 was 7.4   Severe Generalized weakness/deconditioning  - will need SNF as he is quite weak  Abnormal thyroid function test - TSH noted to be 1.60, free T4 1.30 - Recommend these tests be rechecked as outpatient in 1 month   Time spent in minutes: 35 DVT prophylaxis: SCDs Code Status: full code Family Communication: brother at bedside Level of Care: Level of care: med/surg Disposition Plan:  Status is: inpateint  The  patient will require care spanning > 2 midnights and should be moved to inpatient because: GI bleed   Consultants:  GI Phone call with urology Procedures:  Foley cath Flex Sig 11/25 Antimicrobials:  Anti-infectives (From admission, onward)    Start     Dose/Rate Route Frequency Ordered Stop   09/08/21 1300  ciprofloxacin (CIPRO) tablet 500 mg        500 mg Oral 2 times daily 09/08/21 1041 09/18/21 0759   09/08/21 1300  metroNIDAZOLE (FLAGYL)  tablet 500 mg        500 mg Oral Every 12 hours 09/08/21 1041 09/18/21 0959        Objective: Vitals:   09/08/21 1033 09/08/21 1043 09/08/21 1053 09/08/21 1132  BP: (!) 113/51 (!) 112/58 (!) 119/57 127/73  Pulse: 76 73 (!) 121 71  Resp: 19 (!) 26 (!) 26 20  Temp: 98 F (36.7 C)   97.8 F (36.6 C)  TempSrc: Axillary   Oral  SpO2: 100% 94% 97% 99%  Weight:      Height:        Intake/Output Summary (Last 24 hours) at 09/08/2021 1526 Last data filed at 09/08/2021 1312 Gross per 24 hour  Intake 660 ml  Output 1652 ml  Net -992 ml    Filed Weights   09/04/21 1718 09/04/21 2201 09/05/21 0955  Weight: 79 kg 69.7 kg 71.2 kg    Examination: General exam: Appears comfortable  HEENT: PERRLA, oral mucosa moist, no sclera icterus or thrush Respiratory system: Clear to auscultation. Respiratory effort normal. Cardiovascular system: S1 & S2 heard, regular rate and rhythm Gastrointestinal system: Abdomen soft, non-tender, nondistended. Normal bowel sounds   Central nervous system: Alert and oriented. No focal neurological deficits. Extremities: No cyanosis, clubbing or edema Skin: No rashes or ulcers Psychiatry:  Mood & affect appropriate.      Data Reviewed: I have personally reviewed following labs and imaging studies  CBC: Recent Labs  Lab 09/04/21 1734 09/05/21 0330 09/06/21 0805 09/06/21 1617 09/07/21 0450 09/07/21 1713 09/08/21 0427 09/08/21 0937 09/08/21 1324  WBC 7.3 5.3 4.6   < > 5.5 5.5 7.6 7.9 8.0  NEUTROABS 5.6 3.5 2.7  --   --   --   --   --   --   HGB 14.2 12.6* 12.5*   < > 12.3* 12.5* 12.6* 13.2 13.1  HCT 43.4 38.9* 38.2*   < > 37.0* 38.8* 38.7* 41.5 39.3  MCV 87.0 88.6 86.8   < > 85.6 87.8 87.6 87.4 86.0  PLT 202 164 160   < > 171 199 200 226 227   < > = values in this interval not displayed.    Basic Metabolic Panel: Recent Labs  Lab 09/04/21 1734 09/05/21 0330 09/06/21 0805 09/07/21 0450 09/08/21 0427  NA 140 141 141 139 140  K 3.6 3.3*  3.0* 2.8* 3.4*  CL 103 109 107 105 107  CO2 26 25 26 27 26   GLUCOSE 254* 217* 163* 183* 203*  BUN 58* 45* 28* 23 20  CREATININE 2.44* 1.66* 1.35* 1.19 1.32*  CALCIUM 9.9 8.9 8.7* 8.6* 8.5*  MG  --   --  1.5*  1.4*  --  1.8  PHOS  --   --  2.3*  --   --     GFR: Estimated Creatinine Clearance: 43.9 mL/min (A) (by C-G formula based on SCr of 1.32 mg/dL (H)). Liver Function Tests: Recent Labs  Lab 09/04/21 1734 09/05/21 0330 09/06/21 0805  AST 14* 11* 12*  ALT 22 18 16   ALKPHOS 40 32* 29*  BILITOT 0.8 0.8 0.4  PROT 7.8 5.9* 6.2*  ALBUMIN 3.6 2.8* 2.7*      Cardiac Enzymes: Recent Labs  Lab 09/05/21 0330  CKTOTAL 45*      CBG: Recent Labs  Lab 09/07/21 1114 09/07/21 1603 09/07/21 2013 09/08/21 0758 09/08/21 1129  GLUCAP 196* 251* 185* 187* 153*    L  Thyroid Function Tests: Recent Labs    09/06/21 0805  FREET4 1.30*      Urine analysis:    Component Value Date/Time   COLORURINE YELLOW 09/04/2021 1732   APPEARANCEUR CLEAR 09/04/2021 1732   LABSPEC 1.009 09/04/2021 1732   PHURINE 5.0 09/04/2021 1732   GLUCOSEU >=500 (A) 09/04/2021 1732   GLUCOSEU 100 (A) 11/20/2019 1100   HGBUR MODERATE (A) 09/04/2021 1732   BILIRUBINUR NEGATIVE 09/04/2021 1732   KETONESUR NEGATIVE 09/04/2021 1732   PROTEINUR 30 (A) 09/04/2021 1732   UROBILINOGEN 0.2 11/20/2019 1100   NITRITE NEGATIVE 09/04/2021 1732   LEUKOCYTESUR NEGATIVE 09/04/2021 1732      Radiology Studies: No results found.    Scheduled Meds:  (feeding supplement) PROSource Plus  30 mL Oral TID BM   Chlorhexidine Gluconate Cloth  6 each Topical Daily   ciprofloxacin  500 mg Oral BID   feeding supplement  237 mL Oral BID BM   finasteride  5 mg Oral Daily   hydrocortisone   Rectal BID   insulin aspart  0-9 Units Subcutaneous TID WC   mesalamine  4 g Rectal BID   metoprolol succinate  50 mg Oral Daily   metroNIDAZOLE  500 mg Oral Q12H   multivitamin with minerals  1 tablet Oral Daily    terazosin  2 mg Oral Daily   Continuous Infusions:   LOS: 2 days      Debbe Odea, MD [facility] Pager: [email or web address] 09/08/2021, 3:26 PM

## 2021-09-08 NOTE — Interval H&P Note (Signed)
History and Physical Interval Note:  09/08/2021 9:52 AM  Neil [name]  has presented today for surgery, with the diagnosis of rectal bleeding, sigmoid wall thickening on CT scan.  The various methods of treatment have been discussed with the patient and family. After consideration of risks, benefits and other options for treatment, the patient has consented to  Procedure(s): FLEXIBLE SIGMOIDOSCOPY (N/A) as a surgical intervention.  The patient's history has been reviewed, patient examined, no change in status, stable for surgery.  I have reviewed the patient's chart and labs.  Questions were answered to the patient's satisfaction.     Thornton Park

## 2021-09-08 NOTE — Transfer of Care (Signed)
Immediate Anesthesia Transfer of Care Note  Patient: Neil [name]  Procedure(s) Performed: Procedure(s): FLEXIBLE SIGMOIDOSCOPY (N/A) BIOPSY  Patient Location: PACU and Endoscopy Unit  Anesthesia Type:MAC  Level of Consciousness: awake, alert  and oriented  Airway & Oxygen Therapy: Patient Spontanous Breathing and Patient connected to nasal cannula oxygen  Post-op Assessment: Report given to RN and Post -op Vital signs reviewed and stable  Post vital signs: Reviewed and stable  Last Vitals:  Vitals:   09/08/21 0534 09/08/21 0954  BP: (!) 156/85 (!) 170/67  Pulse: 72 87  Resp: 17 12  Temp: 37.1 C 37.1 C  SpO2: 69% 79%    Complications: No apparent anesthesia complications

## 2021-09-08 NOTE — Progress Notes (Signed)
PT Cancellation Note  Patient Details Name: Neil [name] MRN: [id number] DOB: 25-Apr-1941   Cancelled Treatment:    Reason Eval/Treat Not Completed: Other (comment). Attempted x2, pt in bathroom. Continue efforts    [facility] 09/08/2021, 1:43 PM

## 2021-09-09 DIAGNOSIS — K5289 Other specified noninfective gastroenteritis and colitis: Secondary | ICD-10-CM | POA: Diagnosis not present

## 2021-09-09 LAB — BASIC METABOLIC PANEL
Anion gap: 10 (ref 5–15)
BUN: 23 mg/dL (ref 8–23)
CO2: 24 mmol/L (ref 22–32)
Calcium: 8.8 mg/dL — ABNORMAL LOW (ref 8.9–10.3)
Chloride: 103 mmol/L (ref 98–111)
Creatinine, Ser: 1.32 mg/dL — ABNORMAL HIGH (ref 0.61–1.24)
GFR, Estimated: 55 mL/min — ABNORMAL LOW (ref >60)
Glucose, Bld: 204 mg/dL — ABNORMAL HIGH (ref 70–99)
Potassium: 3.2 mmol/L — ABNORMAL LOW (ref 3.5–5.1)
Sodium: 137 mmol/L (ref 135–145)

## 2021-09-09 LAB — CBC
HCT: 38.2 % — ABNORMAL LOW (ref 39.0–52.0)
Hemoglobin: 12.2 g/dL — ABNORMAL LOW (ref 13.0–17.0)
MCH: 28 pg (ref 26.0–34.0)
MCHC: 31.9 g/dL (ref 30.0–36.0)
MCV: 87.8 fL (ref 80.0–100.0)
Platelets: 230 10*3/uL (ref 150–400)
RBC: 4.35 MIL/uL (ref 4.22–5.81)
RDW: 13.7 % (ref 11.5–15.5)
WBC: 7.9 10*3/uL (ref 4.0–10.5)
nRBC: 0 % (ref 0.0–0.2)

## 2021-09-09 LAB — GLUCOSE, CAPILLARY
Glucose-Capillary: 305 mg/dL — ABNORMAL HIGH (ref 70–99)
Glucose-Capillary: 149 mg/dL — ABNORMAL HIGH (ref 70–99)
Glucose-Capillary: 207 mg/dL — ABNORMAL HIGH (ref 70–99)
Glucose-Capillary: 216 mg/dL — ABNORMAL HIGH (ref 70–99)

## 2021-09-09 MED ORDER — POTASSIUM CHLORIDE CRYS ER 20 MEQ PO TBCR
40.0000 meq | EXTENDED_RELEASE_TABLET | ORAL | Status: AC
  Administered 2021-09-09 (×2): 40 meq via ORAL
  Filled 2021-09-09 (×2): qty 2

## 2021-09-09 MED ORDER — LORAZEPAM 2 MG/ML IJ SOLN: 1.0000 mg | INTRAMUSCULAR | Status: DC | PRN

## 2021-09-09 MED ORDER — LORAZEPAM 2 MG/ML IJ SOLN
0.5000 mg | Freq: Once | INTRAMUSCULAR | Status: AC
  Administered 2021-09-09: 0.5 mg via INTRAVENOUS
  Filled 2021-09-09: qty 1

## 2021-09-09 NOTE — Progress Notes (Signed)
Physical Therapy Treatment Patient Details Name: Neil [name] MRN: [id number] DOB: 1941-06-03 Today's Date: 09/09/2021   History of Present Illness 80 yo male admitted with ARF, weakness, sigmoid colitis. Hx of DM, RBBB, CKD    PT Comments    Pt is progressing nicely today. Incr gait distance/hallway amb, steady without LOB.  Pt has DME at home. Can likely go home with HHPT, pt seems agreeable. Will continue to follow in acute setting  Recommendations for follow up therapy are one component of a multi-disciplinary discharge planning process, led by the attending physician.  Recommendations may be updated based on patient status, additional functional criteria and insurance authorization.  Follow Up Recommendations  Home health PT     Assistance Recommended at Discharge Intermittent Supervision/Assistance  Equipment Recommendations  None recommended by PT    Recommendations for Other Services       Precautions / Restrictions Precautions Precautions: Fall Restrictions Weight Bearing Restrictions: No     Mobility  Bed Mobility Overal bed mobility: Needs Assistance                  Transfers Overall transfer level: Needs assistance Equipment used: Rolling walker (2 wheels) Transfers: Sit to/from Stand Sit to Stand: Supervision           General transfer comment: cues for hand placement, no physical assist    Ambulation/Gait Ambulation/Gait assistance: Min guard;Supervision Gait Distance (Feet): 90 Feet Assistive device: Rolling walker (2 wheels) Gait Pattern/deviations: Step-through pattern;Decreased stride length       General Gait Details: cues for RW position from self, slow but steady gait. pt without LOB   Stairs             Wheelchair Mobility    Modified Rankin (Stroke Patients Only)       Balance   Sitting-balance support: No upper extremity supported;Feet supported Sitting balance-Leahy Scale: Good     Standing balance  support: No upper extremity supported Standing balance-Leahy Scale: Fair                              Cognition Arousal/Alertness: Awake/alert Behavior During Therapy: WFL for tasks assessed/performed Overall Cognitive Status: No family/caregiver present to determine baseline cognitive functioning                               Problem Solving: Slow processing;Requires verbal cues General Comments: tangential, occaisonal redirection to stay on task        Exercises      General Comments        Pertinent Vitals/Pain Pain Assessment: No/denies pain    Home Living                          Prior Function            PT Goals (current goals can now be found in the care plan section) Acute Rehab PT Goals Patient Stated Goal: to get stronger PT Goal Formulation: With patient Time For Goal Achievement: 09/19/21 Potential to Achieve Goals: Good Progress towards PT goals: Progressing toward goals    Frequency    Min 3X/week      PT Plan Discharge plan needs to be updated    Co-evaluation              AM-PAC PT "6 Clicks" Mobility   Outcome  Measure  Help needed turning from your back to your side while in a flat bed without using bedrails?: None Help needed moving from lying on your back to sitting on the side of a flat bed without using bedrails?: A Little Help needed moving to and from a bed to a chair (including a wheelchair)?: A Little Help needed standing up from a chair using your arms (e.g., wheelchair or bedside chair)?: A Little Help needed to walk in hospital room?: A Little Help needed climbing 3-5 steps with a railing? : A Little 6 Click Score: 19    End of Session Equipment Utilized During Treatment: Gait belt Activity Tolerance: Patient tolerated treatment well Patient left: in chair;with call bell/phone within reach;with chair alarm set   PT Visit Diagnosis: Muscle weakness (generalized) (M62.81);Difficulty  in walking, not elsewhere classified (R26.2)     Time: 1222-1239 PT Time Calculation (min) (ACUTE ONLY): 17 min  Charges:  $Gait Training: 8-22 mins                     Baxter Flattery, PT  Acute Rehab Dept ([place]) [phone] Pager [phone]  09/09/2021    [facility] 09/09/2021, 1:14 PM

## 2021-09-09 NOTE — Progress Notes (Signed)
PROGRESS NOTE    Neil [name]   QZE:[id number]  DOB: [birth year]  DOA: 09/04/2021 PCP: Biagio Borg, MD   Brief Narrative:  Neil Ledger IIis a 80 year old elderly Caucasian male with a past medical history significant for but limited to diabetes mellitus type 2 with last hemoglobin A1c of 7.4 in August, hypertension, hyperlipidemia as well as other comorbidities including BPH, history of bursitis, CKD stage III, history of colonic polyps, history of CAD seen on CT scan as well as other comorbidities who presented to the hospital because of weakness and confusion that has been persistent.   He states that typically urinates every night but about a week ago, he stopped urinating at night and has subsequently just been "dribbling urine all over the place".  He subsequently also began to have diarrhea. In the ED he was found to have bladder outlet obstruction and an enlarged prostate.  CT scan was also suggestive of possible mild sigmoid wall thickening.  He had no leukocytosis.    BUN 58 creatinine 2.44 A Foley catheter was placed to help relieve obstruction and BUN and creatinine have subsequently been improving.  Subjective: Bloody stools have slowed down. He has some mid abdominal pain and bloating.     Assessment & Plan:   Principal Problem:   ARF (acute renal failure) (HCC)-CKD stage IIIb -Baseline creatinine is 1.35 and GFR is around 45-50 -Creatinine has been improving ever since Foley catheter was placed  Active Problems: Bladder outlet obstruction-possible BPH - Foley placed as mentioned above & Proscar started -CT scan revealed a markedly enlarged prostate gland mild bilateral hydronephrosis with perinephric and perivesical edema - The patient already takes terazosin at home and this was continued at a dose of 2 mg - He will need at least 5 to 7 days of these above-mentioned medications prior to giving a voiding trial - I have spoken with urology (Dr Junious Silk) who  recommends an outpatient follow-up -  I have also asked his nurse to help obtain this appointment  GI bleed- acute- lower Diarrhea> acute colitis - presented with nonbloody diarrhea  -   finding of sigmoid colitis noted on CT scan -  bleeding per rectum then started on 11/23 and has persisted  - GI has been following this patient for his diarrhea    - 11/25> sigmoidoscopy reveals sigmoid colitis- GI has started Rowasa enemas BID, Cipro and Flagyl- diarrhea and bleeding seemsto be improving - stool for GI pathogens is negative  Hypokalemia - replace and recheck - Mg noted to be 1.8  Hematuria - due to tugging on foley cath- resolving  Acute confusion - ? If he had underlying dementia-  he is AAO on my exam but his brother states that he can tell that the patient is not at his baseline  Hypokalemia, hypomagnesemia - K 2.8 > 3.2- replacing  - Mg also low and being replaced    Essential hypertension, benign -Continue Toprol 50 mg daily -Benazepril on hold in setting of AKI    Diabetes mellitus type 2 in nonobese (HCC) -Continue sliding scale insulin -Glucophage on hold - last A1c on 05/18/21 was 7.4   Severe Generalized weakness/deconditioning  - will need SNF as he is quite weak  Abnormal thyroid function test - TSH noted to be 1.60, free T4 1.30 - Recommend these tests be rechecked as outpatient in 1 month  CKD 3b - stable  Time spent in minutes: 35 DVT prophylaxis: SCDs Code Status: full code  Family Communication: brother at bedside Level of Care: Level of care: med/surg Disposition Plan:  Status is: inpateint  The patient will require care spanning > 2 midnights and should be moved to inpatient because: GI bleed   Consultants:  GI Phone call with urology Procedures:  Foley cath Flex Sig 11/25 Antimicrobials:  Anti-infectives (From admission, onward)    Start     Dose/Rate Route Frequency Ordered Stop   09/08/21 1300  ciprofloxacin (CIPRO) tablet 500 mg         500 mg Oral 2 times daily 09/08/21 1041 09/18/21 0759   09/08/21 1300  metroNIDAZOLE (FLAGYL) tablet 500 mg        500 mg Oral Every 12 hours 09/08/21 1041 09/18/21 0959        Objective: Vitals:   09/08/21 1132 09/08/21 2132 09/09/21 0457 09/09/21 1112  BP: 127/73 (!) 156/95 (!) 149/70 137/66  Pulse: 71 93 78 80  Resp: 20 18 18 18   Temp: 97.8 F (36.6 C) 98.1 F (36.7 C) 98 F (36.7 C) 98.1 F (36.7 C)  TempSrc: Oral Oral Oral Oral  SpO2: 99% 95% 95% 95%  Weight:      Height:        Intake/Output Summary (Last 24 hours) at 09/09/2021 1531 Last data filed at 09/09/2021 1256 Gross per 24 hour  Intake 240 ml  Output 1700 ml  Net -1460 ml    Filed Weights   09/04/21 1718 09/04/21 2201 09/05/21 0955  Weight: 79 kg 69.7 kg 71.2 kg    Examination: General exam: Appears comfortable  HEENT: PERRLA, oral mucosa moist, no sclera icterus or thrush Respiratory system: Clear to auscultation. Respiratory effort normal. Cardiovascular system: S1 & S2 heard, regular rate and rhythm Gastrointestinal system: Abdomen soft,  -tender in mid abdomen nondistended. Normal bowel sounds   Central nervous system: Alert and oriented. No focal neurological deficits. Extremities: No cyanosis, clubbing or edema Skin: No rashes or ulcers Psychiatry:  Mood & affect appropriate.      Data Reviewed: I have personally reviewed following labs and imaging studies  CBC: Recent Labs  Lab 09/04/21 1734 09/05/21 0330 09/06/21 0805 09/06/21 1617 09/07/21 1713 09/08/21 0427 09/08/21 0937 09/08/21 1324 09/09/21 0506  WBC 7.3 5.3 4.6   < > 5.5 7.6 7.9 8.0 7.9  NEUTROABS 5.6 3.5 2.7  --   --   --   --   --   --   HGB 14.2 12.6* 12.5*   < > 12.5* 12.6* 13.2 13.1 12.2*  HCT 43.4 38.9* 38.2*   < > 38.8* 38.7* 41.5 39.3 38.2*  MCV 87.0 88.6 86.8   < > 87.8 87.6 87.4 86.0 87.8  PLT 202 164 160   < > 199 200 226 227 230   < > = values in this interval not displayed.    Basic Metabolic  Panel: Recent Labs  Lab 09/05/21 0330 09/06/21 0805 09/07/21 0450 09/08/21 0427 09/09/21 0506  NA 141 141 139 140 137  K 3.3* 3.0* 2.8* 3.4* 3.2*  CL 109 107 105 107 103  CO2 25 26 27 26 24   GLUCOSE 217* 163* 183* 203* 204*  BUN 45* 28* 23 20 23   CREATININE 1.66* 1.35* 1.19 1.32* 1.32*  CALCIUM 8.9 8.7* 8.6* 8.5* 8.8*  MG  --  1.5*  1.4*  --  1.8  --   PHOS  --  2.3*  --   --   --     GFR: Estimated Creatinine Clearance: 43.9  mL/min (A) (by C-G formula based on SCr of 1.32 mg/dL (H)). Liver Function Tests: Recent Labs  Lab 09/04/21 1734 09/05/21 0330 09/06/21 0805  AST 14* 11* 12*  ALT 22 18 16   ALKPHOS 40 32* 29*  BILITOT 0.8 0.8 0.4  PROT 7.8 5.9* 6.2*  ALBUMIN 3.6 2.8* 2.7*      Cardiac Enzymes: Recent Labs  Lab 09/05/21 0330  CKTOTAL 45*      CBG: Recent Labs  Lab 09/08/21 1129 09/08/21 1627 09/08/21 2129 09/09/21 0738 09/09/21 1110  GLUCAP 153* 214* 208* 305* 149*    L  Thyroid Function Tests: No results for input(s): TSH, T4TOTAL, FREET4, T3FREE, THYROIDAB in the last 72 hours.    Urine analysis:    Component Value Date/Time   COLORURINE YELLOW 09/04/2021 1732   APPEARANCEUR CLEAR 09/04/2021 1732   LABSPEC 1.009 09/04/2021 1732   PHURINE 5.0 09/04/2021 1732   GLUCOSEU >=500 (A) 09/04/2021 1732   GLUCOSEU 100 (A) 11/20/2019 1100   HGBUR MODERATE (A) 09/04/2021 1732   BILIRUBINUR NEGATIVE 09/04/2021 1732   KETONESUR NEGATIVE 09/04/2021 1732   PROTEINUR 30 (A) 09/04/2021 1732   UROBILINOGEN 0.2 11/20/2019 1100   NITRITE NEGATIVE 09/04/2021 1732   LEUKOCYTESUR NEGATIVE 09/04/2021 1732      Radiology Studies: No results found.    Scheduled Meds:  (feeding supplement) PROSource Plus  30 mL Oral TID BM   Chlorhexidine Gluconate Cloth  6 each Topical Daily   ciprofloxacin  500 mg Oral BID   feeding supplement  237 mL Oral BID BM   finasteride  5 mg Oral Daily   hydrocortisone   Rectal BID   insulin aspart  0-9 Units  Subcutaneous TID WC   mesalamine  4 g Rectal BID   metoprolol succinate  50 mg Oral Daily   metroNIDAZOLE  500 mg Oral Q12H   multivitamin with minerals  1 tablet Oral Daily   terazosin  2 mg Oral Daily   Continuous Infusions:   LOS: 3 days      Debbe Odea, MD [facility] Pager: [email or web address] 09/09/2021, 3:31 PM

## 2021-09-09 NOTE — Progress Notes (Signed)
The bed alarm in pt room sounded. This RN went in to assist patient. Pt became frustrated with nurse. Nurse assured pt that I was just here to help him. Pt said "Well, don't help me." Patient's nurse came in and was able to assist pt to chair in room.

## 2021-09-09 NOTE — Progress Notes (Signed)
     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 GI pathogen panel was negative. Hopefully his diarrhea and hypokalemia will improve with treatment as initiated yesterday. He may need oral steroids if he fails local mesalamine and antibiotics.   The patient has hemorrhoids. But these are not the cause of his bleeding.   Plan: - Awaiting biopsy results from yesterday's flexible sigmoidoscopy - Rowasa enemas BID x 5 days, then QHS - Cipro and Flagyl x 10 days - Consider steroids if not responding to therapy - Diet as tolerated  Anticipate clinical improvement with therapy. I will arrange for outpatient follow-up with [name]. The inpatient GI team will move to stand-by. Please call with any additional questions or concerns during this hospitalization.   Subjective: No new physical complaints today. Frustrated by hospitalization, errors with food trays, and asking about going home. Multiple questions about the endoscopy and recommendations. Cousin was at the bedside.   Objective:  Vital signs in last 24 hours: Temp:  [97.8 F (36.6 C)-98.7 F (37.1 C)] 98 F (36.7 C) (11/26 0457) Pulse Rate:  [71-121] 78 (11/26 0457) Resp:  [12-26] 18 (11/26 0457) BP: (112-170)/(51-95) 149/70 (11/26 0457) SpO2:  [94 %-100 %] 95 % (11/26 0457) Last BM Date: 09/08/21 General:   Alert, in NAD. Sitting in the bedside chair.  Abdomen:  Soft. Nontender. Nondistended. Normal bowel sounds. No rebound or guarding. Neurologic:  Alert and  oriented x4;  grossly normal neurologically. Psych:  Alert and cooperative. Normal mood and affect.  Lab Results: Recent Labs    09/08/21 0937 09/08/21 1324 09/09/21 0506  WBC 7.9 8.0 7.9  HGB 13.2 13.1 12.2*  HCT 41.5  39.3 38.2*  PLT 226 227 230   BMET Recent Labs    09/07/21 0450 09/08/21 0427 09/09/21 0506  NA 139 140 137  K 2.8* 3.4* 3.2*  CL 105 107 103  CO2 27 26 24   GLUCOSE 183* 203* 204*  BUN 23 20 23   CREATININE 1.19 1.32* 1.32*  CALCIUM 8.6* 8.5* 8.8*      LOS: 3 days   [place]  09/09/2021, 8:09 AM

## 2021-09-09 NOTE — Progress Notes (Addendum)
Pt bed alarm went off. Walked in to assist pt and he was visibly upset. Asked pt was he okay and where was he trying to go. Then proceeded to assist pt, which he refused. Pt verbalized he wanted to go to the chair and began to yell about not being able to move on his own. Charge nurse came to assist, and pt verbalized he did not want me in the room anymore and demanded that I leave. Charge finished assisting pt to the chair. On call X. Blount notified and orders given. Report given to [place].

## 2021-09-10 ENCOUNTER — Encounter (HOSPITAL_COMMUNITY): Payer: Self-pay | Admitting: Gastroenterology

## 2021-09-10 DIAGNOSIS — N179 Acute kidney failure, unspecified: Secondary | ICD-10-CM | POA: Diagnosis not present

## 2021-09-10 DIAGNOSIS — K5289 Other specified noninfective gastroenteritis and colitis: Secondary | ICD-10-CM | POA: Diagnosis not present

## 2021-09-10 DIAGNOSIS — E860 Dehydration: Secondary | ICD-10-CM

## 2021-09-10 LAB — BASIC METABOLIC PANEL
Anion gap: 5 (ref 5–15)
BUN: 24 mg/dL — ABNORMAL HIGH (ref 8–23)
CO2: 25 mmol/L (ref 22–32)
Calcium: 8.8 mg/dL — ABNORMAL LOW (ref 8.9–10.3)
Chloride: 108 mmol/L (ref 98–111)
Creatinine, Ser: 1.26 mg/dL — ABNORMAL HIGH (ref 0.61–1.24)
GFR, Estimated: 58 mL/min — ABNORMAL LOW (ref >60)
Glucose, Bld: 216 mg/dL — ABNORMAL HIGH (ref 70–99)
Potassium: 3.7 mmol/L (ref 3.5–5.1)
Sodium: 138 mmol/L (ref 135–145)

## 2021-09-10 LAB — GLUCOSE, CAPILLARY
Glucose-Capillary: 200 mg/dL — ABNORMAL HIGH (ref 70–99)
Glucose-Capillary: 178 mg/dL — ABNORMAL HIGH (ref 70–99)
Glucose-Capillary: 119 mg/dL — ABNORMAL HIGH (ref 70–99)
Glucose-Capillary: 279 mg/dL — ABNORMAL HIGH (ref 70–99)

## 2021-09-10 MED ORDER — LORAZEPAM 2 MG/ML IJ SOLN
0.5000 mg | Freq: Once | INTRAMUSCULAR | Status: AC
  Administered 2021-09-10: 06:00:00 0.5 mg via INTRAVENOUS
  Filled 2021-09-10: qty 1

## 2021-09-10 MED ORDER — QUETIAPINE FUMARATE 25 MG PO TABS
25.0000 mg | ORAL_TABLET | Freq: Every day | ORAL | Status: DC
  Administered 2021-09-10: 21:00:00 25 mg via ORAL
  Filled 2021-09-10: qty 1

## 2021-09-10 NOTE — TOC Progression Note (Signed)
Transition of Care [facility]) - Progression Note    Patient Details  Name: Neil [name] MRN: [id number] Date of Birth: November 12, 1940  Transition of Care [facility]) CM/SW Contact  Ross Ludwig, [place] Phone Number: 09/10/2021, 4:22 PM  Clinical Narrative:     CSW was informed that PT is now recommending home health PT.  Patient and family did not have a preference of agencies, CSW contacted [facility] and they can accept patient.  CSW to continue to follow patient's progress throughout discharge planning.     Expected Discharge Plan: [place] Barriers to Discharge: Continued Medical Work up  Expected Discharge Plan and Services Expected Discharge Plan: [place]   Discharge Planning Services: CM Consult   Living arrangements for the past 2 months: Single Family Home                                       Social Determinants of Health (SDOH) Interventions    Readmission Risk Interventions No flowsheet data found.

## 2021-09-10 NOTE — Progress Notes (Addendum)
PROGRESS NOTE    CRYSTAL ELLWOOD II   RXV:[id number]  DOB: [birth year]  DOA: 09/04/2021 PCP: Biagio Borg, MD   Brief Narrative:  [name] a 80 year old elderly Caucasian male with a past medical history significant for but limited to diabetes mellitus type 2 with last hemoglobin A1c of 7.4 in August, hypertension, hyperlipidemia as well as other comorbidities including BPH, history of bursitis, CKD stage III, history of colonic polyps, history of CAD seen on CT scan as well as other comorbidities who presented to the hospital because of weakness and confusion that has been persistent.   He states that typically urinates every night but about a week ago, he stopped urinating at night and has subsequently just been "dribbling urine all over the place".  He subsequently also began to have diarrhea. In the ED he was found to have bladder outlet obstruction and an enlarged prostate.  CT scan was also suggestive of possible mild sigmoid wall thickening.  He had no leukocytosis.    BUN 58 creatinine 2.44 A Foley catheter was placed to help relieve obstruction and BUN and creatinine have subsequently been improving.  Subjective: He has no complaints. Per RN, he was getting out of bed on his own last night and quite agitated at times.   Assessment & Plan:   Principal Problem:   ARF (acute renal failure) (HCC)-CKD stage IIIb -Baseline creatinine is 1.35 and GFR is around 45-50 -Creatinine has been improving ever since Foley catheter was placed  Active Problems: Bladder outlet obstruction-possible BPH - Foley placed as mentioned above & Proscar started -CT scan revealed a markedly enlarged prostate gland mild bilateral hydronephrosis with perinephric and perivesical edema - The patient already takes terazosin at home and this was continued at a dose of 2 mg - Will do voiding trail today  GI bleed- acute- lower Diarrhea> acute colitis - presented with nonbloody diarrhea  -    finding of sigmoid colitis noted on CT scan -  bleeding per rectum then started on 11/23 and has persisted  - GI has been following this patient for his diarrhea    - 11/25> sigmoidoscopy reveals sigmoid colitis- GI has started Rowasa enemas BID, Cipro and Flagyl- diarrhea and bleeding seemsto be improving - stool for GI pathogens is negative  Hospital acquired delirium - required Aitvan 0.5 x 2 last night - will order a dose of Seroquel to prevent agitation tonight  Hypokalemia - replace and recheck - Mg noted to be 1.8  Hematuria - due to tugging on foley cath- resolvied  Acute confusion - ? If he had underlying dementia-  he is AAO on my exam but his brother states that he can tell that the patient is not at his baseline  Hypokalemia, hypomagnesemia - K 2.8 > 3.2- replacing  - Mg also low and being replaced    Essential hypertension, benign -Continue Toprol 50 mg daily -Benazepril on hold in setting of AKI    Diabetes mellitus type 2 in nonobese (HCC) -Continue sliding scale insulin -Glucophage on hold - last A1c on 05/18/21 was 7.4   Severe Generalized weakness/deconditioning  - will need SNF as he is quite weak  Abnormal thyroid function test - TSH noted to be 1.60, free T4 1.30 - Recommend these tests be rechecked as outpatient in 1 month  CKD 3b - stable  Time spent in minutes: 35 DVT prophylaxis: SCDs Code Status: full code Family Communication: brother at bedside Level of Care: Level of  care: med/surg Disposition Plan:  Status is: inpateint  The patient will require care spanning > 2 midnights and should be moved to inpatient because: GI bleed   Consultants:  GI Phone call with urology Procedures:  Foley cath Flex Sig 11/25 Antimicrobials:  Anti-infectives (From admission, onward)    Start     Dose/Rate Route Frequency Ordered Stop   09/08/21 1300  ciprofloxacin (CIPRO) tablet 500 mg        500 mg Oral 2 times daily 09/08/21 1041 09/18/21 0759    09/08/21 1300  metroNIDAZOLE (FLAGYL) tablet 500 mg        500 mg Oral Every 12 hours 09/08/21 1041 09/18/21 0959        Objective: Vitals:   09/09/21 1112 09/09/21 2047 09/10/21 0500 09/10/21 1245  BP: 137/66 138/89 134/78 (!) 155/83  Pulse: 80 94 81 73  Resp: 18 20 20 18   Temp: 98.1 F (36.7 C) 98.3 F (36.8 C) 98.5 F (36.9 C) (!) 97.5 F (36.4 C)  TempSrc: Oral Oral Oral Oral  SpO2: 95% 98% 91% 96%  Weight:      Height:        Intake/Output Summary (Last 24 hours) at 09/10/2021 1339 Last data filed at 09/10/2021 1200 Gross per 24 hour  Intake 240 ml  Output 425 ml  Net -185 ml    Filed Weights   09/04/21 1718 09/04/21 2201 09/05/21 0955  Weight: 79 kg 69.7 kg 71.2 kg    Examination: General exam: Appears comfortable  HEENT: PERRLA, oral mucosa moist, no sclera icterus or thrush Respiratory system: Clear to auscultation. Respiratory effort normal. Cardiovascular system: S1 & S2 heard, regular rate and rhythm Gastrointestinal system: Abdomen soft, non-tender, nondistended. Normal bowel sounds   Central nervous system: sleepy but oriented to situation - No focal neurological deficits. Extremities: No cyanosis, clubbing or edema Skin: No rashes or ulcers Psychiatry:  Mood & affect appropriate.       Data Reviewed: I have personally reviewed following labs and imaging studies  CBC: Recent Labs  Lab 09/04/21 1734 09/05/21 0330 09/06/21 0805 09/06/21 1617 09/07/21 1713 09/08/21 0427 09/08/21 0937 09/08/21 1324 09/09/21 0506  WBC 7.3 5.3 4.6   < > 5.5 7.6 7.9 8.0 7.9  NEUTROABS 5.6 3.5 2.7  --   --   --   --   --   --   HGB 14.2 12.6* 12.5*   < > 12.5* 12.6* 13.2 13.1 12.2*  HCT 43.4 38.9* 38.2*   < > 38.8* 38.7* 41.5 39.3 38.2*  MCV 87.0 88.6 86.8   < > 87.8 87.6 87.4 86.0 87.8  PLT 202 164 160   < > 199 200 226 227 230   < > = values in this interval not displayed.    Basic Metabolic Panel: Recent Labs  Lab 09/06/21 0805 09/07/21 0450  09/08/21 0427 09/09/21 0506 09/10/21 0645  NA 141 139 140 137 138  K 3.0* 2.8* 3.4* 3.2* 3.7  CL 107 105 107 103 108  CO2 26 27 26 24 25   GLUCOSE 163* 183* 203* 204* 216*  BUN 28* 23 20 23  24*  CREATININE 1.35* 1.19 1.32* 1.32* 1.26*  CALCIUM 8.7* 8.6* 8.5* 8.8* 8.8*  MG 1.5*  1.4*  --  1.8  --   --   PHOS 2.3*  --   --   --   --     GFR: Estimated Creatinine Clearance: 46 mL/min (A) (by C-G formula based on SCr of 1.26  mg/dL (H)). Liver Function Tests: Recent Labs  Lab 09/04/21 1734 09/05/21 0330 09/06/21 0805  AST 14* 11* 12*  ALT 22 18 16   ALKPHOS 40 32* 29*  BILITOT 0.8 0.8 0.4  PROT 7.8 5.9* 6.2*  ALBUMIN 3.6 2.8* 2.7*      Cardiac Enzymes: Recent Labs  Lab 09/05/21 0330  CKTOTAL 45*      CBG: Recent Labs  Lab 09/09/21 1110 09/09/21 1625 09/09/21 2233 09/10/21 0753 09/10/21 1133  GLUCAP 149* 207* 216* 200* 178*    L  Thyroid Function Tests: No results for input(s): TSH, T4TOTAL, FREET4, T3FREE, THYROIDAB in the last 72 hours.    Urine analysis:    Component Value Date/Time   COLORURINE YELLOW 09/04/2021 1732   APPEARANCEUR CLEAR 09/04/2021 1732   LABSPEC 1.009 09/04/2021 1732   PHURINE 5.0 09/04/2021 1732   GLUCOSEU >=500 (A) 09/04/2021 1732   GLUCOSEU 100 (A) 11/20/2019 1100   HGBUR MODERATE (A) 09/04/2021 1732   BILIRUBINUR NEGATIVE 09/04/2021 1732   KETONESUR NEGATIVE 09/04/2021 1732   PROTEINUR 30 (A) 09/04/2021 1732   UROBILINOGEN 0.2 11/20/2019 1100   NITRITE NEGATIVE 09/04/2021 1732   LEUKOCYTESUR NEGATIVE 09/04/2021 1732      Radiology Studies: No results found.    Scheduled Meds:  (feeding supplement) PROSource Plus  30 mL Oral TID BM   Chlorhexidine Gluconate Cloth  6 each Topical Daily   ciprofloxacin  500 mg Oral BID   feeding supplement  237 mL Oral BID BM   finasteride  5 mg Oral Daily   hydrocortisone   Rectal BID   insulin aspart  0-9 Units Subcutaneous TID WC   mesalamine  4 g Rectal BID   metoprolol  succinate  50 mg Oral Daily   metroNIDAZOLE  500 mg Oral Q12H   multivitamin with minerals  1 tablet Oral Daily   terazosin  2 mg Oral Daily   Continuous Infusions:   LOS: 4 days      Debbe Odea, MD [facility] Pager: [email or web address] 09/10/2021, 1:39 PM

## 2021-09-10 NOTE — Progress Notes (Signed)
Bed Alarm in patients room sounded. Went in to assist patient. Patient immediately said "Get the hell out of my room" as patient was having a bowel movement while walking to restroom. Assisted patient as he continued to yell and was ripping his clothes and telemetry off.  AC was contacted to talk to patient. AC came and informed patient of hospital policies. Patient educated on importance of telemetry and using the call light to inform staff of needs. Patient still refusing to wear and agrees to use call light. On call X. Blount notified.

## 2021-09-11 DIAGNOSIS — R338 Other retention of urine: Secondary | ICD-10-CM | POA: Diagnosis not present

## 2021-09-11 DIAGNOSIS — N401 Enlarged prostate with lower urinary tract symptoms: Secondary | ICD-10-CM | POA: Diagnosis not present

## 2021-09-11 DIAGNOSIS — R351 Nocturia: Secondary | ICD-10-CM | POA: Diagnosis not present

## 2021-09-11 DIAGNOSIS — N179 Acute kidney failure, unspecified: Secondary | ICD-10-CM | POA: Diagnosis not present

## 2021-09-11 DIAGNOSIS — K529 Noninfective gastroenteritis and colitis, unspecified: Secondary | ICD-10-CM

## 2021-09-11 LAB — SURGICAL PATHOLOGY

## 2021-09-11 LAB — GLUCOSE, CAPILLARY
Glucose-Capillary: 180 mg/dL — ABNORMAL HIGH (ref 70–99)
Glucose-Capillary: 277 mg/dL — ABNORMAL HIGH (ref 70–99)
Glucose-Capillary: 192 mg/dL — ABNORMAL HIGH (ref 70–99)

## 2021-09-11 MED ORDER — METRONIDAZOLE 500 MG PO TABS
500.0000 mg | ORAL_TABLET | Freq: Two times a day (BID) | ORAL | 0 refills | Status: DC
Start: 2021-09-11 — End: 2021-09-21

## 2021-09-11 MED ORDER — FINASTERIDE 5 MG PO TABS: 5.0000 mg | ORAL_TABLET | Freq: Every day | ORAL | 0 refills | Status: DC

## 2021-09-11 MED ORDER — ACETAMINOPHEN 325 MG PO TABS
650.0000 mg | ORAL_TABLET | Freq: Four times a day (QID) | ORAL | Status: DC | PRN
Start: 2021-09-11 — End: 2021-10-03

## 2021-09-11 MED ORDER — CIPROFLOXACIN HCL 500 MG PO TABS: 500.0000 mg | ORAL_TABLET | Freq: Two times a day (BID) | ORAL | 0 refills | Status: DC

## 2021-09-11 MED ORDER — ADULT MULTIVITAMIN W/MINERALS CH: 1.0000 | ORAL_TABLET | Freq: Every day | ORAL | 0 refills | Status: AC

## 2021-09-11 NOTE — Progress Notes (Incomplete)
Physician Discharge Summary  RILEY HALLUM [name] MWN:[id number] DOB: 12/31/1940 DOA: 09/04/2021  PCP: Biagio Borg, MD  Admit date: 09/04/2021 Discharge date: 09/11/2021  Admitted From: ***  Disposition:  ***   Recommendations for Outpatient Follow-up:  ***  Home Health:  ***  Discharge Condition:  ***   CODE STATUS:  ***   Diet recommendation:  *** Consultations: ***  Procedures/Studies: ***   Discharge Diagnoses:  Principal Problem:   ARF (acute renal failure) ([place]) Active Problems:   Essential hypertension, benign   Diabetes mellitus type 2 in nonobese (HCC)   Generalized weakness   GI bleed   AKI (acute kidney injury) ([place])   Protein-calorie malnutrition, severe   Abnormal CT of the abdomen   Noninfectious gastroenteritis      Brief Narrative:  Neil Ledger IIis a 80 year old elderly Caucasian male with a past medical history significant for but limited to diabetes mellitus type 2 with last hemoglobin A1c of 7.4 in August, hypertension, hyperlipidemia as well as other comorbidities including BPH, history of bursitis, CKD stage III, history of colonic polyps, history of CAD seen on CT scan as well as other comorbidities who presented to the hospital because of weakness and confusion that has been persistent.   He states that typically urinates every night but about a week ago, he stopped urinating at night and has subsequently just been "dribbling urine all over the place".  He subsequently also began to have diarrhea. In the ED he was found to have bladder outlet obstruction and an enlarged prostate.  CT scan was also suggestive of possible mild sigmoid wall thickening.  He had no leukocytosis.     BUN 58 creatinine 2.44 A Foley catheter was placed to help relieve obstruction and BUN and creatinine have subsequently been improving.   Subjective: He has no complaints. Per RN, he was getting out of bed on his own last night and quite agitated at times.     Assessment & Plan:   Principal Problem:   ARF (acute renal failure) (HCC)-CKD stage IIIb -Baseline creatinine is 1.35 and GFR is around 45-50 -Creatinine has been improving ever since Foley catheter was placed   Active Problems: Bladder outlet obstruction-possible BPH - Foley placed as mentioned above & Proscar started -CT scan revealed a markedly enlarged prostate gland mild bilateral hydronephrosis with perinephric and perivesical edema - The patient already takes terazosin at home and this was continued at a dose of 2 mg - Will do voiding trail today   GI bleed- acute- lower Diarrhea> acute colitis - presented with nonbloody diarrhea  -   finding of sigmoid colitis noted on CT scan -  bleeding per rectum then started on 11/23 and has persisted  - GI has been following this patient for his diarrhea    - 11/25> sigmoidoscopy reveals sigmoid colitis- GI has started Rowasa enemas BID, Cipro and Flagyl- diarrhea and bleeding seemsto be improving - stool for GI pathogens is negative   Hospital acquired delirium - required Aitvan 0.5 x 2 last night - will order a dose of Seroquel to prevent agitation tonight   Hypokalemia - replace and recheck - Mg noted to be 1.8   Hematuria - due to tugging on foley cath- resolvied   Acute confusion - ? If he had underlying dementia-  he is AAO on my exam but his brother states that he can tell that the patient is not at his baseline   Hypokalemia, hypomagnesemia - K 2.8 >  3.2- replacing  - Mg also low and being replaced     Essential hypertension, benign -Continue Toprol 50 mg daily -Benazepril on hold in setting of AKI     Diabetes mellitus type 2 in nonobese (HCC) -Continue sliding scale insulin -Glucophage on hold - last A1c on 05/18/21 was 7.4    Severe Generalized weakness/deconditioning  - will need SNF as he is quite weak   Abnormal thyroid function test - TSH noted to be 1.60, free T4 1.30 - Recommend these tests be  rechecked as outpatient in 1 month   CKD 3b - stable  rief Summary: ***  Hospital Course:  ***   Discharge Exam: Vitals:   09/10/21 2333 09/11/21 0455  BP: 126/68 (!) 156/74  Pulse: (!) 101 83  Resp: 20 16  Temp: 97.7 F (36.5 C) 97.9 F (36.6 C)  SpO2: 92% 99%   Vitals:   09/10/21 1245 09/10/21 2051 09/10/21 2333 09/11/21 0455  BP: (!) 155/83 (!) 147/87 126/68 (!) 156/74  Pulse: 73 (!) 104 (!) 101 83  Resp: 18 20 20 16   Temp: (!) 97.5 F (36.4 C) 98 F (36.7 C) 97.7 F (36.5 C) 97.9 F (36.6 C)  TempSrc: Oral Oral Oral Oral  SpO2: 96% 98% 92% 99%  Weight:      Height:        General: Pt is alert, awake, not in acute distress Cardiovascular: RRR, S1/S2 +, no rubs, no gallops Respiratory: CTA bilaterally, no wheezing, no rhonchi Abdominal: Soft, NT, ND, bowel sounds + Extremities: no edema, no cyanosis   Discharge Instructions   Allergies as of 09/11/2021       Reactions   Statins Other (See Comments)    muscle aches   Zanaflex [tizanidine] Other (See Comments)   Dizzy, sleepy   Zetia [ezetimibe] Other (See Comments)   Statins= Muscle and bone pain     Med Rec must be completed prior to using this [facility]***       Follow-up Information     [facility] Follow [facility].   Specialty: Home Health Services Why: [facility] will call to set up the first visit.               Allergies  Allergen Reactions   Statins Other (See Comments)     muscle aches   Zanaflex [Tizanidine] Other (See Comments)    Dizzy, sleepy   Zetia [Ezetimibe] Other (See Comments)    Statins= Muscle and bone pain      CT ABDOMEN PELVIS WO CONTRAST  Result Date: 09/04/2021 CLINICAL DATA:  Abdominal distension and pain. Acute urinary retention. EXAM: CT ABDOMEN AND PELVIS WITHOUT CONTRAST TECHNIQUE: Multidetector CT imaging of the abdomen and pelvis was performed following the standard protocol without IV contrast. COMPARISON:  CT 06/29/2015  FINDINGS: Lower chest: No acute airspace disease or pleural effusion. Upper normal heart size with coronary artery calcifications. Hepatobiliary: Focal fatty infiltration adjacent to the falciform ligament. No suspicious liver lesion on this unenhanced exam. Pneumobilia in the left lobe, unchanged. Cholecystectomy. Normal caliber common bile duct. Pancreas: No ductal dilatation or inflammation. Spleen: Normal in size without focal abnormality. Adrenals/Urinary Tract: No adrenal nodule. Mild bilateral hydronephrosis. Moderate bilateral perinephric edema, symmetric. No renal or ureteral calculi. Right retroperitoneal calcifications are adjacent to the right ureter. No evidence of focal renal lesion on this unenhanced exam. Foley catheter decompresses the urinary bladder. There is mild perivesicular edema. Stomach/Bowel: Mild wall thickening of the mid sigmoid colon, series 2,  image 50, but no pericolonic edema or inflammation. Moderate volume of colonic stool. No small bowel obstruction or inflammatory change. Normal appendix. Stomach is partially distended. Vascular/Lymphatic: Moderate aortic and branch atherosclerosis. No portal venous or mesenteric gas. Suspected retroaortic left renal vein. No bulky abdominopelvic adenopathy. Reproductive: Markedly enlarged prostate gland. Prostate measures 6.6 x 7.7 x 7.3 cm (volume = 190 cm^3). Other: Fat containing left inguinal hernia. Minimal fat in the right inguinal canal. No abdominal ascites or free air. Musculoskeletal: Unilateral left L5 pars defects. Multilevel degenerative change in the spine. There are no acute or suspicious osseous abnormalities. IMPRESSION: 1. Markedly enlarged prostate gland. Mild bilateral hydronephrosis with perinephric and perivesicular edema. Findings may be secondary to chronic bladder outlet obstruction or urinary tract infection. 2. Mild wall thickening of the mid sigmoid colon, but no pericolonic edema or inflammation. Recommend up-to-date  colonoscopy. Aortic Atherosclerosis (ICD10-I70.0). Electronically Signed   By: Keith Rake M.D.   On: 09/04/2021 19:06   DG Chest Portable 1 View  Result Date: 09/04/2021 CLINICAL DATA:  Weakness altered EXAM: PORTABLE CHEST 1 VIEW COMPARISON:  CT 03/02/2016 FINDINGS: The heart size and mediastinal contours are within normal limits. Both lungs are clear. The visualized skeletal structures are unremarkable. IMPRESSION: No active disease. Electronically Signed   By: Donavan Foil M.D.   On: 09/04/2021 18:42     The results of significant diagnostics from this hospitalization (including imaging, microbiology, ancillary and laboratory) are listed below for reference.     Microbiology: Recent Results (from the past 240 hour(s))  Urine Culture     Status: None   Collection Time: 09/04/21  5:32 PM   Specimen: Urine, Catheterized  Result Value Ref Range Status   Specimen Description   Final    URINE, CATHETERIZED Performed at [facility] [address] [place], [place] [address]    Special Requests   Final    NONE Performed at [facility], [place] [address], [place], [place] [address]    Culture   Final    NO GROWTH Performed at [facility], [place] [address] [place], [place] [address]    Report Status 09/05/2021 FINAL  Final  Resp Panel by RT-PCR (Flu A&B, Covid) Nasopharyngeal Swab     Status: None   Collection Time: 09/04/21  9:15 PM   Specimen: Nasopharyngeal Swab; Nasopharyngeal(NP) swabs in vial transport medium  Result Value Ref Range Status   SARS Coronavirus 2 by RT PCR NEGATIVE NEGATIVE Final    Comment: (NOTE) SARS-CoV-2 target nucleic acids are NOT DETECTED.  The SARS-CoV-2 RNA is generally detectable in upper respiratory specimens during the acute phase of infection. The lowest concentration of SARS-CoV-2 viral copies this assay can detect is 138 copies/mL. A negative result does not preclude SARS-Cov-2 infection and  should not be used as the sole basis for treatment or other patient management decisions. A negative result may occur with  improper specimen collection/handling, submission of specimen other than nasopharyngeal swab, presence of viral mutation(s) within the areas targeted by this assay, and inadequate number of viral copies(<138 copies/mL). A negative result must be combined with clinical observations, patient history, and epidemiological information. The expected result is Negative.  Fact Sheet for Patients:  EntrepreneurPulse.com.au  Fact Sheet for Healthcare Providers:  IncredibleEmployment.be  This test is no t yet approved or cleared by the [place] FDA and  has been authorized for detection and/or diagnosis of SARS-CoV-2 by FDA under an Emergency Use Authorization (EUA).  This EUA will remain  in effect (meaning this test can be used) for the duration of the COVID-19 declaration under Section 564(b)(1) of the Act, 21 U.S.C.section 360bbb-3(b)(1), unless the authorization is terminated  or revoked sooner.       Influenza A by PCR NEGATIVE NEGATIVE Final   Influenza B by PCR NEGATIVE NEGATIVE Final    Comment: (NOTE) The Xpert Xpress SARS-CoV-2/FLU/RSV plus assay is intended as an aid in the diagnosis of influenza from Nasopharyngeal swab specimens and should not be used as a sole basis for treatment. Nasal washings and aspirates are unacceptable for Xpert Xpress SARS-CoV-2/FLU/RSV testing.  Fact Sheet for Patients: EntrepreneurPulse.com.au  Fact Sheet for Healthcare Providers: IncredibleEmployment.be  This test is not yet approved or cleared by the [place] FDA and has been authorized for detection and/or diagnosis of SARS-CoV-2 by FDA under an Emergency Use Authorization (EUA). This EUA will remain in effect (meaning this test can be used) for the duration of the COVID-19 declaration  under Section 564(b)(1) of the Act, 21 U.S.C. section 360bbb-3(b)(1), unless the authorization is terminated or revoked.  Performed at [facility], [place] [address] [place], [place] [address]   Gastrointestinal Panel by PCR , Stool     Status: None   Collection Time: 09/05/21  1:45 PM   Specimen: Stool  Result Value Ref Range Status   Campylobacter species NOT DETECTED NOT DETECTED Final   Plesimonas shigelloides NOT DETECTED NOT DETECTED Final   Salmonella species NOT DETECTED NOT DETECTED Final   Yersinia enterocolitica NOT DETECTED NOT DETECTED Final   Vibrio species NOT DETECTED NOT DETECTED Final   Vibrio cholerae NOT DETECTED NOT DETECTED Final   Enteroaggregative E coli (EAEC) NOT DETECTED NOT DETECTED Final   Enteropathogenic E coli (EPEC) NOT DETECTED NOT DETECTED Final   Enterotoxigenic E coli (ETEC) NOT DETECTED NOT DETECTED Final   Shiga like toxin producing E coli (STEC) NOT DETECTED NOT DETECTED Final   Shigella/Enteroinvasive E coli (EIEC) NOT DETECTED NOT DETECTED Final   Cryptosporidium NOT DETECTED NOT DETECTED Final   Cyclospora cayetanensis NOT DETECTED NOT DETECTED Final   Entamoeba histolytica NOT DETECTED NOT DETECTED Final   Giardia lamblia NOT DETECTED NOT DETECTED Final   Adenovirus F40/41 NOT DETECTED NOT DETECTED Final   Astrovirus NOT DETECTED NOT DETECTED Final   Norovirus GI/GII NOT DETECTED NOT DETECTED Final   Rotavirus A NOT DETECTED NOT DETECTED Final   Sapovirus (I, [name], IV, and V) NOT DETECTED NOT DETECTED Final    Comment: Performed at [facility], [place] [place], [place] [address]     Labs: BNP (last 3 results) No results for input(s): BNP in the last 8760 hours. Basic Metabolic Panel: Recent Labs  Lab 09/06/21 0805 09/07/21 0450 09/08/21 0427 09/09/21 0506 09/10/21 0645  NA 141 139 140 137 138  K 3.0* 2.8* 3.4* 3.2* 3.7  CL 107 105 107 103 108  CO2 26 27 26 24 25   GLUCOSE 163* 183* 203* 204*  216*  BUN 28* 23 20 23  24*  CREATININE 1.35* 1.19 1.32* 1.32* 1.26*  CALCIUM 8.7* 8.6* 8.5* 8.8* 8.8*  MG 1.5*   1.4*  --  1.8  --   --   PHOS 2.3*  --   --   --   --    Liver Function Tests: Recent Labs  Lab 09/04/21 1734 09/05/21 0330 09/06/21 0805  AST 14* 11* 12*  ALT 22 18 16   ALKPHOS 40 32* 29*  BILITOT 0.8 0.8 0.4  PROT 7.8 5.9* 6.2*  ALBUMIN 3.6 2.8* 2.7*   No results for input(s): LIPASE, AMYLASE in the last 168 hours. No results for input(s): AMMONIA in the last 168 hours. CBC: Recent Labs  Lab 09/04/21 1734 09/05/21 0330 09/06/21 0805 09/06/21 1617 09/07/21 1713 09/08/21 0427 09/08/21 0937 09/08/21 1324 09/09/21 0506  WBC 7.3 5.3 4.6   < > 5.5 7.6 7.9 8.0 7.9  NEUTROABS 5.6 3.5 2.7  --   --   --   --   --   --   HGB 14.2 12.6* 12.5*   < > 12.5* 12.6* 13.2 13.1 12.2*  HCT 43.4 38.9* 38.2*   < > 38.8* 38.7* 41.5 39.3 38.2*  MCV 87.0 88.6 86.8   < > 87.8 87.6 87.4 86.0 87.8  PLT 202 164 160   < > 199 200 226 227 230   < > = values in this interval not displayed.   Cardiac Enzymes: Recent Labs  Lab 09/05/21 0330  CKTOTAL 45*   BNP: Invalid input(s): POCBNP CBG: Recent Labs  Lab 09/10/21 1133 09/10/21 1636 09/10/21 2049 09/11/21 0725 09/11/21 1204  GLUCAP 178* 119* 279* 180* 277*   D-Dimer No results for input(s): DDIMER in the last 72 hours. Hgb A1c No results for input(s): HGBA1C in the last 72 hours. Lipid Profile No results for input(s): CHOL, HDL, LDLCALC, TRIG, CHOLHDL, LDLDIRECT in the last 72 hours. Thyroid function studies No results for input(s): TSH, T4TOTAL, T3FREE, THYROIDAB in the last 72 hours.  Invalid input(s): FREET3 Anemia work up No results for input(s): VITAMINB12, FOLATE, FERRITIN, TIBC, IRON, RETICCTPCT in the last 72 hours. Urinalysis    Component Value Date/Time   COLORURINE YELLOW 09/04/2021 1732   APPEARANCEUR CLEAR 09/04/2021 1732   LABSPEC 1.009 09/04/2021 1732   PHURINE 5.0 09/04/2021 1732   GLUCOSEU  >=500 (A) 09/04/2021 1732   GLUCOSEU 100 (A) 11/20/2019 1100   HGBUR MODERATE (A) 09/04/2021 1732   BILIRUBINUR NEGATIVE 09/04/2021 1732   KETONESUR NEGATIVE 09/04/2021 1732   PROTEINUR 30 (A) 09/04/2021 1732   UROBILINOGEN 0.2 11/20/2019 1100   NITRITE NEGATIVE 09/04/2021 1732   LEUKOCYTESUR NEGATIVE 09/04/2021 1732   Sepsis Labs Invalid input(s): PROCALCITONIN,  WBC,  LACTICIDVEN Microbiology Recent Results (from the past 240 hour(s))  Urine Culture     Status: None   Collection Time: 09/04/21  5:32 PM   Specimen: Urine, Catheterized  Result Value Ref Range Status   Specimen Description   Final    URINE, CATHETERIZED Performed at [facility], [place] [address], [place], [place] [address]    Special Requests   Final    NONE Performed at [facility], [place] [address] [place], [place] [address]    Culture   Final    NO GROWTH Performed at [facility], [place] [address], [place], [place] [address]    Report Status 09/05/2021 FINAL  Final  Resp Panel by RT-PCR (Flu A&B, Covid) Nasopharyngeal Swab     Status: None   Collection Time: 09/04/21  9:15 PM   Specimen: Nasopharyngeal Swab; Nasopharyngeal(NP) swabs in vial transport medium  Result Value Ref Range Status   SARS Coronavirus 2 by RT PCR NEGATIVE NEGATIVE Final    Comment: (NOTE) SARS-CoV-2 target nucleic acids are NOT DETECTED.  The SARS-CoV-2 RNA is generally detectable in upper respiratory specimens during the acute phase of infection. The lowest concentration of SARS-CoV-2 viral copies this assay can detect is 138  copies/mL. A negative result does not preclude SARS-Cov-2 infection and should not be used as the sole basis for treatment or other patient management decisions. A negative result may occur with  improper specimen collection/handling, submission of specimen other than nasopharyngeal swab, presence of viral mutation(s) within the areas targeted by this assay, and  inadequate number of viral copies(<138 copies/mL). A negative result must be combined with clinical observations, patient history, and epidemiological information. The expected result is Negative.  Fact Sheet for Patients:  EntrepreneurPulse.com.au  Fact Sheet for Healthcare Providers:  IncredibleEmployment.be  This test is no t yet approved or cleared by the [place] FDA and  has been authorized for detection and/or diagnosis of SARS-CoV-2 by FDA under an Emergency Use Authorization (EUA). This EUA will remain  in effect (meaning this test can be used) for the duration of the COVID-19 declaration under Section 564(b)(1) of the Act, 21 U.S.C.section 360bbb-3(b)(1), unless the authorization is terminated  or revoked sooner.       Influenza A by PCR NEGATIVE NEGATIVE Final   Influenza B by PCR NEGATIVE NEGATIVE Final    Comment: (NOTE) The Xpert Xpress SARS-CoV-2/FLU/RSV plus assay is intended as an aid in the diagnosis of influenza from Nasopharyngeal swab specimens and should not be used as a sole basis for treatment. Nasal washings and aspirates are unacceptable for Xpert Xpress SARS-CoV-2/FLU/RSV testing.  Fact Sheet for Patients: EntrepreneurPulse.com.au  Fact Sheet for Healthcare Providers: IncredibleEmployment.be  This test is not yet approved or cleared by the [place] FDA and has been authorized for detection and/or diagnosis of SARS-CoV-2 by FDA under an Emergency Use Authorization (EUA). This EUA will remain in effect (meaning this test can be used) for the duration of the COVID-19 declaration under Section 564(b)(1) of the Act, 21 U.S.C. section 360bbb-3(b)(1), unless the authorization is terminated or revoked.  Performed at [facility], [place] [address], [place], [place] [address]   Gastrointestinal Panel by PCR , Stool     Status: None   Collection Time:  09/05/21  1:45 PM   Specimen: Stool  Result Value Ref Range Status   Campylobacter species NOT DETECTED NOT DETECTED Final   Plesimonas shigelloides NOT DETECTED NOT DETECTED Final   Salmonella species NOT DETECTED NOT DETECTED Final   Yersinia enterocolitica NOT DETECTED NOT DETECTED Final   Vibrio species NOT DETECTED NOT DETECTED Final   Vibrio cholerae NOT DETECTED NOT DETECTED Final   Enteroaggregative E coli (EAEC) NOT DETECTED NOT DETECTED Final   Enteropathogenic E coli (EPEC) NOT DETECTED NOT DETECTED Final   Enterotoxigenic E coli (ETEC) NOT DETECTED NOT DETECTED Final   Shiga like toxin producing E coli (STEC) NOT DETECTED NOT DETECTED Final   Shigella/Enteroinvasive E coli (EIEC) NOT DETECTED NOT DETECTED Final   Cryptosporidium NOT DETECTED NOT DETECTED Final   Cyclospora cayetanensis NOT DETECTED NOT DETECTED Final   Entamoeba histolytica NOT DETECTED NOT DETECTED Final   Giardia lamblia NOT DETECTED NOT DETECTED Final   Adenovirus F40/41 NOT DETECTED NOT DETECTED Final   Astrovirus NOT DETECTED NOT DETECTED Final   Norovirus GI/GII NOT DETECTED NOT DETECTED Final   Rotavirus A NOT DETECTED NOT DETECTED Final   Sapovirus (I, [name], IV, and V) NOT DETECTED NOT DETECTED Final    Comment: Performed at [facility], [address], [place], [place] [address]     Time coordinating discharge in minutes: ***  SIGNED:   Debbe Odea, MD  [facility] 09/11/2021, 1:20 PM

## 2021-09-11 NOTE — Progress Notes (Signed)
Patient waiting for brother to come to hospital to take him home.  His brother is planning to stay with him tonight.  Discharge instructions and foley care will be shown to patient's brother upon his arrival to the patients room. Brother is expected to come between 5:30- 6 pm per patient.

## 2021-09-11 NOTE — Progress Notes (Signed)
[facility] to make f/u appt for patient being d/c home with foley cath for urinary retention.   First available appt 09/26/21 at 10:45 a.m.

## 2021-09-11 NOTE — Discharge Summary (Signed)
Physician Discharge Summary  [name] DOB: [birth year] DOA: 09/04/2021  PCP: Biagio Borg, MD  Admit date: 09/04/2021 Discharge date: 09/11/2021  Admitted From: home  Disposition:  home   Recommendations for Outpatient Follow-up:  He will f/u with Urology for urinary retention Will need to f/u with [facility] for sigmoid colitis  Home Health:  ordered  Discharge Condition:  stable   CODE STATUS:  Full code   Diet recommendation:  diabetic diet Consultations: GI Urology  Procedures/Studies: Flex Sig Foley cath placement   Discharge Diagnoses:  Principal Problem:   ARF (acute renal failure) ([place]) Active Problems:   Colitis - GI bleed   Acute urinary retention   BPH (benign prostatic hyperplasia)   Essential hypertension, benign   Diabetes mellitus type 2 in nonobese ([place])   Generalized weakness   Protein-calorie malnutrition, severe     Brief Narrative:  Neil Ledger IIis a 80 year old elderly Caucasian male with a past medical history significant for but limited to diabetes mellitus type 2 with last hemoglobin A1c of 7.4 in August, hypertension, hyperlipidemia as well as other comorbidities including BPH, history of bursitis, CKD stage III, history of colonic polyps, history of CAD seen on CT scan as well as other comorbidities who presented to the hospital because of weakness and confusion that has been persistent.   He states that typically urinates every night but about a week ago, he stopped urinating at night and has subsequently just been "dribbling urine all over the place".  He subsequently also began to have diarrhea. In the ED he was found to have bladder outlet obstruction and an enlarged prostate.  CT scan was also suggestive of possible mild sigmoid wall thickening.  He had no leukocytosis.     BUN 58 creatinine 2.44 A Foley catheter was placed to help relieve obstruction and BUN and creatinine have subsequently been improving.    Subjective: He has no complaints. Per RN, he was getting out of bed on his own last night and quite agitated at times.    Assessment & Plan:   Principal Problem:   ARF (acute renal failure) (HCC)-CKD stage IIIb -Baseline creatinine is 1.35 and GFR is around 45-50 -Creatinine has been improving ever since Foley catheter was placed   Active Problems: Bladder outlet obstruction-possible BPH - Foley placed as mentioned above & Proscar started -CT scan revealed a markedly enlarged prostate gland mild bilateral hydronephrosis with perinephric and perivesical edema - The patient already takes terazosin at home and this was continued at a dose of 2 mg - Voiding trail done on 11/27 but Foley had to be replaced as he was unable to void - arrangement made to f/u with Urology as outpt   Diarrhea> acute colitis with GI bleed - initially presented with nonbloody diarrhea and finding of (possible) sigmoid colitis noted on CT scan- GI was consulted and we decided to hold off on antibiotics -  bleeding per rectum then started on 11/23   - 11/25> sigmoidoscopy reveals sigmoid colitis- GI started Rowasa enemas BID, Cipro and Flagyl - diarrhea and bleeding have improved- OK to stop Rowasa per GI - I will give him scripts for Cipro and Flagyl to complete 7 days total - stool for GI pathogens is negative - He will receive a call by [facility] to follow up as [facility] acquired delirium - improving   Hematuria - due to tugging on foley cath- resolvied   Hypokalemia, hypomagnesemia -  K 2.8 > 3.2- replaced  - Mg also low and has been replaced     Essential hypertension, benign -Continue Toprol 50 mg daily -Benazepril on hold in setting of AKI- can resume on dc     Diabetes mellitus type 2 in nonobese (HCC) -Continue sliding scale insulin -Glucophage on hold - last A1c on 05/18/21 was 7.4    Abnormal thyroid function test - TSH noted to be 1.60, free T4 1.30 - Recommend these tests be  rechecked as outpatient in 1 month       Discharge Exam: Vitals:   09/10/21 2333 09/11/21 0455  BP: 126/68 (!) 156/74  Pulse: (!) 101 83  Resp: 20 16  Temp: 97.7 F (36.5 C) 97.9 F (36.6 C)  SpO2: 92% 99%   Vitals:   09/10/21 1245 09/10/21 2051 09/10/21 2333 09/11/21 0455  BP: (!) 155/83 (!) 147/87 126/68 (!) 156/74  Pulse: 73 (!) 104 (!) 101 83  Resp: 18 20 20 16   Temp: (!) 97.5 F (36.4 C) 98 F (36.7 C) 97.7 F (36.5 C) 97.9 F (36.6 C)  TempSrc: Oral Oral Oral Oral  SpO2: 96% 98% 92% 99%  Weight:      Height:        General: Pt is alert, awake, not in acute distress Cardiovascular: RRR, S1/S2 +, no rubs, no gallops Respiratory: CTA bilaterally, no wheezing, no rhonchi Abdominal: Soft, NT, ND, bowel sounds + Extremities: no edema, no cyanosis   Discharge Instructions  Discharge Instructions     Diet Carb Modified   Complete by: As directed    Increase activity slowly   Complete by: As directed       Allergies as of 09/11/2021       Reactions   Statins Other (See Comments)    muscle aches   Zanaflex [tizanidine] Other (See Comments)   Dizzy, sleepy   Zetia [ezetimibe] Other (See Comments)   Statins= Muscle and bone pain        Medication List     STOP taking these medications    IBU-200 PO       TAKE these medications    acetaminophen 500 MG tablet Commonly known as: TYLENOL Take 1,500 mg by mouth every 6 (six) hours as needed for mild pain. What changed: Another medication with the same name was added. Make sure you understand how and when to take each.   acetaminophen 325 MG tablet Commonly known as: TYLENOL Take 2 tablets (650 mg total) by mouth every 6 (six) hours as needed for mild pain (or Fever >/= 101). What changed: You were already taking a medication with the same name, and this prescription was added. Make sure you understand how and when to take each.   aspirin 81 MG EC tablet Take 81 mg by mouth daily.   B-12  1000 MCG Tabs Take 1,000 mcg by mouth daily.   BEANO PO Take 1 tablet by mouth daily as needed (flatulence).   benazepril 40 MG tablet Commonly known as: LOTENSIN TAKE 1 TABLET BY MOUTH  DAILY   cholecalciferol 1000 units tablet Commonly known as: VITAMIN D Take 1,000 Units by mouth 2 (two) times daily.   ciprofloxacin 500 MG tablet Commonly known as: CIPRO Take 1 tablet (500 mg total) by mouth 2 (two) times daily.   citalopram 10 MG tablet Commonly known as: CELEXA TAKE 1 TABLET BY MOUTH  DAILY   cyclobenzaprine 5 MG tablet Commonly known as: FLEXERIL TAKE 1 TABLET BY MOUTH  3  TIMES DAILY AS NEEDED FOR  MUSCLE SPASM(S) What changed: See the new instructions.   fenofibrate 160 MG tablet TAKE 1 TABLET BY MOUTH AT  BEDTIME   finasteride 5 MG tablet Commonly known as: PROSCAR Take 1 tablet (5 mg total) by mouth daily. Start taking on: September 12, 2021   metFORMIN 500 MG 24 hr tablet Commonly known as: GLUCOPHAGE-XR Take 1 tablet (500 mg total) by mouth daily with breakfast.   metoprolol succinate 50 MG 24 hr tablet Commonly known as: TOPROL-XL Take 1 tablet (50 mg total) by mouth daily. Take with or immediately following a meal.   metroNIDAZOLE 500 MG tablet Commonly known as: FLAGYL Take 1 tablet (500 mg total) by mouth every 12 (twelve) hours.   multivitamin with minerals Tabs tablet Take 1 tablet by mouth daily. Start taking on: September 12, 2021   sodium chloride 0.65 % nasal spray Commonly known as: OCEAN Place 1 spray into the nose daily as needed for congestion.   terazosin 2 MG capsule Commonly known as: HYTRIN TAKE 1 CAPSULE BY MOUTH  DAILY        Follow-up Information     [facility].   Specialty: Home Health Services Why: [facility] will call to set up the first visit.               Allergies  Allergen Reactions   Statins Other (See Comments)     muscle aches   Zanaflex [Tizanidine] Other (See  Comments)    Dizzy, sleepy   Zetia [Ezetimibe] Other (See Comments)    Statins= Muscle and bone pain      CT ABDOMEN PELVIS WO CONTRAST  Result Date: 09/04/2021 CLINICAL DATA:  Abdominal distension and pain. Acute urinary retention. EXAM: CT ABDOMEN AND PELVIS WITHOUT CONTRAST TECHNIQUE: Multidetector CT imaging of the abdomen and pelvis was performed following the standard protocol without IV contrast. COMPARISON:  CT 06/29/2015 FINDINGS: Lower chest: No acute airspace disease or pleural effusion. Upper normal heart size with coronary artery calcifications. Hepatobiliary: Focal fatty infiltration adjacent to the falciform ligament. No suspicious liver lesion on this unenhanced exam. Pneumobilia in the left lobe, unchanged. Cholecystectomy. Normal caliber common bile duct. Pancreas: No ductal dilatation or inflammation. Spleen: Normal in size without focal abnormality. Adrenals/Urinary Tract: No adrenal nodule. Mild bilateral hydronephrosis. Moderate bilateral perinephric edema, symmetric. No renal or ureteral calculi. Right retroperitoneal calcifications are adjacent to the right ureter. No evidence of focal renal lesion on this unenhanced exam. Foley catheter decompresses the urinary bladder. There is mild perivesicular edema. Stomach/Bowel: Mild wall thickening of the mid sigmoid colon, series 2, image 50, but no pericolonic edema or inflammation. Moderate volume of colonic stool. No small bowel obstruction or inflammatory change. Normal appendix. Stomach is partially distended. Vascular/Lymphatic: Moderate aortic and branch atherosclerosis. No portal venous or mesenteric gas. Suspected retroaortic left renal vein. No bulky abdominopelvic adenopathy. Reproductive: Markedly enlarged prostate gland. Prostate measures 6.6 x 7.7 x 7.3 cm (volume = 190 cm^3). Other: Fat containing left inguinal hernia. Minimal fat in the right inguinal canal. No abdominal ascites or free air. Musculoskeletal: Unilateral  left L5 pars defects. Multilevel degenerative change in the spine. There are no acute or suspicious osseous abnormalities. IMPRESSION: 1. Markedly enlarged prostate gland. Mild bilateral hydronephrosis with perinephric and perivesicular edema. Findings may be secondary to chronic bladder outlet obstruction or urinary tract infection. 2. Mild wall thickening of the mid sigmoid colon, but no pericolonic edema or inflammation. Recommend  up-to-date colonoscopy. Aortic Atherosclerosis (ICD10-I70.0). Electronically Signed   By: Keith Rake M.D.   On: 09/04/2021 19:06   DG Chest Portable 1 View  Result Date: 09/04/2021 CLINICAL DATA:  Weakness altered EXAM: PORTABLE CHEST 1 VIEW COMPARISON:  CT 03/02/2016 FINDINGS: The heart size and mediastinal contours are within normal limits. Both lungs are clear. The visualized skeletal structures are unremarkable. IMPRESSION: No active disease. Electronically Signed   By: Donavan Foil M.D.   On: 09/04/2021 18:42     The results of significant diagnostics from this hospitalization (including imaging, microbiology, ancillary and laboratory) are listed below for reference.     Microbiology: Recent Results (from the past 240 hour(s))  Urine Culture     Status: None   Collection Time: 09/04/21  5:32 PM   Specimen: Urine, Catheterized  Result Value Ref Range Status   Specimen Description   Final    URINE, CATHETERIZED Performed at [facility] [address] [place], [place] [address]    Special Requests   Final    NONE Performed at [facility], [place] [address], [place], [place] [address]    Culture   Final    NO GROWTH Performed at [facility], [place] [address] [place], [place] [address]    Report Status 09/05/2021 FINAL  Final  Resp Panel by RT-PCR (Flu A&B, Covid) Nasopharyngeal Swab     Status: None   Collection Time: 09/04/21  9:15 PM   Specimen: Nasopharyngeal Swab; Nasopharyngeal(NP) swabs in vial  transport medium  Result Value Ref Range Status   SARS Coronavirus 2 by RT PCR NEGATIVE NEGATIVE Final    Comment: (NOTE) SARS-CoV-2 target nucleic acids are NOT DETECTED.  The SARS-CoV-2 RNA is generally detectable in upper respiratory specimens during the acute phase of infection. The lowest concentration of SARS-CoV-2 viral copies this assay can detect is 138 copies/mL. A negative result does not preclude SARS-Cov-2 infection and should not be used as the sole basis for treatment or other patient management decisions. A negative result may occur with  improper specimen collection/handling, submission of specimen other than nasopharyngeal swab, presence of viral mutation(s) within the areas targeted by this assay, and inadequate number of viral copies(<138 copies/mL). A negative result must be combined with clinical observations, patient history, and epidemiological information. The expected result is Negative.  Fact Sheet for Patients:  EntrepreneurPulse.com.au  Fact Sheet for Healthcare Providers:  IncredibleEmployment.be  This test is no t yet approved or cleared by the [place] FDA and  has been authorized for detection and/or diagnosis of SARS-CoV-2 by FDA under an Emergency Use Authorization (EUA). This EUA will remain  in effect (meaning this test can be used) for the duration of the COVID-19 declaration under Section 564(b)(1) of the Act, 21 U.S.C.section 360bbb-3(b)(1), unless the authorization is terminated  or revoked sooner.       Influenza A by PCR NEGATIVE NEGATIVE Final   Influenza B by PCR NEGATIVE NEGATIVE Final    Comment: (NOTE) The Xpert Xpress SARS-CoV-2/FLU/RSV plus assay is intended as an aid in the diagnosis of influenza from Nasopharyngeal swab specimens and should not be used as a sole basis for treatment. Nasal washings and aspirates are unacceptable for Xpert Xpress SARS-CoV-2/FLU/RSV testing.  Fact  Sheet for Patients: EntrepreneurPulse.com.au  Fact Sheet for Healthcare Providers: IncredibleEmployment.be  This test is not yet approved or cleared by the [place] FDA and has been authorized for detection and/or diagnosis of SARS-CoV-2 by FDA  under an Emergency Use Authorization (EUA). This EUA will remain in effect (meaning this test can be used) for the duration of the COVID-19 declaration under Section 564(b)(1) of the Act, 21 U.S.C. section 360bbb-3(b)(1), unless the authorization is terminated or revoked.  Performed at [facility], [place] [address] [place], [place] [address]   Gastrointestinal Panel by PCR , Stool     Status: None   Collection Time: 09/05/21  1:45 PM   Specimen: Stool  Result Value Ref Range Status   Campylobacter species NOT DETECTED NOT DETECTED Final   Plesimonas shigelloides NOT DETECTED NOT DETECTED Final   Salmonella species NOT DETECTED NOT DETECTED Final   Yersinia enterocolitica NOT DETECTED NOT DETECTED Final   Vibrio species NOT DETECTED NOT DETECTED Final   Vibrio cholerae NOT DETECTED NOT DETECTED Final   Enteroaggregative E coli (EAEC) NOT DETECTED NOT DETECTED Final   Enteropathogenic E coli (EPEC) NOT DETECTED NOT DETECTED Final   Enterotoxigenic E coli (ETEC) NOT DETECTED NOT DETECTED Final   Shiga like toxin producing E coli (STEC) NOT DETECTED NOT DETECTED Final   Shigella/Enteroinvasive E coli (EIEC) NOT DETECTED NOT DETECTED Final   Cryptosporidium NOT DETECTED NOT DETECTED Final   Cyclospora cayetanensis NOT DETECTED NOT DETECTED Final   Entamoeba histolytica NOT DETECTED NOT DETECTED Final   Giardia lamblia NOT DETECTED NOT DETECTED Final   Adenovirus F40/41 NOT DETECTED NOT DETECTED Final   Astrovirus NOT DETECTED NOT DETECTED Final   Norovirus GI/GII NOT DETECTED NOT DETECTED Final   Rotavirus A NOT DETECTED NOT DETECTED Final   Sapovirus (I, [name], IV, and V) NOT DETECTED  NOT DETECTED Final    Comment: Performed at [facility], [place] [place], [place] [address]     Labs: BNP (last 3 results) No results for input(s): BNP in the last 8760 hours. Basic Metabolic Panel: Recent Labs  Lab 09/06/21 0805 09/07/21 0450 09/08/21 0427 09/09/21 0506 09/10/21 0645  NA 141 139 140 137 138  K 3.0* 2.8* 3.4* 3.2* 3.7  CL 107 105 107 103 108  CO2 26 27 26 24 25   GLUCOSE 163* 183* 203* 204* 216*  BUN 28* 23 20 23  24*  CREATININE 1.35* 1.19 1.32* 1.32* 1.26*  CALCIUM 8.7* 8.6* 8.5* 8.8* 8.8*  MG 1.5*  1.4*  --  1.8  --   --   PHOS 2.3*  --   --   --   --    Liver Function Tests: Recent Labs  Lab 09/04/21 1734 09/05/21 0330 09/06/21 0805  AST 14* 11* 12*  ALT 22 18 16   ALKPHOS 40 32* 29*  BILITOT 0.8 0.8 0.4  PROT 7.8 5.9* 6.2*  ALBUMIN 3.6 2.8* 2.7*   No results for input(s): LIPASE, AMYLASE in the last 168 hours. No results for input(s): AMMONIA in the last 168 hours. CBC: Recent Labs  Lab 09/04/21 1734 09/05/21 0330 09/06/21 0805 09/06/21 1617 09/07/21 1713 09/08/21 0427 09/08/21 0937 09/08/21 1324 09/09/21 0506  WBC 7.3 5.3 4.6   < > 5.5 7.6 7.9 8.0 7.9  NEUTROABS 5.6 3.5 2.7  --   --   --   --   --   --   HGB 14.2 12.6* 12.5*   < > 12.5* 12.6* 13.2 13.1 12.2*  HCT 43.4 38.9* 38.2*   < > 38.8* 38.7* 41.5 39.3 38.2*  MCV 87.0 88.6 86.8   < > 87.8 87.6 87.4 86.0 87.8  PLT 202 164 160   < >  199 200 226 227 230   < > = values in this interval not displayed.   Cardiac Enzymes: Recent Labs  Lab 09/05/21 0330  CKTOTAL 45*   BNP: Invalid input(s): POCBNP CBG: Recent Labs  Lab 09/10/21 1133 09/10/21 1636 09/10/21 2049 09/11/21 0725 09/11/21 1204  GLUCAP 178* 119* 279* 180* 277*   D-Dimer No results for input(s): DDIMER in the last 72 hours. Hgb A1c No results for input(s): HGBA1C in the last 72 hours. Lipid Profile No results for input(s): CHOL, HDL, LDLCALC, TRIG, CHOLHDL, LDLDIRECT in the last 72  hours. Thyroid function studies No results for input(s): TSH, T4TOTAL, T3FREE, THYROIDAB in the last 72 hours.  Invalid input(s): FREET3 Anemia work up No results for input(s): VITAMINB12, FOLATE, FERRITIN, TIBC, IRON, RETICCTPCT in the last 72 hours. Urinalysis    Component Value Date/Time   COLORURINE YELLOW 09/04/2021 1732   APPEARANCEUR CLEAR 09/04/2021 1732   LABSPEC 1.009 09/04/2021 1732   PHURINE 5.0 09/04/2021 1732   GLUCOSEU >=500 (A) 09/04/2021 1732   GLUCOSEU 100 (A) 11/20/2019 1100   HGBUR MODERATE (A) 09/04/2021 1732   BILIRUBINUR NEGATIVE 09/04/2021 1732   KETONESUR NEGATIVE 09/04/2021 1732   PROTEINUR 30 (A) 09/04/2021 1732   UROBILINOGEN 0.2 11/20/2019 1100   NITRITE NEGATIVE 09/04/2021 1732   LEUKOCYTESUR NEGATIVE 09/04/2021 1732   Sepsis Labs Invalid input(s): PROCALCITONIN,  WBC,  LACTICIDVEN Microbiology Recent Results (from the past 240 hour(s))  Urine Culture     Status: None   Collection Time: 09/04/21  5:32 PM   Specimen: Urine, Catheterized  Result Value Ref Range Status   Specimen Description   Final    URINE, CATHETERIZED Performed at [facility], [place] [address], [place], [place] [address]    Special Requests   Final    NONE Performed at [facility], [place] [address] [place], [place] [address]    Culture   Final    NO GROWTH Performed at [facility], [place] [address] [place], [place] [address]    Report Status 09/05/2021 FINAL  Final  Resp Panel by RT-PCR (Flu A&B, Covid) Nasopharyngeal Swab     Status: None   Collection Time: 09/04/21  9:15 PM   Specimen: Nasopharyngeal Swab; Nasopharyngeal(NP) swabs in vial transport medium  Result Value Ref Range Status   SARS Coronavirus 2 by RT PCR NEGATIVE NEGATIVE Final    Comment: (NOTE) SARS-CoV-2 target nucleic acids are NOT DETECTED.  The SARS-CoV-2 RNA is generally detectable in upper respiratory specimens during the acute phase of infection. The  lowest concentration of SARS-CoV-2 viral copies this assay can detect is 138 copies/mL. A negative result does not preclude SARS-Cov-2 infection and should not be used as the sole basis for treatment or other patient management decisions. A negative result may occur with  improper specimen collection/handling, submission of specimen other than nasopharyngeal swab, presence of viral mutation(s) within the areas targeted by this assay, and inadequate number of viral copies(<138 copies/mL). A negative result must be combined with clinical observations, patient history, and epidemiological information. The expected result is Negative.  Fact Sheet for Patients:  EntrepreneurPulse.com.au  Fact Sheet for Healthcare Providers:  IncredibleEmployment.be  This test is no t yet approved or cleared by the [place] FDA and  has been authorized for detection and/or diagnosis of SARS-CoV-2 by FDA under an Emergency Use Authorization (EUA). This EUA will remain  in effect (meaning this test can be used) for the duration of the COVID-19  declaration under Section 564(b)(1) of the Act, 21 U.S.C.section 360bbb-3(b)(1), unless the authorization is terminated  or revoked sooner.       Influenza A by PCR NEGATIVE NEGATIVE Final   Influenza B by PCR NEGATIVE NEGATIVE Final    Comment: (NOTE) The Xpert Xpress SARS-CoV-2/FLU/RSV plus assay is intended as an aid in the diagnosis of influenza from Nasopharyngeal swab specimens and should not be used as a sole basis for treatment. Nasal washings and aspirates are unacceptable for Xpert Xpress SARS-CoV-2/FLU/RSV testing.  Fact Sheet for Patients: EntrepreneurPulse.com.au  Fact Sheet for Healthcare Providers: IncredibleEmployment.be  This test is not yet approved or cleared by the [place] FDA and has been authorized for detection and/or diagnosis of SARS-CoV-2 by FDA under  an Emergency Use Authorization (EUA). This EUA will remain in effect (meaning this test can be used) for the duration of the COVID-19 declaration under Section 564(b)(1) of the Act, 21 U.S.C. section 360bbb-3(b)(1), unless the authorization is terminated or revoked.  Performed at [facility], [place] [address], [place], [place] [address]   Gastrointestinal Panel by PCR , Stool     Status: None   Collection Time: 09/05/21  1:45 PM   Specimen: Stool  Result Value Ref Range Status   Campylobacter species NOT DETECTED NOT DETECTED Final   Plesimonas shigelloides NOT DETECTED NOT DETECTED Final   Salmonella species NOT DETECTED NOT DETECTED Final   Yersinia enterocolitica NOT DETECTED NOT DETECTED Final   Vibrio species NOT DETECTED NOT DETECTED Final   Vibrio cholerae NOT DETECTED NOT DETECTED Final   Enteroaggregative E coli (EAEC) NOT DETECTED NOT DETECTED Final   Enteropathogenic E coli (EPEC) NOT DETECTED NOT DETECTED Final   Enterotoxigenic E coli (ETEC) NOT DETECTED NOT DETECTED Final   Shiga like toxin producing E coli (STEC) NOT DETECTED NOT DETECTED Final   Shigella/Enteroinvasive E coli (EIEC) NOT DETECTED NOT DETECTED Final   Cryptosporidium NOT DETECTED NOT DETECTED Final   Cyclospora cayetanensis NOT DETECTED NOT DETECTED Final   Entamoeba histolytica NOT DETECTED NOT DETECTED Final   Giardia lamblia NOT DETECTED NOT DETECTED Final   Adenovirus F40/41 NOT DETECTED NOT DETECTED Final   Astrovirus NOT DETECTED NOT DETECTED Final   Norovirus GI/GII NOT DETECTED NOT DETECTED Final   Rotavirus A NOT DETECTED NOT DETECTED Final   Sapovirus (I, [name], IV, and V) NOT DETECTED NOT DETECTED Final    Comment: Performed at [facility], [address], [place], [place] [address]     Time coordinating discharge in minutes: 65  SIGNED:   Debbe Odea, MD  [facility] 09/11/2021, 3:07 PM

## 2021-09-11 NOTE — Care Management Important Message (Signed)
Important Message  Patient Details IM Letter given to the Patient. Name: Neil [name] MRN: [id number] Date of Birth: May 04, 1941   Medicare Important Message Given:  Yes     [name] 09/11/2021, 12:11 PM

## 2021-09-11 NOTE — Progress Notes (Signed)
Patient ID: Neil [name], male   DOB: 05-05-1941, 80 y.o.   MRN: [id number]   Brief GI note-  GI asked to comment on medication regimen for homeless patient to be discharged.  Flexible sigmoidoscopy 09/08/2021, Dr. Maryjean Morn involving at least the sigmoid, located in an area of diverticulosis,, consider chronic colitis, IBD versus segmental colitis associated with diverticulosis.  Also had nonbleeding internal hemorrhoids  Fecal calprotectin pending  Surgical path still pending  Last labs 09/09/2021-hemoglobin 12.2/hematocrit 38.2 stable.   Plan; okay to stop Rowasa enemas which patient will not be able to administer himself at home Would wait for surgical path to resolve before starting any other therapy, as he is not having any active bleeding.  Our office will arrange office follow-up with Dr. Tarri Glenn in a couple of weeks.  Office will contact the patient with that appointment.

## 2021-09-11 NOTE — TOC Transition Note (Signed)
Transition of Care [facility]) - CM/SW Discharge Note   Patient Details  Name: Neil [name] MRN: [id number] Date of Birth: 1941/06/22  Transition of Care [facility]) CM/SW Contact:  Dessa Phi, RN Phone Number: 09/11/2021, 3:07 PM   Clinical Narrative: d/c home w/additional HHC-added HHRN/OT/aide [facility] rep Ken[place] aware-patient unable to learn  f/c care. No further CM needs.    Final next level of care: [place] Barriers to Discharge: No Barriers Identified   Patient Goals and CMS Choice Patient states their goals for this hospitalization and ongoing recovery are:: to go home CMS Medicare.gov Compare Post Acute Care list provided to:: Patient    Discharge Placement                       Discharge Plan and Services   Discharge Planning Services: CM Consult                      HH Arranged: RN, OT, Nurse's Aide [place] Agency: [place] ([facility]) Date [facility] Agency Contacted: 09/11/21 Time [place]: 1223 Representative spoke with at [place]: [place] ([place]) Interventions     Readmission Risk Interventions No flowsheet data found.

## 2021-09-11 NOTE — Progress Notes (Signed)
A foley catheter was placed after voiding trial failed and bladder scan measured about 490 mls of urine.

## 2021-09-11 NOTE — TOC Transition Note (Signed)
Transition of Care [facility]) - CM/SW Discharge Note   Patient Details  Name: RADFORD PEASE MRN: [id number] Date of Birth: 1941-02-27  Transition of Care [facility]) CM/SW Contact:  Dessa Phi, RN Phone Number: 09/11/2021, 12:28 PM   Clinical Narrative:PT recc [facility]PT-[facility] rep Ramond Marrow already following for HHPT. TC brother Eduard Clos brother about HHC/& left vm with dtr Darlene for call back.      Final next level of care: [place] Barriers to Discharge: No Barriers Identified   Patient Goals and CMS Choice Patient states their goals for this hospitalization and ongoing recovery are:: to go home CMS Medicare.gov Compare Post Acute Care list provided to:: Patient    Discharge Placement                       Discharge Plan and Services   Discharge Planning Services: CM Consult                      HH Arranged: PT [facility] Agency: [place] ([place]) Date [place]: 09/11/21 Time [place]: 1223 Representative spoke with at [place]: [place] Determinants of Health ([place]) Interventions     Readmission Risk Interventions No flowsheet data found.

## 2021-09-11 NOTE — Progress Notes (Signed)
Patient educated on foley catheter use and maintenance.  Teach-back method utilized.  Patient demonstrated a delayed response and confusion with alternating between leg bag and standard drainage bag.  Patient unable to demonstrate success with emptying, bag change and daily maintenance without reiteration of skill steps and nursing assistance.

## 2021-09-12 LAB — CALPROTECTIN, FECAL: Calprotectin, Fecal: 733 ug/g — ABNORMAL HIGH (ref 0–120)

## 2021-09-13 ENCOUNTER — Telehealth: Payer: Self-pay | Admitting: Internal Medicine

## 2021-09-13 ENCOUNTER — Telehealth: Payer: Self-pay

## 2021-09-13 ENCOUNTER — Other Ambulatory Visit: Payer: Self-pay

## 2021-09-13 DIAGNOSIS — Z9181 History of falling: Secondary | ICD-10-CM | POA: Diagnosis not present

## 2021-09-13 DIAGNOSIS — Z7984 Long term (current) use of oral hypoglycemic drugs: Secondary | ICD-10-CM | POA: Diagnosis not present

## 2021-09-13 DIAGNOSIS — T466X5D Adverse effect of antihyperlipidemic and antiarteriosclerotic drugs, subsequent encounter: Secondary | ICD-10-CM | POA: Diagnosis not present

## 2021-09-13 DIAGNOSIS — N1832 Chronic kidney disease, stage 3b: Secondary | ICD-10-CM | POA: Diagnosis not present

## 2021-09-13 DIAGNOSIS — Z7982 Long term (current) use of aspirin: Secondary | ICD-10-CM | POA: Diagnosis not present

## 2021-09-13 DIAGNOSIS — I2510 Atherosclerotic heart disease of native coronary artery without angina pectoris: Secondary | ICD-10-CM | POA: Diagnosis not present

## 2021-09-13 DIAGNOSIS — K648 Other hemorrhoids: Secondary | ICD-10-CM | POA: Diagnosis not present

## 2021-09-13 DIAGNOSIS — I129 Hypertensive chronic kidney disease with stage 1 through stage 4 chronic kidney disease, or unspecified chronic kidney disease: Secondary | ICD-10-CM | POA: Diagnosis not present

## 2021-09-13 DIAGNOSIS — E079 Disorder of thyroid, unspecified: Secondary | ICD-10-CM | POA: Diagnosis not present

## 2021-09-13 DIAGNOSIS — E43 Unspecified severe protein-calorie malnutrition: Secondary | ICD-10-CM | POA: Diagnosis not present

## 2021-09-13 DIAGNOSIS — K5730 Diverticulosis of large intestine without perforation or abscess without bleeding: Secondary | ICD-10-CM | POA: Diagnosis not present

## 2021-09-13 DIAGNOSIS — S51812D Laceration without foreign body of left forearm, subsequent encounter: Secondary | ICD-10-CM | POA: Diagnosis not present

## 2021-09-13 DIAGNOSIS — E1151 Type 2 diabetes mellitus with diabetic peripheral angiopathy without gangrene: Secondary | ICD-10-CM | POA: Diagnosis not present

## 2021-09-13 DIAGNOSIS — N138 Other obstructive and reflux uropathy: Secondary | ICD-10-CM | POA: Diagnosis not present

## 2021-09-13 DIAGNOSIS — G720 Drug-induced myopathy: Secondary | ICD-10-CM | POA: Diagnosis not present

## 2021-09-13 DIAGNOSIS — K529 Noninfective gastroenteritis and colitis, unspecified: Secondary | ICD-10-CM | POA: Diagnosis not present

## 2021-09-13 DIAGNOSIS — E781 Pure hyperglyceridemia: Secondary | ICD-10-CM | POA: Diagnosis not present

## 2021-09-13 DIAGNOSIS — Z466 Encounter for fitting and adjustment of urinary device: Secondary | ICD-10-CM | POA: Diagnosis not present

## 2021-09-13 DIAGNOSIS — N1330 Unspecified hydronephrosis: Secondary | ICD-10-CM | POA: Diagnosis not present

## 2021-09-13 DIAGNOSIS — N179 Acute kidney failure, unspecified: Secondary | ICD-10-CM | POA: Diagnosis not present

## 2021-09-13 DIAGNOSIS — R338 Other retention of urine: Secondary | ICD-10-CM | POA: Diagnosis not present

## 2021-09-13 DIAGNOSIS — E1122 Type 2 diabetes mellitus with diabetic chronic kidney disease: Secondary | ICD-10-CM | POA: Diagnosis not present

## 2021-09-13 DIAGNOSIS — M719 Bursopathy, unspecified: Secondary | ICD-10-CM | POA: Diagnosis not present

## 2021-09-13 DIAGNOSIS — F32A Depression, unspecified: Secondary | ICD-10-CM | POA: Diagnosis not present

## 2021-09-13 NOTE — Telephone Encounter (Signed)
Pt scheduled to see [name] 10/03/21@2pm , pt mailed appt letter.

## 2021-09-13 NOTE — Telephone Encounter (Signed)
Ok for verbals 

## 2021-09-13 NOTE — Telephone Encounter (Signed)
-----   Message from Aleatha Borer, LPN sent at [id number]/60/[id number]  9:00 AM EST ----- Regarding: FW: Office follow-up Neil [name],  I am sure you have multiple messages in your box regarding this pt. I do not want to duplicate work and confuse this pt. Please let me know if you would like me to call him to schedule an appt with [name].  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 He should followup with [name]! I sent a message last week to schedule hospital followup for this patient, as well. So, there should be a duplicate request circulating.  Please do not schedule the followup with me.  Thank you.  KLB ----- Message ----- From: Alfredia Ferguson, PA-C Sent: 09/11/2021   2:56 PM EST To: Aleatha Borer, LPN, Thornton Park, MD Subject: Office follow-up                               This patient was seen by Dr. Tarri Glenn late last week and had flexible sigmoidoscopy for bleeding.  He has been discharged from the hospital today per the hospitalist. Has not had any bleeding over the past couple of days, hospitalist does not feel that he would be able to administer a Rowasa enema to himself.  Path results are still pending -question name segmental colitis associated with diverticulosis  He needs an office follow-up scheduled with Dr. Tarri Glenn . Ammie -please call him tomorrow with an appointment time etc. thank you

## 2021-09-13 NOTE — Telephone Encounter (Signed)
-----   Message from Aleatha Borer, LPN sent at 35/46/5681  9:00 AM EST ----- Regarding: FW: Office follow-up Neil [name],  I am sure you have multiple messages in your box regarding this pt. I do not want to duplicate work and confuse this pt. Please let me know if you would like me to call him to schedule an appt with [name].  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 He should followup with [name]! I sent a message last week to schedule hospital followup for this patient, as well. So, there should be a duplicate request circulating.  Please do not schedule the followup with me.  Thank you.  KLB ----- Message ----- From: Alfredia Ferguson, PA-C Sent: 09/11/2021   2:56 PM EST To: Aleatha Borer, LPN, Thornton Park, MD Subject: Office follow-up                               This patient was seen by Dr. Tarri Glenn late last week and had flexible sigmoidoscopy for bleeding.  He has been discharged from the hospital today per the hospitalist. Has not had any bleeding over the past couple of days, hospitalist does not feel that he would be able to administer a Rowasa enema to himself.  Path results are still pending -question name segmental colitis associated with diverticulosis  He needs an office follow-up scheduled with Dr. Tarri Glenn . Ammie -please call him tomorrow with an appointment time etc. thank you

## 2021-09-13 NOTE — Telephone Encounter (Signed)
[place] Name: Neil [name] Name: [place] Phone #: [phone] Service Requested: PT 1 week 1 2 week 1 1 week [place] 1 week 1 2 week 4  Skilled nursing evaluation & medical social worker evaluation

## 2021-09-13 NOTE — Telephone Encounter (Signed)
Transition Care Management Follow-up Telephone Call Date of discharge and from where: [place] 09/11/21 How have you been since you were released from the hospital? Not good, still have a catheter in  Any questions or concerns? No  Items Reviewed: Did the pt receive and understand the discharge instructions provided? Yes  Medications obtained and verified? Yes  Other? No  Any new allergies since your discharge? No  Dietary orders reviewed? Yes Do you have support at home? Yes   Home Care and Equipment/Supplies: Were home health services ordered? yes If so, what is the [facility] of the agency[facility]   Has the agency set up a time to come to the patient's home? Yes 09/13/21 at 2-230 Were any new equipment or medical supplies ordered?  No What is the name of the medical supply agency?  Were you able to get the supplies/equipment? not applicable Do you have any questions related to the use of the equipment or supplies? No  Functional Questionnaire: (I = Independent and D = Dependent) ADLs: I  Bathing/Dressing- I  Meal Prep- I  Eating- I  Maintaining continence- I PT HAS A CATHETER  Transferring/Ambulation- I  Managing Meds- I  Follow up appointments reviewed:  PCP Hospital f/u appt confirmed? No   Specialist Hospital f/u appt confirmed? Yes  Scheduled to see PT STATED 09/26/21 Are transportation arrangements needed? No  If their condition worsens, is the pt aware to call PCP or go to the Emergency Dept.? Yes Was the patient provided with contact information for the PCP's office or ED? Yes Was to pt encouraged to call back with questions or concerns? Yes

## 2021-09-14 NOTE — Telephone Encounter (Signed)
Verbals left on confidential voicemail of [place] with [facility]

## 2021-09-15 DIAGNOSIS — Z9181 History of falling: Secondary | ICD-10-CM | POA: Diagnosis not present

## 2021-09-15 DIAGNOSIS — N1832 Chronic kidney disease, stage 3b: Secondary | ICD-10-CM | POA: Diagnosis not present

## 2021-09-15 DIAGNOSIS — K529 Noninfective gastroenteritis and colitis, unspecified: Secondary | ICD-10-CM | POA: Diagnosis not present

## 2021-09-15 DIAGNOSIS — K648 Other hemorrhoids: Secondary | ICD-10-CM | POA: Diagnosis not present

## 2021-09-15 DIAGNOSIS — E1151 Type 2 diabetes mellitus with diabetic peripheral angiopathy without gangrene: Secondary | ICD-10-CM | POA: Diagnosis not present

## 2021-09-15 DIAGNOSIS — Z7982 Long term (current) use of aspirin: Secondary | ICD-10-CM | POA: Diagnosis not present

## 2021-09-15 DIAGNOSIS — N1330 Unspecified hydronephrosis: Secondary | ICD-10-CM | POA: Diagnosis not present

## 2021-09-15 DIAGNOSIS — Z7984 Long term (current) use of oral hypoglycemic drugs: Secondary | ICD-10-CM | POA: Diagnosis not present

## 2021-09-15 DIAGNOSIS — T466X5D Adverse effect of antihyperlipidemic and antiarteriosclerotic drugs, subsequent encounter: Secondary | ICD-10-CM | POA: Diagnosis not present

## 2021-09-15 DIAGNOSIS — S51812D Laceration without foreign body of left forearm, subsequent encounter: Secondary | ICD-10-CM | POA: Diagnosis not present

## 2021-09-15 DIAGNOSIS — K5730 Diverticulosis of large intestine without perforation or abscess without bleeding: Secondary | ICD-10-CM | POA: Diagnosis not present

## 2021-09-15 DIAGNOSIS — I2510 Atherosclerotic heart disease of native coronary artery without angina pectoris: Secondary | ICD-10-CM | POA: Diagnosis not present

## 2021-09-15 DIAGNOSIS — E079 Disorder of thyroid, unspecified: Secondary | ICD-10-CM | POA: Diagnosis not present

## 2021-09-15 DIAGNOSIS — E43 Unspecified severe protein-calorie malnutrition: Secondary | ICD-10-CM | POA: Diagnosis not present

## 2021-09-15 DIAGNOSIS — E1122 Type 2 diabetes mellitus with diabetic chronic kidney disease: Secondary | ICD-10-CM | POA: Diagnosis not present

## 2021-09-15 DIAGNOSIS — I129 Hypertensive chronic kidney disease with stage 1 through stage 4 chronic kidney disease, or unspecified chronic kidney disease: Secondary | ICD-10-CM | POA: Diagnosis not present

## 2021-09-15 DIAGNOSIS — F32A Depression, unspecified: Secondary | ICD-10-CM | POA: Diagnosis not present

## 2021-09-15 DIAGNOSIS — M719 Bursopathy, unspecified: Secondary | ICD-10-CM | POA: Diagnosis not present

## 2021-09-15 DIAGNOSIS — Z466 Encounter for fitting and adjustment of urinary device: Secondary | ICD-10-CM | POA: Diagnosis not present

## 2021-09-15 DIAGNOSIS — R338 Other retention of urine: Secondary | ICD-10-CM | POA: Diagnosis not present

## 2021-09-15 DIAGNOSIS — G720 Drug-induced myopathy: Secondary | ICD-10-CM | POA: Diagnosis not present

## 2021-09-15 DIAGNOSIS — E781 Pure hyperglyceridemia: Secondary | ICD-10-CM | POA: Diagnosis not present

## 2021-09-15 DIAGNOSIS — N138 Other obstructive and reflux uropathy: Secondary | ICD-10-CM | POA: Diagnosis not present

## 2021-09-15 DIAGNOSIS — N179 Acute kidney failure, unspecified: Secondary | ICD-10-CM | POA: Diagnosis not present

## 2021-09-16 DIAGNOSIS — I129 Hypertensive chronic kidney disease with stage 1 through stage 4 chronic kidney disease, or unspecified chronic kidney disease: Secondary | ICD-10-CM | POA: Diagnosis not present

## 2021-09-16 DIAGNOSIS — E1151 Type 2 diabetes mellitus with diabetic peripheral angiopathy without gangrene: Secondary | ICD-10-CM | POA: Diagnosis not present

## 2021-09-16 DIAGNOSIS — E079 Disorder of thyroid, unspecified: Secondary | ICD-10-CM | POA: Diagnosis not present

## 2021-09-16 DIAGNOSIS — Z7984 Long term (current) use of oral hypoglycemic drugs: Secondary | ICD-10-CM | POA: Diagnosis not present

## 2021-09-16 DIAGNOSIS — M719 Bursopathy, unspecified: Secondary | ICD-10-CM | POA: Diagnosis not present

## 2021-09-16 DIAGNOSIS — N1832 Chronic kidney disease, stage 3b: Secondary | ICD-10-CM | POA: Diagnosis not present

## 2021-09-16 DIAGNOSIS — Z9181 History of falling: Secondary | ICD-10-CM | POA: Diagnosis not present

## 2021-09-16 DIAGNOSIS — R338 Other retention of urine: Secondary | ICD-10-CM | POA: Diagnosis not present

## 2021-09-16 DIAGNOSIS — N1330 Unspecified hydronephrosis: Secondary | ICD-10-CM | POA: Diagnosis not present

## 2021-09-16 DIAGNOSIS — K5730 Diverticulosis of large intestine without perforation or abscess without bleeding: Secondary | ICD-10-CM | POA: Diagnosis not present

## 2021-09-16 DIAGNOSIS — K648 Other hemorrhoids: Secondary | ICD-10-CM | POA: Diagnosis not present

## 2021-09-16 DIAGNOSIS — S51812D Laceration without foreign body of left forearm, subsequent encounter: Secondary | ICD-10-CM | POA: Diagnosis not present

## 2021-09-16 DIAGNOSIS — N179 Acute kidney failure, unspecified: Secondary | ICD-10-CM | POA: Diagnosis not present

## 2021-09-16 DIAGNOSIS — E781 Pure hyperglyceridemia: Secondary | ICD-10-CM | POA: Diagnosis not present

## 2021-09-16 DIAGNOSIS — I2510 Atherosclerotic heart disease of native coronary artery without angina pectoris: Secondary | ICD-10-CM | POA: Diagnosis not present

## 2021-09-16 DIAGNOSIS — N138 Other obstructive and reflux uropathy: Secondary | ICD-10-CM | POA: Diagnosis not present

## 2021-09-16 DIAGNOSIS — E43 Unspecified severe protein-calorie malnutrition: Secondary | ICD-10-CM | POA: Diagnosis not present

## 2021-09-16 DIAGNOSIS — F32A Depression, unspecified: Secondary | ICD-10-CM | POA: Diagnosis not present

## 2021-09-16 DIAGNOSIS — Z466 Encounter for fitting and adjustment of urinary device: Secondary | ICD-10-CM | POA: Diagnosis not present

## 2021-09-16 DIAGNOSIS — K529 Noninfective gastroenteritis and colitis, unspecified: Secondary | ICD-10-CM | POA: Diagnosis not present

## 2021-09-16 DIAGNOSIS — G720 Drug-induced myopathy: Secondary | ICD-10-CM | POA: Diagnosis not present

## 2021-09-16 DIAGNOSIS — Z7982 Long term (current) use of aspirin: Secondary | ICD-10-CM | POA: Diagnosis not present

## 2021-09-16 DIAGNOSIS — E1122 Type 2 diabetes mellitus with diabetic chronic kidney disease: Secondary | ICD-10-CM | POA: Diagnosis not present

## 2021-09-16 DIAGNOSIS — T466X5D Adverse effect of antihyperlipidemic and antiarteriosclerotic drugs, subsequent encounter: Secondary | ICD-10-CM | POA: Diagnosis not present

## 2021-09-18 DIAGNOSIS — N1832 Chronic kidney disease, stage 3b: Secondary | ICD-10-CM | POA: Diagnosis not present

## 2021-09-18 DIAGNOSIS — M719 Bursopathy, unspecified: Secondary | ICD-10-CM | POA: Diagnosis not present

## 2021-09-18 DIAGNOSIS — S51812D Laceration without foreign body of left forearm, subsequent encounter: Secondary | ICD-10-CM | POA: Diagnosis not present

## 2021-09-18 DIAGNOSIS — N138 Other obstructive and reflux uropathy: Secondary | ICD-10-CM | POA: Diagnosis not present

## 2021-09-18 DIAGNOSIS — R338 Other retention of urine: Secondary | ICD-10-CM | POA: Diagnosis not present

## 2021-09-18 DIAGNOSIS — K5730 Diverticulosis of large intestine without perforation or abscess without bleeding: Secondary | ICD-10-CM | POA: Diagnosis not present

## 2021-09-18 DIAGNOSIS — T466X5D Adverse effect of antihyperlipidemic and antiarteriosclerotic drugs, subsequent encounter: Secondary | ICD-10-CM | POA: Diagnosis not present

## 2021-09-18 DIAGNOSIS — E079 Disorder of thyroid, unspecified: Secondary | ICD-10-CM | POA: Diagnosis not present

## 2021-09-18 DIAGNOSIS — N1330 Unspecified hydronephrosis: Secondary | ICD-10-CM | POA: Diagnosis not present

## 2021-09-18 DIAGNOSIS — Z7984 Long term (current) use of oral hypoglycemic drugs: Secondary | ICD-10-CM | POA: Diagnosis not present

## 2021-09-18 DIAGNOSIS — F32A Depression, unspecified: Secondary | ICD-10-CM | POA: Diagnosis not present

## 2021-09-18 DIAGNOSIS — I2510 Atherosclerotic heart disease of native coronary artery without angina pectoris: Secondary | ICD-10-CM | POA: Diagnosis not present

## 2021-09-18 DIAGNOSIS — Z9181 History of falling: Secondary | ICD-10-CM | POA: Diagnosis not present

## 2021-09-18 DIAGNOSIS — E781 Pure hyperglyceridemia: Secondary | ICD-10-CM | POA: Diagnosis not present

## 2021-09-18 DIAGNOSIS — Z466 Encounter for fitting and adjustment of urinary device: Secondary | ICD-10-CM | POA: Diagnosis not present

## 2021-09-18 DIAGNOSIS — Z7982 Long term (current) use of aspirin: Secondary | ICD-10-CM | POA: Diagnosis not present

## 2021-09-18 DIAGNOSIS — E1122 Type 2 diabetes mellitus with diabetic chronic kidney disease: Secondary | ICD-10-CM | POA: Diagnosis not present

## 2021-09-18 DIAGNOSIS — G720 Drug-induced myopathy: Secondary | ICD-10-CM | POA: Diagnosis not present

## 2021-09-18 DIAGNOSIS — N179 Acute kidney failure, unspecified: Secondary | ICD-10-CM | POA: Diagnosis not present

## 2021-09-18 DIAGNOSIS — E1151 Type 2 diabetes mellitus with diabetic peripheral angiopathy without gangrene: Secondary | ICD-10-CM | POA: Diagnosis not present

## 2021-09-18 DIAGNOSIS — I129 Hypertensive chronic kidney disease with stage 1 through stage 4 chronic kidney disease, or unspecified chronic kidney disease: Secondary | ICD-10-CM | POA: Diagnosis not present

## 2021-09-18 DIAGNOSIS — K529 Noninfective gastroenteritis and colitis, unspecified: Secondary | ICD-10-CM | POA: Diagnosis not present

## 2021-09-18 DIAGNOSIS — E43 Unspecified severe protein-calorie malnutrition: Secondary | ICD-10-CM | POA: Diagnosis not present

## 2021-09-18 DIAGNOSIS — K648 Other hemorrhoids: Secondary | ICD-10-CM | POA: Diagnosis not present

## 2021-09-20 DIAGNOSIS — Z466 Encounter for fitting and adjustment of urinary device: Secondary | ICD-10-CM | POA: Diagnosis not present

## 2021-09-20 DIAGNOSIS — I129 Hypertensive chronic kidney disease with stage 1 through stage 4 chronic kidney disease, or unspecified chronic kidney disease: Secondary | ICD-10-CM | POA: Diagnosis not present

## 2021-09-20 DIAGNOSIS — K529 Noninfective gastroenteritis and colitis, unspecified: Secondary | ICD-10-CM | POA: Diagnosis not present

## 2021-09-20 DIAGNOSIS — N1330 Unspecified hydronephrosis: Secondary | ICD-10-CM | POA: Diagnosis not present

## 2021-09-20 DIAGNOSIS — E781 Pure hyperglyceridemia: Secondary | ICD-10-CM | POA: Diagnosis not present

## 2021-09-20 DIAGNOSIS — G720 Drug-induced myopathy: Secondary | ICD-10-CM | POA: Diagnosis not present

## 2021-09-20 DIAGNOSIS — E43 Unspecified severe protein-calorie malnutrition: Secondary | ICD-10-CM | POA: Diagnosis not present

## 2021-09-20 DIAGNOSIS — E1151 Type 2 diabetes mellitus with diabetic peripheral angiopathy without gangrene: Secondary | ICD-10-CM | POA: Diagnosis not present

## 2021-09-20 DIAGNOSIS — Z9181 History of falling: Secondary | ICD-10-CM | POA: Diagnosis not present

## 2021-09-20 DIAGNOSIS — I2510 Atherosclerotic heart disease of native coronary artery without angina pectoris: Secondary | ICD-10-CM | POA: Diagnosis not present

## 2021-09-20 DIAGNOSIS — F32A Depression, unspecified: Secondary | ICD-10-CM | POA: Diagnosis not present

## 2021-09-20 DIAGNOSIS — S51812D Laceration without foreign body of left forearm, subsequent encounter: Secondary | ICD-10-CM | POA: Diagnosis not present

## 2021-09-20 DIAGNOSIS — R338 Other retention of urine: Secondary | ICD-10-CM | POA: Diagnosis not present

## 2021-09-20 DIAGNOSIS — Z7982 Long term (current) use of aspirin: Secondary | ICD-10-CM | POA: Diagnosis not present

## 2021-09-20 DIAGNOSIS — M719 Bursopathy, unspecified: Secondary | ICD-10-CM | POA: Diagnosis not present

## 2021-09-20 DIAGNOSIS — T466X5D Adverse effect of antihyperlipidemic and antiarteriosclerotic drugs, subsequent encounter: Secondary | ICD-10-CM | POA: Diagnosis not present

## 2021-09-20 DIAGNOSIS — N1832 Chronic kidney disease, stage 3b: Secondary | ICD-10-CM | POA: Diagnosis not present

## 2021-09-20 DIAGNOSIS — E1122 Type 2 diabetes mellitus with diabetic chronic kidney disease: Secondary | ICD-10-CM | POA: Diagnosis not present

## 2021-09-20 DIAGNOSIS — K648 Other hemorrhoids: Secondary | ICD-10-CM | POA: Diagnosis not present

## 2021-09-20 DIAGNOSIS — Z7984 Long term (current) use of oral hypoglycemic drugs: Secondary | ICD-10-CM | POA: Diagnosis not present

## 2021-09-20 DIAGNOSIS — E079 Disorder of thyroid, unspecified: Secondary | ICD-10-CM | POA: Diagnosis not present

## 2021-09-20 DIAGNOSIS — N179 Acute kidney failure, unspecified: Secondary | ICD-10-CM | POA: Diagnosis not present

## 2021-09-20 DIAGNOSIS — K5730 Diverticulosis of large intestine without perforation or abscess without bleeding: Secondary | ICD-10-CM | POA: Diagnosis not present

## 2021-09-20 DIAGNOSIS — N138 Other obstructive and reflux uropathy: Secondary | ICD-10-CM | POA: Diagnosis not present

## 2021-09-21 ENCOUNTER — Ambulatory Visit (INDEPENDENT_AMBULATORY_CARE_PROVIDER_SITE_OTHER): Payer: Medicare Other | Admitting: Internal Medicine

## 2021-09-21 ENCOUNTER — Encounter: Payer: Self-pay | Admitting: Internal Medicine

## 2021-09-21 ENCOUNTER — Other Ambulatory Visit: Payer: Self-pay

## 2021-09-21 VITALS — BP 112/68 | HR 79 | Temp 98.6°F | Ht 68.0 in | Wt 151.0 lb

## 2021-09-21 DIAGNOSIS — I10 Essential (primary) hypertension: Secondary | ICD-10-CM | POA: Diagnosis not present

## 2021-09-21 DIAGNOSIS — R338 Other retention of urine: Secondary | ICD-10-CM

## 2021-09-21 DIAGNOSIS — E1165 Type 2 diabetes mellitus with hyperglycemia: Secondary | ICD-10-CM | POA: Diagnosis not present

## 2021-09-21 DIAGNOSIS — F5101 Primary insomnia: Secondary | ICD-10-CM | POA: Diagnosis not present

## 2021-09-21 DIAGNOSIS — K922 Gastrointestinal hemorrhage, unspecified: Secondary | ICD-10-CM | POA: Diagnosis not present

## 2021-09-21 DIAGNOSIS — N1831 Chronic kidney disease, stage 3a: Secondary | ICD-10-CM

## 2021-09-21 DIAGNOSIS — K529 Noninfective gastroenteritis and colitis, unspecified: Secondary | ICD-10-CM

## 2021-09-21 LAB — BASIC METABOLIC PANEL
BUN: 16 mg/dL (ref 6–23)
CO2: 32 mEq/L (ref 19–32)
Calcium: 9.6 mg/dL (ref 8.4–10.5)
Chloride: 100 mEq/L (ref 96–112)
Creatinine, Ser: 1.11 mg/dL (ref 0.40–1.50)
GFR: 63.21 mL/min (ref >60.00)
Glucose, Bld: 204 mg/dL — ABNORMAL HIGH (ref 70–99)
Potassium: 3.8 mEq/L (ref 3.5–5.1)
Sodium: 139 mEq/L (ref 135–145)

## 2021-09-21 LAB — CBC WITH DIFFERENTIAL/PLATELET
Basophils Absolute: 0 10*3/uL (ref 0.0–0.1)
Basophils Relative: 0.6 % (ref 0.0–3.0)
Eosinophils Absolute: 0.1 10*3/uL (ref 0.0–0.7)
Eosinophils Relative: 1.1 % (ref 0.0–5.0)
HCT: 37.7 % — ABNORMAL LOW (ref 39.0–52.0)
Hemoglobin: 12.2 g/dL — ABNORMAL LOW (ref 13.0–17.0)
Lymphocytes Relative: 13 % (ref 12.0–46.0)
Lymphs Abs: 0.9 10*3/uL (ref 0.7–4.0)
MCHC: 32.3 g/dL (ref 30.0–36.0)
MCV: 85.8 fl (ref 78.0–100.0)
Monocytes Absolute: 0.8 10*3/uL (ref 0.1–1.0)
Monocytes Relative: 11.6 % (ref 3.0–12.0)
Neutro Abs: 5.1 10*3/uL (ref 1.4–7.7)
Neutrophils Relative %: 73.7 % (ref 43.0–77.0)
Platelets: 278 10*3/uL (ref 150.0–400.0)
RBC: 4.39 Mil/uL (ref 4.22–5.81)
RDW: 14.1 % (ref 11.5–15.5)
WBC: 7 10*3/uL (ref 4.0–10.5)

## 2021-09-21 LAB — HEPATIC FUNCTION PANEL
ALT: 12 U/L (ref 0–53)
AST: 11 U/L (ref 0–37)
Albumin: 3.5 g/dL (ref 3.5–5.2)
Alkaline Phosphatase: 30 U/L — ABNORMAL LOW (ref 39–117)
Bilirubin, Direct: 0.1 mg/dL (ref 0.0–0.3)
Total Bilirubin: 0.5 mg/dL (ref 0.2–1.2)
Total Protein: 6.9 g/dL (ref 6.0–8.3)

## 2021-09-21 LAB — HEMOGLOBIN A1C: Hgb A1c MFr Bld: 9.4 % — ABNORMAL HIGH (ref 4.6–6.5)

## 2021-09-21 MED ORDER — TERAZOSIN HCL 2 MG PO CAPS: 2.0000 mg | ORAL_CAPSULE | Freq: Every day | ORAL | 3 refills | Status: DC

## 2021-09-21 MED ORDER — TRAZODONE HCL 50 MG PO TABS: 25.0000 mg | ORAL_TABLET | Freq: Every evening | ORAL | 1 refills | Status: DC | PRN

## 2021-09-21 MED ORDER — BENAZEPRIL HCL 40 MG PO TABS: 20.0000 mg | ORAL_TABLET | Freq: Every day | ORAL | 3 refills | Status: DC

## 2021-09-21 MED ORDER — FINASTERIDE 5 MG PO TABS: 5.0000 mg | ORAL_TABLET | Freq: Every day | ORAL | 3 refills | Status: DC

## 2021-09-21 NOTE — Progress Notes (Signed)
Patient ID: Neil [name], male   DOB: Apr 27, 1941, 80 y.o.   MRN: [id number]        Chief Complaint: follow up recent hospn 11/21 - 11/28 with male family member       HPI:  Neil [name] is a 80 y.o. male here with above with AKI, urinary retention, colitis, GI bleed, today also to f/u lower BP, DM, and sleeping difficultt.  Originally presented to ED with weakness and confusion, then developed retention and dribbling issue, then diarrhea with ED eval and CT c/w BPH but also sigmoid colitis without leukocytosis; foley placed to relieve obstruction and C subsequently improved from presenting bun 58,cr 2.44.[name]  Proscar started, but pt required d/c with foley after failed voiding trial.  GI consulted, started rowasa enema, cipro and flagyl with improved symptoms, GI panel neg.  Delerium resolved.  K replaced.  Metformin held during hospn.  Pt able for d/c, has appt with urology dec 13.  Needs GI referral as has not been notified by phone post d/c yet regarding GI f/u appt with Neil [name].  BP remains low normal and pt c/o generalized weakness.  Still holding metformin.  Has worsening sleep since d/c home, just cant get to sleep most nights.   Pt denies fever, night sweats, loss of appetite, or other constitutional symptoms  Denies urinary symptoms such as dysuria, frequency, urgency, flank pain, hematuria or n/v, fever, chills.  Denies worsening reflux, abd pain, dysphagia, n/v, bowel change or blood. No new complalints  Pt denies chest pain, increased sob or doe, wheezing, orthopnea, PND, increased LE swelling, palpitations, dizziness or syncope.        Transitional Care Management elements noted today: 1)  Date of D/C: as above 2)  Medication reconciliation:  done today at end visit 3)  Review of D/C summary or other information:  done today 4)  Review of need for f/u on pending diagnostic tests and treatments:  done today 5)  Review of need for Interaction with other providers who will assume or  resume care of pt specific problems: done today 6)  Education of patient/family/guardian or caregiver: done today  Wt Readings from Last 3 Encounters:  09/21/21 151 lb (68.5 kg)  09/05/21 156 lb 15.5 oz (71.2 kg)  07/06/21 173 lb (78.5 kg)   BP Readings from Last 3 Encounters:  09/21/21 112/68  09/11/21 (!) 150/70  07/06/21 140/67         Past Medical History:  Diagnosis Date   ALLERGIC RHINITIS 12/04/2007   Arthritis    bilateral hands, shoulders, and knees   BENIGN PROSTATIC HYPERTROPHY 06/05/2007   BUNDLE BRANCH BLOCK, RIGHT 07/21/2009   BURSITIS, RIGHT SHOULDER 11/06/2010   Choledocholithiasis    CKD (chronic kidney disease) stage 3, GFR 30-59 ml/min (HCC) 08/02/2017   COLONIC POLYPS, HX OF 06/05/2007   tubular adenoma also 02/19/2013   Coronary artery calcification seen on CT scan 03/07/2016   DIABETES MELLITUS, TYPE [name] 12/04/2007   pt states not diabetic-no meds 02-05-13 PV   DIVERTICULOSIS, COLON 07/21/2009   ERECTILE DYSFUNCTION 06/05/2007   Gallstones    HYPERLIPIDEMIA 06/05/2007   on meds   HYPERTENSION 06/05/2007   on meds   Hypotension    INGUINAL HERNIA, RIGHT, SMALL 11/06/2010   Liver abscess 05/23/2015   SKIN LESION 06/03/2008   Past Surgical History:  Procedure Laterality Date   BIOPSY  09/08/2021   Procedure: BIOPSY;  Surgeon: Thornton Park, MD;  Location: [facility];  Service: Endoscopy;;   CHOLECYSTECTOMY N/A 03/02/2015   Procedure: LAPAROSCOPIC CHOLECYSTECTOMY;  Surgeon: Fanny Skates, MD;  Location: [facility];  Service: General;  Laterality: N/A;   COLONOSCOPY  2017   JP-MAC-suprep (good)-TA   ERCP N/A 03/01/2015   Procedure: ENDOSCOPIC RETROGRADE CHOLANGIOPANCREATOGRAPHY (ERCP);  Surgeon: Irene Shipper, MD;  Location: [facility];  Service: Endoscopy;  Laterality: N/A;  Neil Dalbert Batman wants patient to stay overnight after ERCP   ERCP N/A 05/03/2015   Procedure: ENDOSCOPIC RETROGRADE CHOLANGIOPANCREATOGRAPHY (ERCP);  Surgeon: Irene Shipper, MD;   Location: [facility];  Service: Endoscopy;  Laterality: N/A;   FINGER SURGERY Left    "little finger"   FLEXIBLE SIGMOIDOSCOPY N/A 09/08/2021   Procedure: FLEXIBLE SIGMOIDOSCOPY;  Surgeon: Thornton Park, MD;  Location: [facility];  Service: Endoscopy;  Laterality: N/A;   [place]   INGUINAL HERNIA REPAIR Right 2014   8'14 repair   POLYPECTOMY     TONSILLECTOMY     WISDOM TOOTH EXTRACTION  1972    reports that he quit smoking about 14 years ago. His smoking use included cigarettes. He has never used smokeless tobacco. He reports that he does not drink alcohol and does not use drugs. family history includes Brain cancer (age of onset: 44) in his mother; Colon cancer in his paternal uncle; Colon cancer (age of onset: 39) in his mother; Colon polyps in his paternal uncle; Colon polyps (age of onset: 71) in his mother. Allergies  Allergen Reactions   Statins Other (See Comments)     muscle aches   Zanaflex [Tizanidine] Other (See Comments)    Dizzy, sleepy   Zetia [Ezetimibe] Other (See Comments)    Statins= Muscle and bone pain   Current Outpatient Medications on File Prior to Visit  Medication Sig Dispense Refill   acetaminophen (TYLENOL) 325 MG tablet Take 2 tablets (650 mg total) by mouth every 6 (six) hours as needed for mild pain (or Fever >/= 101).     acetaminophen (TYLENOL) 500 MG tablet Take 1,500 mg by mouth every 6 (six) hours as needed for mild pain.     Alpha-D-Galactosidase (BEANO PO) Take 1 tablet by mouth daily as needed (flatulence).     aspirin 81 MG EC tablet Take 81 mg by mouth daily.     cholecalciferol (VITAMIN D) 1000 units tablet Take 1,000 Units by mouth 2 (two) times daily.     citalopram (CELEXA) 10 MG tablet TAKE 1 TABLET BY MOUTH  DAILY (Patient taking differently: Take 10 mg by mouth daily.) 90 tablet 3   Cyanocobalamin (B-12) 1000 MCG TABS Take 1,000 mcg by mouth daily.     cyclobenzaprine (FLEXERIL) 5 MG tablet TAKE 1 TABLET BY MOUTH  3  TIMES DAILY AS NEEDED FOR  MUSCLE SPASM(S) (Patient taking differently: Take 5 mg by mouth 3 (three) times daily as needed for muscle spasms.) 270 tablet 1   fenofibrate 160 MG tablet TAKE 1 TABLET BY MOUTH AT  BEDTIME (Patient taking differently: Take 160 mg by mouth at bedtime.) 90 tablet 0   metoprolol succinate (TOPROL-XL) 50 MG 24 hr tablet Take 1 tablet (50 mg total) by mouth daily. Take with or immediately following a meal. 90 tablet 3   Multiple Vitamin (MULTIVITAMIN WITH MINERALS) TABS tablet Take 1 tablet by mouth daily. 30 tablet 0   sodium chloride (OCEAN) 0.65 % nasal spray Place 1 spray into the nose daily as needed for congestion.      No current facility-administered medications on  file prior to visit.        ROS:  All others reviewed and negative.  Objective        PE:  BP 112/68 (BP Location: Right Arm, Patient Position: Sitting, Cuff Size: Normal)   Pulse 79   Temp 98.6 F (37 C) (Oral)   Ht _0  (1.727 m)   Wt 151 lb (68.5 kg)   SpO2 96%   BMI 22.96 kg/m                 Constitutional: Pt appears in NAD               HENT: Head: NCAT.                Right Ear: External ear normal.                 Left Ear: External ear normal.                Eyes: . Pupils are equal, round, and reactive to light. Conjunctivae and EOM are normal               Nose: without d/c or deformity               Neck: Neck supple. Gross normal ROM               Cardiovascular: Normal rate and regular rhythm.                 Pulmonary/Chest: Effort normal and breath sounds without rales or wheezing.                Abd:  Soft, NT, ND, + BS, no organomegaly               Neurological: Pt is alert. At baseline orientation, motor grossly intact               Skin: Skin is warm. No rashes, no other new lesions, LE edema - none               Psychiatric: Pt behavior is normal without agitation   Micro: none  Cardiac tracings I have personally interpreted today:  none  Pertinent  Radiological findings (summarize): none   Lab Results  Component Value Date   WBC 7.0 09/21/2021   HGB 12.2 (L) 09/21/2021   HCT 37.7 (L) 09/21/2021   PLT 278.0 09/21/2021   GLUCOSE 204 (H) 09/21/2021   CHOL 180 05/18/2021   TRIG 207.0 (H) 05/18/2021   HDL 33.70 (L) 05/18/2021   LDLDIRECT 106.0 05/18/2021   LDLCALC 109 (H) 05/19/2020   ALT 12 09/21/2021   AST 11 09/21/2021   NA 139 09/21/2021   K 3.8 09/21/2021   CL 100 09/21/2021   CREATININE 1.11 09/21/2021   BUN 16 09/21/2021   CO2 32 09/21/2021   TSH 0.160 (L) 09/05/2021   PSA 2.16 05/18/2021   INR 1.25 05/23/2015   HGBA1C 9.4 (H) 09/21/2021   MICROALBUR 5.6 (H) 07/08/2015   Assessment/Plan:  TATSUYA OKRAY [name] is a 80 y.o. White or Caucasian [1] male with  has a past medical history of ALLERGIC RHINITIS (12/04/2007), Arthritis, BENIGN PROSTATIC HYPERTROPHY (06/05/2007), BUNDLE BRANCH BLOCK, RIGHT (07/21/2009), BURSITIS, RIGHT SHOULDER (11/06/2010), Choledocholithiasis, CKD (chronic kidney disease) stage 3, GFR 30-59 ml/min (HCC) (08/02/2017), COLONIC POLYPS, HX OF (06/05/2007), Coronary artery calcification seen on CT scan (03/07/2016), DIABETES MELLITUS, TYPE [name] (12/04/2007), DIVERTICULOSIS, COLON (07/21/2009), ERECTILE DYSFUNCTION (06/05/2007), Gallstones, HYPERLIPIDEMIA (  06/05/2007), HYPERTENSION (06/05/2007), Hypotension, INGUINAL HERNIA, RIGHT, SMALL (11/06/2010), Liver abscess (05/23/2015), and SKIN LESION (06/03/2008).  Diabetes ([place]) Stable overall, to continue to hold metformin  CKD (chronic kidney disease) stage 3, GFR 30-59 ml/min (HCC) With recent AKi on CKD - for f/u lab  Essential hypertension, benign With BP low normal and general weakness - for decreased lotension to 20 mg qd  Colitis Symptomatically resolved,  to f/u any worsening symptoms or concerns  GI bleed Also for referral GI to f/u  Acute urinary retention Foley in place, pt to f/u with urology as planned dec 13  Insomnia Also for  trazodone qhs prn,  to f/u any worsening symptoms or concerns  Followup: Return in about 3 months (around 12/20/2021).  Neil Cower, MD 09/26/2021 8:42 PM [facility]

## 2021-09-21 NOTE — Patient Instructions (Signed)
Due to the weight loss - ok to take HALF of the benazepril (so you would take 20 mg only)  Ok to [facility] on taking any metformin for now  Please take all new medication as prescribed - the trazodone for sleep  Ok to try the OTC Immodium (or keopectate) for diarrhea for now  You will be contacted regarding the referral for: Dr Tarri Glenn (GI) - hopefully soon  Please continue all other medications as before, and refills have been done if requested.  Please have the pharmacy call with any other refills you may need.  Please keep your appointments with your specialists as you may have planned - urology on Dec 13  Please go to the LAB at the blood drawing area for the tests to be done  You will be contacted by phone if any changes need to be made immediately.  Otherwise, you will receive a letter about your results with an explanation, but please check with MyChart first.  Please remember to sign up for MyChart if you have not done so, as this will be important to you in the future with finding out test results, communicating by private email, and scheduling acute appointments online when needed.  Please make an Appointment to return in 3 months, or sooner if needed

## 2021-09-22 DIAGNOSIS — K6389 Other specified diseases of intestine: Secondary | ICD-10-CM | POA: Diagnosis not present

## 2021-09-22 DIAGNOSIS — E119 Type 2 diabetes mellitus without complications: Secondary | ICD-10-CM | POA: Diagnosis not present

## 2021-09-22 DIAGNOSIS — K922 Gastrointestinal hemorrhage, unspecified: Secondary | ICD-10-CM | POA: Diagnosis not present

## 2021-09-22 DIAGNOSIS — J309 Allergic rhinitis, unspecified: Secondary | ICD-10-CM | POA: Diagnosis not present

## 2021-09-22 DIAGNOSIS — I2510 Atherosclerotic heart disease of native coronary artery without angina pectoris: Secondary | ICD-10-CM | POA: Diagnosis not present

## 2021-09-22 DIAGNOSIS — N320 Bladder-neck obstruction: Secondary | ICD-10-CM | POA: Diagnosis not present

## 2021-09-22 DIAGNOSIS — R531 Weakness: Secondary | ICD-10-CM | POA: Diagnosis not present

## 2021-09-22 DIAGNOSIS — N139 Obstructive and reflux uropathy, unspecified: Secondary | ICD-10-CM | POA: Diagnosis not present

## 2021-09-22 DIAGNOSIS — M6281 Muscle weakness (generalized): Secondary | ICD-10-CM | POA: Diagnosis not present

## 2021-09-22 DIAGNOSIS — E559 Vitamin D deficiency, unspecified: Secondary | ICD-10-CM | POA: Diagnosis not present

## 2021-09-22 DIAGNOSIS — N183 Chronic kidney disease, stage 3 unspecified: Secondary | ICD-10-CM | POA: Diagnosis not present

## 2021-09-22 DIAGNOSIS — R262 Difficulty in walking, not elsewhere classified: Secondary | ICD-10-CM | POA: Diagnosis not present

## 2021-09-22 DIAGNOSIS — Z7982 Long term (current) use of aspirin: Secondary | ICD-10-CM | POA: Diagnosis not present

## 2021-09-22 DIAGNOSIS — T83091A Other mechanical complication of indwelling urethral catheter, initial encounter: Secondary | ICD-10-CM | POA: Diagnosis not present

## 2021-09-22 DIAGNOSIS — I129 Hypertensive chronic kidney disease with stage 1 through stage 4 chronic kidney disease, or unspecified chronic kidney disease: Secondary | ICD-10-CM | POA: Diagnosis not present

## 2021-09-22 DIAGNOSIS — E43 Unspecified severe protein-calorie malnutrition: Secondary | ICD-10-CM | POA: Diagnosis not present

## 2021-09-22 DIAGNOSIS — E785 Hyperlipidemia, unspecified: Secondary | ICD-10-CM | POA: Diagnosis not present

## 2021-09-22 DIAGNOSIS — Z743 Need for continuous supervision: Secondary | ICD-10-CM | POA: Diagnosis not present

## 2021-09-22 DIAGNOSIS — R000 Tachycardia, unspecified: Secondary | ICD-10-CM | POA: Diagnosis not present

## 2021-09-22 DIAGNOSIS — N179 Acute kidney failure, unspecified: Secondary | ICD-10-CM | POA: Diagnosis not present

## 2021-09-22 DIAGNOSIS — K4090 Unilateral inguinal hernia, without obstruction or gangrene, not specified as recurrent: Secondary | ICD-10-CM | POA: Diagnosis not present

## 2021-09-22 DIAGNOSIS — E46 Unspecified protein-calorie malnutrition: Secondary | ICD-10-CM | POA: Diagnosis not present

## 2021-09-22 DIAGNOSIS — M1990 Unspecified osteoarthritis, unspecified site: Secondary | ICD-10-CM | POA: Diagnosis not present

## 2021-09-22 DIAGNOSIS — R1030 Lower abdominal pain, unspecified: Secondary | ICD-10-CM | POA: Diagnosis not present

## 2021-09-22 DIAGNOSIS — T83098A Other mechanical complication of other indwelling urethral catheter, initial encounter: Secondary | ICD-10-CM | POA: Diagnosis not present

## 2021-09-22 DIAGNOSIS — R339 Retention of urine, unspecified: Secondary | ICD-10-CM | POA: Diagnosis not present

## 2021-09-22 DIAGNOSIS — E1122 Type 2 diabetes mellitus with diabetic chronic kidney disease: Secondary | ICD-10-CM | POA: Diagnosis not present

## 2021-09-22 DIAGNOSIS — Z79899 Other long term (current) drug therapy: Secondary | ICD-10-CM | POA: Diagnosis not present

## 2021-09-22 DIAGNOSIS — Z87891 Personal history of nicotine dependence: Secondary | ICD-10-CM | POA: Diagnosis not present

## 2021-09-22 DIAGNOSIS — R338 Other retention of urine: Secondary | ICD-10-CM | POA: Diagnosis not present

## 2021-09-22 DIAGNOSIS — F32A Depression, unspecified: Secondary | ICD-10-CM | POA: Diagnosis not present

## 2021-09-22 DIAGNOSIS — K5130 Ulcerative (chronic) rectosigmoiditis without complications: Secondary | ICD-10-CM | POA: Diagnosis not present

## 2021-09-22 DIAGNOSIS — R739 Hyperglycemia, unspecified: Secondary | ICD-10-CM | POA: Diagnosis not present

## 2021-09-22 DIAGNOSIS — Z7401 Bed confinement status: Secondary | ICD-10-CM | POA: Diagnosis not present

## 2021-09-22 DIAGNOSIS — Z86010 Personal history of colonic polyps: Secondary | ICD-10-CM | POA: Diagnosis not present

## 2021-09-22 DIAGNOSIS — R0902 Hypoxemia: Secondary | ICD-10-CM | POA: Diagnosis not present

## 2021-09-22 DIAGNOSIS — K429 Umbilical hernia without obstruction or gangrene: Secondary | ICD-10-CM | POA: Diagnosis not present

## 2021-09-22 DIAGNOSIS — K529 Noninfective gastroenteritis and colitis, unspecified: Secondary | ICD-10-CM | POA: Diagnosis not present

## 2021-09-22 DIAGNOSIS — R6889 Other general symptoms and signs: Secondary | ICD-10-CM | POA: Diagnosis not present

## 2021-09-22 DIAGNOSIS — I10 Essential (primary) hypertension: Secondary | ICD-10-CM | POA: Diagnosis not present

## 2021-09-22 DIAGNOSIS — Y829 Unspecified medical devices associated with adverse incidents: Secondary | ICD-10-CM | POA: Diagnosis not present

## 2021-09-22 DIAGNOSIS — N1330 Unspecified hydronephrosis: Secondary | ICD-10-CM | POA: Diagnosis not present

## 2021-09-22 DIAGNOSIS — R197 Diarrhea, unspecified: Secondary | ICD-10-CM | POA: Diagnosis not present

## 2021-09-22 DIAGNOSIS — R2681 Unsteadiness on feet: Secondary | ICD-10-CM | POA: Diagnosis not present

## 2021-09-22 DIAGNOSIS — I779 Disorder of arteries and arterioles, unspecified: Secondary | ICD-10-CM | POA: Diagnosis not present

## 2021-09-22 DIAGNOSIS — T82519A Breakdown (mechanical) of unspecified cardiac and vascular devices and implants, initial encounter: Secondary | ICD-10-CM | POA: Diagnosis not present

## 2021-09-22 DIAGNOSIS — R109 Unspecified abdominal pain: Secondary | ICD-10-CM | POA: Diagnosis not present

## 2021-09-26 ENCOUNTER — Encounter: Payer: Self-pay | Admitting: Internal Medicine

## 2021-09-26 DIAGNOSIS — N183 Chronic kidney disease, stage 3 unspecified: Secondary | ICD-10-CM | POA: Diagnosis not present

## 2021-09-26 DIAGNOSIS — K529 Noninfective gastroenteritis and colitis, unspecified: Secondary | ICD-10-CM | POA: Diagnosis not present

## 2021-09-26 DIAGNOSIS — E119 Type 2 diabetes mellitus without complications: Secondary | ICD-10-CM | POA: Diagnosis not present

## 2021-09-26 DIAGNOSIS — R338 Other retention of urine: Secondary | ICD-10-CM | POA: Diagnosis not present

## 2021-09-26 DIAGNOSIS — I10 Essential (primary) hypertension: Secondary | ICD-10-CM | POA: Diagnosis not present

## 2021-09-26 DIAGNOSIS — E785 Hyperlipidemia, unspecified: Secondary | ICD-10-CM | POA: Diagnosis not present

## 2021-09-26 DIAGNOSIS — N320 Bladder-neck obstruction: Secondary | ICD-10-CM | POA: Diagnosis not present

## 2021-09-26 DIAGNOSIS — J309 Allergic rhinitis, unspecified: Secondary | ICD-10-CM | POA: Diagnosis not present

## 2021-09-26 DIAGNOSIS — M1990 Unspecified osteoarthritis, unspecified site: Secondary | ICD-10-CM | POA: Diagnosis not present

## 2021-09-26 DIAGNOSIS — I2510 Atherosclerotic heart disease of native coronary artery without angina pectoris: Secondary | ICD-10-CM | POA: Diagnosis not present

## 2021-09-26 DIAGNOSIS — F32A Depression, unspecified: Secondary | ICD-10-CM | POA: Diagnosis not present

## 2021-09-26 DIAGNOSIS — E559 Vitamin D deficiency, unspecified: Secondary | ICD-10-CM | POA: Diagnosis not present

## 2021-09-26 DIAGNOSIS — G4700 Insomnia, unspecified: Secondary | ICD-10-CM | POA: Insufficient documentation

## 2021-09-26 NOTE — Assessment & Plan Note (Signed)
Also for trazodone qhs prn,  to f/u any worsening symptoms or concerns

## 2021-09-26 NOTE — Assessment & Plan Note (Signed)
With BP low normal and general weakness - for decreased lotension to 20 mg qd

## 2021-09-26 NOTE — Assessment & Plan Note (Signed)
Foley in place, pt to f/u with urology as planned dec 13

## 2021-09-26 NOTE — Assessment & Plan Note (Signed)
Stable overall, to continue to hold metformin

## 2021-09-26 NOTE — Assessment & Plan Note (Signed)
Symptomatically resolved,  to f/u any worsening symptoms or concerns 

## 2021-09-26 NOTE — Assessment & Plan Note (Signed)
With recent AKi on CKD - for f/u lab

## 2021-09-26 NOTE — Assessment & Plan Note (Signed)
Also for referral GI to f/u

## 2021-09-27 ENCOUNTER — Emergency Department (HOSPITAL_COMMUNITY)
Admission: EM | Admit: 2021-09-27 | Discharge: 2021-09-28 | Disposition: A | Discharge status: Home / Self Care | Payer: Medicare Other | Attending: Emergency Medicine | Admitting: Emergency Medicine

## 2021-09-27 DIAGNOSIS — R000 Tachycardia, unspecified: Secondary | ICD-10-CM | POA: Insufficient documentation

## 2021-09-27 DIAGNOSIS — R197 Diarrhea, unspecified: Secondary | ICD-10-CM | POA: Diagnosis not present

## 2021-09-27 DIAGNOSIS — K6389 Other specified diseases of intestine: Secondary | ICD-10-CM | POA: Diagnosis not present

## 2021-09-27 DIAGNOSIS — Z79899 Other long term (current) drug therapy: Secondary | ICD-10-CM | POA: Insufficient documentation

## 2021-09-27 DIAGNOSIS — E1122 Type 2 diabetes mellitus with diabetic chronic kidney disease: Secondary | ICD-10-CM | POA: Diagnosis not present

## 2021-09-27 DIAGNOSIS — T839XXA Unspecified complication of genitourinary prosthetic device, implant and graft, initial encounter: Secondary | ICD-10-CM

## 2021-09-27 DIAGNOSIS — R339 Retention of urine, unspecified: Secondary | ICD-10-CM | POA: Insufficient documentation

## 2021-09-27 DIAGNOSIS — T83098A Other mechanical complication of other indwelling urethral catheter, initial encounter: Secondary | ICD-10-CM | POA: Insufficient documentation

## 2021-09-27 DIAGNOSIS — R739 Hyperglycemia, unspecified: Secondary | ICD-10-CM | POA: Diagnosis not present

## 2021-09-27 DIAGNOSIS — K4090 Unilateral inguinal hernia, without obstruction or gangrene, not specified as recurrent: Secondary | ICD-10-CM | POA: Diagnosis not present

## 2021-09-27 DIAGNOSIS — Z743 Need for continuous supervision: Secondary | ICD-10-CM | POA: Diagnosis not present

## 2021-09-27 DIAGNOSIS — N1330 Unspecified hydronephrosis: Secondary | ICD-10-CM | POA: Diagnosis not present

## 2021-09-27 DIAGNOSIS — Z7982 Long term (current) use of aspirin: Secondary | ICD-10-CM | POA: Diagnosis not present

## 2021-09-27 DIAGNOSIS — K429 Umbilical hernia without obstruction or gangrene: Secondary | ICD-10-CM | POA: Diagnosis not present

## 2021-09-27 DIAGNOSIS — R1030 Lower abdominal pain, unspecified: Secondary | ICD-10-CM | POA: Diagnosis not present

## 2021-09-27 DIAGNOSIS — N183 Chronic kidney disease, stage 3 unspecified: Secondary | ICD-10-CM | POA: Insufficient documentation

## 2021-09-27 DIAGNOSIS — Z87891 Personal history of nicotine dependence: Secondary | ICD-10-CM | POA: Insufficient documentation

## 2021-09-27 DIAGNOSIS — I129 Hypertensive chronic kidney disease with stage 1 through stage 4 chronic kidney disease, or unspecified chronic kidney disease: Secondary | ICD-10-CM | POA: Insufficient documentation

## 2021-09-27 DIAGNOSIS — N179 Acute kidney failure, unspecified: Secondary | ICD-10-CM | POA: Diagnosis not present

## 2021-09-27 NOTE — ED Triage Notes (Signed)
Pt from [place] via [place] c/o urinary retention. Went to urology yesterday, "they did something" & hasn't been able to void since. BRBPR since AM. Admitted to [facility] 11/21 for same  VSS

## 2021-09-27 NOTE — ED Provider Notes (Signed)
MSE was initiated and I personally evaluated the patient and placed orders (if any) at  10:44 PM on September 27, 2021.  Patient admitted 11/21 for urinary retention, d/ch with foley, seen by urology yesterday where foley was changed to "another kind". He reports no passage of urine since that appointment. C/O abdominal pain and distention. Started having diarrhea this morning, now with BRB. No fever, vomiting.   There were no vitals filed for this visit. There is no height or weight on file to calculate BMI.  Abdomen distended, tender     The patient appears stable so that the remainder of the MSE may be completed by another provider.   Charlann Lange, PA-C 09/27/21 2248    Wyvonnia Dusky, MD 09/28/21 [id number]

## 2021-09-27 NOTE — ED Notes (Signed)
Pt's foley catheter replaced per Gemma Payor PA. Charge RN informed of continued need for irrigation, pt to be roomed

## 2021-09-28 ENCOUNTER — Emergency Department (HOSPITAL_COMMUNITY): Payer: Medicare Other

## 2021-09-28 DIAGNOSIS — N320 Bladder-neck obstruction: Secondary | ICD-10-CM | POA: Diagnosis not present

## 2021-09-28 DIAGNOSIS — N1330 Unspecified hydronephrosis: Secondary | ICD-10-CM | POA: Diagnosis not present

## 2021-09-28 DIAGNOSIS — T83098A Other mechanical complication of other indwelling urethral catheter, initial encounter: Secondary | ICD-10-CM | POA: Diagnosis not present

## 2021-09-28 DIAGNOSIS — E119 Type 2 diabetes mellitus without complications: Secondary | ICD-10-CM | POA: Diagnosis not present

## 2021-09-28 DIAGNOSIS — R531 Weakness: Secondary | ICD-10-CM | POA: Diagnosis not present

## 2021-09-28 DIAGNOSIS — E46 Unspecified protein-calorie malnutrition: Secondary | ICD-10-CM | POA: Diagnosis not present

## 2021-09-28 DIAGNOSIS — I10 Essential (primary) hypertension: Secondary | ICD-10-CM | POA: Diagnosis not present

## 2021-09-28 DIAGNOSIS — K429 Umbilical hernia without obstruction or gangrene: Secondary | ICD-10-CM | POA: Diagnosis not present

## 2021-09-28 DIAGNOSIS — E785 Hyperlipidemia, unspecified: Secondary | ICD-10-CM | POA: Diagnosis not present

## 2021-09-28 DIAGNOSIS — K6389 Other specified diseases of intestine: Secondary | ICD-10-CM | POA: Diagnosis not present

## 2021-09-28 DIAGNOSIS — K922 Gastrointestinal hemorrhage, unspecified: Secondary | ICD-10-CM | POA: Diagnosis not present

## 2021-09-28 DIAGNOSIS — R339 Retention of urine, unspecified: Secondary | ICD-10-CM | POA: Diagnosis not present

## 2021-09-28 DIAGNOSIS — K4090 Unilateral inguinal hernia, without obstruction or gangrene, not specified as recurrent: Secondary | ICD-10-CM | POA: Diagnosis not present

## 2021-09-28 DIAGNOSIS — Z743 Need for continuous supervision: Secondary | ICD-10-CM | POA: Diagnosis not present

## 2021-09-28 LAB — CBC WITH DIFFERENTIAL/PLATELET
Abs Immature Granulocytes: 0.03 10*3/uL (ref 0.00–0.07)
Basophils Absolute: 0 10*3/uL (ref 0.0–0.1)
Basophils Relative: 0 %
Eosinophils Absolute: 0 10*3/uL (ref 0.0–0.5)
Eosinophils Relative: 0 %
HCT: 35.6 % — ABNORMAL LOW (ref 39.0–52.0)
Hemoglobin: 11.7 g/dL — ABNORMAL LOW (ref 13.0–17.0)
Immature Granulocytes: 0 %
Lymphocytes Relative: 8 %
Lymphs Abs: 0.6 10*3/uL — ABNORMAL LOW (ref 0.7–4.0)
MCH: 27.9 pg (ref 26.0–34.0)
MCHC: 32.9 g/dL (ref 30.0–36.0)
MCV: 84.8 fL (ref 80.0–100.0)
Monocytes Absolute: 0.7 10*3/uL (ref 0.1–1.0)
Monocytes Relative: 9 %
Neutro Abs: 6.3 10*3/uL (ref 1.7–7.7)
Neutrophils Relative %: 83 %
Platelets: 243 10*3/uL (ref 150–400)
RBC: 4.2 MIL/uL — ABNORMAL LOW (ref 4.22–5.81)
RDW: 14.2 % (ref 11.5–15.5)
WBC: 7.6 10*3/uL (ref 4.0–10.5)
nRBC: 0 % (ref 0.0–0.2)

## 2021-09-28 LAB — URINALYSIS, ROUTINE W REFLEX MICROSCOPIC
Bilirubin Urine: NEGATIVE
Glucose, UA: >=500 mg/dL — AB
Ketones, ur: NEGATIVE mg/dL
Leukocytes,Ua: NEGATIVE
Nitrite: NEGATIVE
Protein, ur: 100 mg/dL — AB
Specific Gravity, Urine: 1.02 (ref 1.005–1.030)
pH: 6 (ref 5.0–8.0)

## 2021-09-28 LAB — URINALYSIS, MICROSCOPIC (REFLEX): RBC / HPF: >50 RBC/hpf (ref 0–5)

## 2021-09-28 LAB — COMPREHENSIVE METABOLIC PANEL
ALT: 24 U/L (ref 0–44)
AST: 14 U/L — ABNORMAL LOW (ref 15–41)
Albumin: 2.6 g/dL — ABNORMAL LOW (ref 3.5–5.0)
Alkaline Phosphatase: 32 U/L — ABNORMAL LOW (ref 38–126)
Anion gap: 9 (ref 5–15)
BUN: 30 mg/dL — ABNORMAL HIGH (ref 8–23)
CO2: 26 mmol/L (ref 22–32)
Calcium: 9.1 mg/dL (ref 8.9–10.3)
Chloride: 101 mmol/L (ref 98–111)
Creatinine, Ser: 1.78 mg/dL — ABNORMAL HIGH (ref 0.61–1.24)
GFR, Estimated: 38 mL/min — ABNORMAL LOW (ref >60)
Glucose, Bld: 311 mg/dL — ABNORMAL HIGH (ref 70–99)
Potassium: 3.2 mmol/L — ABNORMAL LOW (ref 3.5–5.1)
Sodium: 136 mmol/L (ref 135–145)
Total Bilirubin: 0.5 mg/dL (ref 0.3–1.2)
Total Protein: 6.2 g/dL — ABNORMAL LOW (ref 6.5–8.1)

## 2021-09-28 MED ORDER — SODIUM CHLORIDE 0.9 % IV BOLUS
500.0000 mL | Freq: Once | INTRAVENOUS | Status: AC
  Administered 2021-09-28: 500 mL via INTRAVENOUS

## 2021-09-28 NOTE — ED Notes (Signed)
ED Provider at bedside. 

## 2021-09-28 NOTE — ED Notes (Signed)
Water given for fluid challenge.  Explained to pt that he needs to dry and drink

## 2021-09-28 NOTE — ED Provider Notes (Signed)
[facility] EMERGENCY DEPARTMENT Provider Note   CSN: [id number] Arrival date & time: 09/27/21  2155     History Chief Complaint  Patient presents with   Urinary Retention    Neil [name] is a 80 y.o. male.  The history is provided by the patient and medical records.  Neil [name] is a 80 y.o. male who presents to the Emergency Department complaining of catheter issue.  He presents to the ED due to concern for his catheter not functioning properly.  On 11/24 he had a foley catheter placed.  On Tuesday he saw his Urologist and a voiding trial was performed and he did not pass.  He had a new catheter placed in the office and had a different catheter placed.  He states that the small volume currently in his bag is there from yesterday.  He is now having overflow urine around the catheter.  He is now having diarrhea since yesterday.  Has associated lower abdominal pain.  No fever, vomiting, nausea.      Past Medical History:  Diagnosis Date   ALLERGIC RHINITIS 12/04/2007   Arthritis    bilateral hands, shoulders, and knees   BENIGN PROSTATIC HYPERTROPHY 06/05/2007   BUNDLE BRANCH BLOCK, RIGHT 07/21/2009   BURSITIS, RIGHT SHOULDER 11/06/2010   Choledocholithiasis    CKD (chronic kidney disease) stage 3, GFR 30-59 ml/min ([place]) 08/02/2017   COLONIC POLYPS, HX OF 06/05/2007   tubular adenoma also 02/19/2013   Coronary artery calcification seen on CT scan 03/07/2016   DIABETES MELLITUS, TYPE [name] 12/04/2007   pt states not diabetic-no meds 02-05-13 PV   DIVERTICULOSIS, COLON 07/21/2009   ERECTILE DYSFUNCTION 06/05/2007   Gallstones    HYPERLIPIDEMIA 06/05/2007   on meds   HYPERTENSION 06/05/2007   on meds   Hypotension    INGUINAL HERNIA, RIGHT, SMALL 11/06/2010   Liver abscess 05/23/2015   SKIN LESION 06/03/2008    Patient Active Problem List   Diagnosis Date Noted   Insomnia 09/26/2021   Colitis 09/11/2021   Acute urinary retention 09/11/2021    GI bleed 09/06/2021   AKI (acute kidney injury) ([place]) 09/06/2021   Protein-calorie malnutrition, severe 09/06/2021   ARF (acute renal failure) ([place]) 09/04/2021   Diabetes mellitus type 2 in nonobese ([place]) 09/04/2021   Generalized weakness    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   Low back pain 11/27/2020   Dyspnea on exertion 05/24/2020   Tremor 11/25/2019   Vitamin D deficiency 11/25/2019   Depression 05/28/2019   Bursitis 05/28/2018   CKD (chronic kidney disease) stage 3, GFR 30-59 ml/min (H[facility]) 08/02/2017   Increased prostate specific antigen (PSA) velocity 08/02/2017   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   Coronary artery calcification seen on CT scan 03/07/2016   Eosinophilia 12/30/2015   Hepatic abscess    Streptococcal infection    Loss of appetite    Normocytic anemia 05/24/2015   Malnutrition of moderate degree ([place]) 05/24/2015   Essential hypertension, benign    Pyogenic hepatic abscess    Gall stones, common bile duct    Calculus of bile duct with obstruction and without cholangitis or cholecystitis    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   Calculus of bile duct with acute cholangitis with obstruction    Gall stones 02/13/2015   Abnormal urine 02/08/2015   Skin lesion of back 12/12/2012   Hypertriglyceridemia 11/14/2011   Encounter for well adult exam with abnormal findings 11/11/2011   INGUINAL HERNIA, RIGHT,  SMALL 11/06/2010   Disorder of bursae and tendons in shoulder region 11/06/2010   BUNDLE BRANCH BLOCK, RIGHT 07/21/2009   DIVERTICULOSIS, COLON 07/21/2009   Diabetes ([place]) 12/04/2007   ALLERGIC RHINITIS 12/04/2007   Hyperlipidemia 06/05/2007   ERECTILE DYSFUNCTION 06/05/2007   BPH (benign prostatic hyperplasia) 06/05/2007   COLONIC POLYPS, HX OF 06/05/2007    Past Surgical History:  Procedure Laterality Date   BIOPSY  09/08/2021   Procedure: BIOPSY;  Surgeon: Thornton Park, MD;  Location: [facility] ENDOSCOPY;  Service:  Endoscopy;;   CHOLECYSTECTOMY N/A 03/02/2015   Procedure: LAPAROSCOPIC CHOLECYSTECTOMY;  Surgeon: Fanny Skates, MD;  Location: [facility] ORS;  Service: General;  Laterality: N/A;   COLONOSCOPY  2017   JP-MAC-suprep (good)-TA   ERCP N/A 03/01/2015   Procedure: ENDOSCOPIC RETROGRADE CHOLANGIOPANCREATOGRAPHY (ERCP);  Surgeon: Irene Shipper, MD;  Location: [facility];  Service: Endoscopy;  Laterality: N/A;  Dr Dalbert Batman wants patient to stay overnight after ERCP   ERCP N/A 05/03/2015   Procedure: ENDOSCOPIC RETROGRADE CHOLANGIOPANCREATOGRAPHY (ERCP);  Surgeon: Irene Shipper, MD;  Location: [facility];  Service: Endoscopy;  Laterality: N/A;   FINGER SURGERY Left    "little finger"   FLEXIBLE SIGMOIDOSCOPY N/A 09/08/2021   Procedure: FLEXIBLE SIGMOIDOSCOPY;  Surgeon: Thornton Park, MD;  Location: [facility];  Service: Endoscopy;  Laterality: N/A;   HEMORRHOID SURGERY  1973   INGUINAL HERNIA REPAIR Right 2014   8'14 repair   POLYPECTOMY     TONSILLECTOMY     WISDOM TOOTH EXTRACTION  1972       Family History  Problem Relation Age of Onset   Colon polyps Mother 21   Colon cancer Mother 75   Brain cancer Mother 73       mets from colon cancer   Colon cancer Paternal Uncle    Colon polyps Paternal Uncle    Rectal cancer Neg Hx    Stomach cancer Neg Hx    Esophageal cancer Neg Hx     Social History   Tobacco Use   Smoking status: Former    Types: Cigarettes    Quit date: 2008    Years since quitting: 14.9   Smokeless tobacco: Never  Vaping Use   Vaping Use: Never used  Substance Use Topics   Alcohol use: No    Alcohol/week: 0.0 standard drinks   Drug use: No    Home Medications Prior to Admission medications   Medication Sig Start Date End Date Taking? Authorizing Provider  acetaminophen (TYLENOL) 325 MG tablet Take 2 tablets (650 mg total) by mouth every 6 (six) hours as needed for mild pain (or Fever >/= 101). 09/11/21   Debbe Odea, MD  acetaminophen (TYLENOL) 500 MG  tablet Take 1,500 mg by mouth every 6 (six) hours as needed for mild pain.    [provider]  Alpha-D-Galactosidase (BEANO PO) Take 1 tablet by mouth daily as needed (flatulence).    [provider]  aspirin 81 MG EC tablet Take 81 mg by mouth daily. 10/15/08   [provider]  benazepril (LOTENSIN) 40 MG tablet Take 0.5 tablets (20 mg total) by mouth daily. 09/21/21   Biagio Borg, MD  cholecalciferol (VITAMIN D) 1000 units tablet Take 1,000 Units by mouth 2 (two) times daily.    [provider]  citalopram (CELEXA) 10 MG tablet TAKE 1 TABLET BY MOUTH  DAILY Patient taking differently: Take 10 mg by mouth daily. 07/12/21   Biagio Borg, MD  Cyanocobalamin (B-12) 1000 MCG TABS  Take 1,000 mcg by mouth daily. 01/25/19   [provider]  cyclobenzaprine (FLEXERIL) 5 MG tablet TAKE 1 TABLET BY MOUTH 3  TIMES DAILY AS NEEDED FOR  MUSCLE SPASM(S) Patient taking differently: Take 5 mg by mouth 3 (three) times daily as needed for muscle spasms. 05/09/21   Biagio Borg, MD  fenofibrate 160 MG tablet TAKE 1 TABLET BY MOUTH AT  BEDTIME Patient taking differently: Take 160 mg by mouth at bedtime. 07/25/21   Biagio Borg, MD  finasteride (PROSCAR) 5 MG tablet Take 1 tablet (5 mg total) by mouth daily. 09/21/21   Biagio Borg, MD  metoprolol succinate (TOPROL-XL) 50 MG 24 hr tablet Take 1 tablet (50 mg total) by mouth daily. Take with or immediately following a meal. 11/24/20   Biagio Borg, MD  Multiple Vitamin (MULTIVITAMIN WITH MINERALS) TABS tablet Take 1 tablet by mouth daily. 09/12/21   Debbe Odea, MD  sodium chloride (OCEAN) 0.65 % nasal spray Place 1 spray into the nose daily as needed for congestion.     [provider]  terazosin (HYTRIN) 2 MG capsule Take 1 capsule (2 mg total) by mouth at bedtime. 09/21/21   Biagio Borg, MD  traZODone (DESYREL) 50 MG tablet Take 0.5-1 tablets (25-50 mg total) by mouth at bedtime as needed for sleep. 09/21/21    Biagio Borg, MD    Allergies    Statins, Zanaflex [tizanidine], and Zetia [ezetimibe]  Review of Systems   Review of Systems  All other systems reviewed and are negative.  Physical Exam Updated Vital Signs BP (!) 151/83    Pulse 79    Resp 18    SpO2 99%   Physical Exam Vitals and nursing note reviewed.  Constitutional:      Appearance: He is well-developed.  HENT:     Head: Normocephalic and atraumatic.  Cardiovascular:     Rate and Rhythm: Regular rhythm. Tachycardia present.     Heart sounds: No murmur heard. Pulmonary:     Effort: Pulmonary effort is normal. No respiratory distress.     Breath sounds: Normal breath sounds.  Abdominal:     General: There is distension.     Palpations: Abdomen is soft.     Tenderness: There is abdominal tenderness. There is no guarding or rebound.  Genitourinary:    Comments: 31f catheter in place.  Scant (less than 5cc) urine in the tubing.   Musculoskeletal:        General: No tenderness.  Skin:    General: Skin is warm and dry.  Neurological:     Mental Status: He is alert and oriented to person, place, and time.  Psychiatric:        Behavior: Behavior normal.    ED Results / Procedures / Treatments   Labs (all labs ordered are listed, but only abnormal results are displayed) Labs Reviewed  URINALYSIS, ROUTINE W REFLEX MICROSCOPIC - Abnormal; Notable for the following components:      Result Value   Glucose, UA >=500 (*)    Hgb urine dipstick LARGE (*)    Protein, ur 100 (*)    All other components within normal limits  COMPREHENSIVE METABOLIC PANEL - Abnormal; Notable for the following components:   Potassium 3.2 (*)    Glucose, Bld 311 (*)    BUN 30 (*)    Creatinine, Ser 1.78 (*)    Total Protein 6.2 (*)    Albumin 2.6 (*)    AST  14 (*)    Alkaline Phosphatase 32 (*)    GFR, Estimated 38 (*)    All other components within normal limits  CBC WITH DIFFERENTIAL/PLATELET - Abnormal; Notable for the following  components:   RBC 4.20 (*)    Hemoglobin 11.7 (*)    HCT 35.6 (*)    Lymphs Abs 0.6 (*)    All other components within normal limits  URINALYSIS, MICROSCOPIC (REFLEX) - Abnormal; Notable for the following components:   Bacteria, UA RARE (*)    All other components within normal limits  C DIFFICILE QUICK SCREEN W PCR REFLEX      EKG None  Radiology CT Abdomen Pelvis Wo Contrast  Result Date: 09/28/2021 CLINICAL DATA:  Right lower quadrant pain. EXAM: CT ABDOMEN AND PELVIS WITHOUT CONTRAST TECHNIQUE: Multidetector CT imaging of the abdomen and pelvis was performed following the standard protocol without IV contrast. COMPARISON:  CT without contrast 09/04/2021, CT with contrast 06/29/2015. FINDINGS: Lower chest: There is mild posterior atelectasis in the lung bases. The cardiac size is normal with coronary artery calcifications, lipomatous hypertrophy of the interatrial septum. There is no pericardial fluid Hepatobiliary: Left lobe pneumobilia is again shown and has been seen previously with old cholecystectomy. No biliary dilatation is seen and no focal liver mass identifiable without contrast. Pancreas: No masses seen without contrast or ductal dilatation. Spleen: Unremarkable without contrast.  No splenomegaly. Adrenals/Urinary Tract: There is no adrenal mass. No focal abnormality of unenhanced renal cortex. There are no intrarenal stones. Perinephric stranding is symmetric and appears similar to prior studies. On 09/04/2021 there was mild bilateral hydroureteronephrosis which is no longer seen. The bladder is catheterized and there is increased perivesical stranding which may be seen with cystitis. There are no intravesical stones. Stomach/Bowel: Mild wall prominence of the sigmoid colon is again noted. There are diverticula without inflammatory reaction. Moderate fecal stasis is again seen, with normal retrocecal appendix. There is no small bowel obstruction , no gastric or small bowel  thickening. As before, a 5 cm segment of the mid sigmoid demonstrates disproportionate wall prominence but again no inflammatory reaction. Vascular/Lymphatic: The aorta and iliac arteries are heavily calcified. There is no AAA. There are no enlarged abdominal or pelvic nodes. Reproductive: Marked prostatomegaly, the prostate again approaching 8 cm in size. Other: Small umbilical and inguinal fat hernias. Minimal scattered pelvic ascites is not significantly changed. There is no abdominal ascites. There is no free air, hemorrhage or abscess. 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 Mild osteopenia and degenerative change of the spine. There are no worrisome regional bone lesions. IMPRESSION: 1. Normal retrocecal appendix with no findings to definitively explain localized right lower quadrant pain. 2. Interval decompression of the renal collecting systems and ureters. Redemonstrated marked prostatomegaly with bladder catheterization. 3. Increased perivesical edema, concerning for cystitis. 4. There is mild thickening in the sigmoid colon wall in general, with scattered diverticula, but a 5 cm segment of the mid sigmoid (see axial images 52-58) demonstrates disproportionate circumferential wall thickening, which was seen previously. Colonoscopy follow-up is recommended to exclude infiltrating or encircling neoplasm. There are no acute inflammatory changes or adjacent enlarged lymph nodes. 5. Heavy aortic and iliac atherosclerosis. 6. Chronic pneumobilia. 7. Minimal pelvic ascites additional findings discussed above. Electronically Signed   By: Telford Nab M.D.   On: 09/28/2021 03:23    Procedures Procedures   Medications Ordered in ED Medications  sodium chloride 0.9 % bolus 500 mL (0 mLs Intravenous Stopped 09/28/21 0419)  ED Course  I have reviewed the triage vital signs and the nursing notes.  Pertinent labs & imaging results that were available during my care of the patient were  reviewed by me and considered in my medical decision making (see chart for details).    MDM Rules/Calculators/A&P                          Patient here for evaluation of decreased urinary output in setting of recent catheter change.  When patient arrived to the emergency department his catheter was exchanged again.  He again had decreased urinary output.  The catheter was then adjusted with return of significant amount of urine, approximately 1000 mL.  Following placement of catheter patient did have some ongoing right lower quadrant abdominal tenderness, with reports of diarrhea and abdominal pain a CT abdomen pelvis was obtained.  CT scan with chronic findings.  There is some perivesicular stranding on imaging, favor that this is related to recent bladder distention and not acute urinary tract infection.  Labs with elevation in creatinine when compared to prior, he was treated with IV fluid hydration.  Current presentation is not consistent with acute sepsis, acute infection.  Discussed with patient incidental findings on imaging.  On reassessment the department he is in feeling improved and his diarrhea did resolve after drainage of his bladder.  Plan to discharge home with catheter in place with urology follow-up and return precautions.  Final Clinical Impression(s) / ED Diagnoses Final diagnoses:  Problem with Foley catheter, initial encounter ([place])  AKI (acute kidney injury) [facility])    Rx / DC Orders ED Discharge Orders     None        Quintella Reichert, MD 09/28/21 [phone]

## 2021-09-28 NOTE — Discharge Instructions (Addendum)
Drink plenty of fluids.  Get rechecked quickly if your catheter stops draining or you have new concerning symptoms.    Your kidney function was abnormal today.  You will need to have your kidney function rechecked in the next 3-5 days.    Please follow up with your family doctor in the next several days for recheck.

## 2021-09-28 NOTE — ED Notes (Signed)
Attempted to call [facility] x2 to give report.  Pt awaiting [facility] for transport. [facility] has been notified

## 2021-09-28 NOTE — ED Notes (Signed)
Patient transported to CT via stretcher. Pt in NAD

## 2021-10-02 ENCOUNTER — Emergency Department (HOSPITAL_COMMUNITY)
Admission: EM | Admit: 2021-10-02 | Discharge: 2021-10-02 | Disposition: A | Discharge status: Skilled Nursing Facility | Payer: Medicare Other | Attending: Emergency Medicine | Admitting: Emergency Medicine

## 2021-10-02 ENCOUNTER — Encounter (HOSPITAL_COMMUNITY): Payer: Self-pay

## 2021-10-02 ENCOUNTER — Other Ambulatory Visit: Payer: Self-pay

## 2021-10-02 DIAGNOSIS — T839XXA Unspecified complication of genitourinary prosthetic device, implant and graft, initial encounter: Secondary | ICD-10-CM

## 2021-10-02 DIAGNOSIS — I129 Hypertensive chronic kidney disease with stage 1 through stage 4 chronic kidney disease, or unspecified chronic kidney disease: Secondary | ICD-10-CM | POA: Insufficient documentation

## 2021-10-02 DIAGNOSIS — Y829 Unspecified medical devices associated with adverse incidents: Secondary | ICD-10-CM | POA: Diagnosis not present

## 2021-10-02 DIAGNOSIS — N183 Chronic kidney disease, stage 3 unspecified: Secondary | ICD-10-CM | POA: Insufficient documentation

## 2021-10-02 DIAGNOSIS — Z743 Need for continuous supervision: Secondary | ICD-10-CM | POA: Diagnosis not present

## 2021-10-02 DIAGNOSIS — E1122 Type 2 diabetes mellitus with diabetic chronic kidney disease: Secondary | ICD-10-CM | POA: Insufficient documentation

## 2021-10-02 DIAGNOSIS — R109 Unspecified abdominal pain: Secondary | ICD-10-CM | POA: Diagnosis not present

## 2021-10-02 DIAGNOSIS — T83091A Other mechanical complication of indwelling urethral catheter, initial encounter: Secondary | ICD-10-CM | POA: Diagnosis not present

## 2021-10-02 DIAGNOSIS — Z87891 Personal history of nicotine dependence: Secondary | ICD-10-CM | POA: Insufficient documentation

## 2021-10-02 DIAGNOSIS — R339 Retention of urine, unspecified: Secondary | ICD-10-CM | POA: Diagnosis not present

## 2021-10-02 DIAGNOSIS — R6889 Other general symptoms and signs: Secondary | ICD-10-CM | POA: Diagnosis not present

## 2021-10-02 DIAGNOSIS — Z7401 Bed confinement status: Secondary | ICD-10-CM | POA: Diagnosis not present

## 2021-10-02 DIAGNOSIS — T82519A Breakdown (mechanical) of unspecified cardiac and vascular devices and implants, initial encounter: Secondary | ICD-10-CM | POA: Diagnosis not present

## 2021-10-02 DIAGNOSIS — I10 Essential (primary) hypertension: Secondary | ICD-10-CM | POA: Diagnosis not present

## 2021-10-02 DIAGNOSIS — T83098A Other mechanical complication of other indwelling urethral catheter, initial encounter: Secondary | ICD-10-CM | POA: Diagnosis not present

## 2021-10-02 DIAGNOSIS — R0902 Hypoxemia: Secondary | ICD-10-CM | POA: Diagnosis not present

## 2021-10-02 LAB — URINALYSIS, ROUTINE W REFLEX MICROSCOPIC
Bilirubin Urine: NEGATIVE
Glucose, UA: 150 mg/dL — AB
Ketones, ur: NEGATIVE mg/dL
Leukocytes,Ua: NEGATIVE
Nitrite: NEGATIVE
Protein, ur: 100 mg/dL — AB
RBC / HPF: >50 RBC/hpf — ABNORMAL HIGH (ref 0–5)
Specific Gravity, Urine: 1.02 (ref 1.005–1.030)
pH: 6 (ref 5.0–8.0)

## 2021-10-02 NOTE — ED Provider Notes (Signed)
[facility] Emergency Department Provider Note MRN:  [id number]  Arrival date & time: 10/02/21     Chief Complaint   Abdominal Pain and Urinary Retention   History of Present Illness   Neil [name] is a 80 y.o. year-old male with a history of diabetes, CKD, BPH presenting to the ED with chief complaint of catheter problem.  Foley catheter does not seem to be working.  Having some lower abdominal fullness, feels like the catheter is leaking and not draining properly.  This is happened before recently.  Denies any fever, no other complaints.  Discomfort is moderate, constant.  Review of Systems  A complete 10 system review of systems was obtained and all systems are negative except as noted in the HPI and PMH.   Patient's Health History    Past Medical History:  Diagnosis Date   ALLERGIC RHINITIS 12/04/2007   Arthritis    bilateral hands, shoulders, and knees   BENIGN PROSTATIC HYPERTROPHY 06/05/2007   BUNDLE BRANCH BLOCK, RIGHT 07/21/2009   BURSITIS, RIGHT SHOULDER 11/06/2010   Choledocholithiasis    CKD (chronic kidney disease) stage 3, GFR 30-59 ml/min ([facility]) 08/02/2017   COLONIC POLYPS, HX OF 06/05/2007   tubular adenoma also 02/19/2013   Coronary artery calcification seen on CT scan 03/07/2016   DIABETES MELLITUS, TYPE [name] 12/04/2007   pt states not diabetic-no meds 02-05-13 PV   DIVERTICULOSIS, COLON 07/21/2009   ERECTILE DYSFUNCTION 06/05/2007   Gallstones    HYPERLIPIDEMIA 06/05/2007   on meds   HYPERTENSION 06/05/2007   on meds   Hypotension    INGUINAL HERNIA, RIGHT, SMALL 11/06/2010   Liver abscess 05/23/2015   SKIN LESION 06/03/2008    Past Surgical History:  Procedure Laterality Date   BIOPSY  09/08/2021   Procedure: BIOPSY;  Surgeon: Thornton Park, MD;  Location: [facility];  Service: Endoscopy;;   CHOLECYSTECTOMY N/A 03/02/2015   Procedure: LAPAROSCOPIC CHOLECYSTECTOMY;  Surgeon: Fanny Skates, MD;  Location: [facility];   Service: General;  Laterality: N/A;   COLONOSCOPY  2017   JP-MAC-suprep (good)-TA   ERCP N/A 03/01/2015   Procedure: ENDOSCOPIC RETROGRADE CHOLANGIOPANCREATOGRAPHY (ERCP);  Surgeon: Irene Shipper, MD;  Location: [facility];  Service: Endoscopy;  Laterality: N/A;  Dr Dalbert Batman wants patient to stay overnight after ERCP   ERCP N/A 05/03/2015   Procedure: ENDOSCOPIC RETROGRADE CHOLANGIOPANCREATOGRAPHY (ERCP);  Surgeon: Irene Shipper, MD;  Location: [facility];  Service: Endoscopy;  Laterality: N/A;   FINGER SURGERY Left    "little finger"   FLEXIBLE SIGMOIDOSCOPY N/A 09/08/2021   Procedure: FLEXIBLE SIGMOIDOSCOPY;  Surgeon: Thornton Park, MD;  Location[facility];  Service: Endoscopy;  Laterality: N/A;   HEMORRHOID SURGERY  1973   INGUINAL HERNIA REPAIR Right 2014   8'14 repair   POLYPECTOMY     TONSILLECTOMY     WISDOM TOOTH EXTRACTION  1972    Family History  Problem Relation Age of Onset   Colon polyps Mother 75   Colon cancer Mother 41   Brain cancer Mother 33       mets from colon cancer   Colon cancer Paternal Uncle    Colon polyps Paternal Uncle    Rectal cancer Neg Hx    Stomach cancer Neg Hx    Esophageal cancer Neg Hx     Social History   Socioeconomic History   Marital status: Widowed    Spouse name: Not on file   Number of children: Not on file   Years  of education: Not on file   Highest education level: Not on file  Occupational History   Occupation: retired  Tobacco Use   Smoking status: Former    Types: Cigarettes    Quit date: 2008    Years since quitting: 14.9   Smokeless tobacco: Never  Vaping Use   Vaping Use: Never used  Substance and Sexual Activity   Alcohol use: No    Alcohol/week: 0.0 standard drinks   Drug use: No   Sexual activity: Not Currently  Other Topics Concern   Not on file  Social History Narrative   Not on file   Social Determinants of Health   Financial Resource Strain: Low Risk    Difficulty of Paying Living  Expenses: Not hard at all  Food Insecurity: No Food Insecurity   Worried About Charity fundraiser in the Last Year: Never true   [place] in the Last Year: Never true  Transportation Needs: No Transportation Needs   Lack of Transportation (Medical): No   Lack of Transportation (Non-Medical): No  Physical Activity: Insufficiently Active   Days of Exercise per Week: 3 days   Minutes of Exercise per Session: 30 min  Stress: No Stress Concern Present   Feeling of Stress : Not at all  Social Connections: Moderately Integrated   Frequency of Communication with Friends and Family: Three times a week   Frequency of Social Gatherings with Friends and Family: Once a week   Attends Religious Services: More than 4 times per year   Active Member of [employer] or Organizations: Yes   Attends Archivist Meetings: Never   Marital Status: Widowed  Human resources officer Violence: Not At Risk   Fear of Current or Ex-Partner: No   Emotionally Abused: No   Physically Abused: No   Sexually Abused: No     Physical Exam   Vitals:   10/02/21 0500 10/02/21 0603  BP: (!) 170/66 (!) 165/70  Pulse: 76 80  Resp: 15 16  Temp:    SpO2: 95% 95%    CONSTITUTIONAL: Chronically ill-appearing, NAD NEURO:  Alert and oriented x 3, no focal deficits EYES:  eyes equal and reactive ENT/NECK:  no LAD, no JVD CARDIO: Regular rate, well-perfused, normal S1 and S2 PULM:  CTAB no wheezing or rhonchi GI/GU:  normal bowel sounds, non-distended, non-tender MSK/SPINE:  No gross deformities, no edema SKIN:  no rash, atraumatic PSYCH:  Appropriate speech and behavior  *Additional and/or pertinent findings included in MDM below  Diagnostic and Interventional Summary    EKG Interpretation  Date/Time:    Ventricular Rate:    PR Interval:    QRS Duration:   QT Interval:    QTC Calculation:   R Axis:     Text Interpretation:         Labs Reviewed  URINALYSIS, ROUTINE W REFLEX MICROSCOPIC - Abnormal;  Notable for the following components:      Result Value   APPearance HAZY (*)    Glucose, UA 150 (*)    Hgb urine dipstick SMALL (*)    Protein, ur 100 (*)    RBC / HPF >50 (*)    Bacteria, UA RARE (*)    All other components within normal limits    No orders to display    Medications - No data to display   Procedures  /  Critical Care Procedures  ED Course and Medical Decision Making  I have reviewed the triage vital signs, the  nursing notes, and pertinent available records from the EMR.  Listed above are laboratory and imaging tests that I personally ordered, reviewed, and interpreted and then considered in my medical decision making (see below for details).  Recurrent catheter issues.  Distention and tenderness to the suprapubic region on exam.  Foley catheter replaced here in the emergency department with drainage of 850 cc of urine.  Patient requested an observation period here in the emergency department to make sure that the catheter continues to function.  Watched for a few hours with continued drainage and patient has been asymptomatic since Foley catheter replacement.  Now appropriate for discharge.       Barth Kirks. [place]onia Small, [place] [email or web address]  Final Clinical Impressions(s) / ED Diagnoses     ICD-10-CM   1. Urinary retention  R33.9     2. Problem with Foley catheter, initial encounter ([place])  [email or web address]       ED Discharge Orders     None        Discharge Instructions Discussed with and Provided to Patient:    Discharge Instructions      You were evaluated in the Emergency Department and after careful evaluation, we did not find any emergent condition requiring admission or further testing in the hospital.  Your exam/testing today was overall reassuring.  We replaced your catheter here in the emergency department with resolution of your symptoms and good urine flow.  Recommend close follow-up with  your regular doctors.  Please return to the Emergency Department if you experience any worsening of your condition.  Thank you for allowing [place] to be a part of your care.        Maudie Flakes, MD 10/02/21 [phone]

## 2021-10-02 NOTE — ED Notes (Signed)
Attempted to call [facility] for report. No response.

## 2021-10-02 NOTE — ED Triage Notes (Signed)
Patient BIB [facility] from [place] home. Was seen last week for urinary retention. Patients catheter is clogged. Abdomen is distended and rigid. Left lower quadrant pain.

## 2021-10-02 NOTE — Discharge Instructions (Signed)
You were evaluated in the Emergency Department and after careful evaluation, we did not find any emergent condition requiring admission or further testing in the hospital.  Your exam/testing today was overall reassuring.  We replaced your catheter here in the emergency department with resolution of your symptoms and good urine flow.  Recommend close follow-up with your regular doctors.  Please return to the Emergency Department if you experience any worsening of your condition.  Thank you for allowing [place] to be a part of your care.

## 2021-10-02 NOTE — ED Notes (Signed)
[facility] called for transport.  

## 2021-10-02 NOTE — ED Notes (Signed)
Bladder scan 29mL

## 2021-10-03 ENCOUNTER — Encounter: Payer: Self-pay | Admitting: Internal Medicine

## 2021-10-03 ENCOUNTER — Ambulatory Visit (INDEPENDENT_AMBULATORY_CARE_PROVIDER_SITE_OTHER): Payer: Medicare Other | Admitting: Internal Medicine

## 2021-10-03 VITALS — BP 140/70 | HR 86 | Ht 68.0 in | Wt 147.0 lb

## 2021-10-03 DIAGNOSIS — Z86010 Personal history of colonic polyps: Secondary | ICD-10-CM | POA: Diagnosis not present

## 2021-10-03 DIAGNOSIS — R197 Diarrhea, unspecified: Secondary | ICD-10-CM | POA: Diagnosis not present

## 2021-10-03 DIAGNOSIS — K5130 Ulcerative (chronic) rectosigmoiditis without complications: Secondary | ICD-10-CM | POA: Diagnosis not present

## 2021-10-03 NOTE — Progress Notes (Signed)
HISTORY OF PRESENT ILLNESS:  Neil [name] is a 80 y.o. 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  He is sent today for post hospital follow-up.  I last saw the patient July 06, 2021 when he presented for routine surveillance colonoscopy due to history of multiple adenomatous colon polyps and a family history of colon cancer.  The examination revealed a diminutive adenoma which was removed.  He was noted to have left and right-sided diverticulosis.  There was segmental colitis in the sigmoid colon in the region of significant diverticular disease.  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  Biopsies were consistent with the same.  He was having no GI complaints at that time.  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  CT imaging at that time revealed mild thickening of the sigmoid colon without other inflammatory features.  The patient was having diarrhea.  Stool testing for multiple pathogens was negative.  He underwent flexible sigmoidoscopy with Dr. Tarri Glenn September 08, 2021.  He was again noted to have changes of colitis in the sigmoid region.  Biopsies of the sigmoid area revealed mild to moderately active chronic nonspecific colitis rectal biopsies revealed more acute inflammation without chronicity he was treated with antibiotics and Row[place] enemas.  Bowel symptoms improved.  He has been back to the hospital several times for issues related to malfunctioning Foley catheter.  Terms of his bowels, he states that he is significantly better.  Currently describes 1 possibly 2 bowel movements per day.  He does describe them as loose with occasional reddish hue.  With very loose stools he had been having incontinence.  No longer.  He has not tried antidiarrheals recently.   He has tried them in the past remotely without any issues.  He denies problems with constipation.  He does take metformin.  Hospital CT and laboratories have been reviewed.  He is currently at [facility].  REVIEW OF SYSTEMS:  All non-GI ROS negative unless otherwise stated in the HPI except for dry mouth, difficulty sleeping, suprapubic discomfort, weakness  Past Medical History:  Diagnosis Date   ALLERGIC RHINITIS 12/04/2007   Arthritis    bilateral hands, shoulders, and knees   BENIGN PROSTATIC HYPERTROPHY 06/05/2007   BUNDLE BRANCH BLOCK, RIGHT 07/21/2009   BURSITIS, RIGHT SHOULDER 11/06/2010   Choledocholithiasis    CKD (chronic kidney disease) stage 3, GFR 30-59 ml/min (HCC) 08/02/2017   COLONIC POLYPS, HX OF 06/05/2007   tubular adenoma also 02/19/2013   Coronary artery calcification seen on CT scan 03/07/2016   DIABETES MELLITUS, TYPE [name] 12/04/2007   pt states not diabetic-no meds 02-05-13 PV   DIVERTICULOSIS, COLON 07/21/2009   ERECTILE DYSFUNCTION 06/05/2007   Gallstones    HYPERLIPIDEMIA 06/05/2007   on meds   HYPERTENSION 06/05/2007   on meds   Hypotension    INGUINAL HERNIA, RIGHT, SMALL 11/06/2010   Liver abscess 05/23/2015   SKIN LESION 06/03/2008    Past Surgical History:  Procedure Laterality Date   BIOPSY  09/08/2021   Procedure: BIOPSY;  Surgeon: Thornton Park, MD;  Location: [facility];  Service: Endoscopy;;   CHOLECYSTECTOMY N/A 03/02/2015   Procedure: LAPAROSCOPIC CHOLECYSTECTOMY;  Surgeon: Fanny Skates, MD;  Location: [facility];  Service: General;  Laterality: N/A;   COLONOSCOPY  2017   JP-MAC-suprep (good)-TA   ERCP N/A 03/01/2015   Procedure: ENDOSCOPIC RETROGRADE CHOLANGIOPANCREATOGRAPHY (ERCP);  Surgeon: Irene Shipper, MD;  Location: [facility];  Service: Endoscopy;  Laterality: N/A;  Dr Dalbert Batman wants patient to stay overnight after ERCP   ERCP N/A 05/03/2015   Procedure: ENDOSCOPIC RETROGRADE CHOLANGIOPANCREATOGRAPHY (ERCP);   Surgeon: Irene Shipper, MD;  Location: [facility];  Service: Endoscopy;  Laterality: N/A;   FINGER SURGERY Left    "little finger"   FLEXIBLE SIGMOIDOSCOPY N/A 09/08/2021   Procedure: FLEXIBLE SIGMOIDOSCOPY;  Surgeon: Thornton Park, MD;  Location: [facility];  Service: Endoscopy;  Laterality: N/A;   [place]   INGUINAL HERNIA REPAIR Right 2014   8'14 repair   POLYPECTOMY     TONSILLECTOMY     WISDOM TOOTH EXTRACTION  1972    Social History Neil [name]  reports that he quit smoking about 14 years ago. His smoking use included cigarettes. He has never used smokeless tobacco. He reports that he does not drink alcohol and does not use drugs.  family history includes Brain cancer (age of onset: 46) in his mother; Colon cancer in his paternal uncle; Colon cancer (age of onset: 55) in his mother; Colon polyps in his paternal uncle; Colon polyps (age of onset: 37) in his mother.  Allergies  Allergen Reactions   Statins Other (See Comments)     muscle aches   Zanaflex [Tizanidine] Other (See Comments)    Dizzy, sleepy   Zetia [Ezetimibe] Other (See Comments)    Statins= Muscle and bone pain       PHYSICAL EXAMINATION: Vital signs: BP 140/70    Pulse 86    Ht _0  (1.727 m)    Wt 147 lb (66.7 kg)    SpO2 98%    BMI 22.35 kg/m   Constitutional: Somewhat frail and chronically ill-appearing, no acute distress.  In a wheelchair.  Yellow urine in catheter container Psychiatric: Pleasant, alert and oriented x3, cooperative Eyes: extraocular movements intact, anicteric, conjunctiva pink Mouth: oral pharynx moist, no lesions Neck: supple no lymphadenopathy Cardiovascular: heart regular rate and rhythm, no murmur Lungs: clear to auscultation bilaterally Abdomen: soft, nontender, nondistended, no obvious ascites, no peritoneal signs, normal bowel sounds, no organomegaly Rectal: Omitted Extremities: no clubbing or cyanosis.  Trace lower extremity edema  bilaterally Skin: no lesions on visible extremities Neuro: No focal deficits.  Cranial nerves intact  ASSESSMENT:  1.  SCAD favored over IBD. 2.  Diarrhea.  Improved.  Still with somewhat loose bowels at times.  1 or 2 bowel movements daily 3.  Multiple medical problems 4.  History of multiple adenomatous polyps.  Surveillance up-to-date  PLAN:  1.  Discussed SCAD 2.  Recommended Imodium.  1 p.o. 3 times daily as needed.  Hold for constipation 3.  Increase fiber 4.  Resume general medical care with PCP 5.  Repeat surveillance colonoscopy not recommended due to age. 6.  GI follow-up as needed A total time of 30 minutes was spent preparing to see the patient, reviewing a myriad of tests and x-rays, reviewing endoscopy reports and biopsies, obtaining comprehensive history, performing medically appropriate physical examination, counseling the patient regarding the above listed issues, ordering medications, and documenting clinical information in the health record

## 2021-10-03 NOTE — Patient Instructions (Signed)
Take Iodium 1 tablet by mouth three times daily as needed for diarrhea.   Follow -up as needed.   If you are age 79 or older, your body mass index should be between 23-30. Your Body mass index is 22.35 kg/m. If this is out of the aforementioned range listed, please consider follow up with your Primary Care Provider.   The [facility] providers would like to encourage you to use [facility] to communicate with providers for non-urgent requests or questions.  Due to long hold times on the telephone, sending your provider a message by [facility] may be a faster and more efficient way to get a response.  Please allow 48 business hours for a response.  Please remember that this is for non-urgent requests.  _______________________________________________________  Thank you for choosing me and [facility].  Dr.John Henrene Pastor

## 2021-10-04 DIAGNOSIS — R531 Weakness: Secondary | ICD-10-CM | POA: Diagnosis not present

## 2021-10-04 DIAGNOSIS — F32A Depression, unspecified: Secondary | ICD-10-CM | POA: Diagnosis not present

## 2021-10-04 DIAGNOSIS — M1990 Unspecified osteoarthritis, unspecified site: Secondary | ICD-10-CM | POA: Diagnosis not present

## 2021-10-07 ENCOUNTER — Other Ambulatory Visit: Payer: Self-pay | Admitting: Internal Medicine

## 2021-10-09 NOTE — Telephone Encounter (Signed)
Please refill as per office routine med refill policy (all routine meds to be refilled for 3 mo or monthly (per pt preference) up to one year from last visit, then month to month grace period for 3 mo, then further med refills will have to be denied) ? ?

## 2021-10-10 ENCOUNTER — Telehealth: Payer: Self-pay

## 2021-10-10 NOTE — Telephone Encounter (Signed)
Pt Daughter calling in requesting a new referral to a [facility]ist.  Please call back with the referral information.

## 2021-10-10 NOTE — Telephone Encounter (Signed)
Please clarify, as the only group in town is [facility].  We can refer to [facility]rology, [facility], or [facility] but there is no [facility]"

## 2021-10-10 NOTE — Telephone Encounter (Signed)
Patient's daughter states that patient went to a urologist and had a bad experience and was requesting to be seen with someone different. Per patient's daughter, will contact [facility] to see if that's where patient was seen and if so, will request for him to be seen by another provider.

## 2021-10-14 DIAGNOSIS — K529 Noninfective gastroenteritis and colitis, unspecified: Secondary | ICD-10-CM | POA: Diagnosis not present

## 2021-10-14 DIAGNOSIS — I2510 Atherosclerotic heart disease of native coronary artery without angina pectoris: Secondary | ICD-10-CM | POA: Diagnosis not present

## 2021-10-14 DIAGNOSIS — N320 Bladder-neck obstruction: Secondary | ICD-10-CM | POA: Diagnosis not present

## 2021-10-14 DIAGNOSIS — I10 Essential (primary) hypertension: Secondary | ICD-10-CM | POA: Diagnosis not present

## 2021-10-14 DIAGNOSIS — R531 Weakness: Secondary | ICD-10-CM | POA: Diagnosis not present

## 2021-10-14 DIAGNOSIS — F32A Depression, unspecified: Secondary | ICD-10-CM | POA: Diagnosis not present

## 2021-10-14 DIAGNOSIS — R339 Retention of urine, unspecified: Secondary | ICD-10-CM | POA: Diagnosis not present

## 2021-10-18 DIAGNOSIS — F32A Depression, unspecified: Secondary | ICD-10-CM | POA: Diagnosis not present

## 2021-10-18 DIAGNOSIS — E119 Type 2 diabetes mellitus without complications: Secondary | ICD-10-CM | POA: Diagnosis not present

## 2021-10-18 DIAGNOSIS — R531 Weakness: Secondary | ICD-10-CM | POA: Diagnosis not present

## 2021-10-18 DIAGNOSIS — M1990 Unspecified osteoarthritis, unspecified site: Secondary | ICD-10-CM | POA: Diagnosis not present

## 2021-10-18 DIAGNOSIS — N320 Bladder-neck obstruction: Secondary | ICD-10-CM | POA: Diagnosis not present

## 2021-10-26 DIAGNOSIS — K529 Noninfective gastroenteritis and colitis, unspecified: Secondary | ICD-10-CM | POA: Diagnosis not present

## 2021-10-26 DIAGNOSIS — F32A Depression, unspecified: Secondary | ICD-10-CM | POA: Diagnosis not present

## 2021-10-26 DIAGNOSIS — I10 Essential (primary) hypertension: Secondary | ICD-10-CM | POA: Diagnosis not present

## 2021-10-26 DIAGNOSIS — N183 Chronic kidney disease, stage 3 unspecified: Secondary | ICD-10-CM | POA: Diagnosis not present

## 2021-10-26 DIAGNOSIS — K922 Gastrointestinal hemorrhage, unspecified: Secondary | ICD-10-CM | POA: Diagnosis not present

## 2021-10-26 DIAGNOSIS — R262 Difficulty in walking, not elsewhere classified: Secondary | ICD-10-CM | POA: Diagnosis not present

## 2021-10-26 DIAGNOSIS — M6281 Muscle weakness (generalized): Secondary | ICD-10-CM | POA: Diagnosis not present

## 2021-10-26 DIAGNOSIS — I779 Disorder of arteries and arterioles, unspecified: Secondary | ICD-10-CM | POA: Diagnosis not present

## 2021-10-26 DIAGNOSIS — E119 Type 2 diabetes mellitus without complications: Secondary | ICD-10-CM | POA: Diagnosis not present

## 2021-10-26 DIAGNOSIS — E43 Unspecified severe protein-calorie malnutrition: Secondary | ICD-10-CM | POA: Diagnosis not present

## 2021-10-26 DIAGNOSIS — R2681 Unsteadiness on feet: Secondary | ICD-10-CM | POA: Diagnosis not present

## 2021-10-26 DIAGNOSIS — N139 Obstructive and reflux uropathy, unspecified: Secondary | ICD-10-CM | POA: Diagnosis not present

## 2021-10-27 DIAGNOSIS — R338 Other retention of urine: Secondary | ICD-10-CM | POA: Diagnosis not present

## 2021-10-27 DIAGNOSIS — R339 Retention of urine, unspecified: Secondary | ICD-10-CM | POA: Diagnosis not present

## 2021-10-27 DIAGNOSIS — E46 Unspecified protein-calorie malnutrition: Secondary | ICD-10-CM | POA: Diagnosis not present

## 2021-10-28 DIAGNOSIS — E038 Other specified hypothyroidism: Secondary | ICD-10-CM | POA: Diagnosis not present

## 2021-10-29 DIAGNOSIS — I10 Essential (primary) hypertension: Secondary | ICD-10-CM | POA: Diagnosis not present

## 2021-10-29 DIAGNOSIS — I2510 Atherosclerotic heart disease of native coronary artery without angina pectoris: Secondary | ICD-10-CM | POA: Diagnosis not present

## 2021-10-29 DIAGNOSIS — R262 Difficulty in walking, not elsewhere classified: Secondary | ICD-10-CM | POA: Diagnosis not present

## 2021-10-29 DIAGNOSIS — F32A Depression, unspecified: Secondary | ICD-10-CM | POA: Diagnosis not present

## 2021-10-29 DIAGNOSIS — E119 Type 2 diabetes mellitus without complications: Secondary | ICD-10-CM | POA: Diagnosis not present

## 2021-10-29 DIAGNOSIS — I779 Disorder of arteries and arterioles, unspecified: Secondary | ICD-10-CM | POA: Diagnosis not present

## 2021-10-29 DIAGNOSIS — N183 Chronic kidney disease, stage 3 unspecified: Secondary | ICD-10-CM | POA: Diagnosis not present

## 2021-10-29 DIAGNOSIS — R2681 Unsteadiness on feet: Secondary | ICD-10-CM | POA: Diagnosis not present

## 2021-10-29 DIAGNOSIS — R339 Retention of urine, unspecified: Secondary | ICD-10-CM | POA: Diagnosis not present

## 2021-10-29 DIAGNOSIS — M6281 Muscle weakness (generalized): Secondary | ICD-10-CM | POA: Diagnosis not present

## 2021-10-29 DIAGNOSIS — K529 Noninfective gastroenteritis and colitis, unspecified: Secondary | ICD-10-CM | POA: Diagnosis not present

## 2021-10-29 DIAGNOSIS — E43 Unspecified severe protein-calorie malnutrition: Secondary | ICD-10-CM | POA: Diagnosis not present

## 2021-10-29 DIAGNOSIS — N139 Obstructive and reflux uropathy, unspecified: Secondary | ICD-10-CM | POA: Diagnosis not present

## 2021-10-29 DIAGNOSIS — K922 Gastrointestinal hemorrhage, unspecified: Secondary | ICD-10-CM | POA: Diagnosis not present

## 2021-10-29 DIAGNOSIS — N320 Bladder-neck obstruction: Secondary | ICD-10-CM | POA: Diagnosis not present

## 2021-10-30 DIAGNOSIS — M6281 Muscle weakness (generalized): Secondary | ICD-10-CM | POA: Diagnosis not present

## 2021-10-30 DIAGNOSIS — N139 Obstructive and reflux uropathy, unspecified: Secondary | ICD-10-CM | POA: Diagnosis not present

## 2021-10-30 DIAGNOSIS — F32A Depression, unspecified: Secondary | ICD-10-CM | POA: Diagnosis not present

## 2021-10-30 DIAGNOSIS — N183 Chronic kidney disease, stage 3 unspecified: Secondary | ICD-10-CM | POA: Diagnosis not present

## 2021-10-30 DIAGNOSIS — R262 Difficulty in walking, not elsewhere classified: Secondary | ICD-10-CM | POA: Diagnosis not present

## 2021-10-30 DIAGNOSIS — I10 Essential (primary) hypertension: Secondary | ICD-10-CM | POA: Diagnosis not present

## 2021-10-30 DIAGNOSIS — K922 Gastrointestinal hemorrhage, unspecified: Secondary | ICD-10-CM | POA: Diagnosis not present

## 2021-10-30 DIAGNOSIS — K529 Noninfective gastroenteritis and colitis, unspecified: Secondary | ICD-10-CM | POA: Diagnosis not present

## 2021-10-30 DIAGNOSIS — R2681 Unsteadiness on feet: Secondary | ICD-10-CM | POA: Diagnosis not present

## 2021-10-30 DIAGNOSIS — I779 Disorder of arteries and arterioles, unspecified: Secondary | ICD-10-CM | POA: Diagnosis not present

## 2021-10-30 DIAGNOSIS — E119 Type 2 diabetes mellitus without complications: Secondary | ICD-10-CM | POA: Diagnosis not present

## 2021-10-30 DIAGNOSIS — E43 Unspecified severe protein-calorie malnutrition: Secondary | ICD-10-CM | POA: Diagnosis not present

## 2021-10-31 DIAGNOSIS — R339 Retention of urine, unspecified: Secondary | ICD-10-CM | POA: Diagnosis not present

## 2021-10-31 DIAGNOSIS — F32A Depression, unspecified: Secondary | ICD-10-CM | POA: Diagnosis not present

## 2021-10-31 DIAGNOSIS — N320 Bladder-neck obstruction: Secondary | ICD-10-CM | POA: Diagnosis not present

## 2021-10-31 DIAGNOSIS — R2681 Unsteadiness on feet: Secondary | ICD-10-CM | POA: Diagnosis not present

## 2021-10-31 DIAGNOSIS — N139 Obstructive and reflux uropathy, unspecified: Secondary | ICD-10-CM | POA: Diagnosis not present

## 2021-10-31 DIAGNOSIS — E119 Type 2 diabetes mellitus without complications: Secondary | ICD-10-CM | POA: Diagnosis not present

## 2021-10-31 DIAGNOSIS — N183 Chronic kidney disease, stage 3 unspecified: Secondary | ICD-10-CM | POA: Diagnosis not present

## 2021-10-31 DIAGNOSIS — M1990 Unspecified osteoarthritis, unspecified site: Secondary | ICD-10-CM | POA: Diagnosis not present

## 2021-10-31 DIAGNOSIS — K922 Gastrointestinal hemorrhage, unspecified: Secondary | ICD-10-CM | POA: Diagnosis not present

## 2021-10-31 DIAGNOSIS — K529 Noninfective gastroenteritis and colitis, unspecified: Secondary | ICD-10-CM | POA: Diagnosis not present

## 2021-10-31 DIAGNOSIS — M6281 Muscle weakness (generalized): Secondary | ICD-10-CM | POA: Diagnosis not present

## 2021-10-31 DIAGNOSIS — R262 Difficulty in walking, not elsewhere classified: Secondary | ICD-10-CM | POA: Diagnosis not present

## 2021-10-31 DIAGNOSIS — E43 Unspecified severe protein-calorie malnutrition: Secondary | ICD-10-CM | POA: Diagnosis not present

## 2021-10-31 DIAGNOSIS — R531 Weakness: Secondary | ICD-10-CM | POA: Diagnosis not present

## 2021-10-31 DIAGNOSIS — I779 Disorder of arteries and arterioles, unspecified: Secondary | ICD-10-CM | POA: Diagnosis not present

## 2021-10-31 DIAGNOSIS — I10 Essential (primary) hypertension: Secondary | ICD-10-CM | POA: Diagnosis not present

## 2021-11-01 DIAGNOSIS — M6281 Muscle weakness (generalized): Secondary | ICD-10-CM | POA: Diagnosis not present

## 2021-11-01 DIAGNOSIS — K922 Gastrointestinal hemorrhage, unspecified: Secondary | ICD-10-CM | POA: Diagnosis not present

## 2021-11-01 DIAGNOSIS — R2681 Unsteadiness on feet: Secondary | ICD-10-CM | POA: Diagnosis not present

## 2021-11-01 DIAGNOSIS — I779 Disorder of arteries and arterioles, unspecified: Secondary | ICD-10-CM | POA: Diagnosis not present

## 2021-11-01 DIAGNOSIS — F32A Depression, unspecified: Secondary | ICD-10-CM | POA: Diagnosis not present

## 2021-11-01 DIAGNOSIS — E119 Type 2 diabetes mellitus without complications: Secondary | ICD-10-CM | POA: Diagnosis not present

## 2021-11-01 DIAGNOSIS — N183 Chronic kidney disease, stage 3 unspecified: Secondary | ICD-10-CM | POA: Diagnosis not present

## 2021-11-01 DIAGNOSIS — N139 Obstructive and reflux uropathy, unspecified: Secondary | ICD-10-CM | POA: Diagnosis not present

## 2021-11-01 DIAGNOSIS — R262 Difficulty in walking, not elsewhere classified: Secondary | ICD-10-CM | POA: Diagnosis not present

## 2021-11-01 DIAGNOSIS — K529 Noninfective gastroenteritis and colitis, unspecified: Secondary | ICD-10-CM | POA: Diagnosis not present

## 2021-11-01 DIAGNOSIS — E43 Unspecified severe protein-calorie malnutrition: Secondary | ICD-10-CM | POA: Diagnosis not present

## 2021-11-01 DIAGNOSIS — I10 Essential (primary) hypertension: Secondary | ICD-10-CM | POA: Diagnosis not present

## 2021-11-02 DIAGNOSIS — R262 Difficulty in walking, not elsewhere classified: Secondary | ICD-10-CM | POA: Diagnosis not present

## 2021-11-02 DIAGNOSIS — K529 Noninfective gastroenteritis and colitis, unspecified: Secondary | ICD-10-CM | POA: Diagnosis not present

## 2021-11-02 DIAGNOSIS — N183 Chronic kidney disease, stage 3 unspecified: Secondary | ICD-10-CM | POA: Diagnosis not present

## 2021-11-02 DIAGNOSIS — F32A Depression, unspecified: Secondary | ICD-10-CM | POA: Diagnosis not present

## 2021-11-02 DIAGNOSIS — E43 Unspecified severe protein-calorie malnutrition: Secondary | ICD-10-CM | POA: Diagnosis not present

## 2021-11-02 DIAGNOSIS — I779 Disorder of arteries and arterioles, unspecified: Secondary | ICD-10-CM | POA: Diagnosis not present

## 2021-11-02 DIAGNOSIS — N139 Obstructive and reflux uropathy, unspecified: Secondary | ICD-10-CM | POA: Diagnosis not present

## 2021-11-02 DIAGNOSIS — R2681 Unsteadiness on feet: Secondary | ICD-10-CM | POA: Diagnosis not present

## 2021-11-02 DIAGNOSIS — K922 Gastrointestinal hemorrhage, unspecified: Secondary | ICD-10-CM | POA: Diagnosis not present

## 2021-11-02 DIAGNOSIS — I10 Essential (primary) hypertension: Secondary | ICD-10-CM | POA: Diagnosis not present

## 2021-11-02 DIAGNOSIS — M6281 Muscle weakness (generalized): Secondary | ICD-10-CM | POA: Diagnosis not present

## 2021-11-02 DIAGNOSIS — E119 Type 2 diabetes mellitus without complications: Secondary | ICD-10-CM | POA: Diagnosis not present

## 2021-11-02 DIAGNOSIS — R338 Other retention of urine: Secondary | ICD-10-CM | POA: Diagnosis not present

## 2021-11-03 DIAGNOSIS — E119 Type 2 diabetes mellitus without complications: Secondary | ICD-10-CM | POA: Diagnosis not present

## 2021-11-03 DIAGNOSIS — F32A Depression, unspecified: Secondary | ICD-10-CM | POA: Diagnosis not present

## 2021-11-03 DIAGNOSIS — E43 Unspecified severe protein-calorie malnutrition: Secondary | ICD-10-CM | POA: Diagnosis not present

## 2021-11-03 DIAGNOSIS — R262 Difficulty in walking, not elsewhere classified: Secondary | ICD-10-CM | POA: Diagnosis not present

## 2021-11-03 DIAGNOSIS — I779 Disorder of arteries and arterioles, unspecified: Secondary | ICD-10-CM | POA: Diagnosis not present

## 2021-11-03 DIAGNOSIS — N139 Obstructive and reflux uropathy, unspecified: Secondary | ICD-10-CM | POA: Diagnosis not present

## 2021-11-03 DIAGNOSIS — R2681 Unsteadiness on feet: Secondary | ICD-10-CM | POA: Diagnosis not present

## 2021-11-03 DIAGNOSIS — N183 Chronic kidney disease, stage 3 unspecified: Secondary | ICD-10-CM | POA: Diagnosis not present

## 2021-11-03 DIAGNOSIS — M6281 Muscle weakness (generalized): Secondary | ICD-10-CM | POA: Diagnosis not present

## 2021-11-03 DIAGNOSIS — K922 Gastrointestinal hemorrhage, unspecified: Secondary | ICD-10-CM | POA: Diagnosis not present

## 2021-11-03 DIAGNOSIS — K529 Noninfective gastroenteritis and colitis, unspecified: Secondary | ICD-10-CM | POA: Diagnosis not present

## 2021-11-03 DIAGNOSIS — I10 Essential (primary) hypertension: Secondary | ICD-10-CM | POA: Diagnosis not present

## 2021-11-04 DIAGNOSIS — I2510 Atherosclerotic heart disease of native coronary artery without angina pectoris: Secondary | ICD-10-CM | POA: Diagnosis not present

## 2021-11-04 DIAGNOSIS — I10 Essential (primary) hypertension: Secondary | ICD-10-CM | POA: Diagnosis not present

## 2021-11-04 DIAGNOSIS — F32A Depression, unspecified: Secondary | ICD-10-CM | POA: Diagnosis not present

## 2021-11-04 DIAGNOSIS — R339 Retention of urine, unspecified: Secondary | ICD-10-CM | POA: Diagnosis not present

## 2021-11-06 DIAGNOSIS — I10 Essential (primary) hypertension: Secondary | ICD-10-CM | POA: Diagnosis not present

## 2021-11-06 DIAGNOSIS — R262 Difficulty in walking, not elsewhere classified: Secondary | ICD-10-CM | POA: Diagnosis not present

## 2021-11-06 DIAGNOSIS — K529 Noninfective gastroenteritis and colitis, unspecified: Secondary | ICD-10-CM | POA: Diagnosis not present

## 2021-11-06 DIAGNOSIS — R2681 Unsteadiness on feet: Secondary | ICD-10-CM | POA: Diagnosis not present

## 2021-11-06 DIAGNOSIS — K922 Gastrointestinal hemorrhage, unspecified: Secondary | ICD-10-CM | POA: Diagnosis not present

## 2021-11-06 DIAGNOSIS — M6281 Muscle weakness (generalized): Secondary | ICD-10-CM | POA: Diagnosis not present

## 2021-11-06 DIAGNOSIS — N139 Obstructive and reflux uropathy, unspecified: Secondary | ICD-10-CM | POA: Diagnosis not present

## 2021-11-06 DIAGNOSIS — I779 Disorder of arteries and arterioles, unspecified: Secondary | ICD-10-CM | POA: Diagnosis not present

## 2021-11-06 DIAGNOSIS — E43 Unspecified severe protein-calorie malnutrition: Secondary | ICD-10-CM | POA: Diagnosis not present

## 2021-11-06 DIAGNOSIS — E119 Type 2 diabetes mellitus without complications: Secondary | ICD-10-CM | POA: Diagnosis not present

## 2021-11-06 DIAGNOSIS — F32A Depression, unspecified: Secondary | ICD-10-CM | POA: Diagnosis not present

## 2021-11-06 DIAGNOSIS — N183 Chronic kidney disease, stage 3 unspecified: Secondary | ICD-10-CM | POA: Diagnosis not present

## 2021-11-07 DIAGNOSIS — R2681 Unsteadiness on feet: Secondary | ICD-10-CM | POA: Diagnosis not present

## 2021-11-07 DIAGNOSIS — K529 Noninfective gastroenteritis and colitis, unspecified: Secondary | ICD-10-CM | POA: Diagnosis not present

## 2021-11-07 DIAGNOSIS — R262 Difficulty in walking, not elsewhere classified: Secondary | ICD-10-CM | POA: Diagnosis not present

## 2021-11-07 DIAGNOSIS — N139 Obstructive and reflux uropathy, unspecified: Secondary | ICD-10-CM | POA: Diagnosis not present

## 2021-11-07 DIAGNOSIS — I779 Disorder of arteries and arterioles, unspecified: Secondary | ICD-10-CM | POA: Diagnosis not present

## 2021-11-07 DIAGNOSIS — N183 Chronic kidney disease, stage 3 unspecified: Secondary | ICD-10-CM | POA: Diagnosis not present

## 2021-11-07 DIAGNOSIS — K922 Gastrointestinal hemorrhage, unspecified: Secondary | ICD-10-CM | POA: Diagnosis not present

## 2021-11-07 DIAGNOSIS — E43 Unspecified severe protein-calorie malnutrition: Secondary | ICD-10-CM | POA: Diagnosis not present

## 2021-11-07 DIAGNOSIS — F32A Depression, unspecified: Secondary | ICD-10-CM | POA: Diagnosis not present

## 2021-11-07 DIAGNOSIS — I10 Essential (primary) hypertension: Secondary | ICD-10-CM | POA: Diagnosis not present

## 2021-11-07 DIAGNOSIS — E119 Type 2 diabetes mellitus without complications: Secondary | ICD-10-CM | POA: Diagnosis not present

## 2021-11-07 DIAGNOSIS — M6281 Muscle weakness (generalized): Secondary | ICD-10-CM | POA: Diagnosis not present

## 2021-11-08 DIAGNOSIS — K922 Gastrointestinal hemorrhage, unspecified: Secondary | ICD-10-CM | POA: Diagnosis not present

## 2021-11-08 DIAGNOSIS — E43 Unspecified severe protein-calorie malnutrition: Secondary | ICD-10-CM | POA: Diagnosis not present

## 2021-11-08 DIAGNOSIS — E119 Type 2 diabetes mellitus without complications: Secondary | ICD-10-CM | POA: Diagnosis not present

## 2021-11-08 DIAGNOSIS — N139 Obstructive and reflux uropathy, unspecified: Secondary | ICD-10-CM | POA: Diagnosis not present

## 2021-11-08 DIAGNOSIS — N183 Chronic kidney disease, stage 3 unspecified: Secondary | ICD-10-CM | POA: Diagnosis not present

## 2021-11-08 DIAGNOSIS — F32A Depression, unspecified: Secondary | ICD-10-CM | POA: Diagnosis not present

## 2021-11-08 DIAGNOSIS — R262 Difficulty in walking, not elsewhere classified: Secondary | ICD-10-CM | POA: Diagnosis not present

## 2021-11-08 DIAGNOSIS — R2681 Unsteadiness on feet: Secondary | ICD-10-CM | POA: Diagnosis not present

## 2021-11-08 DIAGNOSIS — K529 Noninfective gastroenteritis and colitis, unspecified: Secondary | ICD-10-CM | POA: Diagnosis not present

## 2021-11-08 DIAGNOSIS — I779 Disorder of arteries and arterioles, unspecified: Secondary | ICD-10-CM | POA: Diagnosis not present

## 2021-11-08 DIAGNOSIS — M6281 Muscle weakness (generalized): Secondary | ICD-10-CM | POA: Diagnosis not present

## 2021-11-08 DIAGNOSIS — I10 Essential (primary) hypertension: Secondary | ICD-10-CM | POA: Diagnosis not present

## 2021-11-09 DIAGNOSIS — R2681 Unsteadiness on feet: Secondary | ICD-10-CM | POA: Diagnosis not present

## 2021-11-09 DIAGNOSIS — K529 Noninfective gastroenteritis and colitis, unspecified: Secondary | ICD-10-CM | POA: Diagnosis not present

## 2021-11-09 DIAGNOSIS — I779 Disorder of arteries and arterioles, unspecified: Secondary | ICD-10-CM | POA: Diagnosis not present

## 2021-11-09 DIAGNOSIS — N139 Obstructive and reflux uropathy, unspecified: Secondary | ICD-10-CM | POA: Diagnosis not present

## 2021-11-09 DIAGNOSIS — N183 Chronic kidney disease, stage 3 unspecified: Secondary | ICD-10-CM | POA: Diagnosis not present

## 2021-11-09 DIAGNOSIS — E43 Unspecified severe protein-calorie malnutrition: Secondary | ICD-10-CM | POA: Diagnosis not present

## 2021-11-09 DIAGNOSIS — I10 Essential (primary) hypertension: Secondary | ICD-10-CM | POA: Diagnosis not present

## 2021-11-09 DIAGNOSIS — E119 Type 2 diabetes mellitus without complications: Secondary | ICD-10-CM | POA: Diagnosis not present

## 2021-11-09 DIAGNOSIS — K922 Gastrointestinal hemorrhage, unspecified: Secondary | ICD-10-CM | POA: Diagnosis not present

## 2021-11-09 DIAGNOSIS — R262 Difficulty in walking, not elsewhere classified: Secondary | ICD-10-CM | POA: Diagnosis not present

## 2021-11-09 DIAGNOSIS — M6281 Muscle weakness (generalized): Secondary | ICD-10-CM | POA: Diagnosis not present

## 2021-11-09 DIAGNOSIS — F32A Depression, unspecified: Secondary | ICD-10-CM | POA: Diagnosis not present

## 2021-11-10 DIAGNOSIS — K529 Noninfective gastroenteritis and colitis, unspecified: Secondary | ICD-10-CM | POA: Diagnosis not present

## 2021-11-10 DIAGNOSIS — R339 Retention of urine, unspecified: Secondary | ICD-10-CM | POA: Diagnosis not present

## 2021-11-10 DIAGNOSIS — E43 Unspecified severe protein-calorie malnutrition: Secondary | ICD-10-CM | POA: Diagnosis not present

## 2021-11-10 DIAGNOSIS — R2681 Unsteadiness on feet: Secondary | ICD-10-CM | POA: Diagnosis not present

## 2021-11-10 DIAGNOSIS — N320 Bladder-neck obstruction: Secondary | ICD-10-CM | POA: Diagnosis not present

## 2021-11-10 DIAGNOSIS — G4700 Insomnia, unspecified: Secondary | ICD-10-CM | POA: Diagnosis not present

## 2021-11-10 DIAGNOSIS — I779 Disorder of arteries and arterioles, unspecified: Secondary | ICD-10-CM | POA: Diagnosis not present

## 2021-11-10 DIAGNOSIS — F32A Depression, unspecified: Secondary | ICD-10-CM | POA: Diagnosis not present

## 2021-11-10 DIAGNOSIS — N183 Chronic kidney disease, stage 3 unspecified: Secondary | ICD-10-CM | POA: Diagnosis not present

## 2021-11-10 DIAGNOSIS — K922 Gastrointestinal hemorrhage, unspecified: Secondary | ICD-10-CM | POA: Diagnosis not present

## 2021-11-10 DIAGNOSIS — E119 Type 2 diabetes mellitus without complications: Secondary | ICD-10-CM | POA: Diagnosis not present

## 2021-11-10 DIAGNOSIS — N139 Obstructive and reflux uropathy, unspecified: Secondary | ICD-10-CM | POA: Diagnosis not present

## 2021-11-10 DIAGNOSIS — R262 Difficulty in walking, not elsewhere classified: Secondary | ICD-10-CM | POA: Diagnosis not present

## 2021-11-10 DIAGNOSIS — I10 Essential (primary) hypertension: Secondary | ICD-10-CM | POA: Diagnosis not present

## 2021-11-10 DIAGNOSIS — M6281 Muscle weakness (generalized): Secondary | ICD-10-CM | POA: Diagnosis not present

## 2021-11-11 DIAGNOSIS — I779 Disorder of arteries and arterioles, unspecified: Secondary | ICD-10-CM | POA: Diagnosis not present

## 2021-11-11 DIAGNOSIS — K529 Noninfective gastroenteritis and colitis, unspecified: Secondary | ICD-10-CM | POA: Diagnosis not present

## 2021-11-11 DIAGNOSIS — R262 Difficulty in walking, not elsewhere classified: Secondary | ICD-10-CM | POA: Diagnosis not present

## 2021-11-11 DIAGNOSIS — E43 Unspecified severe protein-calorie malnutrition: Secondary | ICD-10-CM | POA: Diagnosis not present

## 2021-11-11 DIAGNOSIS — N139 Obstructive and reflux uropathy, unspecified: Secondary | ICD-10-CM | POA: Diagnosis not present

## 2021-11-11 DIAGNOSIS — M6281 Muscle weakness (generalized): Secondary | ICD-10-CM | POA: Diagnosis not present

## 2021-11-11 DIAGNOSIS — N183 Chronic kidney disease, stage 3 unspecified: Secondary | ICD-10-CM | POA: Diagnosis not present

## 2021-11-11 DIAGNOSIS — K922 Gastrointestinal hemorrhage, unspecified: Secondary | ICD-10-CM | POA: Diagnosis not present

## 2021-11-11 DIAGNOSIS — R2681 Unsteadiness on feet: Secondary | ICD-10-CM | POA: Diagnosis not present

## 2021-11-11 DIAGNOSIS — E119 Type 2 diabetes mellitus without complications: Secondary | ICD-10-CM | POA: Diagnosis not present

## 2021-11-11 DIAGNOSIS — I10 Essential (primary) hypertension: Secondary | ICD-10-CM | POA: Diagnosis not present

## 2021-11-11 DIAGNOSIS — F32A Depression, unspecified: Secondary | ICD-10-CM | POA: Diagnosis not present

## 2021-11-13 DIAGNOSIS — I779 Disorder of arteries and arterioles, unspecified: Secondary | ICD-10-CM | POA: Diagnosis not present

## 2021-11-13 DIAGNOSIS — R2681 Unsteadiness on feet: Secondary | ICD-10-CM | POA: Diagnosis not present

## 2021-11-13 DIAGNOSIS — K529 Noninfective gastroenteritis and colitis, unspecified: Secondary | ICD-10-CM | POA: Diagnosis not present

## 2021-11-13 DIAGNOSIS — E43 Unspecified severe protein-calorie malnutrition: Secondary | ICD-10-CM | POA: Diagnosis not present

## 2021-11-13 DIAGNOSIS — I10 Essential (primary) hypertension: Secondary | ICD-10-CM | POA: Diagnosis not present

## 2021-11-13 DIAGNOSIS — N139 Obstructive and reflux uropathy, unspecified: Secondary | ICD-10-CM | POA: Diagnosis not present

## 2021-11-13 DIAGNOSIS — K922 Gastrointestinal hemorrhage, unspecified: Secondary | ICD-10-CM | POA: Diagnosis not present

## 2021-11-13 DIAGNOSIS — N183 Chronic kidney disease, stage 3 unspecified: Secondary | ICD-10-CM | POA: Diagnosis not present

## 2021-11-13 DIAGNOSIS — F32A Depression, unspecified: Secondary | ICD-10-CM | POA: Diagnosis not present

## 2021-11-13 DIAGNOSIS — E119 Type 2 diabetes mellitus without complications: Secondary | ICD-10-CM | POA: Diagnosis not present

## 2021-11-13 DIAGNOSIS — M6281 Muscle weakness (generalized): Secondary | ICD-10-CM | POA: Diagnosis not present

## 2021-11-13 DIAGNOSIS — R262 Difficulty in walking, not elsewhere classified: Secondary | ICD-10-CM | POA: Diagnosis not present

## 2021-11-14 DIAGNOSIS — N183 Chronic kidney disease, stage 3 unspecified: Secondary | ICD-10-CM | POA: Diagnosis not present

## 2021-11-14 DIAGNOSIS — K529 Noninfective gastroenteritis and colitis, unspecified: Secondary | ICD-10-CM | POA: Diagnosis not present

## 2021-11-14 DIAGNOSIS — E43 Unspecified severe protein-calorie malnutrition: Secondary | ICD-10-CM | POA: Diagnosis not present

## 2021-11-14 DIAGNOSIS — F32A Depression, unspecified: Secondary | ICD-10-CM | POA: Diagnosis not present

## 2021-11-14 DIAGNOSIS — E119 Type 2 diabetes mellitus without complications: Secondary | ICD-10-CM | POA: Diagnosis not present

## 2021-11-14 DIAGNOSIS — R2681 Unsteadiness on feet: Secondary | ICD-10-CM | POA: Diagnosis not present

## 2021-11-14 DIAGNOSIS — M6281 Muscle weakness (generalized): Secondary | ICD-10-CM | POA: Diagnosis not present

## 2021-11-14 DIAGNOSIS — I779 Disorder of arteries and arterioles, unspecified: Secondary | ICD-10-CM | POA: Diagnosis not present

## 2021-11-14 DIAGNOSIS — K922 Gastrointestinal hemorrhage, unspecified: Secondary | ICD-10-CM | POA: Diagnosis not present

## 2021-11-14 DIAGNOSIS — R262 Difficulty in walking, not elsewhere classified: Secondary | ICD-10-CM | POA: Diagnosis not present

## 2021-11-14 DIAGNOSIS — I10 Essential (primary) hypertension: Secondary | ICD-10-CM | POA: Diagnosis not present

## 2021-11-14 DIAGNOSIS — N139 Obstructive and reflux uropathy, unspecified: Secondary | ICD-10-CM | POA: Diagnosis not present

## 2021-11-15 DIAGNOSIS — F32A Depression, unspecified: Secondary | ICD-10-CM | POA: Diagnosis not present

## 2021-11-15 DIAGNOSIS — K529 Noninfective gastroenteritis and colitis, unspecified: Secondary | ICD-10-CM | POA: Diagnosis not present

## 2021-11-15 DIAGNOSIS — R262 Difficulty in walking, not elsewhere classified: Secondary | ICD-10-CM | POA: Diagnosis not present

## 2021-11-15 DIAGNOSIS — N139 Obstructive and reflux uropathy, unspecified: Secondary | ICD-10-CM | POA: Diagnosis not present

## 2021-11-15 DIAGNOSIS — K922 Gastrointestinal hemorrhage, unspecified: Secondary | ICD-10-CM | POA: Diagnosis not present

## 2021-11-15 DIAGNOSIS — M6281 Muscle weakness (generalized): Secondary | ICD-10-CM | POA: Diagnosis not present

## 2021-11-15 DIAGNOSIS — E43 Unspecified severe protein-calorie malnutrition: Secondary | ICD-10-CM | POA: Diagnosis not present

## 2021-11-15 DIAGNOSIS — I10 Essential (primary) hypertension: Secondary | ICD-10-CM | POA: Diagnosis not present

## 2021-11-15 DIAGNOSIS — N183 Chronic kidney disease, stage 3 unspecified: Secondary | ICD-10-CM | POA: Diagnosis not present

## 2021-11-15 DIAGNOSIS — I779 Disorder of arteries and arterioles, unspecified: Secondary | ICD-10-CM | POA: Diagnosis not present

## 2021-11-15 DIAGNOSIS — E119 Type 2 diabetes mellitus without complications: Secondary | ICD-10-CM | POA: Diagnosis not present

## 2021-11-15 DIAGNOSIS — R2681 Unsteadiness on feet: Secondary | ICD-10-CM | POA: Diagnosis not present

## 2021-11-17 DIAGNOSIS — F32A Depression, unspecified: Secondary | ICD-10-CM | POA: Diagnosis not present

## 2021-11-17 DIAGNOSIS — K529 Noninfective gastroenteritis and colitis, unspecified: Secondary | ICD-10-CM | POA: Diagnosis not present

## 2021-11-17 DIAGNOSIS — K922 Gastrointestinal hemorrhage, unspecified: Secondary | ICD-10-CM | POA: Diagnosis not present

## 2021-11-17 DIAGNOSIS — N183 Chronic kidney disease, stage 3 unspecified: Secondary | ICD-10-CM | POA: Diagnosis not present

## 2021-11-17 DIAGNOSIS — N139 Obstructive and reflux uropathy, unspecified: Secondary | ICD-10-CM | POA: Diagnosis not present

## 2021-11-17 DIAGNOSIS — E119 Type 2 diabetes mellitus without complications: Secondary | ICD-10-CM | POA: Diagnosis not present

## 2021-11-17 DIAGNOSIS — I779 Disorder of arteries and arterioles, unspecified: Secondary | ICD-10-CM | POA: Diagnosis not present

## 2021-11-17 DIAGNOSIS — R2681 Unsteadiness on feet: Secondary | ICD-10-CM | POA: Diagnosis not present

## 2021-11-17 DIAGNOSIS — R262 Difficulty in walking, not elsewhere classified: Secondary | ICD-10-CM | POA: Diagnosis not present

## 2021-11-17 DIAGNOSIS — I10 Essential (primary) hypertension: Secondary | ICD-10-CM | POA: Diagnosis not present

## 2021-11-17 DIAGNOSIS — E43 Unspecified severe protein-calorie malnutrition: Secondary | ICD-10-CM | POA: Diagnosis not present

## 2021-11-17 DIAGNOSIS — M6281 Muscle weakness (generalized): Secondary | ICD-10-CM | POA: Diagnosis not present

## 2021-11-18 DIAGNOSIS — K529 Noninfective gastroenteritis and colitis, unspecified: Secondary | ICD-10-CM | POA: Diagnosis not present

## 2021-11-18 DIAGNOSIS — F32A Depression, unspecified: Secondary | ICD-10-CM | POA: Diagnosis not present

## 2021-11-18 DIAGNOSIS — N183 Chronic kidney disease, stage 3 unspecified: Secondary | ICD-10-CM | POA: Diagnosis not present

## 2021-11-18 DIAGNOSIS — R2681 Unsteadiness on feet: Secondary | ICD-10-CM | POA: Diagnosis not present

## 2021-11-18 DIAGNOSIS — I779 Disorder of arteries and arterioles, unspecified: Secondary | ICD-10-CM | POA: Diagnosis not present

## 2021-11-18 DIAGNOSIS — R262 Difficulty in walking, not elsewhere classified: Secondary | ICD-10-CM | POA: Diagnosis not present

## 2021-11-18 DIAGNOSIS — M6281 Muscle weakness (generalized): Secondary | ICD-10-CM | POA: Diagnosis not present

## 2021-11-18 DIAGNOSIS — I10 Essential (primary) hypertension: Secondary | ICD-10-CM | POA: Diagnosis not present

## 2021-11-18 DIAGNOSIS — E43 Unspecified severe protein-calorie malnutrition: Secondary | ICD-10-CM | POA: Diagnosis not present

## 2021-11-18 DIAGNOSIS — E119 Type 2 diabetes mellitus without complications: Secondary | ICD-10-CM | POA: Diagnosis not present

## 2021-11-18 DIAGNOSIS — N139 Obstructive and reflux uropathy, unspecified: Secondary | ICD-10-CM | POA: Diagnosis not present

## 2021-11-18 DIAGNOSIS — K922 Gastrointestinal hemorrhage, unspecified: Secondary | ICD-10-CM | POA: Diagnosis not present

## 2021-11-20 DIAGNOSIS — R339 Retention of urine, unspecified: Secondary | ICD-10-CM | POA: Diagnosis not present

## 2021-11-20 DIAGNOSIS — R2681 Unsteadiness on feet: Secondary | ICD-10-CM | POA: Diagnosis not present

## 2021-11-20 DIAGNOSIS — N183 Chronic kidney disease, stage 3 unspecified: Secondary | ICD-10-CM | POA: Diagnosis not present

## 2021-11-20 DIAGNOSIS — E43 Unspecified severe protein-calorie malnutrition: Secondary | ICD-10-CM | POA: Diagnosis not present

## 2021-11-20 DIAGNOSIS — I10 Essential (primary) hypertension: Secondary | ICD-10-CM | POA: Diagnosis not present

## 2021-11-20 DIAGNOSIS — F32A Depression, unspecified: Secondary | ICD-10-CM | POA: Diagnosis not present

## 2021-11-20 DIAGNOSIS — E119 Type 2 diabetes mellitus without complications: Secondary | ICD-10-CM | POA: Diagnosis not present

## 2021-11-20 DIAGNOSIS — N139 Obstructive and reflux uropathy, unspecified: Secondary | ICD-10-CM | POA: Diagnosis not present

## 2021-11-20 DIAGNOSIS — K529 Noninfective gastroenteritis and colitis, unspecified: Secondary | ICD-10-CM | POA: Diagnosis not present

## 2021-11-20 DIAGNOSIS — M6281 Muscle weakness (generalized): Secondary | ICD-10-CM | POA: Diagnosis not present

## 2021-11-20 DIAGNOSIS — I779 Disorder of arteries and arterioles, unspecified: Secondary | ICD-10-CM | POA: Diagnosis not present

## 2021-11-20 DIAGNOSIS — R531 Weakness: Secondary | ICD-10-CM | POA: Diagnosis not present

## 2021-11-20 DIAGNOSIS — K922 Gastrointestinal hemorrhage, unspecified: Secondary | ICD-10-CM | POA: Diagnosis not present

## 2021-11-20 DIAGNOSIS — R262 Difficulty in walking, not elsewhere classified: Secondary | ICD-10-CM | POA: Diagnosis not present

## 2021-11-21 DIAGNOSIS — M6281 Muscle weakness (generalized): Secondary | ICD-10-CM | POA: Diagnosis not present

## 2021-11-21 DIAGNOSIS — E119 Type 2 diabetes mellitus without complications: Secondary | ICD-10-CM | POA: Diagnosis not present

## 2021-11-21 DIAGNOSIS — R2681 Unsteadiness on feet: Secondary | ICD-10-CM | POA: Diagnosis not present

## 2021-11-21 DIAGNOSIS — K922 Gastrointestinal hemorrhage, unspecified: Secondary | ICD-10-CM | POA: Diagnosis not present

## 2021-11-21 DIAGNOSIS — N183 Chronic kidney disease, stage 3 unspecified: Secondary | ICD-10-CM | POA: Diagnosis not present

## 2021-11-21 DIAGNOSIS — E43 Unspecified severe protein-calorie malnutrition: Secondary | ICD-10-CM | POA: Diagnosis not present

## 2021-11-21 DIAGNOSIS — R262 Difficulty in walking, not elsewhere classified: Secondary | ICD-10-CM | POA: Diagnosis not present

## 2021-11-21 DIAGNOSIS — N139 Obstructive and reflux uropathy, unspecified: Secondary | ICD-10-CM | POA: Diagnosis not present

## 2021-11-21 DIAGNOSIS — I10 Essential (primary) hypertension: Secondary | ICD-10-CM | POA: Diagnosis not present

## 2021-11-21 DIAGNOSIS — K529 Noninfective gastroenteritis and colitis, unspecified: Secondary | ICD-10-CM | POA: Diagnosis not present

## 2021-11-21 DIAGNOSIS — F32A Depression, unspecified: Secondary | ICD-10-CM | POA: Diagnosis not present

## 2021-11-21 DIAGNOSIS — I779 Disorder of arteries and arterioles, unspecified: Secondary | ICD-10-CM | POA: Diagnosis not present

## 2021-11-22 DIAGNOSIS — K529 Noninfective gastroenteritis and colitis, unspecified: Secondary | ICD-10-CM | POA: Diagnosis not present

## 2021-11-22 DIAGNOSIS — I779 Disorder of arteries and arterioles, unspecified: Secondary | ICD-10-CM | POA: Diagnosis not present

## 2021-11-22 DIAGNOSIS — R2681 Unsteadiness on feet: Secondary | ICD-10-CM | POA: Diagnosis not present

## 2021-11-22 DIAGNOSIS — N139 Obstructive and reflux uropathy, unspecified: Secondary | ICD-10-CM | POA: Diagnosis not present

## 2021-11-22 DIAGNOSIS — I10 Essential (primary) hypertension: Secondary | ICD-10-CM | POA: Diagnosis not present

## 2021-11-22 DIAGNOSIS — N183 Chronic kidney disease, stage 3 unspecified: Secondary | ICD-10-CM | POA: Diagnosis not present

## 2021-11-22 DIAGNOSIS — F32A Depression, unspecified: Secondary | ICD-10-CM | POA: Diagnosis not present

## 2021-11-22 DIAGNOSIS — M6281 Muscle weakness (generalized): Secondary | ICD-10-CM | POA: Diagnosis not present

## 2021-11-22 DIAGNOSIS — K922 Gastrointestinal hemorrhage, unspecified: Secondary | ICD-10-CM | POA: Diagnosis not present

## 2021-11-22 DIAGNOSIS — E119 Type 2 diabetes mellitus without complications: Secondary | ICD-10-CM | POA: Diagnosis not present

## 2021-11-22 DIAGNOSIS — E43 Unspecified severe protein-calorie malnutrition: Secondary | ICD-10-CM | POA: Diagnosis not present

## 2021-11-22 DIAGNOSIS — R262 Difficulty in walking, not elsewhere classified: Secondary | ICD-10-CM | POA: Diagnosis not present

## 2021-11-23 DIAGNOSIS — N139 Obstructive and reflux uropathy, unspecified: Secondary | ICD-10-CM | POA: Diagnosis not present

## 2021-11-23 DIAGNOSIS — I779 Disorder of arteries and arterioles, unspecified: Secondary | ICD-10-CM | POA: Diagnosis not present

## 2021-11-23 DIAGNOSIS — R262 Difficulty in walking, not elsewhere classified: Secondary | ICD-10-CM | POA: Diagnosis not present

## 2021-11-23 DIAGNOSIS — M6281 Muscle weakness (generalized): Secondary | ICD-10-CM | POA: Diagnosis not present

## 2021-11-23 DIAGNOSIS — F32A Depression, unspecified: Secondary | ICD-10-CM | POA: Diagnosis not present

## 2021-11-23 DIAGNOSIS — K922 Gastrointestinal hemorrhage, unspecified: Secondary | ICD-10-CM | POA: Diagnosis not present

## 2021-11-23 DIAGNOSIS — N183 Chronic kidney disease, stage 3 unspecified: Secondary | ICD-10-CM | POA: Diagnosis not present

## 2021-11-23 DIAGNOSIS — R2681 Unsteadiness on feet: Secondary | ICD-10-CM | POA: Diagnosis not present

## 2021-11-23 DIAGNOSIS — E119 Type 2 diabetes mellitus without complications: Secondary | ICD-10-CM | POA: Diagnosis not present

## 2021-11-23 DIAGNOSIS — K529 Noninfective gastroenteritis and colitis, unspecified: Secondary | ICD-10-CM | POA: Diagnosis not present

## 2021-11-23 DIAGNOSIS — E43 Unspecified severe protein-calorie malnutrition: Secondary | ICD-10-CM | POA: Diagnosis not present

## 2021-11-23 DIAGNOSIS — I10 Essential (primary) hypertension: Secondary | ICD-10-CM | POA: Diagnosis not present

## 2021-11-24 DIAGNOSIS — R2681 Unsteadiness on feet: Secondary | ICD-10-CM | POA: Diagnosis not present

## 2021-11-24 DIAGNOSIS — R262 Difficulty in walking, not elsewhere classified: Secondary | ICD-10-CM | POA: Diagnosis not present

## 2021-11-24 DIAGNOSIS — I779 Disorder of arteries and arterioles, unspecified: Secondary | ICD-10-CM | POA: Diagnosis not present

## 2021-11-24 DIAGNOSIS — N183 Chronic kidney disease, stage 3 unspecified: Secondary | ICD-10-CM | POA: Diagnosis not present

## 2021-11-24 DIAGNOSIS — K922 Gastrointestinal hemorrhage, unspecified: Secondary | ICD-10-CM | POA: Diagnosis not present

## 2021-11-24 DIAGNOSIS — K529 Noninfective gastroenteritis and colitis, unspecified: Secondary | ICD-10-CM | POA: Diagnosis not present

## 2021-11-24 DIAGNOSIS — E43 Unspecified severe protein-calorie malnutrition: Secondary | ICD-10-CM | POA: Diagnosis not present

## 2021-11-24 DIAGNOSIS — N139 Obstructive and reflux uropathy, unspecified: Secondary | ICD-10-CM | POA: Diagnosis not present

## 2021-11-24 DIAGNOSIS — M6281 Muscle weakness (generalized): Secondary | ICD-10-CM | POA: Diagnosis not present

## 2021-11-24 DIAGNOSIS — E119 Type 2 diabetes mellitus without complications: Secondary | ICD-10-CM | POA: Diagnosis not present

## 2021-11-24 DIAGNOSIS — I10 Essential (primary) hypertension: Secondary | ICD-10-CM | POA: Diagnosis not present

## 2021-11-24 DIAGNOSIS — F32A Depression, unspecified: Secondary | ICD-10-CM | POA: Diagnosis not present

## 2021-11-27 ENCOUNTER — Ambulatory Visit: Payer: Medicare Other | Admitting: Internal Medicine

## 2021-11-27 DIAGNOSIS — I10 Essential (primary) hypertension: Secondary | ICD-10-CM | POA: Diagnosis not present

## 2021-11-27 DIAGNOSIS — I779 Disorder of arteries and arterioles, unspecified: Secondary | ICD-10-CM | POA: Diagnosis not present

## 2021-11-27 DIAGNOSIS — K922 Gastrointestinal hemorrhage, unspecified: Secondary | ICD-10-CM | POA: Diagnosis not present

## 2021-11-27 DIAGNOSIS — N183 Chronic kidney disease, stage 3 unspecified: Secondary | ICD-10-CM | POA: Diagnosis not present

## 2021-11-27 DIAGNOSIS — M6281 Muscle weakness (generalized): Secondary | ICD-10-CM | POA: Diagnosis not present

## 2021-11-27 DIAGNOSIS — K529 Noninfective gastroenteritis and colitis, unspecified: Secondary | ICD-10-CM | POA: Diagnosis not present

## 2021-11-27 DIAGNOSIS — R531 Weakness: Secondary | ICD-10-CM | POA: Diagnosis not present

## 2021-11-27 DIAGNOSIS — M1990 Unspecified osteoarthritis, unspecified site: Secondary | ICD-10-CM | POA: Diagnosis not present

## 2021-11-27 DIAGNOSIS — R2681 Unsteadiness on feet: Secondary | ICD-10-CM | POA: Diagnosis not present

## 2021-11-27 DIAGNOSIS — E43 Unspecified severe protein-calorie malnutrition: Secondary | ICD-10-CM | POA: Diagnosis not present

## 2021-11-27 DIAGNOSIS — R262 Difficulty in walking, not elsewhere classified: Secondary | ICD-10-CM | POA: Diagnosis not present

## 2021-11-27 DIAGNOSIS — N139 Obstructive and reflux uropathy, unspecified: Secondary | ICD-10-CM | POA: Diagnosis not present

## 2021-11-27 DIAGNOSIS — N320 Bladder-neck obstruction: Secondary | ICD-10-CM | POA: Diagnosis not present

## 2021-11-27 DIAGNOSIS — E119 Type 2 diabetes mellitus without complications: Secondary | ICD-10-CM | POA: Diagnosis not present

## 2021-11-27 DIAGNOSIS — R339 Retention of urine, unspecified: Secondary | ICD-10-CM | POA: Diagnosis not present

## 2021-11-27 DIAGNOSIS — F32A Depression, unspecified: Secondary | ICD-10-CM | POA: Diagnosis not present

## 2021-11-28 DIAGNOSIS — I10 Essential (primary) hypertension: Secondary | ICD-10-CM | POA: Diagnosis not present

## 2021-11-28 DIAGNOSIS — F32A Depression, unspecified: Secondary | ICD-10-CM | POA: Diagnosis not present

## 2021-11-28 DIAGNOSIS — M6281 Muscle weakness (generalized): Secondary | ICD-10-CM | POA: Diagnosis not present

## 2021-11-28 DIAGNOSIS — I779 Disorder of arteries and arterioles, unspecified: Secondary | ICD-10-CM | POA: Diagnosis not present

## 2021-11-28 DIAGNOSIS — E119 Type 2 diabetes mellitus without complications: Secondary | ICD-10-CM | POA: Diagnosis not present

## 2021-11-28 DIAGNOSIS — K922 Gastrointestinal hemorrhage, unspecified: Secondary | ICD-10-CM | POA: Diagnosis not present

## 2021-11-28 DIAGNOSIS — E43 Unspecified severe protein-calorie malnutrition: Secondary | ICD-10-CM | POA: Diagnosis not present

## 2021-11-28 DIAGNOSIS — K529 Noninfective gastroenteritis and colitis, unspecified: Secondary | ICD-10-CM | POA: Diagnosis not present

## 2021-11-28 DIAGNOSIS — R262 Difficulty in walking, not elsewhere classified: Secondary | ICD-10-CM | POA: Diagnosis not present

## 2021-11-28 DIAGNOSIS — N183 Chronic kidney disease, stage 3 unspecified: Secondary | ICD-10-CM | POA: Diagnosis not present

## 2021-11-28 DIAGNOSIS — R2681 Unsteadiness on feet: Secondary | ICD-10-CM | POA: Diagnosis not present

## 2021-11-28 DIAGNOSIS — N139 Obstructive and reflux uropathy, unspecified: Secondary | ICD-10-CM | POA: Diagnosis not present

## 2021-12-01 DIAGNOSIS — R262 Difficulty in walking, not elsewhere classified: Secondary | ICD-10-CM | POA: Diagnosis not present

## 2021-12-01 DIAGNOSIS — N139 Obstructive and reflux uropathy, unspecified: Secondary | ICD-10-CM | POA: Diagnosis not present

## 2021-12-01 DIAGNOSIS — K529 Noninfective gastroenteritis and colitis, unspecified: Secondary | ICD-10-CM | POA: Diagnosis not present

## 2021-12-01 DIAGNOSIS — N183 Chronic kidney disease, stage 3 unspecified: Secondary | ICD-10-CM | POA: Diagnosis not present

## 2021-12-01 DIAGNOSIS — E119 Type 2 diabetes mellitus without complications: Secondary | ICD-10-CM | POA: Diagnosis not present

## 2021-12-01 DIAGNOSIS — R2681 Unsteadiness on feet: Secondary | ICD-10-CM | POA: Diagnosis not present

## 2021-12-01 DIAGNOSIS — F32A Depression, unspecified: Secondary | ICD-10-CM | POA: Diagnosis not present

## 2021-12-01 DIAGNOSIS — R339 Retention of urine, unspecified: Secondary | ICD-10-CM | POA: Diagnosis not present

## 2021-12-01 DIAGNOSIS — I10 Essential (primary) hypertension: Secondary | ICD-10-CM | POA: Diagnosis not present

## 2021-12-01 DIAGNOSIS — M6281 Muscle weakness (generalized): Secondary | ICD-10-CM | POA: Diagnosis not present

## 2021-12-01 DIAGNOSIS — K922 Gastrointestinal hemorrhage, unspecified: Secondary | ICD-10-CM | POA: Diagnosis not present

## 2021-12-01 DIAGNOSIS — E46 Unspecified protein-calorie malnutrition: Secondary | ICD-10-CM | POA: Diagnosis not present

## 2021-12-01 DIAGNOSIS — E43 Unspecified severe protein-calorie malnutrition: Secondary | ICD-10-CM | POA: Diagnosis not present

## 2021-12-01 DIAGNOSIS — I779 Disorder of arteries and arterioles, unspecified: Secondary | ICD-10-CM | POA: Diagnosis not present

## 2021-12-03 ENCOUNTER — Other Ambulatory Visit: Payer: Self-pay | Admitting: Internal Medicine

## 2021-12-03 NOTE — Telephone Encounter (Signed)
Please refill as per office routine med refill policy (all routine meds to be refilled for 3 mo or monthly (per pt preference) up to one year from last visit, then month to month grace period for 3 mo, then further med refills will have to be denied) ? ?

## 2021-12-04 DIAGNOSIS — I779 Disorder of arteries and arterioles, unspecified: Secondary | ICD-10-CM | POA: Diagnosis not present

## 2021-12-04 DIAGNOSIS — K922 Gastrointestinal hemorrhage, unspecified: Secondary | ICD-10-CM | POA: Diagnosis not present

## 2021-12-04 DIAGNOSIS — R2681 Unsteadiness on feet: Secondary | ICD-10-CM | POA: Diagnosis not present

## 2021-12-04 DIAGNOSIS — M6281 Muscle weakness (generalized): Secondary | ICD-10-CM | POA: Diagnosis not present

## 2021-12-04 DIAGNOSIS — K529 Noninfective gastroenteritis and colitis, unspecified: Secondary | ICD-10-CM | POA: Diagnosis not present

## 2021-12-04 DIAGNOSIS — N139 Obstructive and reflux uropathy, unspecified: Secondary | ICD-10-CM | POA: Diagnosis not present

## 2021-12-04 DIAGNOSIS — R262 Difficulty in walking, not elsewhere classified: Secondary | ICD-10-CM | POA: Diagnosis not present

## 2021-12-04 DIAGNOSIS — I10 Essential (primary) hypertension: Secondary | ICD-10-CM | POA: Diagnosis not present

## 2021-12-04 DIAGNOSIS — E119 Type 2 diabetes mellitus without complications: Secondary | ICD-10-CM | POA: Diagnosis not present

## 2021-12-04 DIAGNOSIS — E43 Unspecified severe protein-calorie malnutrition: Secondary | ICD-10-CM | POA: Diagnosis not present

## 2021-12-04 DIAGNOSIS — F32A Depression, unspecified: Secondary | ICD-10-CM | POA: Diagnosis not present

## 2021-12-04 DIAGNOSIS — N183 Chronic kidney disease, stage 3 unspecified: Secondary | ICD-10-CM | POA: Diagnosis not present

## 2021-12-05 DIAGNOSIS — E43 Unspecified severe protein-calorie malnutrition: Secondary | ICD-10-CM | POA: Diagnosis not present

## 2021-12-05 DIAGNOSIS — I10 Essential (primary) hypertension: Secondary | ICD-10-CM | POA: Diagnosis not present

## 2021-12-05 DIAGNOSIS — R2681 Unsteadiness on feet: Secondary | ICD-10-CM | POA: Diagnosis not present

## 2021-12-05 DIAGNOSIS — K529 Noninfective gastroenteritis and colitis, unspecified: Secondary | ICD-10-CM | POA: Diagnosis not present

## 2021-12-05 DIAGNOSIS — M6281 Muscle weakness (generalized): Secondary | ICD-10-CM | POA: Diagnosis not present

## 2021-12-05 DIAGNOSIS — I779 Disorder of arteries and arterioles, unspecified: Secondary | ICD-10-CM | POA: Diagnosis not present

## 2021-12-05 DIAGNOSIS — N139 Obstructive and reflux uropathy, unspecified: Secondary | ICD-10-CM | POA: Diagnosis not present

## 2021-12-05 DIAGNOSIS — K922 Gastrointestinal hemorrhage, unspecified: Secondary | ICD-10-CM | POA: Diagnosis not present

## 2021-12-05 DIAGNOSIS — R262 Difficulty in walking, not elsewhere classified: Secondary | ICD-10-CM | POA: Diagnosis not present

## 2021-12-05 DIAGNOSIS — E119 Type 2 diabetes mellitus without complications: Secondary | ICD-10-CM | POA: Diagnosis not present

## 2021-12-05 DIAGNOSIS — F32A Depression, unspecified: Secondary | ICD-10-CM | POA: Diagnosis not present

## 2021-12-05 DIAGNOSIS — N183 Chronic kidney disease, stage 3 unspecified: Secondary | ICD-10-CM | POA: Diagnosis not present

## 2021-12-06 DIAGNOSIS — K529 Noninfective gastroenteritis and colitis, unspecified: Secondary | ICD-10-CM | POA: Diagnosis not present

## 2021-12-06 DIAGNOSIS — M6281 Muscle weakness (generalized): Secondary | ICD-10-CM | POA: Diagnosis not present

## 2021-12-06 DIAGNOSIS — K922 Gastrointestinal hemorrhage, unspecified: Secondary | ICD-10-CM | POA: Diagnosis not present

## 2021-12-06 DIAGNOSIS — E119 Type 2 diabetes mellitus without complications: Secondary | ICD-10-CM | POA: Diagnosis not present

## 2021-12-06 DIAGNOSIS — F32A Depression, unspecified: Secondary | ICD-10-CM | POA: Diagnosis not present

## 2021-12-06 DIAGNOSIS — R2681 Unsteadiness on feet: Secondary | ICD-10-CM | POA: Diagnosis not present

## 2021-12-06 DIAGNOSIS — N183 Chronic kidney disease, stage 3 unspecified: Secondary | ICD-10-CM | POA: Diagnosis not present

## 2021-12-06 DIAGNOSIS — N139 Obstructive and reflux uropathy, unspecified: Secondary | ICD-10-CM | POA: Diagnosis not present

## 2021-12-06 DIAGNOSIS — E43 Unspecified severe protein-calorie malnutrition: Secondary | ICD-10-CM | POA: Diagnosis not present

## 2021-12-06 DIAGNOSIS — R262 Difficulty in walking, not elsewhere classified: Secondary | ICD-10-CM | POA: Diagnosis not present

## 2021-12-06 DIAGNOSIS — I10 Essential (primary) hypertension: Secondary | ICD-10-CM | POA: Diagnosis not present

## 2021-12-06 DIAGNOSIS — I779 Disorder of arteries and arterioles, unspecified: Secondary | ICD-10-CM | POA: Diagnosis not present

## 2021-12-08 DIAGNOSIS — M6281 Muscle weakness (generalized): Secondary | ICD-10-CM | POA: Diagnosis not present

## 2021-12-08 DIAGNOSIS — K529 Noninfective gastroenteritis and colitis, unspecified: Secondary | ICD-10-CM | POA: Diagnosis not present

## 2021-12-08 DIAGNOSIS — I779 Disorder of arteries and arterioles, unspecified: Secondary | ICD-10-CM | POA: Diagnosis not present

## 2021-12-08 DIAGNOSIS — N183 Chronic kidney disease, stage 3 unspecified: Secondary | ICD-10-CM | POA: Diagnosis not present

## 2021-12-08 DIAGNOSIS — R262 Difficulty in walking, not elsewhere classified: Secondary | ICD-10-CM | POA: Diagnosis not present

## 2021-12-08 DIAGNOSIS — R2681 Unsteadiness on feet: Secondary | ICD-10-CM | POA: Diagnosis not present

## 2021-12-08 DIAGNOSIS — F32A Depression, unspecified: Secondary | ICD-10-CM | POA: Diagnosis not present

## 2021-12-08 DIAGNOSIS — K922 Gastrointestinal hemorrhage, unspecified: Secondary | ICD-10-CM | POA: Diagnosis not present

## 2021-12-08 DIAGNOSIS — E119 Type 2 diabetes mellitus without complications: Secondary | ICD-10-CM | POA: Diagnosis not present

## 2021-12-08 DIAGNOSIS — I10 Essential (primary) hypertension: Secondary | ICD-10-CM | POA: Diagnosis not present

## 2021-12-08 DIAGNOSIS — E43 Unspecified severe protein-calorie malnutrition: Secondary | ICD-10-CM | POA: Diagnosis not present

## 2021-12-08 DIAGNOSIS — N139 Obstructive and reflux uropathy, unspecified: Secondary | ICD-10-CM | POA: Diagnosis not present

## 2021-12-11 DIAGNOSIS — K922 Gastrointestinal hemorrhage, unspecified: Secondary | ICD-10-CM | POA: Diagnosis not present

## 2021-12-11 DIAGNOSIS — I779 Disorder of arteries and arterioles, unspecified: Secondary | ICD-10-CM | POA: Diagnosis not present

## 2021-12-11 DIAGNOSIS — F32A Depression, unspecified: Secondary | ICD-10-CM | POA: Diagnosis not present

## 2021-12-11 DIAGNOSIS — I10 Essential (primary) hypertension: Secondary | ICD-10-CM | POA: Diagnosis not present

## 2021-12-11 DIAGNOSIS — M6281 Muscle weakness (generalized): Secondary | ICD-10-CM | POA: Diagnosis not present

## 2021-12-11 DIAGNOSIS — R2681 Unsteadiness on feet: Secondary | ICD-10-CM | POA: Diagnosis not present

## 2021-12-11 DIAGNOSIS — K529 Noninfective gastroenteritis and colitis, unspecified: Secondary | ICD-10-CM | POA: Diagnosis not present

## 2021-12-11 DIAGNOSIS — R262 Difficulty in walking, not elsewhere classified: Secondary | ICD-10-CM | POA: Diagnosis not present

## 2021-12-11 DIAGNOSIS — N183 Chronic kidney disease, stage 3 unspecified: Secondary | ICD-10-CM | POA: Diagnosis not present

## 2021-12-11 DIAGNOSIS — E43 Unspecified severe protein-calorie malnutrition: Secondary | ICD-10-CM | POA: Diagnosis not present

## 2021-12-11 DIAGNOSIS — N139 Obstructive and reflux uropathy, unspecified: Secondary | ICD-10-CM | POA: Diagnosis not present

## 2021-12-11 DIAGNOSIS — E119 Type 2 diabetes mellitus without complications: Secondary | ICD-10-CM | POA: Diagnosis not present

## 2021-12-12 DIAGNOSIS — R338 Other retention of urine: Secondary | ICD-10-CM | POA: Diagnosis not present

## 2021-12-13 DIAGNOSIS — R262 Difficulty in walking, not elsewhere classified: Secondary | ICD-10-CM | POA: Diagnosis not present

## 2021-12-13 DIAGNOSIS — E119 Type 2 diabetes mellitus without complications: Secondary | ICD-10-CM | POA: Diagnosis not present

## 2021-12-13 DIAGNOSIS — R2681 Unsteadiness on feet: Secondary | ICD-10-CM | POA: Diagnosis not present

## 2021-12-13 DIAGNOSIS — N139 Obstructive and reflux uropathy, unspecified: Secondary | ICD-10-CM | POA: Diagnosis not present

## 2021-12-13 DIAGNOSIS — M6281 Muscle weakness (generalized): Secondary | ICD-10-CM | POA: Diagnosis not present

## 2021-12-13 DIAGNOSIS — N183 Chronic kidney disease, stage 3 unspecified: Secondary | ICD-10-CM | POA: Diagnosis not present

## 2021-12-13 DIAGNOSIS — F32A Depression, unspecified: Secondary | ICD-10-CM | POA: Diagnosis not present

## 2021-12-13 DIAGNOSIS — E43 Unspecified severe protein-calorie malnutrition: Secondary | ICD-10-CM | POA: Diagnosis not present

## 2021-12-13 DIAGNOSIS — I779 Disorder of arteries and arterioles, unspecified: Secondary | ICD-10-CM | POA: Diagnosis not present

## 2021-12-13 DIAGNOSIS — K922 Gastrointestinal hemorrhage, unspecified: Secondary | ICD-10-CM | POA: Diagnosis not present

## 2021-12-13 DIAGNOSIS — I10 Essential (primary) hypertension: Secondary | ICD-10-CM | POA: Diagnosis not present

## 2021-12-13 DIAGNOSIS — K529 Noninfective gastroenteritis and colitis, unspecified: Secondary | ICD-10-CM | POA: Diagnosis not present

## 2021-12-14 DIAGNOSIS — K529 Noninfective gastroenteritis and colitis, unspecified: Secondary | ICD-10-CM | POA: Diagnosis not present

## 2021-12-14 DIAGNOSIS — E119 Type 2 diabetes mellitus without complications: Secondary | ICD-10-CM | POA: Diagnosis not present

## 2021-12-14 DIAGNOSIS — E43 Unspecified severe protein-calorie malnutrition: Secondary | ICD-10-CM | POA: Diagnosis not present

## 2021-12-14 DIAGNOSIS — K922 Gastrointestinal hemorrhage, unspecified: Secondary | ICD-10-CM | POA: Diagnosis not present

## 2021-12-14 DIAGNOSIS — I10 Essential (primary) hypertension: Secondary | ICD-10-CM | POA: Diagnosis not present

## 2021-12-14 DIAGNOSIS — R262 Difficulty in walking, not elsewhere classified: Secondary | ICD-10-CM | POA: Diagnosis not present

## 2021-12-14 DIAGNOSIS — R2681 Unsteadiness on feet: Secondary | ICD-10-CM | POA: Diagnosis not present

## 2021-12-14 DIAGNOSIS — M6281 Muscle weakness (generalized): Secondary | ICD-10-CM | POA: Diagnosis not present

## 2021-12-14 DIAGNOSIS — F32A Depression, unspecified: Secondary | ICD-10-CM | POA: Diagnosis not present

## 2021-12-14 DIAGNOSIS — N139 Obstructive and reflux uropathy, unspecified: Secondary | ICD-10-CM | POA: Diagnosis not present

## 2021-12-14 DIAGNOSIS — N183 Chronic kidney disease, stage 3 unspecified: Secondary | ICD-10-CM | POA: Diagnosis not present

## 2021-12-14 DIAGNOSIS — I779 Disorder of arteries and arterioles, unspecified: Secondary | ICD-10-CM | POA: Diagnosis not present

## 2021-12-15 DIAGNOSIS — F32A Depression, unspecified: Secondary | ICD-10-CM | POA: Diagnosis not present

## 2021-12-15 DIAGNOSIS — K529 Noninfective gastroenteritis and colitis, unspecified: Secondary | ICD-10-CM | POA: Diagnosis not present

## 2021-12-15 DIAGNOSIS — K922 Gastrointestinal hemorrhage, unspecified: Secondary | ICD-10-CM | POA: Diagnosis not present

## 2021-12-15 DIAGNOSIS — R339 Retention of urine, unspecified: Secondary | ICD-10-CM | POA: Diagnosis not present

## 2021-12-15 DIAGNOSIS — I10 Essential (primary) hypertension: Secondary | ICD-10-CM | POA: Diagnosis not present

## 2021-12-15 DIAGNOSIS — E119 Type 2 diabetes mellitus without complications: Secondary | ICD-10-CM | POA: Diagnosis not present

## 2021-12-15 DIAGNOSIS — R262 Difficulty in walking, not elsewhere classified: Secondary | ICD-10-CM | POA: Diagnosis not present

## 2021-12-15 DIAGNOSIS — N320 Bladder-neck obstruction: Secondary | ICD-10-CM | POA: Diagnosis not present

## 2021-12-15 DIAGNOSIS — R2681 Unsteadiness on feet: Secondary | ICD-10-CM | POA: Diagnosis not present

## 2021-12-15 DIAGNOSIS — I2510 Atherosclerotic heart disease of native coronary artery without angina pectoris: Secondary | ICD-10-CM | POA: Diagnosis not present

## 2021-12-15 DIAGNOSIS — E43 Unspecified severe protein-calorie malnutrition: Secondary | ICD-10-CM | POA: Diagnosis not present

## 2021-12-15 DIAGNOSIS — R338 Other retention of urine: Secondary | ICD-10-CM | POA: Diagnosis not present

## 2021-12-15 DIAGNOSIS — M6281 Muscle weakness (generalized): Secondary | ICD-10-CM | POA: Diagnosis not present

## 2021-12-15 DIAGNOSIS — N183 Chronic kidney disease, stage 3 unspecified: Secondary | ICD-10-CM | POA: Diagnosis not present

## 2021-12-15 DIAGNOSIS — I779 Disorder of arteries and arterioles, unspecified: Secondary | ICD-10-CM | POA: Diagnosis not present

## 2021-12-15 DIAGNOSIS — N139 Obstructive and reflux uropathy, unspecified: Secondary | ICD-10-CM | POA: Diagnosis not present

## 2021-12-18 DIAGNOSIS — I779 Disorder of arteries and arterioles, unspecified: Secondary | ICD-10-CM | POA: Diagnosis not present

## 2021-12-18 DIAGNOSIS — E119 Type 2 diabetes mellitus without complications: Secondary | ICD-10-CM | POA: Diagnosis not present

## 2021-12-18 DIAGNOSIS — K922 Gastrointestinal hemorrhage, unspecified: Secondary | ICD-10-CM | POA: Diagnosis not present

## 2021-12-18 DIAGNOSIS — K529 Noninfective gastroenteritis and colitis, unspecified: Secondary | ICD-10-CM | POA: Diagnosis not present

## 2021-12-18 DIAGNOSIS — N139 Obstructive and reflux uropathy, unspecified: Secondary | ICD-10-CM | POA: Diagnosis not present

## 2021-12-18 DIAGNOSIS — E43 Unspecified severe protein-calorie malnutrition: Secondary | ICD-10-CM | POA: Diagnosis not present

## 2021-12-18 DIAGNOSIS — R262 Difficulty in walking, not elsewhere classified: Secondary | ICD-10-CM | POA: Diagnosis not present

## 2021-12-18 DIAGNOSIS — N183 Chronic kidney disease, stage 3 unspecified: Secondary | ICD-10-CM | POA: Diagnosis not present

## 2021-12-18 DIAGNOSIS — R2681 Unsteadiness on feet: Secondary | ICD-10-CM | POA: Diagnosis not present

## 2021-12-18 DIAGNOSIS — F32A Depression, unspecified: Secondary | ICD-10-CM | POA: Diagnosis not present

## 2021-12-18 DIAGNOSIS — M6281 Muscle weakness (generalized): Secondary | ICD-10-CM | POA: Diagnosis not present

## 2021-12-18 DIAGNOSIS — I10 Essential (primary) hypertension: Secondary | ICD-10-CM | POA: Diagnosis not present

## 2021-12-19 DIAGNOSIS — N183 Chronic kidney disease, stage 3 unspecified: Secondary | ICD-10-CM | POA: Diagnosis not present

## 2021-12-19 DIAGNOSIS — I779 Disorder of arteries and arterioles, unspecified: Secondary | ICD-10-CM | POA: Diagnosis not present

## 2021-12-19 DIAGNOSIS — R2681 Unsteadiness on feet: Secondary | ICD-10-CM | POA: Diagnosis not present

## 2021-12-19 DIAGNOSIS — N139 Obstructive and reflux uropathy, unspecified: Secondary | ICD-10-CM | POA: Diagnosis not present

## 2021-12-19 DIAGNOSIS — E119 Type 2 diabetes mellitus without complications: Secondary | ICD-10-CM | POA: Diagnosis not present

## 2021-12-19 DIAGNOSIS — M6281 Muscle weakness (generalized): Secondary | ICD-10-CM | POA: Diagnosis not present

## 2021-12-19 DIAGNOSIS — K529 Noninfective gastroenteritis and colitis, unspecified: Secondary | ICD-10-CM | POA: Diagnosis not present

## 2021-12-19 DIAGNOSIS — E43 Unspecified severe protein-calorie malnutrition: Secondary | ICD-10-CM | POA: Diagnosis not present

## 2021-12-19 DIAGNOSIS — R262 Difficulty in walking, not elsewhere classified: Secondary | ICD-10-CM | POA: Diagnosis not present

## 2021-12-19 DIAGNOSIS — I10 Essential (primary) hypertension: Secondary | ICD-10-CM | POA: Diagnosis not present

## 2021-12-19 DIAGNOSIS — K922 Gastrointestinal hemorrhage, unspecified: Secondary | ICD-10-CM | POA: Diagnosis not present

## 2021-12-19 DIAGNOSIS — F32A Depression, unspecified: Secondary | ICD-10-CM | POA: Diagnosis not present

## 2021-12-20 DIAGNOSIS — K529 Noninfective gastroenteritis and colitis, unspecified: Secondary | ICD-10-CM | POA: Diagnosis not present

## 2021-12-20 DIAGNOSIS — N183 Chronic kidney disease, stage 3 unspecified: Secondary | ICD-10-CM | POA: Diagnosis not present

## 2021-12-20 DIAGNOSIS — E43 Unspecified severe protein-calorie malnutrition: Secondary | ICD-10-CM | POA: Diagnosis not present

## 2021-12-20 DIAGNOSIS — N320 Bladder-neck obstruction: Secondary | ICD-10-CM | POA: Diagnosis not present

## 2021-12-20 DIAGNOSIS — I10 Essential (primary) hypertension: Secondary | ICD-10-CM | POA: Diagnosis not present

## 2021-12-20 DIAGNOSIS — E119 Type 2 diabetes mellitus without complications: Secondary | ICD-10-CM | POA: Diagnosis not present

## 2021-12-20 DIAGNOSIS — K922 Gastrointestinal hemorrhage, unspecified: Secondary | ICD-10-CM | POA: Diagnosis not present

## 2021-12-20 DIAGNOSIS — F32A Depression, unspecified: Secondary | ICD-10-CM | POA: Diagnosis not present

## 2021-12-20 DIAGNOSIS — R262 Difficulty in walking, not elsewhere classified: Secondary | ICD-10-CM | POA: Diagnosis not present

## 2021-12-20 DIAGNOSIS — I779 Disorder of arteries and arterioles, unspecified: Secondary | ICD-10-CM | POA: Diagnosis not present

## 2021-12-20 DIAGNOSIS — R2681 Unsteadiness on feet: Secondary | ICD-10-CM | POA: Diagnosis not present

## 2021-12-20 DIAGNOSIS — M6281 Muscle weakness (generalized): Secondary | ICD-10-CM | POA: Diagnosis not present

## 2021-12-20 DIAGNOSIS — N139 Obstructive and reflux uropathy, unspecified: Secondary | ICD-10-CM | POA: Diagnosis not present

## 2021-12-20 DIAGNOSIS — R339 Retention of urine, unspecified: Secondary | ICD-10-CM | POA: Diagnosis not present

## 2021-12-21 DIAGNOSIS — N183 Chronic kidney disease, stage 3 unspecified: Secondary | ICD-10-CM | POA: Diagnosis not present

## 2021-12-21 DIAGNOSIS — K529 Noninfective gastroenteritis and colitis, unspecified: Secondary | ICD-10-CM | POA: Diagnosis not present

## 2021-12-21 DIAGNOSIS — F32A Depression, unspecified: Secondary | ICD-10-CM | POA: Diagnosis not present

## 2021-12-21 DIAGNOSIS — E119 Type 2 diabetes mellitus without complications: Secondary | ICD-10-CM | POA: Diagnosis not present

## 2021-12-21 DIAGNOSIS — M6281 Muscle weakness (generalized): Secondary | ICD-10-CM | POA: Diagnosis not present

## 2021-12-21 DIAGNOSIS — R262 Difficulty in walking, not elsewhere classified: Secondary | ICD-10-CM | POA: Diagnosis not present

## 2021-12-21 DIAGNOSIS — K922 Gastrointestinal hemorrhage, unspecified: Secondary | ICD-10-CM | POA: Diagnosis not present

## 2021-12-21 DIAGNOSIS — E43 Unspecified severe protein-calorie malnutrition: Secondary | ICD-10-CM | POA: Diagnosis not present

## 2021-12-21 DIAGNOSIS — I779 Disorder of arteries and arterioles, unspecified: Secondary | ICD-10-CM | POA: Diagnosis not present

## 2021-12-21 DIAGNOSIS — N139 Obstructive and reflux uropathy, unspecified: Secondary | ICD-10-CM | POA: Diagnosis not present

## 2021-12-21 DIAGNOSIS — R2681 Unsteadiness on feet: Secondary | ICD-10-CM | POA: Diagnosis not present

## 2021-12-21 DIAGNOSIS — I10 Essential (primary) hypertension: Secondary | ICD-10-CM | POA: Diagnosis not present

## 2021-12-22 DIAGNOSIS — R262 Difficulty in walking, not elsewhere classified: Secondary | ICD-10-CM | POA: Diagnosis not present

## 2021-12-22 DIAGNOSIS — M6281 Muscle weakness (generalized): Secondary | ICD-10-CM | POA: Diagnosis not present

## 2021-12-22 DIAGNOSIS — K922 Gastrointestinal hemorrhage, unspecified: Secondary | ICD-10-CM | POA: Diagnosis not present

## 2021-12-22 DIAGNOSIS — N183 Chronic kidney disease, stage 3 unspecified: Secondary | ICD-10-CM | POA: Diagnosis not present

## 2021-12-22 DIAGNOSIS — F32A Depression, unspecified: Secondary | ICD-10-CM | POA: Diagnosis not present

## 2021-12-22 DIAGNOSIS — E43 Unspecified severe protein-calorie malnutrition: Secondary | ICD-10-CM | POA: Diagnosis not present

## 2021-12-22 DIAGNOSIS — I779 Disorder of arteries and arterioles, unspecified: Secondary | ICD-10-CM | POA: Diagnosis not present

## 2021-12-22 DIAGNOSIS — R2681 Unsteadiness on feet: Secondary | ICD-10-CM | POA: Diagnosis not present

## 2021-12-22 DIAGNOSIS — N139 Obstructive and reflux uropathy, unspecified: Secondary | ICD-10-CM | POA: Diagnosis not present

## 2021-12-22 DIAGNOSIS — K529 Noninfective gastroenteritis and colitis, unspecified: Secondary | ICD-10-CM | POA: Diagnosis not present

## 2021-12-22 DIAGNOSIS — E119 Type 2 diabetes mellitus without complications: Secondary | ICD-10-CM | POA: Diagnosis not present

## 2021-12-22 DIAGNOSIS — I10 Essential (primary) hypertension: Secondary | ICD-10-CM | POA: Diagnosis not present

## 2021-12-25 ENCOUNTER — Ambulatory Visit: Payer: Medicare Other | Admitting: Internal Medicine

## 2021-12-25 DIAGNOSIS — M6281 Muscle weakness (generalized): Secondary | ICD-10-CM | POA: Diagnosis not present

## 2021-12-25 DIAGNOSIS — K529 Noninfective gastroenteritis and colitis, unspecified: Secondary | ICD-10-CM | POA: Diagnosis not present

## 2021-12-25 DIAGNOSIS — F32A Depression, unspecified: Secondary | ICD-10-CM | POA: Diagnosis not present

## 2021-12-25 DIAGNOSIS — N183 Chronic kidney disease, stage 3 unspecified: Secondary | ICD-10-CM | POA: Diagnosis not present

## 2021-12-25 DIAGNOSIS — I779 Disorder of arteries and arterioles, unspecified: Secondary | ICD-10-CM | POA: Diagnosis not present

## 2021-12-25 DIAGNOSIS — K922 Gastrointestinal hemorrhage, unspecified: Secondary | ICD-10-CM | POA: Diagnosis not present

## 2021-12-25 DIAGNOSIS — E43 Unspecified severe protein-calorie malnutrition: Secondary | ICD-10-CM | POA: Diagnosis not present

## 2021-12-25 DIAGNOSIS — E119 Type 2 diabetes mellitus without complications: Secondary | ICD-10-CM | POA: Diagnosis not present

## 2021-12-25 DIAGNOSIS — R262 Difficulty in walking, not elsewhere classified: Secondary | ICD-10-CM | POA: Diagnosis not present

## 2021-12-25 DIAGNOSIS — I10 Essential (primary) hypertension: Secondary | ICD-10-CM | POA: Diagnosis not present

## 2021-12-25 DIAGNOSIS — R2681 Unsteadiness on feet: Secondary | ICD-10-CM | POA: Diagnosis not present

## 2021-12-25 DIAGNOSIS — N139 Obstructive and reflux uropathy, unspecified: Secondary | ICD-10-CM | POA: Diagnosis not present

## 2021-12-26 DIAGNOSIS — N139 Obstructive and reflux uropathy, unspecified: Secondary | ICD-10-CM | POA: Diagnosis not present

## 2021-12-26 DIAGNOSIS — E119 Type 2 diabetes mellitus without complications: Secondary | ICD-10-CM | POA: Diagnosis not present

## 2021-12-26 DIAGNOSIS — K922 Gastrointestinal hemorrhage, unspecified: Secondary | ICD-10-CM | POA: Diagnosis not present

## 2021-12-26 DIAGNOSIS — K529 Noninfective gastroenteritis and colitis, unspecified: Secondary | ICD-10-CM | POA: Diagnosis not present

## 2021-12-26 DIAGNOSIS — E43 Unspecified severe protein-calorie malnutrition: Secondary | ICD-10-CM | POA: Diagnosis not present

## 2021-12-26 DIAGNOSIS — R2681 Unsteadiness on feet: Secondary | ICD-10-CM | POA: Diagnosis not present

## 2021-12-26 DIAGNOSIS — M6281 Muscle weakness (generalized): Secondary | ICD-10-CM | POA: Diagnosis not present

## 2021-12-26 DIAGNOSIS — N183 Chronic kidney disease, stage 3 unspecified: Secondary | ICD-10-CM | POA: Diagnosis not present

## 2021-12-26 DIAGNOSIS — I779 Disorder of arteries and arterioles, unspecified: Secondary | ICD-10-CM | POA: Diagnosis not present

## 2021-12-26 DIAGNOSIS — I10 Essential (primary) hypertension: Secondary | ICD-10-CM | POA: Diagnosis not present

## 2021-12-26 DIAGNOSIS — F32A Depression, unspecified: Secondary | ICD-10-CM | POA: Diagnosis not present

## 2021-12-26 DIAGNOSIS — R262 Difficulty in walking, not elsewhere classified: Secondary | ICD-10-CM | POA: Diagnosis not present

## 2021-12-27 DIAGNOSIS — K529 Noninfective gastroenteritis and colitis, unspecified: Secondary | ICD-10-CM | POA: Diagnosis not present

## 2021-12-27 DIAGNOSIS — I10 Essential (primary) hypertension: Secondary | ICD-10-CM | POA: Diagnosis not present

## 2021-12-27 DIAGNOSIS — K922 Gastrointestinal hemorrhage, unspecified: Secondary | ICD-10-CM | POA: Diagnosis not present

## 2021-12-27 DIAGNOSIS — R2681 Unsteadiness on feet: Secondary | ICD-10-CM | POA: Diagnosis not present

## 2021-12-27 DIAGNOSIS — E43 Unspecified severe protein-calorie malnutrition: Secondary | ICD-10-CM | POA: Diagnosis not present

## 2021-12-27 DIAGNOSIS — E119 Type 2 diabetes mellitus without complications: Secondary | ICD-10-CM | POA: Diagnosis not present

## 2021-12-27 DIAGNOSIS — N183 Chronic kidney disease, stage 3 unspecified: Secondary | ICD-10-CM | POA: Diagnosis not present

## 2021-12-27 DIAGNOSIS — F32A Depression, unspecified: Secondary | ICD-10-CM | POA: Diagnosis not present

## 2021-12-27 DIAGNOSIS — R262 Difficulty in walking, not elsewhere classified: Secondary | ICD-10-CM | POA: Diagnosis not present

## 2021-12-27 DIAGNOSIS — N320 Bladder-neck obstruction: Secondary | ICD-10-CM | POA: Diagnosis not present

## 2021-12-27 DIAGNOSIS — M6281 Muscle weakness (generalized): Secondary | ICD-10-CM | POA: Diagnosis not present

## 2021-12-27 DIAGNOSIS — N139 Obstructive and reflux uropathy, unspecified: Secondary | ICD-10-CM | POA: Diagnosis not present

## 2021-12-27 DIAGNOSIS — I779 Disorder of arteries and arterioles, unspecified: Secondary | ICD-10-CM | POA: Diagnosis not present

## 2021-12-27 DIAGNOSIS — R339 Retention of urine, unspecified: Secondary | ICD-10-CM | POA: Diagnosis not present

## 2021-12-29 ENCOUNTER — Telehealth: Payer: Self-pay | Admitting: Internal Medicine

## 2021-12-29 DIAGNOSIS — N138 Other obstructive and reflux uropathy: Secondary | ICD-10-CM | POA: Diagnosis not present

## 2021-12-29 DIAGNOSIS — I10 Essential (primary) hypertension: Secondary | ICD-10-CM | POA: Diagnosis not present

## 2021-12-29 DIAGNOSIS — G8929 Other chronic pain: Secondary | ICD-10-CM | POA: Diagnosis not present

## 2021-12-29 DIAGNOSIS — I2510 Atherosclerotic heart disease of native coronary artery without angina pectoris: Secondary | ICD-10-CM | POA: Diagnosis not present

## 2021-12-29 DIAGNOSIS — N189 Chronic kidney disease, unspecified: Secondary | ICD-10-CM | POA: Diagnosis not present

## 2021-12-29 DIAGNOSIS — R338 Other retention of urine: Secondary | ICD-10-CM | POA: Diagnosis not present

## 2021-12-29 DIAGNOSIS — F03B4 Unspecified dementia, moderate, with anxiety: Secondary | ICD-10-CM | POA: Diagnosis not present

## 2021-12-29 DIAGNOSIS — Z9181 History of falling: Secondary | ICD-10-CM | POA: Diagnosis not present

## 2021-12-29 DIAGNOSIS — E1122 Type 2 diabetes mellitus with diabetic chronic kidney disease: Secondary | ICD-10-CM | POA: Diagnosis not present

## 2021-12-29 DIAGNOSIS — F03B3 Unspecified dementia, moderate, with mood disturbance: Secondary | ICD-10-CM | POA: Diagnosis not present

## 2021-12-29 DIAGNOSIS — R251 Tremor, unspecified: Secondary | ICD-10-CM | POA: Diagnosis not present

## 2021-12-29 DIAGNOSIS — Z7982 Long term (current) use of aspirin: Secondary | ICD-10-CM | POA: Diagnosis not present

## 2021-12-29 DIAGNOSIS — E785 Hyperlipidemia, unspecified: Secondary | ICD-10-CM | POA: Diagnosis not present

## 2021-12-29 DIAGNOSIS — Z960 Presence of urogenital implants: Secondary | ICD-10-CM | POA: Diagnosis not present

## 2021-12-29 DIAGNOSIS — Z7984 Long term (current) use of oral hypoglycemic drugs: Secondary | ICD-10-CM | POA: Diagnosis not present

## 2021-12-29 DIAGNOSIS — E1151 Type 2 diabetes mellitus with diabetic peripheral angiopathy without gangrene: Secondary | ICD-10-CM | POA: Diagnosis not present

## 2021-12-29 DIAGNOSIS — E43 Unspecified severe protein-calorie malnutrition: Secondary | ICD-10-CM | POA: Diagnosis not present

## 2021-12-29 DIAGNOSIS — D631 Anemia in chronic kidney disease: Secondary | ICD-10-CM | POA: Diagnosis not present

## 2021-12-29 NOTE — Telephone Encounter (Signed)
Mary from [facility] home health called and said that they did their first home visit after release from nursing home. ?Nursing is going to see him once a week for 4 weeks ?

## 2021-12-31 DIAGNOSIS — G8929 Other chronic pain: Secondary | ICD-10-CM | POA: Diagnosis not present

## 2021-12-31 DIAGNOSIS — R338 Other retention of urine: Secondary | ICD-10-CM | POA: Diagnosis not present

## 2021-12-31 DIAGNOSIS — E1122 Type 2 diabetes mellitus with diabetic chronic kidney disease: Secondary | ICD-10-CM | POA: Diagnosis not present

## 2021-12-31 DIAGNOSIS — Z7982 Long term (current) use of aspirin: Secondary | ICD-10-CM | POA: Diagnosis not present

## 2021-12-31 DIAGNOSIS — Z9181 History of falling: Secondary | ICD-10-CM | POA: Diagnosis not present

## 2021-12-31 DIAGNOSIS — R251 Tremor, unspecified: Secondary | ICD-10-CM | POA: Diagnosis not present

## 2021-12-31 DIAGNOSIS — I10 Essential (primary) hypertension: Secondary | ICD-10-CM | POA: Diagnosis not present

## 2021-12-31 DIAGNOSIS — Z960 Presence of urogenital implants: Secondary | ICD-10-CM | POA: Diagnosis not present

## 2021-12-31 DIAGNOSIS — N189 Chronic kidney disease, unspecified: Secondary | ICD-10-CM | POA: Diagnosis not present

## 2021-12-31 DIAGNOSIS — E43 Unspecified severe protein-calorie malnutrition: Secondary | ICD-10-CM | POA: Diagnosis not present

## 2021-12-31 DIAGNOSIS — I2510 Atherosclerotic heart disease of native coronary artery without angina pectoris: Secondary | ICD-10-CM | POA: Diagnosis not present

## 2021-12-31 DIAGNOSIS — D631 Anemia in chronic kidney disease: Secondary | ICD-10-CM | POA: Diagnosis not present

## 2021-12-31 DIAGNOSIS — N138 Other obstructive and reflux uropathy: Secondary | ICD-10-CM | POA: Diagnosis not present

## 2021-12-31 DIAGNOSIS — E785 Hyperlipidemia, unspecified: Secondary | ICD-10-CM | POA: Diagnosis not present

## 2021-12-31 DIAGNOSIS — Z7984 Long term (current) use of oral hypoglycemic drugs: Secondary | ICD-10-CM | POA: Diagnosis not present

## 2021-12-31 DIAGNOSIS — E1151 Type 2 diabetes mellitus with diabetic peripheral angiopathy without gangrene: Secondary | ICD-10-CM | POA: Diagnosis not present

## 2021-12-31 DIAGNOSIS — F03B3 Unspecified dementia, moderate, with mood disturbance: Secondary | ICD-10-CM | POA: Diagnosis not present

## 2021-12-31 DIAGNOSIS — F03B4 Unspecified dementia, moderate, with anxiety: Secondary | ICD-10-CM | POA: Diagnosis not present

## 2022-01-02 ENCOUNTER — Encounter: Payer: Self-pay | Admitting: Internal Medicine

## 2022-01-02 NOTE — Telephone Encounter (Signed)
Called Neil [name] at [facility] unable to leave message for verbals mail box is full ?

## 2022-01-02 NOTE — Telephone Encounter (Signed)
Ok for verbals 

## 2022-01-03 ENCOUNTER — Other Ambulatory Visit (INDEPENDENT_AMBULATORY_CARE_PROVIDER_SITE_OTHER): Payer: Medicare Other

## 2022-01-03 ENCOUNTER — Other Ambulatory Visit: Payer: Self-pay | Admitting: Internal Medicine

## 2022-01-03 DIAGNOSIS — E538 Deficiency of other specified B group vitamins: Secondary | ICD-10-CM

## 2022-01-03 DIAGNOSIS — I10 Essential (primary) hypertension: Secondary | ICD-10-CM | POA: Diagnosis not present

## 2022-01-03 DIAGNOSIS — E559 Vitamin D deficiency, unspecified: Secondary | ICD-10-CM

## 2022-01-03 DIAGNOSIS — E785 Hyperlipidemia, unspecified: Secondary | ICD-10-CM | POA: Diagnosis not present

## 2022-01-03 DIAGNOSIS — F03B4 Unspecified dementia, moderate, with anxiety: Secondary | ICD-10-CM | POA: Diagnosis not present

## 2022-01-03 DIAGNOSIS — I2510 Atherosclerotic heart disease of native coronary artery without angina pectoris: Secondary | ICD-10-CM | POA: Diagnosis not present

## 2022-01-03 DIAGNOSIS — Z9181 History of falling: Secondary | ICD-10-CM | POA: Diagnosis not present

## 2022-01-03 DIAGNOSIS — E1122 Type 2 diabetes mellitus with diabetic chronic kidney disease: Secondary | ICD-10-CM | POA: Diagnosis not present

## 2022-01-03 DIAGNOSIS — Z7984 Long term (current) use of oral hypoglycemic drugs: Secondary | ICD-10-CM | POA: Diagnosis not present

## 2022-01-03 DIAGNOSIS — N189 Chronic kidney disease, unspecified: Secondary | ICD-10-CM | POA: Diagnosis not present

## 2022-01-03 DIAGNOSIS — R972 Elevated prostate specific antigen [PSA]: Secondary | ICD-10-CM

## 2022-01-03 DIAGNOSIS — E1165 Type 2 diabetes mellitus with hyperglycemia: Secondary | ICD-10-CM | POA: Diagnosis not present

## 2022-01-03 DIAGNOSIS — E1151 Type 2 diabetes mellitus with diabetic peripheral angiopathy without gangrene: Secondary | ICD-10-CM | POA: Diagnosis not present

## 2022-01-03 DIAGNOSIS — N138 Other obstructive and reflux uropathy: Secondary | ICD-10-CM | POA: Diagnosis not present

## 2022-01-03 DIAGNOSIS — R338 Other retention of urine: Secondary | ICD-10-CM | POA: Diagnosis not present

## 2022-01-03 DIAGNOSIS — R251 Tremor, unspecified: Secondary | ICD-10-CM | POA: Diagnosis not present

## 2022-01-03 DIAGNOSIS — Z960 Presence of urogenital implants: Secondary | ICD-10-CM | POA: Diagnosis not present

## 2022-01-03 DIAGNOSIS — D631 Anemia in chronic kidney disease: Secondary | ICD-10-CM | POA: Diagnosis not present

## 2022-01-03 DIAGNOSIS — E43 Unspecified severe protein-calorie malnutrition: Secondary | ICD-10-CM | POA: Diagnosis not present

## 2022-01-03 DIAGNOSIS — Z7982 Long term (current) use of aspirin: Secondary | ICD-10-CM | POA: Diagnosis not present

## 2022-01-03 DIAGNOSIS — G8929 Other chronic pain: Secondary | ICD-10-CM | POA: Diagnosis not present

## 2022-01-03 DIAGNOSIS — F03B3 Unspecified dementia, moderate, with mood disturbance: Secondary | ICD-10-CM | POA: Diagnosis not present

## 2022-01-03 LAB — HEMOGLOBIN A1C: Hgb A1c MFr Bld: 7.2 % — ABNORMAL HIGH (ref 4.6–6.5)

## 2022-01-03 LAB — URINALYSIS, ROUTINE W REFLEX MICROSCOPIC
Bilirubin Urine: NEGATIVE
Ketones, ur: NEGATIVE
Nitrite: POSITIVE — AB
Specific Gravity, Urine: 1.025 (ref 1.000–1.030)
Total Protein, Urine: 100 — AB
Urine Glucose: NEGATIVE
Urobilinogen, UA: 0.2 (ref 0.0–1.0)
pH: 6.5 (ref 5.0–8.0)

## 2022-01-03 LAB — CBC WITH DIFFERENTIAL/PLATELET
Basophils Absolute: 0.1 10*3/uL (ref 0.0–0.1)
Basophils Relative: 1 % (ref 0.0–3.0)
Eosinophils Absolute: 0.3 10*3/uL (ref 0.0–0.7)
Eosinophils Relative: 3 % (ref 0.0–5.0)
HCT: 35.9 % — ABNORMAL LOW (ref 39.0–52.0)
Hemoglobin: 11.8 g/dL — ABNORMAL LOW (ref 13.0–17.0)
Lymphocytes Relative: 16.7 % (ref 12.0–46.0)
Lymphs Abs: 1.4 10*3/uL (ref 0.7–4.0)
MCHC: 32.8 g/dL (ref 30.0–36.0)
MCV: 85.6 fl (ref 78.0–100.0)
Monocytes Absolute: 0.9 10*3/uL (ref 0.1–1.0)
Monocytes Relative: 10 % (ref 3.0–12.0)
Neutro Abs: 6 10*3/uL (ref 1.4–7.7)
Neutrophils Relative %: 69.3 % (ref 43.0–77.0)
Platelets: 243 10*3/uL (ref 150.0–400.0)
RBC: 4.2 Mil/uL — ABNORMAL LOW (ref 4.22–5.81)
RDW: 14.5 % (ref 11.5–15.5)
WBC: 8.6 10*3/uL (ref 4.0–10.5)

## 2022-01-03 LAB — VITAMIN D 25 HYDROXY (VIT D DEFICIENCY, FRACTURES): VITD: 40.63 ng/mL (ref 30.00–100.00)

## 2022-01-03 LAB — BASIC METABOLIC PANEL
BUN: 21 mg/dL (ref 6–23)
CO2: 28 mEq/L (ref 19–32)
Calcium: 9.6 mg/dL (ref 8.4–10.5)
Chloride: 105 mEq/L (ref 96–112)
Creatinine, Ser: 1.36 mg/dL (ref 0.40–1.50)
GFR: 49.44 mL/min — ABNORMAL LOW (ref >60.00)
Glucose, Bld: 136 mg/dL — ABNORMAL HIGH (ref 70–99)
Potassium: 4.5 mEq/L (ref 3.5–5.1)
Sodium: 141 mEq/L (ref 135–145)

## 2022-01-03 LAB — LIPID PANEL
Cholesterol: 145 mg/dL (ref 0–200)
HDL: 43.7 mg/dL (ref >39.00)
LDL Cholesterol: 79 mg/dL (ref 0–99)
NonHDL: 101.21
Total CHOL/HDL Ratio: 3
Triglycerides: 113 mg/dL (ref 0.0–149.0)
VLDL: 22.6 mg/dL (ref 0.0–40.0)

## 2022-01-03 LAB — VITAMIN B12: Vitamin B-12: >1504 pg/mL — ABNORMAL HIGH (ref 211–911)

## 2022-01-03 LAB — HEPATIC FUNCTION PANEL
ALT: 30 U/L (ref 0–53)
AST: 20 U/L (ref 0–37)
Albumin: 3.9 g/dL (ref 3.5–5.2)
Alkaline Phosphatase: 29 U/L — ABNORMAL LOW (ref 39–117)
Bilirubin, Direct: 0.1 mg/dL (ref 0.0–0.3)
Total Bilirubin: 0.3 mg/dL (ref 0.2–1.2)
Total Protein: 6.6 g/dL (ref 6.0–8.3)

## 2022-01-03 LAB — PSA: PSA: 0.24 ng/mL (ref 0.10–4.00)

## 2022-01-03 LAB — MICROALBUMIN / CREATININE URINE RATIO
Creatinine,U: 113.7 mg/dL
Microalb Creat Ratio: 69.2 mg/g — ABNORMAL HIGH (ref 0.0–30.0)
Microalb, Ur: 78.7 mg/dL — ABNORMAL HIGH (ref 0.0–1.9)

## 2022-01-03 LAB — TSH: TSH: 0.76 u[IU]/mL (ref 0.35–5.50)

## 2022-01-03 MED ORDER — CIPROFLOXACIN HCL 500 MG PO TABS: 500.0000 mg | ORAL_TABLET | Freq: Two times a day (BID) | ORAL | 0 refills | Status: AC

## 2022-01-04 NOTE — Telephone Encounter (Signed)
Mary with [facility] notified via voicemail of verbals ?

## 2022-01-10 ENCOUNTER — Encounter: Payer: Self-pay | Admitting: Internal Medicine

## 2022-01-10 ENCOUNTER — Telehealth: Payer: Self-pay | Admitting: Internal Medicine

## 2022-01-10 ENCOUNTER — Ambulatory Visit (INDEPENDENT_AMBULATORY_CARE_PROVIDER_SITE_OTHER): Payer: Medicare Other | Admitting: Internal Medicine

## 2022-01-10 VITALS — BP 162/70 | HR 61 | Temp 97.8°F | Ht 68.0 in | Wt 166.0 lb

## 2022-01-10 DIAGNOSIS — E559 Vitamin D deficiency, unspecified: Secondary | ICD-10-CM

## 2022-01-10 DIAGNOSIS — Z0001 Encounter for general adult medical examination with abnormal findings: Secondary | ICD-10-CM | POA: Diagnosis not present

## 2022-01-10 DIAGNOSIS — E1165 Type 2 diabetes mellitus with hyperglycemia: Secondary | ICD-10-CM | POA: Diagnosis not present

## 2022-01-10 DIAGNOSIS — E538 Deficiency of other specified B group vitamins: Secondary | ICD-10-CM | POA: Diagnosis not present

## 2022-01-10 DIAGNOSIS — I10 Essential (primary) hypertension: Secondary | ICD-10-CM | POA: Diagnosis not present

## 2022-01-10 DIAGNOSIS — N1831 Chronic kidney disease, stage 3a: Secondary | ICD-10-CM

## 2022-01-10 DIAGNOSIS — E782 Mixed hyperlipidemia: Secondary | ICD-10-CM

## 2022-01-10 DIAGNOSIS — Z978 Presence of other specified devices: Secondary | ICD-10-CM

## 2022-01-10 MED ORDER — SILODOSIN 4 MG PO CAPS: 4.0000 mg | ORAL_CAPSULE | Freq: Every day | ORAL | 3 refills | Status: DC

## 2022-01-10 NOTE — Assessment & Plan Note (Signed)
Last vitamin D ?Lab Results  ?Component Value Date  ? VD25OH 40.63 01/03/2022  ? ?Stable, cont oral replacement ? ?

## 2022-01-10 NOTE — Progress Notes (Signed)
Patient ID: Neil Neil [name], male   DOB: 1941/09/01, 81 y.o.   MRN: [id number] ? ? ? ?     Chief Complaint:: wellness exam and Follow-up ? Htn, dm, low b12, venous insufficiency ? ?     HPI:  OMARE BILOTTA Neil [name] is a 81 y.o. male here for wellness exam; declines shingirx, o/w up to date ?         ?              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  Walking with walker today.  Pt denies chest pain, increased sob or doe, wheezing, orthopnea, PND, palpitations, dizziness or syncope, but does have worsening bilateral leg swelling in last several months, better in the AM, worse in the pm.  .   Pt denies polydipsia, polyuria, or new focal neuro s/s.    Pt denies fever, wt loss, night sweats, loss of appetite, or other constitutional symptoms  Denies urinary symptoms such as dysuria, frequency, urgency, flank pain, hematuria or n/v, fever, chills.  Now finishing antibx with recent abnormal UA, feeling improved.  Has been taking B12 daily.  ?  ?Wt Readings from Last 3 Encounters:  ?01/10/22 166 lb (75.3 kg)  ?10/03/21 147 lb (66.7 kg)  ?10/02/21 152 lb 1.9 oz (69 kg)  ? ?BP Readings from Last 3 Encounters:  ?01/10/22 (!) 162/70  ?10/03/21 140/70  ?10/02/21 (!) 156/69  ? ?Immunization History  ?Administered Date(s) Administered  ? Fluad Quad(high Dose 65+) 06/05/2019, 07/07/2020  ? Influenza Split 07/04/2012  ? Influenza Whole 07/21/2009, 11/06/2010  ? Influenza, High Dose Seasonal PF 07/03/2016, 08/02/2017, 07/10/2018  ? Influenza,inj,Quad PF,6+ Mos 06/24/2013, 06/25/2014, 07/12/2015  ? PFIZER(Purple Top)SARS-COV-2 Vaccination 10/27/2019, 11/16/2019, 07/10/2020, 01/19/2021  ? Pension scheme manager 44yr & up 07/11/2021  ? Pneumococcal Conjugate-13 03/17/2014  ?  Pneumococcal Polysaccharide-23 11/14/2011  ? Td 07/21/2009  ? ?There are no preventive care reminders to display for this patient. ? ?  ? ?Past Medical History:  ?Diagnosis Date  ? ALLERGIC RHINITIS 12/04/2007  ? Arthritis   ? bilateral hands, shoulders, and knees  ? BENIGN PROSTATIC HYPERTROPHY 06/05/2007  ? BUNDLE BRANCH BLOCK, RIGHT 07/21/2009  ? BURSITIS, RIGHT SHOULDER 11/06/2010  ? Choledocholithiasis   ? CKD (chronic kidney disease) stage 3, GFR 30-59 ml/min ([facility] 08/02/2017  ? COLONIC POLYPS, HX OF 06/05/2007  ? tubular adenoma also 02/19/2013  ? Coronary artery calcification seen on CT scan 03/07/2016  ? DIABETES MELLITUS, TYPE Neil [name] 12/04/2007  ? pt states not diabetic-no meds 02-05-13 PV  ? DIVERTICULOSIS, COLON 07/21/2009  ? ERECTILE DYSFUNCTION 06/05/2007  ? Gallstones   ? HYPERLIPIDEMIA 06/05/2007  ? on meds  ? HYPERTENSION 06/05/2007  ? on meds  ? Hypotension   ? INGUINAL HERNIA, RIGHT, SMALL 11/06/2010  ? Liver abscess 05/23/2015  ? SKIN LESION 06/03/2008  ? ?Past Surgical History:  ?Procedure Laterality Date  ? BIOPSY  09/08/2021  ? Procedure: BIOPSY;  Surgeon: BThornton Park MD;  Location: [facility];  Service: Endoscopy;;  ? CHOLECYSTECTOMY N/A 03/02/2015  ? Procedure: LAPAROSCOPIC CHOLECYSTECTOMY;  Surgeon: HFanny Skates MD;  Location: [facility];  Service: General;  Laterality: N/A;  ? COLONOSCOPY  2017  ? JP-MAC-suprep (good)-TA  ?  ERCP N/A 03/01/2015  ? Procedure: ENDOSCOPIC RETROGRADE CHOLANGIOPANCREATOGRAPHY (ERCP);  Surgeon: Irene Shipper, MD;  Location: [facility];  Service: Endoscopy;  Laterality: N/A;  Dr Dalbert Batman wants patient to stay overnight after ERCP  ? ERCP N/A 05/03/2015  ? Procedure: ENDOSCOPIC RETROGRADE CHOLANGIOPANCREATOGRAPHY (ERCP);  Surgeon: Irene Shipper, MD;  Location: [facility];  Service: Endoscopy;  Laterality: N/A;  ? FINGER SURGERY Left   ? "little finger"  ? FLEXIBLE SIGMOIDOSCOPY N/A 09/08/2021  ? Procedure: FLEXIBLE SIGMOIDOSCOPY;  Surgeon: Thornton Park, MD;   Location: [facility];  Service: Endoscopy;  Laterality: N/A;  ? [place]  ? INGUINAL HERNIA REPAIR Right 2014  ? 8'14 repair  ? POLYPECTOMY    ? TONSILLECTOMY    ? [place]  ? ? reports that he quit smoking about 15 years ago. His smoking use included cigarettes. He has never used smokeless tobacco. He reports that he does not drink alcohol and does not use drugs. ?family history includes Brain cancer (age of onset: 52) in his mother; Colon cancer in his paternal uncle; Colon cancer (age of onset: 2) in his mother; Colon polyps in his paternal uncle; Colon polyps (age of onset: 46) in his mother. ?Allergies  ?Allergen Reactions  ? Statins Other (See Comments)  ?   muscle aches  ? Zanaflex [Tizanidine] Other (See Comments)  ?  Dizzy, sleepy  ? Zetia [Ezetimibe] Other (See Comments)  ?  Statins= Muscle and bone pain  ? ?Current Outpatient Medications on File Prior to Visit  ?Medication Sig Dispense Refill  ? acetaminophen (TYLENOL) 500 MG tablet Take 1,500 mg by mouth every 6 (six) hours as needed for mild pain.    ? Alpha-D-Galactosidase (BEANO PO) Take 1 tablet by mouth daily as needed (flatulence).    ? aspirin 81 MG EC tablet Take 81 mg by mouth daily.    ? benazepril (LOTENSIN) 40 MG tablet TAKE 1 TABLET BY MOUTH  DAILY 90 tablet 0  ? cholecalciferol (VITAMIN D) 1000 units tablet Take 1,000 Units by mouth 2 (two) times daily.    ? ciprofloxacin (CIPRO) 500 MG tablet Take 1 tablet (500 mg total) by mouth 2 (two) times daily for 10 days. 20 tablet 0  ? citalopram (CELEXA) 10 MG tablet TAKE 1 TABLET BY MOUTH  DAILY (Patient taking differently: Take 10 mg by mouth daily.) 90 tablet 3  ? Cyanocobalamin (B-12) 1000 MCG TABS Take 1,000 mcg by mouth daily.    ? cyclobenzaprine (FLEXERIL) 5 MG tablet TAKE 1 TABLET BY MOUTH 3  TIMES DAILY AS NEEDED FOR  MUSCLE SPASM(S) (Patient taking differently: Take 5 mg by mouth 3 (three) times daily as needed for muscle spasms.) 270 tablet 1  ?  fenofibrate 160 MG tablet TAKE 1 TABLET BY MOUTH AT  BEDTIME 90 tablet 0  ? finasteride (PROSCAR) 5 MG tablet Take 1 tablet (5 mg total) by mouth daily. 90 tablet 3  ? metFORMIN (GLUCOPHAGE-XR) 500 MG 24 hr tablet Take 500 mg by mouth daily with breakfast.    ? metoprolol succinate (TOPROL-XL) 50 MG 24 hr tablet TAKE 1 TABLET BY MOUTH  DAILY WITH OR IMMEDIATELY  FOLLOWING A MEAL 90 tablet 0  ? Multiple Vitamin (MULTIVITAMIN WITH MINERALS) TABS tablet Take 1 tablet by mouth daily. 30 tablet 0  ? sodium chloride (OCEAN) 0.65 % nasal spray Place 1 spray into the nose daily as needed for congestion.     ? terazosin (HYTRIN) 2 MG capsule  Take 1 capsule (2 mg total) by mouth at bedtime. 90 capsule 3  ? traZODone (DESYREL) 50 MG tablet Take 0.5-1 tablets (25-50 mg total) by mouth at bedtime as needed for sleep. 90 tablet 1  ? ?No current facility-administered medications on file prior to visit.  ? ?     ROS:  All others reviewed and negative. ? ?Objective  ? ?     PE:  BP (!) 162/70 (BP Location: Left Arm, Patient Position: Sitting, Cuff Size: Large)   Pulse 61   Temp 97.8 ?F (36.6 ?C) (Oral)   Ht _0  (1.727 m)   Wt 166 lb (75.3 kg)   SpO2 99%   BMI 25.24 kg/m?  ? ?              Constitutional: Pt appears in NAD ?              HENT: Head: NCAT.  ?              Right Ear: External ear normal.   ?              Left Ear: External ear normal.  ?              Eyes: . Pupils are equal, round, and reactive to light. Conjunctivae and EOM are normal ?              Nose: without d/c or deformity ?              Neck: Neck supple. Gross normal ROM ?              Cardiovascular: Normal rate and regular rhythm.   ?              Pulmonary/Chest: Effort normal and breath sounds without rales or wheezing.  ?              Abd:  Soft, NT, ND, + BS, no organomegaly ?              Neurological: Pt is alert. At baseline orientation, motor grossly intact ?              Skin: Skin is warm. No rashes, no other new lesions, LE edema - 1+  bilateral leg edema to knees ?              Psychiatric: Pt behavior is normal without agitation  ? ?Micro: none ? ?Cardiac tracings I have personally interpreted today:  none ? ?Pertinent Radiological findings (summa

## 2022-01-10 NOTE — Telephone Encounter (Signed)
Pt making provider aware is a generic and his insurance will cover cost ? ?Pt requesting a referral for catheter maintenance fax to [facility] ? ?Pt states he discussed w/ provider the need for the referral at today's ov ? ? ?Fax [phone] ?

## 2022-01-10 NOTE — Assessment & Plan Note (Addendum)
BP Readings from Last 3 Encounters:  ?01/10/22 (!) 162/70  ?10/03/21 140/70  ?10/02/21 (!) 156/69  ? ?Mild uncontrolled, pt to continue medical treatment lotensin, toprol as declines change ? ?

## 2022-01-10 NOTE — Assessment & Plan Note (Signed)
Lab Results  ?Component Value Date  ? OIBBCWUG89 >1504 (H) 01/03/2022  ? ?Overcontrolled, to d/c for now, f/u lab next visit ?

## 2022-01-10 NOTE — Assessment & Plan Note (Signed)
Lab Results  ?Component Value Date  ? HGBA1C 7.2 (H) 01/03/2022  ? ?Mild uncontrolled,  pt to continue current medical treatment metformin as delcines change for now ? ?

## 2022-01-10 NOTE — Assessment & Plan Note (Signed)

## 2022-01-10 NOTE — Assessment & Plan Note (Signed)
Lab Results  ?Component Value Date  ? CREATININE 1.36 01/03/2022  ? ?Stable overall, cont to avoid nephrotoxins ? ?

## 2022-01-10 NOTE — Assessment & Plan Note (Signed)
Lab Results  ?Component Value Date  ? [place] 79 01/03/2022  ? ?Mild uncontrolled, pt to continue current low chol diet - declines statin ? ?

## 2022-01-10 NOTE — Patient Instructions (Addendum)
Please have your Tdap done at the [facility], and consider having the Shingles shot as well ? ?Please continue all other medications as before, and refills have been done if requested - the rapaflo ? ?Ok to stop the B12 for now as you have ? ?Please have the pharmacy call with any other refills you may need. ? ?Please continue your efforts at being more active, low cholesterol diet, and weight control. ? ?You are otherwise up to date with prevention measures today. ? ?Please keep your appointments with your specialists as you may have planned ? ?Please make an Appointment to return in 6 months, or sooner if needed, also with Lab Appointment for testing done 3-5 days before at the [facility] (so this is for TWO appointments - please see the scheduling desk as you leave) ? ?Due to the ongoing Covid 19 pandemic, our lab now requires an appointment for any labs done at our office.  If you need labs done and do not have an appointment, please call our office ahead of time to schedule before presenting to the lab for your testing. ? ? ? ? ? ? ? ? ?

## 2022-01-11 DIAGNOSIS — Z9181 History of falling: Secondary | ICD-10-CM | POA: Diagnosis not present

## 2022-01-11 DIAGNOSIS — Z7982 Long term (current) use of aspirin: Secondary | ICD-10-CM | POA: Diagnosis not present

## 2022-01-11 DIAGNOSIS — F03B3 Unspecified dementia, moderate, with mood disturbance: Secondary | ICD-10-CM | POA: Diagnosis not present

## 2022-01-11 DIAGNOSIS — E1122 Type 2 diabetes mellitus with diabetic chronic kidney disease: Secondary | ICD-10-CM | POA: Diagnosis not present

## 2022-01-11 DIAGNOSIS — N189 Chronic kidney disease, unspecified: Secondary | ICD-10-CM | POA: Diagnosis not present

## 2022-01-11 DIAGNOSIS — I2510 Atherosclerotic heart disease of native coronary artery without angina pectoris: Secondary | ICD-10-CM | POA: Diagnosis not present

## 2022-01-11 DIAGNOSIS — R338 Other retention of urine: Secondary | ICD-10-CM | POA: Diagnosis not present

## 2022-01-11 DIAGNOSIS — I10 Essential (primary) hypertension: Secondary | ICD-10-CM | POA: Diagnosis not present

## 2022-01-11 DIAGNOSIS — F03B4 Unspecified dementia, moderate, with anxiety: Secondary | ICD-10-CM | POA: Diagnosis not present

## 2022-01-11 DIAGNOSIS — Z7984 Long term (current) use of oral hypoglycemic drugs: Secondary | ICD-10-CM | POA: Diagnosis not present

## 2022-01-11 DIAGNOSIS — N138 Other obstructive and reflux uropathy: Secondary | ICD-10-CM | POA: Diagnosis not present

## 2022-01-11 DIAGNOSIS — Z960 Presence of urogenital implants: Secondary | ICD-10-CM | POA: Diagnosis not present

## 2022-01-11 DIAGNOSIS — E785 Hyperlipidemia, unspecified: Secondary | ICD-10-CM | POA: Diagnosis not present

## 2022-01-11 DIAGNOSIS — E1151 Type 2 diabetes mellitus with diabetic peripheral angiopathy without gangrene: Secondary | ICD-10-CM | POA: Diagnosis not present

## 2022-01-11 DIAGNOSIS — D631 Anemia in chronic kidney disease: Secondary | ICD-10-CM | POA: Diagnosis not present

## 2022-01-11 DIAGNOSIS — E43 Unspecified severe protein-calorie malnutrition: Secondary | ICD-10-CM | POA: Diagnosis not present

## 2022-01-11 DIAGNOSIS — R251 Tremor, unspecified: Secondary | ICD-10-CM | POA: Diagnosis not present

## 2022-01-11 DIAGNOSIS — G8929 Other chronic pain: Secondary | ICD-10-CM | POA: Diagnosis not present

## 2022-01-11 NOTE — Telephone Encounter (Signed)
Please clarify which generic medication he is referring to ?

## 2022-01-14 ENCOUNTER — Encounter: Payer: Self-pay | Admitting: Internal Medicine

## 2022-01-15 NOTE — Telephone Encounter (Signed)
Ok done hardcopy order to [facility] ?

## 2022-01-16 ENCOUNTER — Encounter: Payer: Self-pay | Admitting: Internal Medicine

## 2022-01-16 DIAGNOSIS — Z960 Presence of urogenital implants: Secondary | ICD-10-CM | POA: Diagnosis not present

## 2022-01-16 DIAGNOSIS — R251 Tremor, unspecified: Secondary | ICD-10-CM | POA: Diagnosis not present

## 2022-01-16 DIAGNOSIS — F03B3 Unspecified dementia, moderate, with mood disturbance: Secondary | ICD-10-CM | POA: Diagnosis not present

## 2022-01-16 DIAGNOSIS — R338 Other retention of urine: Secondary | ICD-10-CM | POA: Diagnosis not present

## 2022-01-16 DIAGNOSIS — I2510 Atherosclerotic heart disease of native coronary artery without angina pectoris: Secondary | ICD-10-CM | POA: Diagnosis not present

## 2022-01-16 DIAGNOSIS — E1122 Type 2 diabetes mellitus with diabetic chronic kidney disease: Secondary | ICD-10-CM | POA: Diagnosis not present

## 2022-01-16 DIAGNOSIS — D631 Anemia in chronic kidney disease: Secondary | ICD-10-CM | POA: Diagnosis not present

## 2022-01-16 DIAGNOSIS — Z9181 History of falling: Secondary | ICD-10-CM | POA: Diagnosis not present

## 2022-01-16 DIAGNOSIS — I10 Essential (primary) hypertension: Secondary | ICD-10-CM | POA: Diagnosis not present

## 2022-01-16 DIAGNOSIS — F03B4 Unspecified dementia, moderate, with anxiety: Secondary | ICD-10-CM | POA: Diagnosis not present

## 2022-01-16 DIAGNOSIS — Z7984 Long term (current) use of oral hypoglycemic drugs: Secondary | ICD-10-CM | POA: Diagnosis not present

## 2022-01-16 DIAGNOSIS — G8929 Other chronic pain: Secondary | ICD-10-CM | POA: Diagnosis not present

## 2022-01-16 DIAGNOSIS — N138 Other obstructive and reflux uropathy: Secondary | ICD-10-CM | POA: Diagnosis not present

## 2022-01-16 DIAGNOSIS — E1151 Type 2 diabetes mellitus with diabetic peripheral angiopathy without gangrene: Secondary | ICD-10-CM | POA: Diagnosis not present

## 2022-01-16 DIAGNOSIS — Z7982 Long term (current) use of aspirin: Secondary | ICD-10-CM | POA: Diagnosis not present

## 2022-01-16 DIAGNOSIS — E43 Unspecified severe protein-calorie malnutrition: Secondary | ICD-10-CM | POA: Diagnosis not present

## 2022-01-16 DIAGNOSIS — E785 Hyperlipidemia, unspecified: Secondary | ICD-10-CM | POA: Diagnosis not present

## 2022-01-16 DIAGNOSIS — N189 Chronic kidney disease, unspecified: Secondary | ICD-10-CM | POA: Diagnosis not present

## 2022-01-16 NOTE — Telephone Encounter (Signed)
Order has been faxed

## 2022-01-18 DIAGNOSIS — R338 Other retention of urine: Secondary | ICD-10-CM | POA: Diagnosis not present

## 2022-01-18 DIAGNOSIS — N319 Neuromuscular dysfunction of bladder, unspecified: Secondary | ICD-10-CM | POA: Diagnosis not present

## 2022-01-19 ENCOUNTER — Encounter: Payer: Self-pay | Admitting: Internal Medicine

## 2022-02-08 NOTE — Telephone Encounter (Signed)
Pt is asking that a new referral be sent to [facility] for the catheter care. ? ?Fax number is [phone] ?

## 2022-02-08 NOTE — Telephone Encounter (Signed)
Orders re-fa ?

## 2022-02-14 ENCOUNTER — Other Ambulatory Visit: Payer: Self-pay | Admitting: Internal Medicine

## 2022-02-15 DIAGNOSIS — N319 Neuromuscular dysfunction of bladder, unspecified: Secondary | ICD-10-CM | POA: Diagnosis not present

## 2022-02-15 DIAGNOSIS — R338 Other retention of urine: Secondary | ICD-10-CM | POA: Diagnosis not present

## 2022-02-15 MED ORDER — METOPROLOL SUCCINATE ER 50 MG PO TB24: 50.0000 mg | ORAL_TABLET | Freq: Every day | ORAL | 3 refills | Status: DC

## 2022-03-04 ENCOUNTER — Other Ambulatory Visit: Payer: Self-pay | Admitting: Internal Medicine

## 2022-03-05 NOTE — Telephone Encounter (Signed)
Please refill as per office routine med refill policy (all routine meds to be refilled for 3 mo or monthly (per pt preference) up to one year from last visit, then month to month grace period for 3 mo, then further med refills will have to be denied) ? ?

## 2022-03-10 ENCOUNTER — Encounter: Payer: Self-pay | Admitting: Internal Medicine

## 2022-03-13 MED ORDER — TRAZODONE HCL 50 MG PO TABS: 25.0000 mg | ORAL_TABLET | Freq: Every evening | ORAL | 1 refills | Status: DC | PRN

## 2022-03-22 DIAGNOSIS — N319 Neuromuscular dysfunction of bladder, unspecified: Secondary | ICD-10-CM | POA: Diagnosis not present

## 2022-03-22 DIAGNOSIS — R338 Other retention of urine: Secondary | ICD-10-CM | POA: Diagnosis not present

## 2022-03-26 ENCOUNTER — Encounter (HOSPITAL_COMMUNITY): Payer: Self-pay

## 2022-03-26 ENCOUNTER — Other Ambulatory Visit: Payer: Self-pay

## 2022-03-26 ENCOUNTER — Emergency Department (HOSPITAL_COMMUNITY)
Admission: EM | Admit: 2022-03-26 | Discharge: 2022-03-26 | Disposition: A | Discharge status: Home / Self Care | Payer: Medicare Other | Attending: Emergency Medicine | Admitting: Emergency Medicine

## 2022-03-26 DIAGNOSIS — T839XXA Unspecified complication of genitourinary prosthetic device, implant and graft, initial encounter: Secondary | ICD-10-CM

## 2022-03-26 DIAGNOSIS — Z79899 Other long term (current) drug therapy: Secondary | ICD-10-CM | POA: Insufficient documentation

## 2022-03-26 DIAGNOSIS — R6889 Other general symptoms and signs: Secondary | ICD-10-CM | POA: Diagnosis not present

## 2022-03-26 DIAGNOSIS — T83098A Other mechanical complication of other indwelling urethral catheter, initial encounter: Secondary | ICD-10-CM | POA: Insufficient documentation

## 2022-03-26 DIAGNOSIS — Z743 Need for continuous supervision: Secondary | ICD-10-CM | POA: Diagnosis not present

## 2022-03-26 DIAGNOSIS — I10 Essential (primary) hypertension: Secondary | ICD-10-CM | POA: Insufficient documentation

## 2022-03-26 DIAGNOSIS — Z7982 Long term (current) use of aspirin: Secondary | ICD-10-CM | POA: Diagnosis not present

## 2022-03-26 DIAGNOSIS — R109 Unspecified abdominal pain: Secondary | ICD-10-CM | POA: Diagnosis not present

## 2022-03-26 DIAGNOSIS — R339 Retention of urine, unspecified: Secondary | ICD-10-CM | POA: Diagnosis not present

## 2022-03-26 DIAGNOSIS — Y732 Prosthetic and other implants, materials and accessory gastroenterology and urology devices associated with adverse incidents: Secondary | ICD-10-CM | POA: Diagnosis not present

## 2022-03-26 LAB — URINALYSIS, ROUTINE W REFLEX MICROSCOPIC
Bilirubin Urine: NEGATIVE
Glucose, UA: NEGATIVE mg/dL
Ketones, ur: NEGATIVE mg/dL
Nitrite: POSITIVE — AB
Protein, ur: 30 mg/dL — AB
RBC / HPF: >50 RBC/hpf — ABNORMAL HIGH (ref 0–5)
Specific Gravity, Urine: 1.008 (ref 1.005–1.030)
WBC, UA: >50 WBC/hpf — ABNORMAL HIGH (ref 0–5)
pH: 7 (ref 5.0–8.0)

## 2022-03-26 MED ORDER — LIDOCAINE HCL URETHRAL/MUCOSAL 2 % EX GEL
1.0000 | Freq: Once | CUTANEOUS | Status: AC
Start: 2022-03-26 — End: 2022-03-26
  Administered 2022-03-26: 1 via URETHRAL
  Filled 2022-03-26: qty 11

## 2022-03-26 NOTE — ED Provider Notes (Signed)
[facility] DEPT Provider Note   CSN: [id number] Arrival date & time: 03/26/22  0050     History  Chief Complaint  Patient presents with   foley catheter pain    Neil [name] is a 81 y.o. male.  The history is provided by the patient.      Patient with history of hypertension and chronic indwelling Foley presents with Foley catheter complications. He reports he has had a chronic Foley since last winter.  The Foley was changed last week at the urology office.  He reports that they did a "rough" job during the visit.  Since that time he has had pain with urination and feels bladder fullness.  No fevers or vomiting.  No back pain Home Medications Prior to Admission medications   Medication Sig Start Date End Date Taking? Authorizing Provider  acetaminophen (TYLENOL) 500 MG tablet Take 1,500 mg by mouth daily as needed for mild pain.   Yes [provider]  Alpha-D-Galactosidase (BEANO PO) Take 1 tablet by mouth daily as needed (flatulence).   Yes [provider]  aspirin 81 MG EC tablet Take 81 mg by mouth daily. 10/15/08  Yes [provider]  benazepril (LOTENSIN) 40 MG tablet TAKE 1 TABLET BY MOUTH  DAILY Patient taking differently: Take 40 mg by mouth daily. 12/05/21  Yes Biagio Borg, MD  cholecalciferol (VITAMIN D) 1000 units tablet Take 2,000 Units by mouth at bedtime.   Yes [provider]  citalopram (CELEXA) 10 MG tablet TAKE 1 TABLET BY MOUTH  DAILY Patient taking differently: Take 10 mg by mouth daily. 07/12/21  Yes Biagio Borg, MD  cyclobenzaprine (FLEXERIL) 5 MG tablet TAKE 1 TABLET BY MOUTH 3  TIMES DAILY AS NEEDED FOR  MUSCLE SPASM(S) Patient taking differently: Take 5 mg by mouth 3 (three) times daily as needed for muscle spasms. 05/09/21  Yes Biagio Borg, MD  docusate sodium (COLACE) 100 MG capsule Take 100 mg by mouth daily.   Yes [provider]  fenofibrate 160 MG tablet TAKE 1 TABLET  BY MOUTH AT  BEDTIME Patient taking differently: Take 160 mg by mouth at bedtime. 12/05/21  Yes Biagio Borg, MD  finasteride (PROSCAR) 5 MG tablet Take 1 tablet (5 mg total) by mouth daily. 09/21/21  Yes Biagio Borg, MD  metFORMIN (GLUCOPHAGE-XR) 500 MG 24 hr tablet TAKE 1 TABLET BY MOUTH  DAILY WITH BREAKFAST Patient taking differently: Take 500 mg by mouth every evening. 03/06/22  Yes Biagio Borg, MD  metoprolol succinate (TOPROL-XL) 50 MG 24 hr tablet Take 1 tablet (50 mg total) by mouth daily. Take with or immediately following a meal. 02/15/22  Yes Biagio Borg, MD  Multiple Vitamin (MULTIVITAMIN WITH MINERALS) TABS tablet Take 1 tablet by mouth daily. 09/12/21  Yes Debbe Odea, MD  silodosin (RAPAFLO) 4 MG CAPS capsule Take 1 capsule (4 mg total) by mouth daily with breakfast. 01/10/22  Yes Biagio Borg, MD  sodium chloride (OCEAN) 0.65 % nasal spray Place 1 spray into the nose daily as needed for congestion.    Yes [provider]  terazosin (HYTRIN) 2 MG capsule Take 1 capsule (2 mg total) by mouth at bedtime. Patient taking differently: Take 2 mg by mouth daily. 09/21/21  Yes Biagio Borg, MD  traZODone (DESYREL) 50 MG tablet Take 0.5-1 tablets (25-50 mg total) by mouth at bedtime as needed for sleep. Patient taking differently: Take 50 mg by mouth  at bedtime. 03/13/22  Yes Biagio Borg, MD      Allergies    Statins, Zanaflex [tizanidine], and Zetia [ezetimibe]    Review of Systems   Review of Systems  Constitutional:  Negative for fever.  Gastrointestinal:  Negative for vomiting.    Physical Exam Updated Vital Signs BP (!) 154/78   Pulse 74   Temp 97.6 F (36.4 C)   Resp 16   Ht 1.727 m ('5\' 8"'$ )   Wt 71.7 kg   SpO2 98%   BMI 24.02 kg/m  Physical Exam CONSTITUTIONAL: Elderly, uncomfortable appearing HEAD: Normocephalic/atraumatic EYES: EOMI CV: S1/S2 noted, no murmurs/rubs/gallops noted LUNGS: Lungs are clear to auscultation bilaterally, no apparent  distress ABDOMEN: soft, suprapubic tenderness noted, no rebound or guarding, bowel sounds noted throughout abdomen GU: Catheter in place without any complications NEURO: Pt is awake/alert/appropriate, moves all extremitiesx4.  No facial droop.   EXTREMITIES: pulses normal/equal, full ROM SKIN: warm, color normal PSYCH: no abnormalities of mood noted, alert and oriented to situation  ED Results / Procedures / Treatments   Labs (all labs ordered are listed, but only abnormal results are displayed) Labs Reviewed  URINALYSIS, ROUTINE W REFLEX MICROSCOPIC - Abnormal; Notable for the following components:      Result Value   APPearance HAZY (*)    Hgb urine dipstick LARGE (*)    Protein, ur 30 (*)    Nitrite POSITIVE (*)    Leukocytes,Ua LARGE (*)    RBC / HPF >50 (*)    WBC, UA >50 (*)    Bacteria, UA RARE (*)    All other components within normal limits  URINE CULTURE    EKG None  Radiology No results found.  Procedures Procedures    Medications Ordered in ED Medications  lidocaine (XYLOCAINE) 2 % jelly 1 application  (1 application  Urethral Given 03/26/22 0228)    ED Course/ Medical Decision Making/ A&P Clinical Course as of 03/26/22 0355  Mon Mar 26, 2022  0354 Patient feels improved.  After changing the Foley his symptoms have resolved.  His suprapubic tenderness is resolved.  The urine is draining without difficulty.  He will be discharged home and follow-up with urology [DW]  [phone] Patient denies any recent fever chills or infectious symptoms.  Will defer antibiotics for now, urine culture pending [DW]    Clinical Course User Index [DW] Ripley Fraise, MD                           Medical Decision Making Amount and/or Complexity of Data Reviewed Labs: ordered.   Patient presents with Foley catheter complication.  His catheter was replaced and symptoms resolved immediately.  Blood pressure has improved.  Urine culture has been sent.  He has no other acute  complaints he will be discharged home        Final Clinical Impression(s) / ED Diagnoses Final diagnoses:  Complication of Foley catheter, initial encounter [facility])    Rx / [place] Orders ED Discharge Orders     None         Ripley Fraise, MD 03/26/22 [phone]

## 2022-03-26 NOTE — ED Triage Notes (Signed)
Pt arrived via EMS reporting pain after his foley was replaced on Thursday. Pt states it constantly feels like he needs to urinate, denies blood in urine.

## 2022-03-28 LAB — URINE CULTURE: Culture: >=100000 — AB

## 2022-03-29 ENCOUNTER — Telehealth: Payer: Self-pay | Admitting: *Deleted

## 2022-03-29 NOTE — Telephone Encounter (Signed)
Post ED Visit - Positive Culture Follow-up  Culture report reviewed by antimicrobial stewardship pharmacist: [place] Team '[]'$  Elenor Quinones, Pharm.D. '[]'$  Heide Guile, Pharm.D., BCPS AQ-ID '[]'$  Parks Neptune, Pharm.D., BCPS '[]'$  Alycia Rossetti, Pharm.D., BCPS '[]'$  [place], [place].D., BCPS, AAHIVP '[]'$  Legrand Como, Pharm.D., BCPS, AAHIVP '[]'$  Salome Arnt, PharmD, BCPS '[]'$  Johnnette Gourd, PharmD, BCPS '[]'$  Hughes Better, PharmD, BCPS '[]'$  Leeroy Cha, PharmD '[]'$  Laqueta Linden, PharmD, BCPS '[]'$  Albertina Parr, PharmD  [place] Team '[]'$  Leodis Sias, PharmD '[]'$  Lindell Spar, PharmD '[]'$  Royetta Asal, PharmD '[]'$  Graylin Shiver, Rph '[]'$  Rema Fendt) Glennon Mac, PharmD '[]'$  Arlyn Dunning, PharmD '[]'$  Netta Cedars, PharmD '[]'$  Dia Sitter, PharmD '[]'$  Leone Haven, PharmD '[]'$  Gretta Arab, PharmD '[]'$  Theodis Shove, PharmD '[]'$  Peggyann Juba, PharmD '[]'$  Reuel Boom, PharmD   Positive urine culture Not indicative of true infection, and no further patient follow-up is required at this time.  Domenic Moras, PA-C  Harlon Flor Talley 03/29/2022, 8:37 AM

## 2022-03-29 NOTE — Progress Notes (Addendum)
ED Antimicrobial Stewardship Positive Culture Follow Up   Neil [name] is an 81 y.o. male who presented to [facility] on 03/26/2022 with a chief complaint of foley catheter pain.   Chief Complaint  Patient presents with   foley catheter pain    Recent Results (from the past 720 hour(s))  Urine Culture     Status: Abnormal   Collection Time: 03/26/22  3:00 AM   Specimen: Urine, Catheterized  Result Value Ref Range Status   Specimen Description   Final    URINE, CATHETERIZED Performed at [facility] [address], [place], [place] [address]    Special Requests   Final    NONE Performed at [facility], [place] [address] [place], [place] [address]    Culture >=100,000 COLONIES/mL PSEUDOMONAS AERUGINOSA (A)  Final   Report Status 03/28/2022 FINAL  Final   Organism ID, Bacteria PSEUDOMONAS AERUGINOSA (A)  Final      Susceptibility   Pseudomonas aeruginosa - MIC*    CEFTAZIDIME 2 SENSITIVE Sensitive     CIPROFLOXACIN <=0.25 SENSITIVE Sensitive     GENTAMICIN <=1 SENSITIVE Sensitive     IMIPENEM 2 SENSITIVE Sensitive     PIP/TAZO <=4 SENSITIVE Sensitive     CEFEPIME 2 SENSITIVE Sensitive     * >=100,000 COLONIES/mL PSEUDOMONAS AERUGINOSA     '[x]'$  Patient discharged originally without antimicrobial agent. Call patient to check on foley catheter pain and urinary frequency symptoms. No treatment needed if symptoms have resolved.    ED Provider: Domenic Moras, PA-C   Rickey Barbara 03/29/2022, 8:20 AM PharmD Candidate   Attestation: I agree with the plans stated above by pharmacy student Rickey Barbara. We will plan to call patient to see if symptoms have resolved.   Lestine Box, PharmD PGY2 Infectious Diseases Pharmacy Resident   Please check [email or web address] for unit-specific pharmacy phone numbers

## 2022-04-02 ENCOUNTER — Other Ambulatory Visit: Payer: Self-pay | Admitting: Internal Medicine

## 2022-04-08 ENCOUNTER — Other Ambulatory Visit: Payer: Self-pay

## 2022-04-08 ENCOUNTER — Encounter (HOSPITAL_BASED_OUTPATIENT_CLINIC_OR_DEPARTMENT_OTHER): Payer: Self-pay | Admitting: Emergency Medicine

## 2022-04-08 ENCOUNTER — Emergency Department (HOSPITAL_BASED_OUTPATIENT_CLINIC_OR_DEPARTMENT_OTHER)
Admission: EM | Admit: 2022-04-08 | Discharge: 2022-04-08 | Disposition: A | Discharge status: Home / Self Care | Payer: Medicare Other | Attending: Emergency Medicine | Admitting: Emergency Medicine

## 2022-04-08 DIAGNOSIS — R109 Unspecified abdominal pain: Secondary | ICD-10-CM | POA: Diagnosis present

## 2022-04-08 DIAGNOSIS — Z7982 Long term (current) use of aspirin: Secondary | ICD-10-CM | POA: Diagnosis not present

## 2022-04-08 DIAGNOSIS — R339 Retention of urine, unspecified: Secondary | ICD-10-CM | POA: Insufficient documentation

## 2022-04-08 DIAGNOSIS — R6889 Other general symptoms and signs: Secondary | ICD-10-CM | POA: Diagnosis not present

## 2022-04-08 DIAGNOSIS — R1030 Lower abdominal pain, unspecified: Secondary | ICD-10-CM | POA: Diagnosis not present

## 2022-04-08 DIAGNOSIS — Z743 Need for continuous supervision: Secondary | ICD-10-CM | POA: Diagnosis not present

## 2022-04-08 LAB — URINALYSIS, ROUTINE W REFLEX MICROSCOPIC
Bilirubin Urine: NEGATIVE
Glucose, UA: 250 mg/dL — AB
Ketones, ur: NEGATIVE mg/dL
Nitrite: NEGATIVE
Protein, ur: 30 mg/dL — AB
RBC / HPF: >50 RBC/hpf — ABNORMAL HIGH (ref 0–5)
Specific Gravity, Urine: 1.016 (ref 1.005–1.030)
WBC, UA: >50 WBC/hpf — ABNORMAL HIGH (ref 0–5)
pH: 6 (ref 5.0–8.0)

## 2022-04-08 MED ORDER — CIPROFLOXACIN HCL 500 MG PO TABS: 500.0000 mg | ORAL_TABLET | Freq: Two times a day (BID) | ORAL | 0 refills | Status: DC

## 2022-04-10 LAB — URINE CULTURE: Culture: >=100000 — AB

## 2022-04-11 ENCOUNTER — Telehealth: Payer: Self-pay

## 2022-04-11 NOTE — Telephone Encounter (Signed)
Post ED Visit - Positive Culture Follow-up  Culture report reviewed by antimicrobial stewardship pharmacist: [place] Team '[]'$  Elenor Quinones, Pharm.D. '[]'$  Heide Guile, Pharm.D., BCPS AQ-ID '[]'$  Parks Neptune, Pharm.D., BCPS '[]'$  Alycia Rossetti, Pharm.D., BCPS '[]'$  [place], Pharm.D., BCPS, AAHIVP '[]'$  Legrand Como, Pharm.D., BCPS, AAHIVP '[x]'$  Salome Arnt, PharmD, BCPS '[]'$  Johnnette Gourd, PharmD, BCPS '[]'$  Hughes Better, PharmD, BCPS '[]'$  Leeroy Cha, PharmD '[]'$  Laqueta Linden, PharmD, BCPS '[]'$  Albertina Parr, PharmD  [place] Team '[]'$  Leodis Sias, PharmD '[]'$  Lindell Spar, PharmD '[]'$  Royetta Asal, PharmD '[]'$  Graylin Shiver, Rph '[]'$  Rema Fendt) Glennon Mac, PharmD '[]'$  Arlyn Dunning, PharmD '[]'$  Netta Cedars, PharmD '[]'$  Dia Sitter, PharmD '[]'$  Leone Haven, PharmD '[]'$  Gretta Arab, PharmD '[]'$  Theodis Shove, PharmD '[]'$  Peggyann Juba, PharmD '[]'$  Reuel Boom, PharmD   Positive urine culture Treated with Ciprofloxacin HCL, organism sensitive to the same and no further patient follow-up is required at this time.  Glennon Hamilton 04/11/2022, 9:39 AM

## 2022-04-19 DIAGNOSIS — N319 Neuromuscular dysfunction of bladder, unspecified: Secondary | ICD-10-CM | POA: Diagnosis not present

## 2022-04-19 DIAGNOSIS — R338 Other retention of urine: Secondary | ICD-10-CM | POA: Diagnosis not present

## 2022-05-10 DIAGNOSIS — N319 Neuromuscular dysfunction of bladder, unspecified: Secondary | ICD-10-CM | POA: Diagnosis not present

## 2022-05-10 DIAGNOSIS — R338 Other retention of urine: Secondary | ICD-10-CM | POA: Diagnosis not present

## 2022-05-11 ENCOUNTER — Other Ambulatory Visit: Payer: Self-pay | Admitting: Urology

## 2022-05-23 NOTE — Patient Instructions (Signed)
SURGICAL WAITING ROOM VISITATION Patients having surgery or a procedure may have no more than 2 support people in the waiting area - these visitors may rotate.   Children under the age of 45 must have an adult with them who is not the patient. If the patient needs to stay at the hospital during part of their recovery, the visitor guidelines for inpatient rooms apply. Pre-op nurse will coordinate an appropriate time for 1 support person to accompany patient in pre-op.  This support person may not rotate.    Please refer to the [facility] website for the visitor guidelines for Inpatients (after your surgery is over and you are in a regular room).       Your procedure is scheduled on:  05/29/2022    Report to [facility] Main Entrance    Report to admitting at   [phone]   Call this number if you have problems the morning of surgery [phone]   Do not eat food :After Midnight.   After Midnight you may have the following liquids until _0845_____ AM/ PM DAY OF SURGERY  Water Non-Citrus Juices (without pulp, NO RED) Carbonated Beverages Black Coffee (NO MILK/CREAM OR CREAMERS, sugar ok)  Clear Tea (NO MILK/CREAM OR CREAMERS, sugar ok) regular and decaf                             Plain Jell-O (NO RED)                                           Fruit ices (not with fruit pulp, NO RED)                                     Popsicles (NO RED)                                                               Sports drinks like Gatorade (NO RED)           Oral Hygiene is also important to reduce your risk of infection.                                    Remember - BRUSH YOUR TEETH THE MORNING OF SURGERY WITH YOUR REGULAR TOOTHPASTE   Do NOT smoke after Midnight   Take these medicines the morning of surgery with A SIP OF WATER:  proscar, metoprolol, rapaflo   DO NOT TAKE ANY ORAL DIABETIC MEDICATIONS DAY OF YOUR SURGERY  Bring CPAP mask and tubing day of surgery.                               You may not have any metal on your body including hair pins, jewelry, and body piercing             Do not wear make-up, lotions, powders, perfumes/cologne, or deodorant  Do not wear nail polish including gel and  S&S, artificial/acrylic nails, or any other type of covering on natural nails including finger and toenails. If you have artificial nails, gel coating, etc. that needs to be removed by a nail salon please have this removed prior to surgery or surgery may need to be canceled/ delayed if the surgeon/ anesthesia feels like they are unable to be safely monitored.   Do not shave  48 hours prior to surgery.               Men may shave face and neck.   Do not bring valuables to the hospital. [place].   Contacts, dentures or bridgework may not be worn into surgery.   Bring small overnight bag day of surgery.   DO NOT [place]. PHARMACY WILL DISPENSE MEDICATIONS LISTED ON YOUR MEDICATION LIST TO YOU DURING YOUR ADMISSION [place]!    Patients discharged on the day of surgery will not be allowed to drive home.  Someone NEEDS to stay with you for the first 24 hours after anesthesia.   Special Instructions: Bring a copy of your healthcare power of attorney and living will documents         the day of surgery if you haven't scanned them before.              Please read over the following fact sheets you were given: IF YOU HAVE QUESTIONS ABOUT YOUR PRE-OP INSTRUCTIONS PLEASE CALL [phone]     [facility] - Preparing for Surgery Before surgery, you can play an important role.  Because skin is not sterile, your skin needs to be as free of germs as possible.  You can reduce the number of germs on your skin by washing with CHG (chlorahexidine gluconate) soap before surgery.  CHG is an antiseptic cleaner which kills germs and bonds with the skin to continue killing germs even after  washing. Please DO NOT use if you have an allergy to CHG or antibacterial soaps.  If your skin becomes reddened/irritated stop using the CHG and inform your nurse when you arrive at Short Stay. Do not shave (including legs and underarms) for at least 48 hours prior to the first CHG shower.  You may shave your face/neck. Please follow these instructions carefully:  1.  Shower with CHG Soap the night before surgery and the  morning of Surgery.  2.  If you choose to wash your hair, wash your hair first as usual with your  normal  shampoo.  3.  After you shampoo, rinse your hair and body thoroughly to remove the  shampoo.                           4.  Use CHG as you would any other liquid soap.  You can apply chg directly  to the skin and wash                       Gently with a scrungie or clean washcloth.  5.  Apply the CHG Soap to your body ONLY FROM THE NECK DOWN.   Do not use on face/ open                           Wound or open sores. Avoid contact  with eyes, ears mouth and genitals (private parts).                       Wash face,  Genitals (private parts) with your normal soap.             6.  Wash thoroughly, paying special attention to the area where your surgery  will be performed.  7.  Thoroughly rinse your body with warm water from the neck down.  8.  DO NOT shower/wash with your normal soap after using and rinsing off  the CHG Soap.                9.  Pat yourself dry with a clean towel.            10.  Wear clean pajamas.            11.  Place clean sheets on your bed the night of your first shower and do not  sleep with pets. Day of Surgery : Do not apply any lotions/deodorants the morning of surgery.  Please wear clean clothes to the hospital/surgery center.  FAILURE TO FOLLOW THESE INSTRUCTIONS MAY RESULT IN THE CANCELLATION OF YOUR SURGERY PATIENT SIGNATURE_________________________________  NURSE  SIGNATURE__________________________________  ________________________________________________________________________

## 2022-05-23 NOTE — Progress Notes (Addendum)
Anesthesia Review:  PCP: DR [name]- LOV 01/10/22  Cardiologist : none  Chest x-ray : 09/04/21- 1 view  EKG : 09/08/21  Echo :2021  Stress test: 2021  Cardiac Cath :  Activity level: can do a flight of stairs  Sleep Study/ CPAP : none  Fasting Blood Sugar :      / Checks Blood Sugar -- times a day:   Blood Thinner/ Instructions /Last Dose: ASA / Instructions/ Last Dose :   81 mg Aspirin DM- type 2-  Hgba1c-  6.4

## 2022-05-27 ENCOUNTER — Other Ambulatory Visit: Payer: Self-pay | Admitting: Internal Medicine

## 2022-05-27 NOTE — Telephone Encounter (Signed)
Please refill as per office routine med refill policy (all routine meds to be refilled for 3 mo or monthly (per pt preference) up to one year from last visit, then month to month grace period for 3 mo, then further med refills will have to be denied) ? ?

## 2022-05-28 ENCOUNTER — Other Ambulatory Visit: Payer: Self-pay

## 2022-05-28 ENCOUNTER — Encounter (HOSPITAL_COMMUNITY)
Admission: RE | Admit: 2022-05-28 | Discharge: 2022-05-28 | Disposition: A | Discharge status: Home / Self Care | Payer: Medicare Other | Source: Ambulatory Visit | Attending: Urology | Admitting: Urology

## 2022-05-28 ENCOUNTER — Encounter (HOSPITAL_COMMUNITY): Payer: Self-pay

## 2022-05-28 VITALS — BP 157/76 | HR 70 | Temp 97.6°F | Resp 16 | Ht 68.0 in | Wt 157.0 lb

## 2022-05-28 DIAGNOSIS — Z7982 Long term (current) use of aspirin: Secondary | ICD-10-CM | POA: Diagnosis not present

## 2022-05-28 DIAGNOSIS — E119 Type 2 diabetes mellitus without complications: Secondary | ICD-10-CM | POA: Diagnosis not present

## 2022-05-28 DIAGNOSIS — Z01812 Encounter for preprocedural laboratory examination: Secondary | ICD-10-CM | POA: Insufficient documentation

## 2022-05-28 DIAGNOSIS — Z01818 Encounter for other preprocedural examination: Secondary | ICD-10-CM

## 2022-05-28 HISTORY — DX: Anxiety disorder, unspecified: F41.9

## 2022-05-28 HISTORY — DX: Depression, unspecified: F32.A

## 2022-05-28 LAB — CBC
HCT: 41.7 % (ref 39.0–52.0)
Hemoglobin: 13.3 g/dL (ref 13.0–17.0)
MCH: 27.5 pg (ref 26.0–34.0)
MCHC: 31.9 g/dL (ref 30.0–36.0)
MCV: 86.3 fL (ref 80.0–100.0)
Platelets: 280 10*3/uL (ref 150–400)
RBC: 4.83 MIL/uL (ref 4.22–5.81)
RDW: 15.8 % — ABNORMAL HIGH (ref 11.5–15.5)
WBC: 8.4 10*3/uL (ref 4.0–10.5)
nRBC: 0 % (ref 0.0–0.2)

## 2022-05-28 LAB — BASIC METABOLIC PANEL
Anion gap: 6 (ref 5–15)
BUN: 15 mg/dL (ref 8–23)
CO2: 27 mmol/L (ref 22–32)
Calcium: 9.7 mg/dL (ref 8.9–10.3)
Chloride: 107 mmol/L (ref 98–111)
Creatinine, Ser: 1.34 mg/dL — ABNORMAL HIGH (ref 0.61–1.24)
GFR, Estimated: 54 mL/min — ABNORMAL LOW (ref >60)
Glucose, Bld: 140 mg/dL — ABNORMAL HIGH (ref 70–99)
Potassium: 4.2 mmol/L (ref 3.5–5.1)
Sodium: 140 mmol/L (ref 135–145)

## 2022-05-28 LAB — HEMOGLOBIN A1C
Hgb A1c MFr Bld: 6.4 % — ABNORMAL HIGH (ref 4.8–5.6)
Mean Plasma Glucose: 136.98 mg/dL

## 2022-05-28 LAB — GLUCOSE, CAPILLARY: Glucose-Capillary: 130 mg/dL — ABNORMAL HIGH (ref 70–99)

## 2022-05-28 NOTE — H&P (Signed)
09/26/2021: Seen today as a new patient for evaluation of urinary retention. Patient was hospitalized recently from 11/21 through 11/28 for urinary retention, colitis. Who presented to the emergency department initially with abdominal pain, generalized weakness and confusion that has been persistent for several days as well as painful inability to void. Foley catheter placed in the emergency department. CT imaging showed a markedly enlarged prostate gland mild bilateral hydronephrosis with perinephric and perivesical edema. Imaging also suggestive of sigmoid colitis. He was having diarrhea and blood in the stools. GI consulted and patient placed on a course of Cipro and Flagyl as well as Rowasa. 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 This progressively improved with IV hydration and placement of Foley catheter. With past history of BPH managed previously on terazosin 2 mg from primary care. Finasteride was started during his hospitalization. Voiding trial was attempted during the hospitalization but unsuccessful. During his hospitalization he was found to have delirium and was noted to be pulling and tugging on his catheter tubing. This resulted in some hematuria which is since cleared. Delirium also improved as well.   PMH: diabetes mellitus type 2 with last hemoglobin A1c of 9.4 (December 2022), hypertension, hyperlipidemia as well as other comorbidities including BPH, history of bursitis, CKD stage III, history of colonic polyps, history of CAD. His creatinine on 12/8 was 1.11.   Prior to this he denies having significant bothersome daytime voiding symptoms. Typically he will have nocturia x1. No prior history of UTI/prostatitis. No prior history of kidney stones, GU cancer. He had been previously followed in the 2000's for ED.   Presents today from SNF/rehab facility. Tolerating catheter well, denying any painful or  leaking urgency, gross hematuria. Urine output has been quite appropriate by his report. His bedside bag requires being emptied several times per day. Patient endorsing normal daily bowel movements at this time. His abdominal pain and discomfort have resolved. He has been slowly improving with rehab endorsing increasing strength and better appetite. He has had no recent fevers or chills, nausea/vomiting. Currently mentating at his baseline.  Recent CT imaging does indicate an enlarged prostate contributing to his interval development of urinary retention. Despite this, his voiding symptoms are well managed and controlled with ongoing use of terazosin. His development of urinary retention likely multifactorial especially with worsening underlying diabetes and development of colitis. Also can't exclude possibility of neurogenic bladder at this point but I will continue to manage bladder outlet obstruction before pursuing additional evaluation in the form of urodynamics.   Approximately 300cc of sterile water instilled into the bladder. He was not able to void but did endorse a strong desire to have a bowel movement so he was taken to the toilet to allow that to occur. On recheck of PVR, there was approximately 420 cc in the bladder. A 16 French Foley catheter was replaced to gravity drainage. At this point I am going to have him increase his terazosin dosage from 2 to 5 mg. Prescription provided today. He will also continue finasteride as directed. I plan to have him back in 1 to 2 weeks for repeat exam and trial of void. If unsuccessful at that time, he will be referred to one of the urologist here in clinic for additional evaluation. Consideration for pursuing cystoscopy can be had at that time as well as urodynamics if he remains with ongoing urinary retention.   -10/27/21-patient with history of urinary  retention which occurred during hospitalization for colitis. Has been on finasteride 5 mg daily as well as  terazosin. Failed voiding trial 1 month ago when he saw Jiles Crocker. Here now for follow-up of his urinary retention. Remains at skilled nursing facility.  -11/02/21-patient with history of recent urinary retention during episode of colitis as above. He is on finasteride 5 mg daily and recently added silodosin 4 mg daily. Here for follow-up and voiding trial.  Voiding trial: 240 cc was instilled patient was unable to void. Foley catheter was replaced   -12/15/21-patient with history of urinary retention with failed to void despite being on finasteride and silodosin. Underwent recent urodynamics showing maximum bladder capacity of 320 cc with loss of compliance during the filling phase. Intravesical pressures showed an unstable bladder contraction 63 cm of water at a volume of about 250 cc. Patient was unable to mount a voluntary contraction for voiding.   Cystoscopy was performed today and shows marked bilobar prostatic hypertrophy with approximate 4 cm prostatic urethral length. There is mild median lobe hypertrophy as well but main component is bilobar prostatic hypertrophy. Bladder was somewhat trabeculated without obvious mucosal lesions. Inflammatory response from the catheter was noted. Foley replaced after the procedure.       URODYNAMICS STUDY   Test Indication: Urinary Retention  The procedure's risks, benefits and infection risk were discussed with the patient.   PRE UROFLOW & CATHETERIZATION  Procedure: Pre Uroflow Study  Arrived with a 16 fr foley.  A Urodynamic catheter was inserted. Urine sent for culture.   CYSTOMETRY/CYSTOMETROGRAM  The bladder was filled with room temperature water at a rate of less than 50 cc per minute.  Injection of contrast was performed for the cystometrogram.  Max capacity was approx. 320 mls. First sensation occurred at 192 mls. Normal desire occurred at 216 mls. Strong desire occurred at 250 mls. There was loss of compliance with end filling pressures  of 40-50 cmH20.   The bladder was unstable. Patient a couple unstable contractions on top of the loss of compliance.  First unstable contraction occurred at 250 mls.  Max unstable detrusor contraction was 63 cmH20.  He felt an increased urge at the time but was able to inhibit leaking.   LEAK POINT PRESSURE  LPPs were assessed with pt in a seated position.  No leakage was noted with abdominal pressures of 36 cmH20.   PRESSURE FLOW STUDY  He did not generate a voluntary contraction but tried to void off an unstable contraction.   Max Capacity was 320 mls.   ELECTROMYOGRAM  Activity was measured by surface electrodes  There was no voiding phase.   FLUOROSCOPY and VCUG  Some elevation of the bladder base was noted. No reflux was noted.  Pt confirmed no Neulasta OnPro Device.   POST PROCEDURE ANTIBIOTICS:  He was advised to watch for s/s of a break through UTI and to call our office or go to the ED if he develops any of them. Urine sent for culture.   UDS SUMMARY  Mr. Portlock held a max capacity of approx. 320 mls. His 1st sensation was felt at 192 mls. There was loss of compliance with ending pressures of 40-50 cmH20. There was positive instability on top of this loss of compliance. He felt an increase urge but did not leak He did not generate a voluntary contraction and void. Some elevation of the bladder base was noted. No reflux was noted. A 16 French Foley cathter was reinserted before  the patient left the clinic.  -01/18/22-patient with history of recent urinary retention with urodynamics showing poorly functioning bladder. Here for monthly Foley change. We discussed possibility of suprapubic catheter at last office visit. We rediscussed possibility of putting in a suprapubic catheter but for now he wants to continue with urethral Foley.  -02/15/22-patient with history urinary retention and urodynamics showing poorly functioning bladder. Here for monthly Foley change.   -03/22/22 patient  with history of neurogenic bladder and urinary retention managed with indwelling Foley currently. Here for monthly Foley change. Patient is still not interested in suprapubic catheter.   -04/19/22-patient with history neurogenic bladder and urinary retention managed with indwelling Foley currently. Unfortunately had recent pseudomonal UTI and was seen in the ER with clogged up Foley. He was started on Cipro 5 mg twice daily on 04/08/2022 and had Foley replaced at that time. Since then urine has cleared some but he continues to have some intermittent blood periodically but currently clear.  -05/10/22-patient with history of neurogenic bladder and urinary retention. Has indwelling Foley. Recently treated for pseudomonal UTI back on 04/08/2022. We added methenamine in the interim as he was having problems with Foley clogging up. Here for monthly Foley change and to discuss possibility of suprapubic tube placement. In the interim urine has remained fairly clear since starting the methenamine.     ALLERGIES: Statin Drugs Tizanidine Hcl Zetia TABS    MEDICATIONS: Finasteride 5 mg tablet  Metformin Hcl 500 mg tablet  Metoprolol Succinate 50 mg tablet, extended release 24 hr  Terazosin Hcl 5 mg capsule 1 capsule PO Daily  Terazosin Hcl 2 mg capsule  Acetaminophen 500 mg tablet  Aspirin Ec 81 mg tablet, delayed release  Beano 400 unit tablet  Benazepril Hcl 40 mg tablet  Citalopram Hbr 10 mg tablet  Cyclobenzaprine Hcl 5 mg tablet  Fenofibrate 160 mg tablet  Methenamine Hippurate 1 gram tablet 1 tablet PO BID  Multivitamin  Vitamin B-12  Vitamin D3     GU PSH: Complex cystometrogram, w/ void pressure and urethral pressure profile studies, any technique - 12/12/2021 Complex Uroflow - 12/12/2021 Cystoscopy - 12/15/2021 Emg surf Electrd - 12/12/2021 Inject For cystogram - 12/12/2021 Intrabd voidng Press - 12/12/2021     NON-GU PSH: Cholecystectomy (laparoscopic) Extensive Finger Surgery,  Left Hemorrhoidectomy     GU PMH: Bladder, Neuromuscular dysfunction, Unspec - 04/19/2022, - 03/22/2022, - 02/15/2022, - 01/18/2022, - 12/15/2021 Urinary Retention - 04/19/2022, - 03/22/2022, - 02/15/2022, - 01/18/2022, - 12/15/2021, - 11/02/2021, - 10/27/2021, - 09/26/2021 BPH w/LUTS - 12/15/2021, - 12/12/2021, - 11/02/2021, - 10/27/2021, - 09/26/2021 Chronic Kidney Disease    NON-GU PMH: Coronary Artery Disease Depression Diabetes Type 2 Encounter for general adult medical examination without abnormal findings, Encounter for preventive health examination Hypercholesterolemia    FAMILY HISTORY: No Family History    SOCIAL HISTORY: Marital Status: Widowed Current Smoking Status: Patient does not smoke anymore. Has not smoked since 10/15/2006.   Tobacco Use Assessment Completed: Used Tobacco in last 30 days? Does not use smokeless tobacco. Has never drank.     REVIEW OF SYSTEMS:    GU Review Male:   Patient denies frequent urination, hard to postpone urination, burning/ pain with urination, get up at night to urinate, leakage of urine, stream starts and stops, trouble starting your stream, have to strain to urinate , erection problems, and penile pain.  Gastrointestinal (Upper):   Patient denies nausea, vomiting, and indigestion/ heartburn.  Gastrointestinal (Lower):   Patient denies diarrhea and constipation.  Constitutional:   Patient denies fever, night sweats, weight loss, and fatigue.  Skin:   Patient denies skin rash/ lesion and itching.  Eyes:   Patient denies blurred vision and double vision.  Ears/ Nose/ Throat:   Patient denies sore throat and sinus problems.  Hematologic/Lymphatic:   Patient denies swollen glands and easy bruising.  Cardiovascular:   Patient denies chest pains and leg swelling.  Respiratory:   Patient denies cough and shortness of breath.  Endocrine:   Patient denies excessive thirst.  Musculoskeletal:   Patient denies back pain and joint pain.  Neurological:   Patient denies  headaches and dizziness.  Psychologic:   Patient denies depression and anxiety.   VITAL SIGNS: None   PAST DATA REVIEW: None   PROCEDURES:         Simple Foley Indwelling Cath Change - 51702  The patient's indwelling foley tube was carefully removed. Patient was cleaned with betadine then inserted lidocaine uroget for numbing. A 16 French Foley catheter was inserted into the bladder using sterile technique. The patient was taught routine catheter care. Hand irrigation of the bladder with sterile water was performed. A leg bag was connected.   ASSESSMENT:      ICD-10 Details  1 GU:   Bladder, Neuromuscular dysfunction, Unspec - N31.9 Chronic, Stable  2   Urinary Retention - C12.7 Acute, Complicated Injury     PLAN:            Medications Refill Meds: Methenamine Hippurate 1 gram tablet 1 tablet PO BID   #180  3 Refill(s)            Document Letter(s):  Created for Patient: Clinical Summary         Notes:   Foley catheter changed today. We discussed possibility of placing suprapubic catheter and he wants to proceed. I discussed risk and benefits procedure in detail including risk of anesthesia risk of injury to bladder bowel or adjacent vascular structures. Patient understands risks and desires to proceed. We will schedule accordingly in the near future.

## 2022-05-29 ENCOUNTER — Ambulatory Visit (HOSPITAL_COMMUNITY): Payer: Medicare Other | Admitting: Certified Registered Nurse Anesthetist

## 2022-05-29 ENCOUNTER — Ambulatory Visit (HOSPITAL_BASED_OUTPATIENT_CLINIC_OR_DEPARTMENT_OTHER): Payer: Medicare Other | Admitting: Certified Registered Nurse Anesthetist

## 2022-05-29 ENCOUNTER — Ambulatory Visit (HOSPITAL_COMMUNITY)
Admission: RE | Admit: 2022-05-29 | Discharge: 2022-05-29 | Disposition: A | Discharge status: Home / Self Care | Payer: Medicare Other | Attending: Urology | Admitting: Urology

## 2022-05-29 ENCOUNTER — Encounter (HOSPITAL_COMMUNITY)
Admission: RE | Disposition: A | Discharge status: Home / Self Care | Payer: Self-pay | Source: Home / Self Care | Attending: Urology

## 2022-05-29 ENCOUNTER — Encounter (HOSPITAL_COMMUNITY): Payer: Self-pay | Admitting: Urology

## 2022-05-29 DIAGNOSIS — N319 Neuromuscular dysfunction of bladder, unspecified: Secondary | ICD-10-CM

## 2022-05-29 DIAGNOSIS — E1122 Type 2 diabetes mellitus with diabetic chronic kidney disease: Secondary | ICD-10-CM | POA: Diagnosis not present

## 2022-05-29 DIAGNOSIS — I129 Hypertensive chronic kidney disease with stage 1 through stage 4 chronic kidney disease, or unspecified chronic kidney disease: Secondary | ICD-10-CM | POA: Diagnosis not present

## 2022-05-29 DIAGNOSIS — I739 Peripheral vascular disease, unspecified: Secondary | ICD-10-CM | POA: Insufficient documentation

## 2022-05-29 DIAGNOSIS — N401 Enlarged prostate with lower urinary tract symptoms: Secondary | ICD-10-CM | POA: Insufficient documentation

## 2022-05-29 DIAGNOSIS — Z87891 Personal history of nicotine dependence: Secondary | ICD-10-CM | POA: Insufficient documentation

## 2022-05-29 DIAGNOSIS — M1990 Unspecified osteoarthritis, unspecified site: Secondary | ICD-10-CM | POA: Insufficient documentation

## 2022-05-29 DIAGNOSIS — R338 Other retention of urine: Secondary | ICD-10-CM | POA: Insufficient documentation

## 2022-05-29 DIAGNOSIS — I2510 Atherosclerotic heart disease of native coronary artery without angina pectoris: Secondary | ICD-10-CM | POA: Insufficient documentation

## 2022-05-29 DIAGNOSIS — N1832 Chronic kidney disease, stage 3b: Secondary | ICD-10-CM | POA: Diagnosis not present

## 2022-05-29 DIAGNOSIS — Z01818 Encounter for other preprocedural examination: Secondary | ICD-10-CM

## 2022-05-29 DIAGNOSIS — D494 Neoplasm of unspecified behavior of bladder: Secondary | ICD-10-CM | POA: Diagnosis not present

## 2022-05-29 DIAGNOSIS — N1831 Chronic kidney disease, stage 3a: Secondary | ICD-10-CM | POA: Diagnosis not present

## 2022-05-29 DIAGNOSIS — R339 Retention of urine, unspecified: Secondary | ICD-10-CM | POA: Diagnosis not present

## 2022-05-29 DIAGNOSIS — N318 Other neuromuscular dysfunction of bladder: Secondary | ICD-10-CM | POA: Diagnosis not present

## 2022-05-29 DIAGNOSIS — N189 Chronic kidney disease, unspecified: Secondary | ICD-10-CM | POA: Diagnosis not present

## 2022-05-29 DIAGNOSIS — Z7984 Long term (current) use of oral hypoglycemic drugs: Secondary | ICD-10-CM | POA: Diagnosis not present

## 2022-05-29 DIAGNOSIS — F32A Depression, unspecified: Secondary | ICD-10-CM | POA: Insufficient documentation

## 2022-05-29 HISTORY — PX: INSERTION OF SUPRAPUBIC CATHETER: SHX5870

## 2022-05-29 HISTORY — PX: CYSTOSCOPY: SHX5120

## 2022-05-29 LAB — GLUCOSE, CAPILLARY
Glucose-Capillary: 148 mg/dL — ABNORMAL HIGH (ref 70–99)
Glucose-Capillary: 119 mg/dL — ABNORMAL HIGH (ref 70–99)

## 2022-05-29 SURGERY — INSERTION, SUPRAPUBIC CATHETER
Anesthesia: General

## 2022-05-29 MED ORDER — ONDANSETRON HCL 4 MG/2ML IJ SOLN
INTRAMUSCULAR | Status: AC
  Filled 2022-05-29: qty 2

## 2022-05-29 MED ORDER — FENTANYL CITRATE (PF) 100 MCG/2ML IJ SOLN
INTRAMUSCULAR | Status: DC | PRN
  Administered 2022-05-29 (×2): 25 ug via INTRAVENOUS
  Administered 2022-05-29: 50 ug via INTRAVENOUS

## 2022-05-29 MED ORDER — ONDANSETRON HCL 4 MG/2ML IJ SOLN: 4.0000 mg | Freq: Once | INTRAMUSCULAR | Status: DC | PRN

## 2022-05-29 MED ORDER — OXYCODONE HCL 5 MG PO TABS
5.0000 mg | ORAL_TABLET | Freq: Once | ORAL | Status: AC | PRN
  Administered 2022-05-29: 5 mg via ORAL

## 2022-05-29 MED ORDER — OXYCODONE HCL 5 MG PO TABS
ORAL_TABLET | ORAL | Status: AC
  Filled 2022-05-29: qty 1

## 2022-05-29 MED ORDER — DEXAMETHASONE SODIUM PHOSPHATE 4 MG/ML IJ SOLN
INTRAMUSCULAR | Status: DC | PRN
  Administered 2022-05-29: 5 mg via INTRAVENOUS

## 2022-05-29 MED ORDER — LIDOCAINE 2% (20 MG/ML) 5 ML SYRINGE
INTRAMUSCULAR | Status: AC
Start: 2022-05-29 — End: ?
  Filled 2022-05-29: qty 5

## 2022-05-29 MED ORDER — BUPIVACAINE HCL (PF) 0.5 % IJ SOLN
INTRAMUSCULAR | Status: AC
  Filled 2022-05-29: qty 30

## 2022-05-29 MED ORDER — PROPOFOL 10 MG/ML IV BOLUS
INTRAVENOUS | Status: DC | PRN
  Administered 2022-05-29: 120 mg via INTRAVENOUS

## 2022-05-29 MED ORDER — TRAMADOL HCL 50 MG PO TABS: 50.0000 mg | ORAL_TABLET | Freq: Four times a day (QID) | ORAL | 0 refills | Status: DC | PRN

## 2022-05-29 MED ORDER — STERILE WATER FOR IRRIGATION IR SOLN
Status: DC | PRN
  Administered 2022-05-29: 3000 mL

## 2022-05-29 MED ORDER — OXYCODONE HCL 5 MG/5ML PO SOLN: 5.0000 mg | Freq: Once | ORAL | Status: AC | PRN

## 2022-05-29 MED ORDER — LIDOCAINE 2% (20 MG/ML) 5 ML SYRINGE
INTRAMUSCULAR | Status: DC | PRN
  Administered 2022-05-29: 100 mg via INTRAVENOUS

## 2022-05-29 MED ORDER — FENTANYL CITRATE (PF) 100 MCG/2ML IJ SOLN
INTRAMUSCULAR | Status: AC
  Filled 2022-05-29: qty 2

## 2022-05-29 MED ORDER — CHLORHEXIDINE GLUCONATE 0.12 % MT SOLN
15.0000 mL | Freq: Once | OROMUCOSAL | Status: AC
  Administered 2022-05-29: 15 mL via OROMUCOSAL

## 2022-05-29 MED ORDER — LACTATED RINGERS IV SOLN: INTRAVENOUS | Status: DC

## 2022-05-29 MED ORDER — ACETAMINOPHEN 160 MG/5ML PO SOLN: 325.0000 mg | ORAL | Status: DC | PRN

## 2022-05-29 MED ORDER — ACETAMINOPHEN 325 MG PO TABS: 325.0000 mg | ORAL_TABLET | ORAL | Status: DC | PRN

## 2022-05-29 MED ORDER — 0.9 % SODIUM CHLORIDE (POUR BTL) OPTIME
TOPICAL | Status: DC | PRN
  Administered 2022-05-29: 1000 mL

## 2022-05-29 MED ORDER — ORAL CARE MOUTH RINSE: 15.0000 mL | Freq: Once | OROMUCOSAL | Status: AC

## 2022-05-29 MED ORDER — ONDANSETRON HCL 4 MG/2ML IJ SOLN
INTRAMUSCULAR | Status: DC | PRN
  Administered 2022-05-29: 4 mg via INTRAVENOUS

## 2022-05-29 MED ORDER — MEPERIDINE HCL 50 MG/ML IJ SOLN: 6.2500 mg | INTRAMUSCULAR | Status: DC | PRN

## 2022-05-29 MED ORDER — CEFAZOLIN SODIUM-DEXTROSE 2-4 GM/100ML-% IV SOLN
2.0000 g | INTRAVENOUS | Status: AC
  Administered 2022-05-29: 2 g via INTRAVENOUS
  Filled 2022-05-29: qty 100

## 2022-05-29 MED ORDER — FENTANYL CITRATE PF 50 MCG/ML IJ SOSY: 25.0000 ug | PREFILLED_SYRINGE | INTRAMUSCULAR | Status: DC | PRN

## 2022-05-29 MED ORDER — BUPIVACAINE HCL (PF) 0.5 % IJ SOLN
INTRAMUSCULAR | Status: DC | PRN
  Administered 2022-05-29: 6 mL

## 2022-05-29 SURGICAL SUPPLY — 40 items
ABDOMINAL PAD ABD ×1 IMPLANT
BAG COUNTER SPONGE SURGICOUNT (BAG) IMPLANT
BAG DRN RND TRDRP ANRFLXCHMBR (UROLOGICAL SUPPLIES)
BAG SPNG CNTER NS LX DISP (BAG)
BAG URINE DRAIN 2000ML AR STRL (UROLOGICAL SUPPLIES) ×1 IMPLANT
BAG URINE LEG 500ML (DRAIN) ×3 IMPLANT
BAG URO CATCHER STRL LF (MISCELLANEOUS) ×2 IMPLANT
BLADE SURG 15 STRL LF DISP TIS (BLADE) ×1 IMPLANT
BLADE SURG 15 STRL SS (BLADE) ×2
BULB IRRIG PATHFIND (MISCELLANEOUS) IMPLANT
CATH FOLEY 2W COUNCIL 5CC 18FR (CATHETERS) ×1 IMPLANT
CATH FOLEY 2WAY SLVR  5CC 16FR (CATHETERS) ×2
CATH FOLEY 2WAY SLVR 5CC 16FR (CATHETERS) IMPLANT
CATH URETL OPEN END 6FR 70 (CATHETERS) ×1 IMPLANT
CLOTH BEACON ORANGE TIMEOUT ST (SAFETY) ×1 IMPLANT
COUNTER NEEDLE 20 DBL MAG RED (NEEDLE) ×2 IMPLANT
COVER SURGICAL LIGHT HANDLE (MISCELLANEOUS) ×2 IMPLANT
ELECT REM PT RETURN 15FT ADLT (MISCELLANEOUS) ×2 IMPLANT
GAUZE 4X4 16PLY ~~LOC~~+RFID DBL (SPONGE) ×2 IMPLANT
GLOVE SURG LX 7.5 STRW (GLOVE) ×1
GLOVE SURG LX STRL 7.5 STRW (GLOVE) ×1 IMPLANT
GOWN STRL REUS W/ TWL XL LVL3 (GOWN DISPOSABLE) ×1 IMPLANT
GOWN STRL REUS W/TWL XL LVL3 (GOWN DISPOSABLE) ×2
GUIDEWIRE STR DUAL SENSOR (WIRE) ×1 IMPLANT
KIT TURNOVER KIT A (KITS) IMPLANT
MANIFOLD NEPTUNE II (INSTRUMENTS) ×2 IMPLANT
NEEDLE HYPO 22GX1.5 SAFETY (NEEDLE) ×1 IMPLANT
NS IRRIG 1000ML POUR BTL (IV SOLUTION) ×2 IMPLANT
PACK CYSTO (CUSTOM PROCEDURE TRAY) ×2 IMPLANT
PLUG CATH AND CAP STER (CATHETERS) ×1 IMPLANT
SUT CHROMIC 3 0 SH 27 (SUTURE) ×1 IMPLANT
SUT SILK 2 0 SH (SUTURE) ×1 IMPLANT
SYR 10ML LL (SYRINGE) ×2 IMPLANT
SYR 20ML LL LF (SYRINGE) ×2 IMPLANT
SYR CONTROL 10ML LL (SYRINGE) ×1 IMPLANT
TAPE CLOTH SURG 6X10 WHT LF (GAUZE/BANDAGES/DRESSINGS) ×1 IMPLANT
TOWEL OR 17X26 10 PK STRL BLUE (TOWEL DISPOSABLE) ×2 IMPLANT
TUBING CONNECTING 10 (TUBING) ×2 IMPLANT
TUBING UROLOGY SET (TUBING) IMPLANT
WATER STERILE IRR 3000ML UROMA (IV SOLUTION) ×2 IMPLANT

## 2022-05-29 NOTE — Op Note (Signed)
Preoperative diagnosis:  1.  Neurogenic bladder with urinary retention  Postoperative diagnosis: 1.  Same  Procedure(s): 1.  Cystoscopy placement of suprapubic cystostomy tube  Surgeon: Dr. Harold Barban  Anesthesia: General  Complications: None  EBL: Minimal  Specimens: None  Disposition of specimens: Not applicable  Intraoperative findings: Large bladder mucosal lesions.  18 [place] [employer] without difficulty.  P tube capped off.  Urethral Foley replaced to allow sealing around the SP tube  Indication: 81 year old white male with recent urinary retention and poorly functioning bladder on urodynamics.  Presents at this time to go SP tube placement.  Description of procedure:  After obtaining informed consent for the patient is taken the major cystoscopy suite placed under general anesthesia.  He is placed in the dorsolithotomy position genitalia and suprapubic area prepped and draped in usual sterile fashion.  Foley catheter was removed.  Proper pause and timeout was performed.  85 Fransico was advanced in the bladder with without difficulty.  Patient has trilobar prostatic hypertrophy and enlarged bladder.  No mucosal lesions noted.  Bladder was filled to its capacity with about 5 to 600 cc of sterile water.  The cystoscope was removed and the curved Lowsley retractor was then placed into the bladder without difficulty.  Tip of the Lowsley retractor was then utilized to tent up the dome of the bladder through the suprapubic skin surface where it could be palpated.  A knife was used to make a stab incision overlying this area and the Bovie was then utilized to cut down on the tip of the Lowsley retractor.  This was then passed out through the suprapubic incision.  The jaws of the Lowsley retractor were opened and a 18 council Foley was placed and the jaws closed.  This was then utilized to withdraw the tip of the Foley into the bladder  where balloon was inflated 10 cc curved Lowsley retractor open the jaws allow the Foley to disengage and then closed the jaws to remove it.  Repeat cystoscopy confirmed the council tip Foley in good position in the bladder there was some mild oozing at the suprapubic site but not significant.  A single chromic suture was utilized to tighten up the suprapubic skin incision around the SP type to prevent leakage.  A plug was placed in the suprapubic catheter and this was taped down under ABD gauze.  Urethral Foley was replaced to drain the bladder to allow sealing of the SP tube.  This was placed to gravity drain with pink urine but no clots.  Procedure was terminated he was awake from anesthesia and taken back to the recovery in stable condition.  No immediate complication from the procedure.

## 2022-05-29 NOTE — Transfer of Care (Signed)
Immediate Anesthesia Transfer of Care Note  Patient: Neil [name]  Procedure(s) Performed: INSERTION OF SUPRAPUBIC CATHETER CYSTOSCOPY  Patient Location: PACU  Anesthesia Type:General  Level of Consciousness: drowsy  Airway & Oxygen Therapy: Patient Spontanous Breathing and Patient connected to face mask  Post-op Assessment: Report given to RN and Post -op Vital signs reviewed and stable  Post vital signs: Reviewed and stable  Last Vitals:  Vitals Value Taken Time  BP 170/71 05/29/22 1145  Temp    Pulse 51 05/29/22 1146  Resp 13 05/29/22 1146  SpO2 100 % 05/29/22 1146  Vitals shown include unvalidated device data.  Last Pain:  Vitals:   05/29/22 1000  TempSrc:   PainSc: 0-No pain         Complications: No notable events documented.

## 2022-05-29 NOTE — Anesthesia Preprocedure Evaluation (Addendum)
Anesthesia Evaluation  Patient identified by MRN, date of birth, ID band Patient awake    Reviewed: Allergy & Precautions, NPO status , Patient's Chart, lab work & pertinent test results  Airway Mallampati: III  TM Distance: >3 FB Neck ROM: Full    Dental  (+) Edentulous Upper, Edentulous Lower, Upper Dentures, Lower Dentures   Pulmonary former smoker,    breath sounds clear to auscultation       Cardiovascular hypertension, Pt. on home beta blockers + CAD and + Peripheral Vascular Disease  + dysrhythmias  Rhythm:Irregular Rate:Normal     Neuro/Psych PSYCHIATRIC DISORDERS Depression  Neuromuscular disease    GI/Hepatic negative GI ROS, (+) Hepatitis -  Endo/Other  diabetes, Type 2, Oral Hypoglycemic Agents  Renal/GU CRFRenal disease     Musculoskeletal  (+) Arthritis ,   Abdominal Normal abdominal exam  (+)   Peds  Hematology   Anesthesia Other Findings   Reproductive/Obstetrics                            Anesthesia Physical  Anesthesia Plan  ASA: 3  Anesthesia Plan: General   Post-op Pain Management:    Induction: Intravenous  PONV Risk Score and Plan: 2 and Ondansetron, Dexamethasone and Treatment may vary due to age or medical condition  Airway Management Planned: LMA  Additional Equipment: None  Intra-op Plan:   Post-operative Plan:   Informed Consent: I have reviewed the patients History and Physical, chart, labs and discussed the procedure including the risks, benefits and alternatives for the proposed anesthesia with the patient or authorized representative who has indicated his/her understanding and acceptance.       Plan Discussed with: CRNA and Anesthesiologist  Anesthesia Plan Comments: (  PMH: diabetes mellitus type 2 with last hemoglobin A1c of 9.4 (December 2022), hypertension, hyperlipidemia as well as other comorbidities including BPH, history of  bursitis, CKD stage III, history of colonic polyps, history of CAD. His creatinine on 12/8 was 1.11. )       Anesthesia Quick Evaluation

## 2022-05-29 NOTE — Anesthesia Procedure Notes (Signed)
Procedure Name: LMA Insertion Date/Time: 05/29/2022 11:00 AM  Performed by: Claudia Desanctis, CRNAPre-anesthesia Checklist: Emergency Drugs available, Patient identified, Suction available and Patient being monitored Patient Re-evaluated:Patient Re-evaluated prior to induction Oxygen Delivery Method: Circle system utilized Preoxygenation: Pre-oxygenation with 100% oxygen Induction Type: IV induction Ventilation: Mask ventilation without difficulty LMA: LMA with gastric port inserted LMA Size: 4.0 Number of attempts: 1 Placement Confirmation: positive ETCO2 and breath sounds checked- equal and bilateral Tube secured with: Tape Dental Injury: Teeth and Oropharynx as per pre-operative assessment

## 2022-05-29 NOTE — Interval H&P Note (Signed)
History and Physical Interval Note:  05/29/2022 10:49 AM  Neil [name]  has presented today for surgery, with the diagnosis of NEUROGENIC BLADDER.  The various methods of treatment have been discussed with the patient and family. After consideration of risks, benefits and other options for treatment, the patient has consented to  Procedure(s) with comments: [employer] (N/A) - 30 MINS CYSTOSCOPY (N/A) as a surgical intervention.  The patient's history has been reviewed, patient examined, no change in status, stable for surgery.  I have reviewed the patient's chart and labs.  Questions were answered to the patient's satisfaction.     Remi Haggard

## 2022-05-30 ENCOUNTER — Encounter (HOSPITAL_COMMUNITY): Payer: Self-pay | Admitting: Urology

## 2022-05-30 NOTE — Anesthesia Postprocedure Evaluation (Signed)
Anesthesia Post Note  Patient: Neil [name]  Procedure(s) Performed: INSERTION OF SUPRAPUBIC CATHETER CYSTOSCOPY     Patient location during evaluation: PACU Anesthesia Type: General Level of consciousness: awake and alert Pain management: pain level controlled Vital Signs Assessment: post-procedure vital signs reviewed and stable Respiratory status: spontaneous breathing, nonlabored ventilation, respiratory function stable and patient connected to nasal cannula oxygen Cardiovascular status: blood pressure returned to baseline and stable Postop Assessment: no apparent nausea or vomiting Anesthetic complications: no   No notable events documented.  Last Vitals:  Vitals:   05/29/22 1215 05/29/22 1230  BP: (!) 165/78 (!) 185/79  Pulse: (!) 59 (!) 58  Resp: 14 13  Temp:    SpO2: 100% 100%    Last Pain:  Vitals:   05/29/22 1230  TempSrc:   PainSc: 0-No pain   Pain Goal: Patients Stated Pain Goal: 0 (05/29/22 1215)                 Jolly Carlini

## 2022-05-30 NOTE — Progress Notes (Signed)
Not my patient

## 2022-06-04 NOTE — H&P (Signed)
09/26/2021: Seen today as a new patient for evaluation of urinary retention. Patient was hospitalized recently from 11/21 through 11/28 for urinary retention, colitis. Who presented to the emergency department initially with abdominal pain, generalized weakness and confusion that has been persistent for several days as well as painful inability to void. Foley catheter placed in the emergency department. CT imaging showed a markedly enlarged prostate gland mild bilateral hydronephrosis with perinephric and perivesical edema. Imaging also suggestive of sigmoid colitis. He was having diarrhea and blood in the stools. GI consulted and patient placed on a course of Cipro and Flagyl as well as Rowasa. 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 This progressively improved with IV hydration and placement of Foley catheter. With past history of BPH managed previously on terazosin 2 mg from primary care. Finasteride was started during his hospitalization. Voiding trial was attempted during the hospitalization but unsuccessful. During his hospitalization he was found to have delirium and was noted to be pulling and tugging on his catheter tubing. This resulted in some hematuria which is since cleared. Delirium also improved as well.   PMH: diabetes mellitus type 2 with last hemoglobin A1c of 9.4 (December 2022), hypertension, hyperlipidemia as well as other comorbidities including BPH, history of bursitis, CKD stage III, history of colonic polyps, history of CAD. His creatinine on 12/8 was 1.11.   Prior to this he denies having significant bothersome daytime voiding symptoms. Typically he will have nocturia x1. No prior history of UTI/prostatitis. No prior history of kidney stones, GU cancer. He had been previously followed in the 2000's for ED.   Presents today from SNF/rehab facility. Tolerating catheter well, denying any painful or  leaking urgency, gross hematuria. Urine output has been quite appropriate by his report. His bedside bag requires being emptied several times per day. Patient endorsing normal daily bowel movements at this time. His abdominal pain and discomfort have resolved. He has been slowly improving with rehab endorsing increasing strength and better appetite. He has had no recent fevers or chills, nausea/vomiting. Currently mentating at his baseline.  Recent CT imaging does indicate an enlarged prostate contributing to his interval development of urinary retention. Despite this, his voiding symptoms are well managed and controlled with ongoing use of terazosin. His development of urinary retention likely multifactorial especially with worsening underlying diabetes and development of colitis. Also can't exclude possibility of neurogenic bladder at this point but I will continue to manage bladder outlet obstruction before pursuing additional evaluation in the form of urodynamics.   Approximately 300cc of sterile water instilled into the bladder. He was not able to void but did endorse a strong desire to have a bowel movement so he was taken to the toilet to allow that to occur. On recheck of PVR, there was approximately 420 cc in the bladder. A 16 French Foley catheter was replaced to gravity drainage. At this point I am going to have him increase his terazosin dosage from 2 to 5 mg. Prescription provided today. He will also continue finasteride as directed. I plan to have him back in 1 to 2 weeks for repeat exam and trial of void. If unsuccessful at that time, he will be referred to one of the urologist here in clinic for additional evaluation. Consideration for pursuing cystoscopy can be had at that time as well as urodynamics if he remains with ongoing urinary retention.   -10/27/21-patient with history of urinary  retention which occurred during hospitalization for colitis. Has been on finasteride 5 mg daily as well as  terazosin. Failed voiding trial 1 month ago when he saw Jiles Crocker. Here now for follow-up of his urinary retention. Remains at skilled nursing facility.  -11/02/21-patient with history of recent urinary retention during episode of colitis as above. He is on finasteride 5 mg daily and recently added silodosin 4 mg daily. Here for follow-up and voiding trial.  Voiding trial: 240 cc was instilled patient was unable to void. Foley catheter was replaced   -12/15/21-patient with history of urinary retention with failed to void despite being on finasteride and silodosin. Underwent recent urodynamics showing maximum bladder capacity of 320 cc with loss of compliance during the filling phase. Intravesical pressures showed an unstable bladder contraction 63 cm of water at a volume of about 250 cc. Patient was unable to mount a voluntary contraction for voiding.   Cystoscopy was performed today and shows marked bilobar prostatic hypertrophy with approximate 4 cm prostatic urethral length. There is mild median lobe hypertrophy as well but main component is bilobar prostatic hypertrophy. Bladder was somewhat trabeculated without obvious mucosal lesions. Inflammatory response from the catheter was noted. Foley replaced after the procedure.       URODYNAMICS STUDY   Test Indication: Urinary Retention  The procedure's risks, benefits and infection risk were discussed with the patient.   PRE UROFLOW & CATHETERIZATION  Procedure: Pre Uroflow Study  Arrived with a 16 fr foley.  A Urodynamic catheter was inserted. Urine sent for culture.   CYSTOMETRY/CYSTOMETROGRAM  The bladder was filled with room temperature water at a rate of less than 50 cc per minute.  Injection of contrast was performed for the cystometrogram.  Max capacity was approx. 320 mls. First sensation occurred at 192 mls. Normal desire occurred at 216 mls. Strong desire occurred at 250 mls. There was loss of compliance with end filling pressures  of 40-50 cmH20.   The bladder was unstable. Patient a couple unstable contractions on top of the loss of compliance.  First unstable contraction occurred at 250 mls.  Max unstable detrusor contraction was 63 cmH20.  He felt an increased urge at the time but was able to inhibit leaking.   LEAK POINT PRESSURE  LPPs were assessed with pt in a seated position.  No leakage was noted with abdominal pressures of 36 cmH20.   PRESSURE FLOW STUDY  He did not generate a voluntary contraction but tried to void off an unstable contraction.   Max Capacity was 320 mls.   ELECTROMYOGRAM  Activity was measured by surface electrodes  There was no voiding phase.   FLUOROSCOPY and VCUG  Some elevation of the bladder base was noted. No reflux was noted.  Pt confirmed no Neulasta OnPro Device.   POST PROCEDURE ANTIBIOTICS:  He was advised to watch for s/s of a break through UTI and to call our office or go to the ED if he develops any of them. Urine sent for culture.   UDS SUMMARY  Mr. Garrels held a max capacity of approx. 320 mls. His 1st sensation was felt at 192 mls. There was loss of compliance with ending pressures of 40-50 cmH20. There was positive instability on top of this loss of compliance. He felt an increase urge but did not leak He did not generate a voluntary contraction and void. Some elevation of the bladder base was noted. No reflux was noted. A 16 French Foley cathter was reinserted before  the patient left the clinic.  -01/18/22-patient with history of recent urinary retention with urodynamics showing poorly functioning bladder. Here for monthly Foley change. We discussed possibility of suprapubic catheter at last office visit. We rediscussed possibility of putting in a suprapubic catheter but for now he wants to continue with urethral Foley.  -02/15/22-patient with history urinary retention and urodynamics showing poorly functioning bladder. Here for monthly Foley change.   -03/22/22 patient  with history of neurogenic bladder and urinary retention managed with indwelling Foley currently. Here for monthly Foley change. Patient is still not interested in suprapubic catheter.   -04/19/22-patient with history neurogenic bladder and urinary retention managed with indwelling Foley currently. Unfortunately had recent pseudomonal UTI and was seen in the ER with clogged up Foley. He was started on Cipro 5 mg twice daily on 04/08/2022 and had Foley replaced at that time. Since then urine has cleared some but he continues to have some intermittent blood periodically but currently clear.  -05/10/22-patient with history of neurogenic bladder and urinary retention. Has indwelling Foley. Recently treated for pseudomonal UTI back on 04/08/2022. We added methenamine in the interim as he was having problems with Foley clogging up. Here for monthly Foley change and to discuss possibility of suprapubic tube placement. In the interim urine has remained fairly clear since starting the methenamine.     ALLERGIES: Statin Drugs Tizanidine Hcl Zetia TABS    MEDICATIONS: Finasteride 5 mg tablet  Metformin Hcl 500 mg tablet  Metoprolol Succinate 50 mg tablet, extended release 24 hr  Terazosin Hcl 5 mg capsule 1 capsule PO Daily  Terazosin Hcl 2 mg capsule  Acetaminophen 500 mg tablet  Aspirin Ec 81 mg tablet, delayed release  Beano 400 unit tablet  Benazepril Hcl 40 mg tablet  Citalopram Hbr 10 mg tablet  Cyclobenzaprine Hcl 5 mg tablet  Fenofibrate 160 mg tablet  Methenamine Hippurate 1 gram tablet 1 tablet PO BID  Multivitamin  Vitamin B-12  Vitamin D3     GU PSH: Complex cystometrogram, w/ void pressure and urethral pressure profile studies, any technique - 12/12/2021 Complex Uroflow - 12/12/2021 Cystoscopy - 12/15/2021 Emg surf Electrd - 12/12/2021 Inject For cystogram - 12/12/2021 Intrabd voidng Press - 12/12/2021     NON-GU PSH: Cholecystectomy (laparoscopic) Extensive Finger Surgery,  Left Hemorrhoidectomy     GU PMH: Bladder, Neuromuscular dysfunction, Unspec - 04/19/2022, - 03/22/2022, - 02/15/2022, - 01/18/2022, - 12/15/2021 Urinary Retention - 04/19/2022, - 03/22/2022, - 02/15/2022, - 01/18/2022, - 12/15/2021, - 11/02/2021, - 10/27/2021, - 09/26/2021 BPH w/LUTS - 12/15/2021, - 12/12/2021, - 11/02/2021, - 10/27/2021, - 09/26/2021 Chronic Kidney Disease    NON-GU PMH: Coronary Artery Disease Depression Diabetes Type 2 Encounter for general adult medical examination without abnormal findings, Encounter for preventive health examination Hypercholesterolemia    FAMILY HISTORY: No Family History    SOCIAL HISTORY: Marital Status: Widowed Current Smoking Status: Patient does not smoke anymore. Has not smoked since 10/15/2006.   Tobacco Use Assessment Completed: Used Tobacco in last 30 days? Does not use smokeless tobacco. Has never drank.     REVIEW OF SYSTEMS:    GU Review Male:   Patient denies frequent urination, hard to postpone urination, burning/ pain with urination, get up at night to urinate, leakage of urine, stream starts and stops, trouble starting your stream, have to strain to urinate , erection problems, and penile pain.  Gastrointestinal (Upper):   Patient denies nausea, vomiting, and indigestion/ heartburn.  Gastrointestinal (Lower):   Patient denies diarrhea and constipation.  Constitutional:   Patient denies fever, night sweats, weight loss, and fatigue.  Skin:   Patient denies skin rash/ lesion and itching.  Eyes:   Patient denies blurred vision and double vision.  Ears/ Nose/ Throat:   Patient denies sore throat and sinus problems.  Hematologic/Lymphatic:   Patient denies swollen glands and easy bruising.  Cardiovascular:   Patient denies chest pains and leg swelling.  Respiratory:   Patient denies cough and shortness of breath.  Endocrine:   Patient denies excessive thirst.  Musculoskeletal:   Patient denies back pain and joint pain.  Neurological:   Patient denies  headaches and dizziness.  Psychologic:   Patient denies depression and anxiety.   VITAL SIGNS: see preop VS Heart : RRR Lung:CTA [facility]:QPRF, NT NEURo Alert  PAST DATA REVIEW: None   PROCEDURES:         Simple Foley Indwelling Cath Change - 51702  The patient's indwelling foley tube was carefully removed. Patient was cleaned with betadine then inserted lidocaine uroget for numbing. A 16 French Foley catheter was inserted into the bladder using sterile technique. The patient was taught routine catheter care. Hand irrigation of the bladder with sterile water was performed. A leg bag was connected.   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 We discussed possibility of placing suprapubic catheter and he wants to proceed. I discussed risk and benefits procedure in detail including risk of anesthesia risk of injury to bladder bowel or adjacent vascular structures. Patient understands risks and desires to proceed. We will schedule accordingly in the near future.

## 2022-06-10 ENCOUNTER — Other Ambulatory Visit: Payer: Self-pay | Admitting: Internal Medicine

## 2022-06-29 ENCOUNTER — Ambulatory Visit (INDEPENDENT_AMBULATORY_CARE_PROVIDER_SITE_OTHER): Payer: Medicare Other

## 2022-06-29 VITALS — Ht 68.0 in | Wt 153.0 lb

## 2022-06-29 DIAGNOSIS — Z0000 Encounter for general adult medical examination without abnormal findings: Secondary | ICD-10-CM

## 2022-06-29 NOTE — Patient Instructions (Signed)
Mr. Neil [name] , Thank you for taking time to come for your Medicare Wellness Visit. I appreciate your ongoing commitment to your health goals. Please review the following plan we discussed and let me know if I can assist you in the future.   Screening recommendations/referrals: Colonoscopy: no longer required  Recommended yearly ophthalmology/optometry visit for glaucoma screening and checkup Recommended yearly dental visit for hygiene and checkup  Vaccinations: Influenza vaccine: completed  Pneumococcal vaccine: completed  Tdap vaccine: due  Shingles vaccine: will consider    Covid-19: completed   Advanced directives: In Chart   Conditions/risks identified: Aim for 30 minutes of exercise or brisk walking, 6-8 glasses of water, and 5 servings of fruits and vegetables each day.   Next appointment: Follow up in one year for your annual wellness visit.   Preventive Care 41 Years and Older, Male  Preventive care refers to lifestyle choices and visits with your health care provider that can promote health and wellness. What does preventive care include? A yearly physical exam. This is also called an annual well check. Dental exams once or twice a year. Routine eye exams. Ask your health care provider how often you should have your eyes checked. Personal lifestyle choices, including: Daily care of your teeth and gums. Regular physical activity. Eating a healthy diet. Avoiding tobacco and drug use. Limiting alcohol use. Practicing safe sex. Taking low doses of aspirin every day. Taking vitamin and mineral supplements as recommended by your health care provider. What happens during an annual well check? The services and screenings done by your health care provider during your annual well check will depend on your age, overall health, lifestyle risk factors, and family history of disease. Counseling  Your health care provider may ask you questions about your: Alcohol use. Tobacco  use. Drug use. Emotional well-being. Home and relationship well-being. Sexual activity. Eating habits. History of falls. Memory and ability to understand (cognition). Work and work Statistician. Screening  You may have the following tests or measurements: Height, weight, and BMI. Blood pressure. Lipid and cholesterol levels. These may be checked every 5 years, or more frequently if you are over 71 years old. Skin check. Lung cancer screening. You may have this screening every year starting at age 75 if you have a 30-pack-year history of smoking and currently smoke or have quit within the past 15 years. Fecal occult blood test (FOBT) of the stool. You may have this test every year starting at age 66. Flexible sigmoidoscopy or colonoscopy. You may have a sigmoidoscopy every 5 years or a colonoscopy every 10 years starting at age 18. Prostate cancer screening. Recommendations will vary depending on your family history and other risks. Hepatitis C blood test. Hepatitis B blood test. Sexually transmitted disease (STD) testing. Diabetes screening. This is done by checking your blood sugar (glucose) after you have not eaten for a while (fasting). You may have this done every 1-3 years. Abdominal aortic aneurysm (AAA) screening. You may need this if you are a current or former smoker. Osteoporosis. You may be screened starting at age 54 if you are at high risk. Talk with your health care provider about your test results, treatment options, and if necessary, the need for more tests. Vaccines  Your health care provider may recommend certain vaccines, such as: Influenza vaccine. This is recommended every year. Tetanus, diphtheria, and acellular pertussis (Tdap, Td) vaccine. You may need a Td booster every 10 years. Zoster vaccine. You may need this after age 26. Pneumococcal  13-valent conjugate (PCV13) vaccine. One dose is recommended after age 49. Pneumococcal polysaccharide (PPSV23) vaccine.  One dose is recommended after age 44. Talk to your health care provider about which screenings and vaccines you need and how often you need them. This information is not intended to replace advice given to you by your health care provider. Make sure you discuss any questions you have with your health care provider. Document Released: 10/28/2015 Document Revised: 06/20/2016 Document Reviewed: 08/02/2015 Elsevier Interactive Patient Education  2017 [place] Prevention in the Home Falls can cause injuries. They can happen to people of all ages. There are many things you can do to make your home safe and to help prevent falls. What can I do on the outside of my home? Regularly fix the edges of walkways and driveways and fix any cracks. Remove anything that might make you trip as you walk through a door, such as a raised step or threshold. Trim any bushes or trees on the path to your home. Use bright outdoor lighting. Clear any walking paths of anything that might make someone trip, such as rocks or tools. Regularly check to see if handrails are loose or broken. Make sure that both sides of any steps have handrails. Any raised decks and porches should have guardrails on the edges. Have any leaves, snow, or ice cleared regularly. Use sand or salt on walking paths during winter. Clean up any spills in your garage right away. This includes oil or grease spills. What can I do in the bathroom? Use night lights. Install grab bars by the toilet and in the tub and shower. Do not use towel bars as grab bars. Use non-skid mats or decals in the tub or shower. If you need to sit down in the shower, use a plastic, non-slip stool. Keep the floor dry. Clean up any water that spills on the floor as soon as it happens. Remove soap buildup in the tub or shower regularly. Attach bath mats securely with double-sided non-slip rug tape. Do not have throw rugs and other things on the floor that can make  you trip. What can I do in the bedroom? Use night lights. Make sure that you have a light by your bed that is easy to reach. Do not use any sheets or blankets that are too big for your bed. They should not hang down onto the floor. Have a firm chair that has side arms. You can use this for support while you get dressed. Do not have throw rugs and other things on the floor that can make you trip. What can I do in the kitchen? Clean up any spills right away. Avoid walking on wet floors. Keep items that you use a lot in easy-to-reach places. If you need to reach something above you, use a strong step stool that has a grab bar. Keep electrical cords out of the way. Do not use floor polish or wax that makes floors slippery. If you must use wax, use non-skid floor wax. Do not have throw rugs and other things on the floor that can make you trip. What can I do with my stairs? Do not leave any items on the stairs. Make sure that there are handrails on both sides of the stairs and use them. Fix handrails that are broken or loose. Make sure that handrails are as long as the stairways. Check any carpeting to make sure that it is firmly attached to the stairs. Fix any carpet  that is loose or worn. Avoid having throw rugs at the top or bottom of the stairs. If you do have throw rugs, attach them to the floor with carpet tape. Make sure that you have a light switch at the top of the stairs and the bottom of the stairs. If you do not have them, ask someone to add them for you. What else can I do to help prevent falls? Wear shoes that: Do not have high heels. Have rubber bottoms. Are comfortable and fit you well. Are closed at the toe. Do not wear sandals. If you use a stepladder: Make sure that it is fully opened. Do not climb a closed stepladder. Make sure that both sides of the stepladder are locked into place. Ask someone to hold it for you, if possible. Clearly mark and make sure that you can  see: Any grab bars or handrails. First and last steps. Where the edge of each step is. Use tools that help you move around (mobility aids) if they are needed. These include: Canes. Walkers. Scooters. Crutches. Turn on the lights when you go into a dark area. Replace any light bulbs as soon as they burn out. Set up your furniture so you have a clear path. Avoid moving your furniture around. If any of your floors are uneven, fix them. If there are any pets around you, be aware of where they are. Review your medicines with your doctor. Some medicines can make you feel dizzy. This can increase your chance of falling. Ask your doctor what other things that you can do to help prevent falls. This information is not intended to replace advice given to you by your health care provider. Make sure you discuss any questions you have with your health care provider. Document Released: 07/28/2009 Document Revised: 03/08/2016 Document Reviewed: 11/05/2014 Elsevier Interactive Patient Education  2017 [employer].

## 2022-06-29 NOTE — Progress Notes (Signed)
Subjective:   Neil [name] is a 81 y.o. male who presents for Medicare Annual/Subsequent preventive examination.   Virtual Visit via Telephone Note  I connected with  Neil [name] on 06/29/22 at 10:15 AM EDT by telephone and verified that I am speaking with the correct person using two identifiers.  Location: Patient: home  Provider: [facility]  Persons participating in the virtual visit: patient/Nurse Health Advisor   I discussed the limitations, risks, security and privacy concerns of performing an evaluation and management service by telephone and the availability of in person appointments. The patient expressed understanding and agreed to proceed.  Interactive audio and video telecommunications were attempted between this nurse and patient, however failed, due to patient having technical difficulties OR patient did not have access to video capability.  We continued and completed visit with audio only.  Some vital signs may be absent or patient reported.   Daphane Shepherd, LPN  Review of Systems     Cardiac Risk Factors include: advanced age (>80mn, >>56women);diabetes mellitus;dyslipidemia;hypertension;male gender     Objective:    Today's Vitals   06/29/22 1037  Weight: 153 lb (69.4 kg)  Height: _0  (1.727 m)   Body mass index is 23.26 kg/m.     06/29/2022   10:46 AM 05/28/2022   10:09 AM 04/08/2022    9:42 AM 03/26/2022    1:07 AM 10/02/2021    1:13 AM 09/04/2021    9:14 PM 06/21/2021    6:21 PM  Advanced Directives  Does Patient Have a Medical Advance Directive? Yes No No Yes Yes Yes Yes  Type of AParamed[place]of [place]ier[place]Living will   Living will Healthcare Power of [place] will Living will  Does patient want to make changes to medical advance directive? No - Patient declined    No - Patient declined No - Patient declined No - Patient declined  Copy of [place]?  Yes - validated most recent copy scanned in chart (See row information)     No - copy requested   Would patient like information on creating a medical advance directive?   No - Patient declined        Current Medications (verified) Outpatient Encounter Medications as of 06/29/2022  Medication Sig   acetaminophen (TYLENOL) 500 MG tablet Take 1,500 mg by mouth daily as needed for mild pain.   Alpha-D-Galactosidase (BEANO PO) Take 1 tablet by mouth at bedtime.   aspirin 81 MG EC tablet Take 81 mg by mouth daily.   benazepril (LOTENSIN) 40 MG tablet TAKE 1 TABLET BY MOUTH DAILY   Cholecalciferol (VITAMIN D) 50 MCG (2000 UT) CAPS Take 2,000 Units by mouth at bedtime.   citalopram (CELEXA) 10 MG tablet TAKE 1 TABLET BY MOUTH  DAILY (Patient taking differently: Take 10 mg by mouth every evening.)   cyclobenzaprine (FLEXERIL) 5 MG tablet TAKE 1 TABLET BY MOUTH 3  TIMES DAILY AS NEEDED FOR  MUSCLE SPASM(S)   docusate sodium (COLACE) 100 MG capsule Take 100 mg by mouth daily.   fenofibrate 160 MG tablet TAKE 1 TABLET BY MOUTH AT  BEDTIME   finasteride (PROSCAR) 5 MG tablet Take 1 tablet (5 mg total) by mouth daily.   ibuprofen (ADVIL) 200 MG tablet Take 600 mg by mouth 2 (two) times daily as needed for moderate pain.   metFORMIN (GLUCOPHAGE-XR) 500 MG 24 hr tablet TAKE 1 TABLET BY MOUTH  DAILY WITH BREAKFAST (Patient taking differently: Take 500 mg by mouth every evening.)   methenamine (HIPREX) 1 g tablet Take 1 g by mouth 2 (two) times daily.   metoprolol succinate (TOPROL-XL) 50 MG 24 hr tablet Take 1 tablet (50 mg total) by mouth daily. Take with or immediately following a meal.   Multiple Vitamin (MULTIVITAMIN WITH MINERALS) TABS tablet Take 1 tablet by mouth daily.   silodosin (RAPAFLO) 4 MG CAPS capsule Take 1 capsule (4 mg total) by mouth daily with breakfast.   sodium chloride (OCEAN) 0.65 % nasal spray Place 1 spray into the nose daily as needed for congestion.    terazosin (HYTRIN) 2 MG  capsule Take 1 capsule (2 mg total) by mouth at bedtime.   traZODone (DESYREL) 50 MG tablet Take 0.5-1 tablets (25-50 mg total) by mouth at bedtime as needed for sleep. (Patient taking differently: Take 50 mg by mouth at bedtime.)   ciprofloxacin (CIPRO) 500 MG tablet Take 1 tablet (500 mg total) by mouth 2 (two) times daily.   traMADol (ULTRAM) 50 MG tablet Take 1 tablet (50 mg total) by mouth every 6 (six) hours as needed.   No facility-administered encounter medications on file as of 06/29/2022.    Allergies (verified) Statins, Zanaflex [tizanidine], and Zetia [ezetimibe]   History: Past Medical History:  Diagnosis Date   ALLERGIC RHINITIS 12/04/2007   Anxiety    BENIGN PROSTATIC HYPERTROPHY 06/05/2007   BUNDLE BRANCH BLOCK, RIGHT 07/21/2009   BURSITIS, RIGHT SHOULDER 11/06/2010   Choledocholithiasis    CKD (chronic kidney disease) stage 3, GFR 30-59 ml/min (HCC) 08/02/2017   COLONIC POLYPS, HX OF 06/05/2007   tubular adenoma also 02/19/2013   Coronary artery calcification seen on CT scan 03/07/2016   Depression    DIABETES MELLITUS, TYPE [name] 12/04/2007   DIVERTICULOSIS, COLON 07/21/2009   ERECTILE DYSFUNCTION 06/05/2007   Gallstones    HYPERLIPIDEMIA 06/05/2007   on meds   HYPERTENSION 06/05/2007   on meds   Hypotension    INGUINAL HERNIA, RIGHT, SMALL 11/06/2010   Liver abscess 05/23/2015   SKIN LESION 06/03/2008   Past Surgical History:  Procedure Laterality Date   BIOPSY  09/08/2021   Procedure: BIOPSY;  Surgeon: Thornton Park, MD;  Location: [facility];  Service: Endoscopy;;   CHOLECYSTECTOMY N/A 03/02/2015   Procedure: LAPAROSCOPIC CHOLECYSTECTOMY;  Surgeon: Fanny Skates, MD;  Location: [facility];  Service: General;  Laterality: N/A;   COLONOSCOPY  2017   JP-MAC-suprep (good)-TA   CYSTOSCOPY N/A 05/29/2022   Procedure: Consuela Mimes;  Surgeon: Remi Haggard, MD;  Location: [facility];  Service: Urology;  Laterality: N/A;   ERCP N/A 03/01/2015   Procedure:  ENDOSCOPIC RETROGRADE CHOLANGIOPANCREATOGRAPHY (ERCP);  Surgeon: Irene Shipper, MD;  Location: [facility];  Service: Endoscopy;  Laterality: N/A;  Dr Dalbert Batman wants patient to stay overnight after ERCP   ERCP N/A 05/03/2015   Procedure: ENDOSCOPIC RETROGRADE CHOLANGIOPANCREATOGRAPHY (ERCP);  Surgeon: Irene Shipper, MD;  Location: [facility];  Service: Endoscopy;  Laterality: N/A;   FINGER SURGERY Left    "little finger"   FLEXIBLE SIGMOIDOSCOPY N/A 09/08/2021   Procedure: FLEXIBLE SIGMOIDOSCOPY;  Surgeon: Thornton Park, MD;  Location: [facility];  Service: Endoscopy;  Laterality: N/A;   Orient   INGUINAL HERNIA REPAIR Right 2014   8'14 repair   INSERTION OF SUPRAPUBIC CATHETER N/A 05/29/2022   Procedure: INSERTION OF SUPRAPUBIC CATHETER;  Surgeon: Remi Haggard, MD;  Location: [facility];  Service: Urology;  Laterality: N/A;  13 MINS   POLYPECTOMY     TONSILLECTOMY     WISDOM TOOTH EXTRACTION  1972   Family History  Problem Relation Age of Onset   Colon polyps Mother 87   Colon cancer Mother 53   Brain cancer Mother 39       mets from colon cancer   Colon cancer Paternal Uncle    Colon polyps Paternal Uncle    Rectal cancer Neg Hx    Stomach cancer Neg Hx    Esophageal cancer Neg Hx    Social History   Socioeconomic History   Marital status: Widowed    Spouse name: Not on file   Number of children: Not on file   Years of education: Not on file   Highest education level: Not on file  Occupational History   Occupation: retired  Tobacco Use   Smoking status: Former    Types: Cigarettes    Quit date: 2008    Years since quitting: 15.7   Smokeless tobacco: Never  Vaping Use   Vaping Use: Never used  Substance and Sexual Activity   Alcohol use: Not Currently    Comment: occ beer but not in years   Drug use: No   Sexual activity: Not Currently  Other Topics Concern   Not on file  Social History Narrative   Not on file   Social Determinants of  Health   Financial Resource Strain: [place]  (06/29/2022)   Overall Financial Resource Strain (CARDIA)    Difficulty of Paying Living Expenses: Not hard at all  Food Insecurity: No Food Insecurity (06/29/2022)   Hunger Vital Sign    Worried About Running Out of Food in the Last Year: Never true    [place] in the Last Year: Never true  Transportation Needs: No Transportation Needs (06/29/2022)   PRAPARE - Hydrologist (Medical): No    Lack of Transportation (Non-Medical): No  Physical Activity: Insufficiently Active (06/29/2022)   Exercise Vital Sign    Days of Exercise per Week: 3 days    Minutes of Exercise per Session: 30 min  Stress: No Stress Concern Present (06/29/2022)   [place]    Feeling of Stress : Not at all  Social Connections: Moderately Isolated (06/29/2022)   Social Connection and Isolation Panel [NHANES]    Frequency of Communication with Friends and Family: More than three times a week    Frequency of Social Gatherings with Friends and Family: More than three times a week    Attends Religious Services: More than 4 times per year    Active Member of [employer] or Organizations: No    Attends Archivist Meetings: Never    Marital Status: Widowed    Tobacco Counseling Counseling given: Not Answered   Clinical Intake:  Pre-visit preparation completed: Yes  Pain : No/denies pain     Nutritional Risks: None Diabetes: No  How often do you need to have someone help you when you read instructions, pamphlets, or other written materials from your doctor or pharmacy?: 1 - Never  Diabetic?yes  Nutrition Risk Assessment:  Has the patient had any N/V/D within the last 2 months?  No  Does the patient have any non-healing wounds?  No  Has the patient had any unintentional weight loss or weight gain?  No   Diabetes:  Is the patient diabetic?  Yes  If  diabetic, was a  CBG obtained today?  No  Did the patient bring in their glucometer from home?  No  How often do you monitor your CBG's? Never .   Financial Strains and Diabetes Management:  Are you having any financial strains with the device, your supplies or your medication? No .  Does the patient want to be seen by Chronic Care Management for management of their diabetes?  No  Would the patient like to be referred to a Nutritionist or for Diabetic Management?  No   Diabetic Exams:  Diabetic Eye Exam: Completed 05/2022 Diabetic Foot Exam: Overdue, Pt has been advised about the importance in completing this exam. Pt is scheduled for diabetic foot exam on next office visit .   Interpreter Needed?: No  Information entered by :: Jadene Pierini, LPN   Activities of Daily Living    06/29/2022   10:46 AM 06/29/2022   10:44 AM  In your present state of health, do you have any difficulty performing the following activities:  Hearing? 0 0  Vision? 0 0  Difficulty concentrating or making decisions? 0 0  Walking or climbing stairs? 0 0  Dressing or bathing? 0 0  Doing errands, shopping? 0 0  Preparing Food and eating ? N N  Using the Toilet? N N  In the past six months, have you accidently leaked urine? N N  Do you have problems with loss of bowel control? N N  Managing your Medications? N N  Managing your Finances? N N  Housekeeping or managing your Housekeeping? N N    Patient Care Team: Biagio Borg, MD as PCP - Gwenevere Abbot, Docia Chuck, MD as Consulting Physician (Gastroenterology)  Indicate any recent Medical Services you may have received from other than [facility] providers in the past year (date may be approximate).     Assessment:   This is a routine wellness examination for [place].  Hearing/Vision screen Vision Screening - Comments:: Annual eye exams wear glasses   Dietary issues and exercise activities discussed: Current Exercise Habits: The patient does not participate in  regular exercise at present, Exercise limited by: None identified   Goals Addressed             This Visit's Progress    Patient Stated   On track    Maintain my current health status.        Depression Screen    06/29/2022   10:45 AM 01/10/2022   11:28 AM 06/21/2021    6:12 PM 05/25/2021    3:03 PM 05/24/2020    3:25 PM 11/25/2019    3:53 PM 05/25/2019    3:18 PM  PHQ 2/9 Scores  PHQ - 2 Score 0 0 _0 PHQ- 9 Score   _1 Fall Risk    06/29/2022   10:40 AM 01/10/2022   11:28 AM 06/21/2021    6:12 PM 05/25/2021    3:03 PM 05/24/2020    3:25 PM  Fall Risk   Falls in the past year? 1 1 0 0 0  Number falls in past yr: 1 1 0 0 0  Injury with Fall? 1 0 0 0 0  Risk for fall due to : History of fall(s);Impaired balance/gait;Orthopedic patient    No Fall Risks  Follow up Education provided;Falls prevention discussed    Falls evaluation completed    FALL RISK PREVENTION PERTAINING TO THE HOME:  Any stairs in or around the home?  No  If so, are there any without handrails? No  Home free of loose throw rugs in walkways, pet beds, electrical cords, etc? Yes  Adequate lighting in your home to reduce risk of falls? Yes   ASSISTIVE DEVICES UTILIZED TO PREVENT FALLS:  Life alert? No  Use of a cane, walker or w/c? Yes  Grab bars in the bathroom? Yes  Shower chair or bench in shower? Yes  Elevated toilet seat or a handicapped toilet? Yes      06/29/2022   10:47 AM 06/21/2021    6:19 PM  6CIT Screen  What Year? 0 points 0 points  What month? 0 points 0 points  What time? 0 points 0 points  Count back from 20 0 points 0 points  Months in reverse 0 points 0 points  Repeat phrase 2 points 0 points  Total Score 2 points 0 points    Immunizations Immunization History  Administered Date(s) Administered   Fluad Quad(high Dose 65+) 06/05/2019, 07/07/2020   Influenza Split 07/04/2012   Influenza Whole 07/21/2009, 11/06/2010   Influenza, High Dose Seasonal PF 07/03/2016,  08/02/2017, 07/10/2018   Influenza,inj,Quad PF,6+ Mos 06/24/2013, 06/25/2014, 07/12/2015   PFIZER(Purple Top)SARS-COV-2 Vaccination 10/27/2019, 11/16/2019, 07/10/2020, 01/19/2021   Pfizer Covid-19 Vaccine Bivalent Booster 58yr & up 07/11/2021, 02/14/2022   Pneumococcal Conjugate-13 03/17/2014   Pneumococcal Polysaccharide-23 11/14/2011   Td 07/21/2009    TDAP status: Due, Education has been provided regarding the importance of this vaccine. Advised may receive this vaccine at local pharmacy or Health Dept. Aware to provide a copy of the vaccination record if obtained from local pharmacy or Health Dept. Verbalized acceptance and understanding.  Flu Vaccine status: Up to date  Pneumococcal vaccine status: Up to date  Covid-19 vaccine status: Completed vaccines  Qualifies for Shingles Vaccine? Yes   Zostavax completed No   Shingrix Completed?: No.    Education has been provided regarding the importance of this vaccine. Patient has been advised to call insurance company to determine out of pocket expense if they have not yet received this vaccine. Advised may also receive vaccine at local pharmacy or Health Dept. Verbalized acceptance and understanding.  Screening Tests Health Maintenance  Topic Date Due   Zoster Vaccines- Shingrix (2 of 2) 03/13/2022   OPHTHALMOLOGY EXAM  04/14/2022   HEMOGLOBIN A1C  11/28/2022   Diabetic kidney evaluation - Urine ACR  01/04/2023   FOOT EXAM  01/11/2023   Diabetic kidney evaluation - GFR measurement  05/29/2023   COLONOSCOPY (Pts 45-434yrInsurance coverage will need to be confirmed)  07/06/2026   TETANUS/TDAP  01/17/2032   Pneumonia Vaccine 6556Years old  Completed   INFLUENZA VACCINE  Completed   COVID-19 Vaccine  Completed   HPV VACCINES  Aged Out    Health Maintenance  Health Maintenance Due  Topic Date Due   Zoster Vaccines- Shingrix (2 of 2) 03/13/2022   OPHTHALMOLOGY EXAM  04/14/2022    Colorectal cancer screening: No longer  required.   Lung Cancer Screening: (Low Dose CT Chest recommended if Age 29102-80ears, 30 pack-year currently smoking OR have quit w/in 15years.) does not qualify.   Lung Cancer Screening Referral: n/a  Additional Screening:  Hepatitis C Screening: does not qualify;   Vision Screening: Recommended annual ophthalmology exams for early detection of glaucoma and other disorders of the eye. Is the patient up to date with their annual eye exam?  Yes  Who is the provider or what is the name of the office in  which the patient attends annual eye exams? Dr.Lee  If pt is not established with a provider, would they like to be referred to a provider to establish care? No .   Dental Screening: Recommended annual dental exams for proper oral hygiene  Community Resource Referral / Chronic Care Management: CRR required this visit?  No   CCM required this visit?  No      Plan:     I have personally reviewed and noted the following in the patient's chart:   Medical and social history Use of alcohol, tobacco or illicit drugs  Current medications and supplements including opioid prescriptions. Patient is not currently taking opioid prescriptions. Functional ability and status Nutritional status Physical activity Advanced directives List of other physicians Hospitalizations, surgeries, and ER visits in previous 12 months Vitals Screenings to include cognitive, depression, and falls Referrals and appointments  In addition, I have reviewed and discussed with patient certain preventive protocols, quality metrics, and best practice recommendations. A written personalized care plan for preventive services as well as general preventive health recommendations were provided to patient.     Daphane Shepherd, LPN   [id number]   Nurse Notes: none

## 2022-07-06 ENCOUNTER — Other Ambulatory Visit (INDEPENDENT_AMBULATORY_CARE_PROVIDER_SITE_OTHER): Payer: Medicare Other

## 2022-07-06 DIAGNOSIS — E559 Vitamin D deficiency, unspecified: Secondary | ICD-10-CM

## 2022-07-06 DIAGNOSIS — E538 Deficiency of other specified B group vitamins: Secondary | ICD-10-CM

## 2022-07-06 DIAGNOSIS — E1165 Type 2 diabetes mellitus with hyperglycemia: Secondary | ICD-10-CM | POA: Diagnosis not present

## 2022-07-06 LAB — LIPID PANEL
Cholesterol: 172 mg/dL (ref 0–200)
HDL: 43 mg/dL (ref >39.00)
LDL Cholesterol: 105 mg/dL — ABNORMAL HIGH (ref 0–99)
NonHDL: 128.51
Total CHOL/HDL Ratio: 4
Triglycerides: 117 mg/dL (ref 0.0–149.0)
VLDL: 23.4 mg/dL (ref 0.0–40.0)

## 2022-07-06 LAB — BASIC METABOLIC PANEL
BUN: 19 mg/dL (ref 6–23)
CO2: 28 mEq/L (ref 19–32)
Calcium: 9.5 mg/dL (ref 8.4–10.5)
Chloride: 105 mEq/L (ref 96–112)
Creatinine, Ser: 1.3 mg/dL (ref 0.40–1.50)
GFR: 52 mL/min — ABNORMAL LOW (ref >60.00)
Glucose, Bld: 138 mg/dL — ABNORMAL HIGH (ref 70–99)
Potassium: 3.7 mEq/L (ref 3.5–5.1)
Sodium: 142 mEq/L (ref 135–145)

## 2022-07-06 LAB — VITAMIN B12: Vitamin B-12: 686 pg/mL (ref 211–911)

## 2022-07-06 LAB — HEPATIC FUNCTION PANEL
ALT: 11 U/L (ref 0–53)
AST: 15 U/L (ref 0–37)
Albumin: 3.9 g/dL (ref 3.5–5.2)
Alkaline Phosphatase: 28 U/L — ABNORMAL LOW (ref 39–117)
Bilirubin, Direct: 0.1 mg/dL (ref 0.0–0.3)
Total Bilirubin: 0.4 mg/dL (ref 0.2–1.2)
Total Protein: 7 g/dL (ref 6.0–8.3)

## 2022-07-06 LAB — VITAMIN D 25 HYDROXY (VIT D DEFICIENCY, FRACTURES): VITD: 51.68 ng/mL (ref 30.00–100.00)

## 2022-07-09 ENCOUNTER — Other Ambulatory Visit: Payer: Self-pay | Admitting: Internal Medicine

## 2022-07-09 LAB — HEMOGLOBIN A1C: Hgb A1c MFr Bld: 6.6 % — ABNORMAL HIGH (ref 4.6–6.5)

## 2022-07-12 DIAGNOSIS — N319 Neuromuscular dysfunction of bladder, unspecified: Secondary | ICD-10-CM | POA: Diagnosis not present

## 2022-07-13 ENCOUNTER — Ambulatory Visit (INDEPENDENT_AMBULATORY_CARE_PROVIDER_SITE_OTHER): Payer: Medicare Other | Admitting: Internal Medicine

## 2022-07-13 VITALS — BP 140/68 | HR 64 | Temp 98.0°F | Ht 68.0 in | Wt 156.0 lb

## 2022-07-13 DIAGNOSIS — I10 Essential (primary) hypertension: Secondary | ICD-10-CM

## 2022-07-13 DIAGNOSIS — E782 Mixed hyperlipidemia: Secondary | ICD-10-CM

## 2022-07-13 DIAGNOSIS — G8929 Other chronic pain: Secondary | ICD-10-CM | POA: Diagnosis not present

## 2022-07-13 DIAGNOSIS — M25512 Pain in left shoulder: Secondary | ICD-10-CM | POA: Diagnosis not present

## 2022-07-13 DIAGNOSIS — E1165 Type 2 diabetes mellitus with hyperglycemia: Secondary | ICD-10-CM

## 2022-07-13 DIAGNOSIS — E559 Vitamin D deficiency, unspecified: Secondary | ICD-10-CM | POA: Diagnosis not present

## 2022-07-13 DIAGNOSIS — E538 Deficiency of other specified B group vitamins: Secondary | ICD-10-CM | POA: Diagnosis not present

## 2022-07-13 MED ORDER — EZETIMIBE 10 MG PO TABS: 10.0000 mg | ORAL_TABLET | Freq: Every day | ORAL | 3 refills | Status: DC

## 2022-07-13 NOTE — Progress Notes (Unsigned)
Patient ID: Neil [name], male   DOB: [birth year], 81 y.o.   MRN: [id number]        Chief Complaint: follow up HTN, HLD and hyperglycemia, left shoulder pain       HPI:  Neil [name] is a 81 y.o. male here overall doing ok, trying to follow lower chol diet.  Pt denies chest pain, increased sob or doe, wheezing, orthopnea, PND, increased LE swelling, palpitations, dizziness or syncope.   Pt denies polydipsia, polyuria, or new focal neuro s/s.    Pt denies fever, wt loss, night sweats, loss of appetite, or other constitutional symptoms  Had recent flu shot, RSV, and covid booster at the pharmacy.  Does have ongoing mild left shoulder with reduced ROM to about 100 degrees only, since a fall about 6 mo ago.    Wt Readings from Last 3 Encounters:  07/13/22 156 lb (70.8 kg)  06/29/22 153 lb (69.4 kg)  05/28/22 157 lb (71.2 kg)   BP Readings from Last 3 Encounters:  07/13/22 (!) 140/68  05/29/22 (!) 185/79  05/28/22 (!) 157/76         Past Medical History:  Diagnosis Date   ALLERGIC RHINITIS 12/04/2007   Anxiety    BENIGN PROSTATIC HYPERTROPHY 06/05/2007   BUNDLE BRANCH BLOCK, RIGHT 07/21/2009   BURSITIS, RIGHT SHOULDER 11/06/2010   Choledocholithiasis    CKD (chronic kidney disease) stage 3, GFR 30-59 ml/min (HCC) 08/02/2017   COLONIC POLYPS, HX OF 06/05/2007   tubular adenoma also 02/19/2013   Coronary artery calcification seen on CT scan 03/07/2016   Depression    DIABETES MELLITUS, TYPE [name] 12/04/2007   DIVERTICULOSIS, COLON 07/21/2009   ERECTILE DYSFUNCTION 06/05/2007   Gallstones    HYPERLIPIDEMIA 06/05/2007   on meds   HYPERTENSION 06/05/2007   on meds   Hypotension    INGUINAL HERNIA, RIGHT, SMALL 11/06/2010   Liver abscess 05/23/2015   SKIN LESION 06/03/2008   Past Surgical History:  Procedure Laterality Date   BIOPSY  09/08/2021   Procedure: BIOPSY;  Surgeon: Thornton Park, MD;  Location: [facility];  Service: Endoscopy;;   CHOLECYSTECTOMY N/A 03/02/2015    Procedure: LAPAROSCOPIC CHOLECYSTECTOMY;  Surgeon: Fanny Skates, MD;  Location: [facility];  Service: General;  Laterality: N/A;   COLONOSCOPY  2017   JP-MAC-suprep (good)-TA   CYSTOSCOPY N/A 05/29/2022   Procedure: Consuela Mimes;  Surgeon: Remi Haggard, MD;  Location: [facility];  Service: Urology;  Laterality: N/A;   ERCP N/A 03/01/2015   Procedure: ENDOSCOPIC RETROGRADE CHOLANGIOPANCREATOGRAPHY (ERCP);  Surgeon: Irene Shipper, MD;  Location: [facility];  Service: Endoscopy;  Laterality: N/A;  Dr Dalbert Batman wants patient to stay overnight after ERCP   ERCP N/A 05/03/2015   Procedure: ENDOSCOPIC RETROGRADE CHOLANGIOPANCREATOGRAPHY (ERCP);  Surgeon: Irene Shipper, MD;  Location: [facility];  Service: Endoscopy;  Laterality: N/A;   FINGER SURGERY Left    "little finger"   FLEXIBLE SIGMOIDOSCOPY N/A 09/08/2021   Procedure: FLEXIBLE SIGMOIDOSCOPY;  Surgeon: Thornton Park, MD;  [facility]: [facility];  Service: Endoscopy;  Laterality: N/A;   [place]   INGUINAL HERNIA REPAIR Right 2014   8'14 repair   INSERTION OF SUPRAPUBIC CATHETER N/A 05/29/2022   Procedure: INSERTION OF SUPRAPUBIC CATHETER;  Surgeon: Remi Haggard, MD;  Location: [facility];  Service: Urology;  Laterality: N/A;  [place]    reports that he  quit smoking about 15 years ago. His smoking use included cigarettes. He has never used smokeless tobacco. He reports that he does not currently use alcohol. He reports that he does not use drugs. family history includes Brain cancer (age of onset: 64) in his mother; Colon cancer in his paternal uncle; Colon cancer (age of onset: 62) in his mother; Colon polyps in his paternal uncle; Colon polyps (age of onset: 35) in his mother. Allergies  Allergen Reactions   Statins Other (See Comments)    Joint pain   Zanaflex [Tizanidine] Other (See Comments)    Dizzy, sleepy   Zetia [Ezetimibe] Other (See Comments)     Pt unsure of allergy    Current Outpatient Medications on File Prior to Visit  Medication Sig Dispense Refill   acetaminophen (TYLENOL) 500 MG tablet Take 1,500 mg by mouth daily as needed for mild pain.     Alpha-D-Galactosidase (BEANO PO) Take 1 tablet by mouth at bedtime.     aspirin 81 MG EC tablet Take 81 mg by mouth daily.     benazepril (LOTENSIN) 40 MG tablet TAKE 1 TABLET BY MOUTH DAILY 90 tablet 1   Cholecalciferol (VITAMIN D) 50 MCG (2000 UT) CAPS Take 2,000 Units by mouth at bedtime.     citalopram (CELEXA) 10 MG tablet TAKE 1 TABLET BY MOUTH  DAILY (Patient taking differently: Take 10 mg by mouth every evening.) 90 tablet 2   cyclobenzaprine (FLEXERIL) 5 MG tablet TAKE 1 TABLET BY MOUTH 3  TIMES DAILY AS NEEDED FOR  MUSCLE SPASM(S) 270 tablet 1   docusate sodium (COLACE) 100 MG capsule Take 100 mg by mouth daily.     fenofibrate 160 MG tablet TAKE 1 TABLET BY MOUTH AT  BEDTIME 90 tablet 1   finasteride (PROSCAR) 5 MG tablet Take 1 tablet (5 mg total) by mouth daily. 90 tablet 3   ibuprofen (ADVIL) 200 MG tablet Take 600 mg by mouth 2 (two) times daily as needed for moderate pain.     metFORMIN (GLUCOPHAGE-XR) 500 MG 24 hr tablet TAKE 1 TABLET BY MOUTH  DAILY WITH BREAKFAST (Patient taking differently: Take 500 mg by mouth every evening.) 100 tablet 2   methenamine (HIPREX) 1 g tablet Take 1 g by mouth 2 (two) times daily.     metoprolol succinate (TOPROL-XL) 50 MG 24 hr tablet Take 1 tablet (50 mg total) by mouth daily. Take with or immediately following a meal. 90 tablet 3   Multiple Vitamin (MULTIVITAMIN WITH MINERALS) TABS tablet Take 1 tablet by mouth daily. 30 tablet 0   silodosin (RAPAFLO) 4 MG CAPS capsule Take 1 capsule (4 mg total) by mouth daily with breakfast. 90 capsule 3   sodium chloride (OCEAN) 0.65 % nasal spray Place 1 spray into the nose daily as needed for congestion.      terazosin (HYTRIN) 2 MG capsule Take 1 capsule (2 mg total) by mouth at bedtime. 90  capsule 3   traZODone (DESYREL) 50 MG tablet TAKE 1/2 TO 1 TABLET BY MOUTH AT BEDTIME AS NEEDED FOR SLEEP 90 tablet 1   No current facility-administered medications on file prior to visit.        ROS:  All others reviewed and negative.  Objective        PE:  BP (!) 140/68 (BP Location: Left Arm, Patient Position: Sitting, Cuff Size: Large)   Pulse 64   Temp 98 F (36.7 C) (Oral)   Ht _0  (1.727 m)  Wt 156 lb (70.8 kg)   SpO2 97%   BMI 23.72 kg/m                 Constitutional: Pt appears in NAD               HENT: Head: NCAT.                Right Ear: External ear normal.                 Left Ear: External ear normal.                Eyes: . Pupils are equal, round, and reactive to light. Conjunctivae and EOM are normal               Nose: without d/c or deformity               Neck: Neck supple. Gross normal ROM               Cardiovascular: Normal rate and regular rhythm.                 Pulmonary/Chest: Effort normal and breath sounds without rales or wheezing.                Abd:  Soft, NT, ND, + BS, no organomegaly               Neurological: Pt is alert. At baseline orientation, motor grossly intact               Skin: Skin is warm. No rashes, no other new lesions, LE edema - none               Psychiatric: Pt behavior is normal without agitation   Micro: none  Cardiac tracings I have personally interpreted today:  none  Pertinent Radiological findings (summarize): none   Lab Results  Component Value Date   WBC 8.4 05/28/2022   HGB 13.3 05/28/2022   HCT 41.7 05/28/2022   PLT 280 05/28/2022   GLUCOSE 138 (H) 07/06/2022   CHOL 172 07/06/2022   TRIG 117.0 07/06/2022   HDL 43.00 07/06/2022   LDLDIRECT 106.0 05/18/2021   LDLCALC 105 (H) 07/06/2022   ALT 11 07/06/2022   AST 15 07/06/2022   NA 142 07/06/2022   K 3.7 07/06/2022   CL 105 07/06/2022   CREATININE 1.30 07/06/2022   BUN 19 07/06/2022   CO2 28 07/06/2022   TSH 0.76 01/03/2022   PSA 0.24  01/03/2022   INR 1.25 05/23/2015   HGBA1C 6.6 (H) 07/06/2022   MICROALBUR 78.7 (H) 01/03/2022   Assessment/Plan:  Neil [name] is a 81 y.o. White or Caucasian [1] male with  has a past medical history of ALLERGIC RHINITIS (12/04/2007), Anxiety, BENIGN PROSTATIC HYPERTROPHY (06/05/2007), BUNDLE BRANCH BLOCK, RIGHT (07/21/2009), BURSITIS, RIGHT SHOULDER (11/06/2010), Choledocholithiasis, CKD (chronic kidney disease) stage 3, GFR 30-59 ml/min (HCC) (08/02/2017), COLONIC POLYPS, HX OF (06/05/2007), Coronary artery calcification seen on CT scan (03/07/2016), Depression, DIABETES MELLITUS, TYPE [name] (12/04/2007), DIVERTICULOSIS, COLON (07/21/2009), ERECTILE DYSFUNCTION (06/05/2007), Gallstones, HYPERLIPIDEMIA (06/05/2007), HYPERTENSION (06/05/2007), Hypotension, INGUINAL HERNIA, RIGHT, SMALL (11/06/2010), Liver abscess (05/23/2015), and SKIN LESION (06/03/2008).  Vitamin D deficiency Last vitamin D Lab Results  Component Value Date   VD25OH 51.68 07/06/2022   Stable, cont oral replacement   Hyperlipidemia Lab Results  Component Value Date   LDLCALC 105 (H) 07/06/2022   Uncontrolled, goal ldl < 70, has been statin intolerant,  ok to add zetia 10 mg qd, lower chol diet   Essential hypertension, benign BP Readings from Last 3 Encounters:  07/13/22 (!) 140/68  05/29/22 (!) 185/79  05/28/22 (!) 157/76   Pt states controlled at home though uncontrolled here, continue lotensin 40 mg qd, and toprol xl 50 qd as declines change   Diabetes ([place]) Lab Results  Component Value Date   HGBA1C 6.6 (H) 07/06/2022   Stable, pt to continue current medical treatment metformin ER 500 - 1 qd    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  [name], MD 07/14/2022 12:34 PM [facility]

## 2022-07-13 NOTE — Patient Instructions (Addendum)
Please take all new medication as prescribed - the zetia 10 mg per day for cholesterol  Please continue all other medications as before, and refills have been done if requested.  Please have the pharmacy call with any other refills you may need.  Please continue your efforts at being more active, low cholesterol diet, and weight control.  You are otherwise up to date with prevention measures today.  Please keep your appointments with your specialists as you may have planned  Please make an Appointment to return in 6 months, or sooner if needed, also with Lab Appointment for testing done 3-5 days before at the [facility] (so this is for TWO appointments - please see the scheduling desk as you leave)

## 2022-07-14 ENCOUNTER — Encounter: Payer: Self-pay | Admitting: Internal Medicine

## 2022-07-14 DIAGNOSIS — G8929 Other chronic pain: Secondary | ICD-10-CM | POA: Insufficient documentation

## 2022-07-14 NOTE — Assessment & Plan Note (Signed)
Chronic mild pain with reduced ROM to 100 degress abduction only, pt states not bad enough to look into now, and to consider sport medicine for worsening

## 2022-07-14 NOTE — Assessment & Plan Note (Signed)
Lab Results  Component Value Date   LDLCALC 105 (H) 07/06/2022   Uncontrolled, goal ldl < 70, has been statin intolerant, ok to add zetia 10 mg qd, lower chol diet

## 2022-07-14 NOTE — Assessment & Plan Note (Signed)
Lab Results  Component Value Date   HGBA1C 6.6 (H) 07/06/2022   Stable, pt to continue current medical treatment metformin ER 500 - 1 qd

## 2022-07-14 NOTE — Assessment & Plan Note (Signed)
BP Readings from Last 3 Encounters:  07/13/22 (!) 140/68  05/29/22 (!) 185/79  05/28/22 (!) 157/76   Pt states controlled at home though uncontrolled here, continue lotensin 40 mg qd, and toprol xl 50 qd as declines change

## 2022-07-14 NOTE — Assessment & Plan Note (Signed)
Last vitamin D Lab Results  Component Value Date   VD25OH 51.68 07/06/2022   Stable, cont oral replacement

## 2022-08-09 DIAGNOSIS — R338 Other retention of urine: Secondary | ICD-10-CM | POA: Diagnosis not present

## 2022-08-30 ENCOUNTER — Other Ambulatory Visit: Payer: Self-pay | Admitting: Internal Medicine

## 2022-08-30 NOTE — Telephone Encounter (Signed)
Please refill as per office routine med refill policy (all routine meds to be refilled for 3 mo or monthly (per pt preference) up to one year from last visit, then month to month grace period for 3 mo, then further med refills will have to be denied) ? ?

## 2022-09-05 DIAGNOSIS — R338 Other retention of urine: Secondary | ICD-10-CM | POA: Diagnosis not present

## 2022-09-12 ENCOUNTER — Other Ambulatory Visit: Payer: Self-pay | Admitting: Internal Medicine

## 2022-09-12 NOTE — Telephone Encounter (Signed)
Please refill as per office routine med refill policy (all routine meds to be refilled for 3 mo or monthly (per pt preference) up to one year from last visit, then month to month grace period for 3 mo, then further med refills will have to be denied) ? ?

## 2022-09-14 ENCOUNTER — Other Ambulatory Visit: Payer: Self-pay | Admitting: Internal Medicine

## 2022-09-15 NOTE — Telephone Encounter (Signed)
Please refill as per office routine med refill policy (all routine meds to be refilled for 3 mo or monthly (per pt preference) up to one year from last visit, then month to month grace period for 3 mo, then further med refills will have to be denied) ? ?

## 2022-10-03 DIAGNOSIS — R338 Other retention of urine: Secondary | ICD-10-CM | POA: Diagnosis not present

## 2022-10-31 DIAGNOSIS — R338 Other retention of urine: Secondary | ICD-10-CM | POA: Diagnosis not present

## 2022-11-07 ENCOUNTER — Other Ambulatory Visit: Payer: Self-pay | Admitting: Internal Medicine

## 2022-11-07 NOTE — Telephone Encounter (Signed)
Please refill as per office routine med refill policy (all routine meds to be refilled for 3 mo or monthly (per pt preference) up to one year from last visit, then month to month grace period for 3 mo, then further med refills will have to be denied)

## 2022-11-29 ENCOUNTER — Encounter: Payer: Self-pay | Admitting: Internal Medicine

## 2022-12-02 ENCOUNTER — Other Ambulatory Visit: Payer: Self-pay | Admitting: Internal Medicine

## 2022-12-03 NOTE — Telephone Encounter (Signed)
Please refill as per office routine med refill policy (all routine meds to be refilled for 3 mo or monthly (per pt preference) up to one year from last visit, then month to month grace period for 3 mo, then further med refills will have to be denied) ? ?

## 2022-12-04 ENCOUNTER — Other Ambulatory Visit: Payer: Self-pay | Admitting: Internal Medicine

## 2022-12-05 DIAGNOSIS — R338 Other retention of urine: Secondary | ICD-10-CM | POA: Diagnosis not present

## 2022-12-14 ENCOUNTER — Other Ambulatory Visit: Payer: Self-pay | Admitting: Internal Medicine

## 2022-12-14 NOTE — Telephone Encounter (Signed)
Please refill as per office routine med refill policy (all routine meds to be refilled for 3 mo or monthly (per pt preference) up to one year from last visit, then month to month grace period for 3 mo, then further med refills will have to be denied) ? ?

## 2022-12-19 ENCOUNTER — Other Ambulatory Visit: Payer: Self-pay | Admitting: Internal Medicine

## 2022-12-20 ENCOUNTER — Other Ambulatory Visit: Payer: Self-pay | Admitting: Internal Medicine

## 2022-12-20 NOTE — Telephone Encounter (Signed)
Please refill as per office routine med refill policy (all routine meds to be refilled for 3 mo or monthly (per pt preference) up to one year from last visit, then month to month grace period for 3 mo, then further med refills will have to be denied) ? ?

## 2023-01-03 DIAGNOSIS — R338 Other retention of urine: Secondary | ICD-10-CM | POA: Diagnosis not present

## 2023-01-08 ENCOUNTER — Other Ambulatory Visit (INDEPENDENT_AMBULATORY_CARE_PROVIDER_SITE_OTHER): Payer: 59

## 2023-01-08 DIAGNOSIS — Z0000 Encounter for general adult medical examination without abnormal findings: Secondary | ICD-10-CM | POA: Diagnosis not present

## 2023-01-08 DIAGNOSIS — E1165 Type 2 diabetes mellitus with hyperglycemia: Secondary | ICD-10-CM | POA: Diagnosis not present

## 2023-01-08 DIAGNOSIS — E538 Deficiency of other specified B group vitamins: Secondary | ICD-10-CM | POA: Diagnosis not present

## 2023-01-08 DIAGNOSIS — E559 Vitamin D deficiency, unspecified: Secondary | ICD-10-CM

## 2023-01-08 LAB — URINALYSIS, ROUTINE W REFLEX MICROSCOPIC
Bilirubin Urine: NEGATIVE
Ketones, ur: NEGATIVE
Nitrite: POSITIVE — AB
Specific Gravity, Urine: >=1.03 — AB (ref 1.000–1.030)
Total Protein, Urine: 100 — AB
Urine Glucose: NEGATIVE
Urobilinogen, UA: 0.2 (ref 0.0–1.0)
pH: 6 (ref 5.0–8.0)

## 2023-01-08 LAB — MICROALBUMIN / CREATININE URINE RATIO
Creatinine,U: 96.7 mg/dL
Microalb Creat Ratio: 92.3 mg/g — ABNORMAL HIGH (ref 0.0–30.0)
Microalb, Ur: 89.2 mg/dL — ABNORMAL HIGH (ref 0.0–1.9)

## 2023-01-08 LAB — HEPATIC FUNCTION PANEL
ALT: 10 U/L (ref 0–53)
AST: 14 U/L (ref 0–37)
Albumin: 4.2 g/dL (ref 3.5–5.2)
Alkaline Phosphatase: 28 U/L — ABNORMAL LOW (ref 39–117)
Bilirubin, Direct: 0.1 mg/dL (ref 0.0–0.3)
Total Bilirubin: 0.5 mg/dL (ref 0.2–1.2)
Total Protein: 7.3 g/dL (ref 6.0–8.3)

## 2023-01-08 LAB — BASIC METABOLIC PANEL
BUN: 20 mg/dL (ref 6–23)
CO2: 27 mEq/L (ref 19–32)
Calcium: 9.8 mg/dL (ref 8.4–10.5)
Chloride: 106 mEq/L (ref 96–112)
Creatinine, Ser: 1.29 mg/dL (ref 0.40–1.50)
GFR: 52.3 mL/min — ABNORMAL LOW (ref >60.00)
Glucose, Bld: 130 mg/dL — ABNORMAL HIGH (ref 70–99)
Potassium: 4.8 mEq/L (ref 3.5–5.1)
Sodium: 142 mEq/L (ref 135–145)

## 2023-01-08 LAB — LIPID PANEL
Cholesterol: 142 mg/dL (ref 0–200)
HDL: 46.5 mg/dL (ref >39.00)
LDL Cholesterol: 78 mg/dL (ref 0–99)
NonHDL: 95.65
Total CHOL/HDL Ratio: 3
Triglycerides: 87 mg/dL (ref 0.0–149.0)
VLDL: 17.4 mg/dL (ref 0.0–40.0)

## 2023-01-08 LAB — CBC WITH DIFFERENTIAL/PLATELET
Basophils Absolute: 0.1 10*3/uL (ref 0.0–0.1)
Basophils Relative: 1.5 % (ref 0.0–3.0)
Eosinophils Absolute: 0.5 10*3/uL (ref 0.0–0.7)
Eosinophils Relative: 7.8 % — ABNORMAL HIGH (ref 0.0–5.0)
HCT: 41 % (ref 39.0–52.0)
Hemoglobin: 13.6 g/dL (ref 13.0–17.0)
Lymphocytes Relative: 26.4 % (ref 12.0–46.0)
Lymphs Abs: 1.6 10*3/uL (ref 0.7–4.0)
MCHC: 33.2 g/dL (ref 30.0–36.0)
MCV: 85.2 fl (ref 78.0–100.0)
Monocytes Absolute: 0.6 10*3/uL (ref 0.1–1.0)
Monocytes Relative: 9.5 % (ref 3.0–12.0)
Neutro Abs: 3.3 10*3/uL (ref 1.4–7.7)
Neutrophils Relative %: 54.8 % (ref 43.0–77.0)
Platelets: 258 10*3/uL (ref 150.0–400.0)
RBC: 4.82 Mil/uL (ref 4.22–5.81)
RDW: 15.3 % (ref 11.5–15.5)
WBC: 6 10*3/uL (ref 4.0–10.5)

## 2023-01-08 LAB — TSH: TSH: 1.01 u[IU]/mL (ref 0.35–5.50)

## 2023-01-08 LAB — VITAMIN D 25 HYDROXY (VIT D DEFICIENCY, FRACTURES): VITD: 53.11 ng/mL (ref 30.00–100.00)

## 2023-01-08 LAB — VITAMIN B12: Vitamin B-12: 565 pg/mL (ref 211–911)

## 2023-01-08 LAB — PSA: PSA: 0.57 ng/mL (ref 0.10–4.00)

## 2023-01-08 LAB — HEMOGLOBIN A1C: Hgb A1c MFr Bld: 6.5 % (ref 4.6–6.5)

## 2023-01-11 ENCOUNTER — Ambulatory Visit: Payer: Medicare Other | Admitting: Internal Medicine

## 2023-01-14 ENCOUNTER — Encounter: Payer: Self-pay | Admitting: Internal Medicine

## 2023-01-14 ENCOUNTER — Ambulatory Visit (INDEPENDENT_AMBULATORY_CARE_PROVIDER_SITE_OTHER): Payer: 59 | Admitting: Internal Medicine

## 2023-01-14 VITALS — BP 132/78 | HR 50 | Temp 98.4°F | Ht 68.0 in | Wt 152.0 lb

## 2023-01-14 DIAGNOSIS — I10 Essential (primary) hypertension: Secondary | ICD-10-CM

## 2023-01-14 DIAGNOSIS — E538 Deficiency of other specified B group vitamins: Secondary | ICD-10-CM | POA: Diagnosis not present

## 2023-01-14 DIAGNOSIS — Z0001 Encounter for general adult medical examination with abnormal findings: Secondary | ICD-10-CM | POA: Diagnosis not present

## 2023-01-14 DIAGNOSIS — E782 Mixed hyperlipidemia: Secondary | ICD-10-CM

## 2023-01-14 DIAGNOSIS — E559 Vitamin D deficiency, unspecified: Secondary | ICD-10-CM

## 2023-01-14 DIAGNOSIS — N1831 Chronic kidney disease, stage 3a: Secondary | ICD-10-CM | POA: Diagnosis not present

## 2023-01-14 DIAGNOSIS — E1165 Type 2 diabetes mellitus with hyperglycemia: Secondary | ICD-10-CM | POA: Diagnosis not present

## 2023-01-14 MED ORDER — TRAZODONE HCL 50 MG PO TABS: 25.0000 mg | ORAL_TABLET | Freq: Every evening | ORAL | 1 refills | Status: DC | PRN

## 2023-01-14 NOTE — Assessment & Plan Note (Signed)
Lab Results  Component Value Date   CREATININE 1.29 01/08/2023   Stable overall, cont to avoid nephrotoxins

## 2023-01-14 NOTE — Assessment & Plan Note (Signed)
BP Readings from Last 3 Encounters:  01/14/23 132/78  07/13/22 (!) 140/68  05/29/22 (!) 185/79   Stable, pt to continue medical treatment toprll xl 50 mg qd, lotensin 40 qd

## 2023-01-14 NOTE — Assessment & Plan Note (Signed)
Lab Results  Component Value Date   [id number] 01/08/2023   Stable, cont oral replacement - b12 1000 mcg qd

## 2023-01-14 NOTE — Progress Notes (Signed)
Patient ID: Neil [name], male   DOB: Dec 29, 1940, 82 y.o.   MRN: [id number]         Chief Complaint:: wellness exam and low b12, ckd, dm, htn, hld       HPI:  Neil [name] is a 82 y.o. male here for wellness exam; up to date                        Also plans to see sports medicine soon for left shoulder pain ? Rot cuff injury after fall last year.  Pt denies chest pain, increased sob or doe, wheezing, orthopnea, PND, increased LE swelling, palpitations, dizziness or syncope.   Pt denies polydipsia, polyuria, or new focal neuro s/s.    Pt denies fever, wt loss, night sweats, loss of appetite, or other constitutional symptoms     Wt Readings from Last 3 Encounters:  01/14/23 152 lb (68.9 kg)  07/13/22 156 lb (70.8 kg)  06/29/22 153 lb (69.4 kg)   BP Readings from Last 3 Encounters:  01/14/23 132/78  07/13/22 (!) 140/68  05/29/22 (!) 185/79   Immunization History  Administered Date(s) Administered   Fluad Quad(high Dose 65+) 06/05/2019, 07/07/2020, 06/25/2022   Influenza Split 07/04/2012   Influenza Whole 07/21/2009, 11/06/2010   Influenza, High Dose Seasonal PF 07/03/2016, 08/02/2017, 07/10/2018   Influenza,inj,Quad PF,6+ Mos 06/24/2013, 06/25/2014, 07/12/2015   Influenza-Unspecified 07/11/2021   PFIZER(Purple Top)SARS-COV-2 Vaccination 10/27/2019, 11/16/2019, 07/10/2020, 01/19/2021, 01/01/2023   Pfizer Covid-19 Vaccine Bivalent Booster 6yrs & up 07/11/2021, 02/14/2022   Pneumococcal Conjugate-13 03/17/2014   Pneumococcal Polysaccharide-23 11/14/2011   Respiratory Syncytial Virus Vaccine,Recomb Aduvanted(Arexvy) 06/25/2022   Td 07/21/2009   Tdap 01/16/2022   Zoster Recombinat (Shingrix) 01/16/2022, 01/08/2023   There are no preventive care reminders to display for this patient.     Past Medical History:  Diagnosis Date   ALLERGIC RHINITIS 12/04/2007   Anxiety    BENIGN PROSTATIC HYPERTROPHY 06/05/2007   BUNDLE BRANCH BLOCK, RIGHT 07/21/2009   BURSITIS, RIGHT  SHOULDER 11/06/2010   Choledocholithiasis    CKD (chronic kidney disease) stage 3, GFR 30-59 ml/min (HCC) 08/02/2017   COLONIC POLYPS, HX OF 06/05/2007   tubular adenoma also 02/19/2013   Coronary artery calcification seen on CT scan 03/07/2016   Depression    DIABETES MELLITUS, TYPE [name] 12/04/2007   DIVERTICULOSIS, COLON 07/21/2009   ERECTILE DYSFUNCTION 06/05/2007   Gallstones    HYPERLIPIDEMIA 06/05/2007   on meds   HYPERTENSION 06/05/2007   on meds   Hypotension    INGUINAL HERNIA, RIGHT, SMALL 11/06/2010   Liver abscess 05/23/2015   SKIN LESION 06/03/2008   Past Surgical History:  Procedure Laterality Date   BIOPSY  09/08/2021   Procedure: BIOPSY;  Surgeon: Thornton Park, MD;  Location: [facility];  Service: Endoscopy;;   CHOLECYSTECTOMY N/A 03/02/2015   Procedure: LAPAROSCOPIC CHOLECYSTECTOMY;  Surgeon: Fanny Skates, MD;  Location: [facility];  Service: General;  Laterality: N/A;   COLONOSCOPY  2017   JP-MAC-suprep (good)-TA   CYSTOSCOPY N/A 05/29/2022   Procedure: Consuela Mimes;  Surgeon: Remi Haggard, MD;  Location: [facility];  Service: Urology;  Laterality: N/A;   ERCP N/A 03/01/2015   Procedure: ENDOSCOPIC RETROGRADE CHOLANGIOPANCREATOGRAPHY (ERCP);  Surgeon: Irene Shipper, MD;  Location: [facility];  Service: Endoscopy;  Laterality: N/A;  Dr Dalbert Batman wants patient to stay overnight after ERCP   ERCP N/A 05/03/2015   Procedure: ENDOSCOPIC RETROGRADE CHOLANGIOPANCREATOGRAPHY (ERCP);  Surgeon: Irene Shipper, MD;  Location: [facility];  Service: Endoscopy;  Laterality: N/A;   FINGER SURGERY Left    "little finger"   FLEXIBLE SIGMOIDOSCOPY N/A 09/08/2021   Procedure: FLEXIBLE SIGMOIDOSCOPY;  Surgeon: Thornton Park, MD;  [facility];  Service: Endoscopy;  Laterality: N/A;   [place]   INGUINAL HERNIA REPAIR Right 2014   8'14 repair   INSERTION OF SUPRAPUBIC CATHETER N/A 05/29/2022   Procedure: INSERTION OF SUPRAPUBIC CATHETER;  Surgeon:  Remi Haggard, MD;  Location: [facility];  Service: Urology;  Laterality: N/A;  [place]    reports that he quit smoking about 16 years ago. His smoking use included cigarettes. He has never used smokeless tobacco. He reports that he does not currently use alcohol. He reports that he does not use drugs. family history includes Brain cancer (age of onset: 69) in his mother; Colon cancer in his paternal uncle; Colon cancer (age of onset: 9) in his mother; Colon polyps in his paternal uncle; Colon polyps (age of onset: 79) in his mother. Allergies  Allergen Reactions   Statins Other (See Comments)    Joint pain   Zanaflex [Tizanidine] Other (See Comments)    Dizzy, sleepy   Zetia [Ezetimibe] Other (See Comments)    Pt unsure of allergy    Current Outpatient Medications on File Prior to Visit  Medication Sig Dispense Refill   acetaminophen (TYLENOL) 500 MG tablet Take 1,500 mg by mouth daily as needed for mild pain.     Alpha-D-Galactosidase (BEANO PO) Take 1 tablet by mouth at bedtime.     aspirin 81 MG EC tablet Take 81 mg by mouth daily.     benazepril (LOTENSIN) 40 MG tablet TAKE 1 TABLET BY MOUTH DAILY 100 tablet 2   Cholecalciferol (VITAMIN D) 50 MCG (2000 UT) CAPS Take 2,000 Units by mouth at bedtime.     citalopram (CELEXA) 10 MG tablet TAKE 1 TABLET BY MOUTH  DAILY (Patient taking differently: Take 10 mg by mouth every evening.) 90 tablet 2   cyclobenzaprine (FLEXERIL) 5 MG tablet TAKE 1 TABLET BY MOUTH 3  TIMES DAILY AS NEEDED FOR  MUSCLE SPASM(S) 270 tablet 1   docusate sodium (COLACE) 100 MG capsule Take 100 mg by mouth daily.     ezetimibe (ZETIA) 10 MG tablet Take 1 tablet (10 mg total) by mouth daily. 90 tablet 3   fenofibrate 160 MG tablet TAKE 1 TABLET BY MOUTH AT  BEDTIME 100 tablet 2   finasteride (PROSCAR) 5 MG tablet TAKE 1 TABLET BY MOUTH DAILY 100 tablet 1   ibuprofen (ADVIL) 200 MG tablet Take 600 mg by  mouth 2 (two) times daily as needed for moderate pain.     metFORMIN (GLUCOPHAGE-XR) 500 MG 24 hr tablet TAKE 1 TABLET BY MOUTH DAILY  WITH BREAKFAST 100 tablet 2   methenamine (HIPREX) 1 g tablet Take 1 g by mouth 2 (two) times daily.     metoprolol succinate (TOPROL-XL) 50 MG 24 hr tablet Take 1 tablet (50 mg total) by mouth daily. Take with or immediately following a meal. 90 tablet 3   Multiple Vitamin (MULTIVITAMIN WITH MINERALS) TABS tablet Take 1 tablet by mouth daily. 30 tablet 0   silodosin (RAPAFLO) 4 MG CAPS capsule Take 1 capsule (4 mg total) by mouth daily with breakfast. 90 capsule 3   sodium chloride (OCEAN) 0.65 % nasal spray Place 1  spray into the nose daily as needed for congestion.      terazosin (HYTRIN) 2 MG capsule Take 1 capsule (2 mg total) by mouth at bedtime. 90 capsule 3   No current facility-administered medications on file prior to visit.        ROS:  All others reviewed and negative.  Objective        PE:  BP 132/78 (BP Location: Left Arm, Patient Position: Sitting, Cuff Size: Normal)   Pulse (!) 50   Temp 98.4 F (36.9 C) (Oral)   Ht 5\' 8"  (1.727 m)   Wt 152 lb (68.9 kg)   SpO2 98%   BMI 23.11 kg/m                 Constitutional: Pt appears in NAD               HENT: Head: NCAT.                Right Ear: External ear normal.                 Left Ear: External ear normal.                Eyes: . Pupils are equal, round, and reactive to light. Conjunctivae and EOM are normal               Nose: without d/c or deformity               Neck: Neck supple. Gross normal ROM               Cardiovascular: Normal rate and regular rhythm.                 Pulmonary/Chest: Effort normal and breath sounds without rales or wheezing.                Abd:  Soft, NT, ND, + BS, no organomegaly               Neurological: Pt is alert. At baseline orientation, motor grossly intact               Skin: Skin is warm. No rashes, no other new lesions, LE edema - none                Psychiatric: Pt behavior is normal without agitation   Micro: none  Cardiac tracings I have personally interpreted today:  none  Pertinent Radiological findings (summarize): none   Lab Results  Component Value Date   WBC 6.0 01/08/2023   HGB 13.6 01/08/2023   HCT 41.0 01/08/2023   PLT 258.0 01/08/2023   GLUCOSE 130 (H) 01/08/2023   CHOL 142 01/08/2023   TRIG 87.0 01/08/2023   HDL 46.50 01/08/2023   LDLDIRECT 106.0 05/18/2021   LDLCALC 78 01/08/2023   ALT 10 01/08/2023   AST 14 01/08/2023   NA 142 01/08/2023   K 4.8 01/08/2023   CL 106 01/08/2023   CREATININE 1.29 01/08/2023   BUN 20 01/08/2023   CO2 27 01/08/2023   TSH 1.01 01/08/2023   PSA 0.57 01/08/2023   INR 1.25 05/23/2015   HGBA1C 6.5 01/08/2023   MICROALBUR 89.2 (H) 01/08/2023   Assessment/Plan:  Neil [name] is a 82 y.o. White or Caucasian [1] male with  has a past medical history of ALLERGIC RHINITIS (12/04/2007), Anxiety, BENIGN PROSTATIC HYPERTROPHY (06/05/2007), BUNDLE BRANCH BLOCK, RIGHT (07/21/2009), BURSITIS, RIGHT SHOULDER (11/06/2010), Choledocholithiasis, CKD (chronic kidney disease)  stage 3, GFR 30-59 ml/min (HCC) (08/02/2017), COLONIC POLYPS, HX OF (06/05/2007), Coronary artery calcification seen on CT scan (03/07/2016), Depression, DIABETES MELLITUS, TYPE [name] (12/04/2007), DIVERTICULOSIS, COLON (07/21/2009), ERECTILE DYSFUNCTION (06/05/2007), Gallstones, HYPERLIPIDEMIA (06/05/2007), HYPERTENSION (06/05/2007), Hypotension, INGUINAL HERNIA, RIGHT, SMALL (11/06/2010), Liver abscess (05/23/2015), and SKIN LESION (06/03/2008).  Encounter for well adult exam with abnormal findings Age and sex appropriate education and counseling updated with regular exercise and diet Referrals for preventative services - none needed Immunizations addressed - none needed Smoking counseling  - none needed Evidence for depression or other mood disorder - none significant Most recent labs reviewed. I have personally  reviewed and have noted: 1) the patient's medical and social history 2) The patient's current medications and supplements 3) The patient's height, weight, and BMI have been recorded in the chart   B12 deficiency Lab Results  Component Value Date   [id number] 01/08/2023   Stable, cont oral replacement - b12 1000 mcg qd   CKD (chronic kidney disease) stage 3, GFR 30-59 ml/min (HCC) Lab Results  Component Value Date   CREATININE 1.29 01/08/2023   Stable overall, cont to avoid nephrotoxins   Diabetes ([place]) Lab Results  Component Value Date   HGBA1C 6.5 01/08/2023   Stable, pt to continue current medical treatment metfomrin ER 500 mg - 1 qd   Essential hypertension, benign BP Readings from Last 3 Encounters:  01/14/23 132/78  07/13/22 (!) 140/68  05/29/22 (!) 185/79   Stable, pt to continue medical treatment toprll xl 50 mg qd, lotensin 40 qd   Hyperlipidemia Lab Results  Component Value Date   LDLCALC 78 01/08/2023   Uncontrolled, goal ldl < 70, pt to continue current zetia 10 mg qd, fenoffibrate 160 qd, declines statin   Vitamin D deficiency Last vitamin D Lab Results  Component Value Date   VD25OH 53.11 01/08/2023   Stable, cont oral replacement  Followup: Return in about 6 months (around 07/16/2023).  [name], MD 01/14/2023 8:20 PM [facility]

## 2023-01-14 NOTE — Assessment & Plan Note (Addendum)
Lab Results  Component Value Date   LDLCALC 78 01/08/2023   Uncontrolled, goal ldl < 70, pt to continue current zetia 10 mg qd, fenoffibrate 160 qd, declines statin

## 2023-01-14 NOTE — Assessment & Plan Note (Signed)

## 2023-01-14 NOTE — Assessment & Plan Note (Signed)
Lab Results  Component Value Date   HGBA1C 6.5 01/08/2023   Stable, pt to continue current medical treatment metfomrin ER 500 mg - 1 qd

## 2023-01-14 NOTE — Patient Instructions (Signed)
Please continue all other medications as before, and refills have been done if requested. ° °Please have the pharmacy call with any other refills you may need. ° °Please continue your efforts at being more active, low cholesterol diet, and weight control. ° °You are otherwise up to date with prevention measures today. ° °Please keep your appointments with your specialists as you may have planned ° °Please make an Appointment to return in 6 months, or sooner if needed °

## 2023-01-14 NOTE — Assessment & Plan Note (Signed)
Last vitamin D Lab Results  Component Value Date   VD25OH 53.11 01/08/2023   Stable, cont oral replacement

## 2023-01-16 NOTE — Progress Notes (Signed)
Rubin Payor, PhD, LAT, ATC acting as a scribe for Clementeen Graham, MD.  Subjective:    CC: L shoulder pain  HPI: Pt is an 82 y/o male c/o L shoulder pain ongoing since Oct after suffering a fall. Pt locates pain to anterior aspect of the shoulder. Sx radiate into the lateral aspect of the upper arm. ROM has been well maintained overall but is painful. Not causing night disturbance.   Radiates: into deltoid UE Numbness/tingling: no UE Weakness: no Aggravates: pain with ROM Treatments tried: BioFreeze with some relief  Pertinent review of Systems: No fevers or chills  Relevant historical information: Peripheral arterial disease   Objective:    Vitals:   01/17/23 1426  BP: (!) 160/82  Pulse: (!) 58  SpO2: 96%   General: Well Developed, well nourished, and in no acute distress.   MSK: Left shoulder normal-appearing normal motion.  Abduction. Strength is intact. Positive Hawkins and Neer's test. Negative Neer's and speeds test.  Lab and Radiology Results  Procedure: Real-time Ultrasound Guided Injection of left shoulder glenohumeral joint Device: Philips Affiniti 50G Images permanently stored and available for review in PACS Ultrasound evaluation prior to injection significant for small hypoechoic change distal at the distal portion of supraspinatus tendon.  This could possibly represent a small nonretracted rotator cuff tear but otherwise the rotator cuff tendons look to be intact. Verbal informed consent obtained.  Discussed risks and benefits of procedure. Warned about infection, bleeding, hyperglycemia damage to structures among others. Patient expresses understanding and agreement Time-out conducted.   Noted no overlying erythema, induration, or other signs of local infection.   Skin prepped in a sterile fashion.   Local anesthesia: Topical Ethyl chloride.   With sterile technique and under real time ultrasound guidance: 40 mg of Kenalog and 2 mL Marcaine  injected into the glenohumeral joint. Fluid seen entering the joint capsule.   Completed without difficulty   Pain immediately resolved suggesting accurate placement of the medication.   Advised to call if fevers/chills, erythema, induration, drainage, or persistent bleeding.   Images permanently stored and available for review in the ultrasound unit.  Impression: Technically successful ultrasound guided injection.    X-ray images left shoulder obtained today personally and independently interpreted. Mild AC DJD.  Minimal glenohumeral DJD.  No acute fractures are visible.    Impression and Recommendations:    Assessment and Plan: 82 y.o. male with chronic left shoulder pain occurring after a fall.  May be some concern for chronic small nonretracted rotator cuff tear.  May be exacerbation of mild underlying DJD.  Plan for trial glenohumeral injection and physical therapy.  Recheck in 6 weeks.  PDMP not reviewed this encounter. Orders Placed This Encounter  Procedures   [place] LIMITED JOINT SPACE STRUCTURES UP LEFT(NO LINKED CHARGES)    Order Specific Question:   Reason for Exam (SYMPTOM  OR DIAGNOSIS REQUIRED)    Answer:   left shoulder pain    Order Specific Question:   Preferred imaging location?    Answer:   [facility] Shoulder Left    Standing Status:   Future    Number of Occurrences:   1    Standing Expiration Date:   02/16/2023    Order Specific Question:   Reason for Exam (SYMPTOM  OR DIAGNOSIS REQUIRED)    Answer:   left shoulder pain    Order Specific Question:   Preferred imaging location?    Answer:   Adult nurse  [facility]   Ambulatory referral to Physical Therapy    Referral Priority:   Routine    Referral Type:   Physical Medicine    Referral Reason:   Specialty Services Required    Requested Specialty:   Physical Therapy    Number of Visits Requested:   1   No orders of the defined types were placed in this encounter.   Discussed  warning signs or symptoms. Please see discharge instructions. Patient expresses understanding.   The above documentation has been reviewed and is accurate and complete Clementeen Graham, M.D.

## 2023-01-17 ENCOUNTER — Ambulatory Visit (INDEPENDENT_AMBULATORY_CARE_PROVIDER_SITE_OTHER): Payer: 59 | Admitting: Family Medicine

## 2023-01-17 ENCOUNTER — Ambulatory Visit (INDEPENDENT_AMBULATORY_CARE_PROVIDER_SITE_OTHER): Payer: 59

## 2023-01-17 ENCOUNTER — Encounter: Payer: Self-pay | Admitting: Internal Medicine

## 2023-01-17 ENCOUNTER — Other Ambulatory Visit: Payer: Self-pay

## 2023-01-17 ENCOUNTER — Encounter: Payer: Self-pay | Admitting: Family Medicine

## 2023-01-17 VITALS — BP 160/82 | HR 58 | Ht 68.0 in | Wt 154.0 lb

## 2023-01-17 DIAGNOSIS — M25512 Pain in left shoulder: Secondary | ICD-10-CM

## 2023-01-17 NOTE — Patient Instructions (Addendum)
Thank you for coming in today.   Please get an Xray today before you leave   I've referred you to Physical Therapy.  Let us know if you don't hear from them in one week.   You received an injection today. Seek immediate medical attention if the joint becomes red, extremely painful, or is oozing fluid.   Check back in 6 weeks 

## 2023-01-18 ENCOUNTER — Other Ambulatory Visit: Payer: Self-pay

## 2023-01-18 MED ORDER — SILODOSIN 4 MG PO CAPS: 4.0000 mg | ORAL_CAPSULE | Freq: Every day | ORAL | 3 refills | Status: DC

## 2023-01-19 ENCOUNTER — Other Ambulatory Visit: Payer: Self-pay | Admitting: Internal Medicine

## 2023-01-21 NOTE — Progress Notes (Signed)
Left shoulder x-ray looks normal to radiology

## 2023-01-23 ENCOUNTER — Encounter: Payer: Self-pay | Admitting: Internal Medicine

## 2023-02-01 ENCOUNTER — Other Ambulatory Visit: Payer: Self-pay

## 2023-02-01 MED ORDER — TERAZOSIN HCL 2 MG PO CAPS: 2.0000 mg | ORAL_CAPSULE | Freq: Every day | ORAL | 3 refills | Status: DC

## 2023-02-02 ENCOUNTER — Encounter: Payer: Self-pay | Admitting: Internal Medicine

## 2023-02-06 DIAGNOSIS — R338 Other retention of urine: Secondary | ICD-10-CM | POA: Diagnosis not present

## 2023-02-09 ENCOUNTER — Encounter: Payer: Self-pay | Admitting: Internal Medicine

## 2023-02-11 MED ORDER — METHENAMINE HIPPURATE 1 G PO TABS: 1.0000 g | ORAL_TABLET | Freq: Two times a day (BID) | ORAL | 3 refills | Status: DC

## 2023-02-15 ENCOUNTER — Other Ambulatory Visit: Payer: Self-pay

## 2023-02-15 MED ORDER — EZETIMIBE 10 MG PO TABS: 10.0000 mg | ORAL_TABLET | Freq: Every day | ORAL | 3 refills | Status: DC

## 2023-02-28 ENCOUNTER — Encounter: Payer: Self-pay | Admitting: Family Medicine

## 2023-02-28 ENCOUNTER — Ambulatory Visit (INDEPENDENT_AMBULATORY_CARE_PROVIDER_SITE_OTHER): Payer: 59 | Admitting: Family Medicine

## 2023-02-28 VITALS — BP 140/72 | HR 72 | Ht 68.0 in | Wt 153.6 lb

## 2023-02-28 DIAGNOSIS — M25512 Pain in left shoulder: Secondary | ICD-10-CM

## 2023-02-28 DIAGNOSIS — G8929 Other chronic pain: Secondary | ICD-10-CM

## 2023-02-28 NOTE — Patient Instructions (Signed)
Thank you for coming in today.   We can repeat that shot in early July or later if needed.  Return as needed.

## 2023-02-28 NOTE — Progress Notes (Signed)
   I, Stevenson Clinch, CMA acting as a scribe for Clementeen Graham, MD.  Neil [name] is a 82 y.o. male who presents to [facility] today for 6-wk f/u L shoulder pain. Pt was last seen by Dr. Denyse Amass on 01/17/23 and was given a L GH steroid injection and was referred to PT, but he did not schedule any visits. Today, pt reports significant improvement of shoulder sx s/p injection. Did not attend PT because he didn't feel that he needed it.   Dx imaging: 01/17/23 L shoulder XR  Pertinent review of systems: No fevers or chills  Relevant historical information: Heart disease.   Exam:  BP (!) 140/72   Pulse 72   Ht 5\' 8"  (1.727 m)   Wt 153 lb 9.6 oz (69.7 kg)   SpO2 98%   BMI 23.35 kg/m  General: Well Developed, well nourished, and in no acute distress.   MSK: Left shoulder normal-appearing normal motion    Lab and Radiology Results EXAM: LEFT SHOULDER - 2+ VIEW   COMPARISON:  None Available.   FINDINGS: There is no evidence of fracture or dislocation. There is no evidence of arthropathy or other focal bone abnormality. Soft tissues are unremarkable.   IMPRESSION: Negative.     Electronically Signed   By: Layla Maw M.D.   On: 01/20/2023 15:00 I, Clementeen Graham, personally (independently) visualized and performed the interpretation of the images attached in this note.     Assessment and Plan: 82 y.o. male with left shoulder pain thought to be due to chronic rotator cuff tear significantly improved following glenohumeral injection occurring about 6 weeks ago.  We can repeat this injection every 3 months which would be in early July if needed.  I would recommend physical therapy in the future if cortisone shots do not last or do not work very well.  Check back with me as needed.  Reviewed treatment plan and options going forward.   Total encounter time 20 minutes including face-to-face time with the patient and, reviewing past medical record,  and charting on the date of service.     Discussed warning signs or symptoms. Please see discharge instructions. Patient expresses understanding.   The above documentation has been reviewed and is accurate and complete Clementeen Graham, M.D.

## 2023-03-01 ENCOUNTER — Telehealth: Payer: Self-pay | Admitting: Internal Medicine

## 2023-03-01 ENCOUNTER — Encounter: Payer: Self-pay | Admitting: Internal Medicine

## 2023-03-01 NOTE — Telephone Encounter (Signed)
Form placed on providers desk 

## 2023-03-01 NOTE — Telephone Encounter (Signed)
Patient dropped off document  Accessible collection service application  pw, to be filled out by provider. Patient requested to send it via Fax within 7-days. Document is located in providers tray at front office.Please advise at Mobile [phone] (mobile)   Form is to help patient get assistance managing waste bins from the city. The city of [place] is also requesting Dr. Jonny Ruiz write a statement on letterhead to explain why patient needs assistance.   Please fax both to [facility]: [phone]

## 2023-03-04 NOTE — Telephone Encounter (Signed)
Forms have been faxed 

## 2023-03-08 DIAGNOSIS — N319 Neuromuscular dysfunction of bladder, unspecified: Secondary | ICD-10-CM | POA: Diagnosis not present

## 2023-03-08 DIAGNOSIS — R338 Other retention of urine: Secondary | ICD-10-CM | POA: Diagnosis not present

## 2023-04-08 DIAGNOSIS — N319 Neuromuscular dysfunction of bladder, unspecified: Secondary | ICD-10-CM | POA: Diagnosis not present

## 2023-04-08 DIAGNOSIS — R338 Other retention of urine: Secondary | ICD-10-CM | POA: Diagnosis not present

## 2023-05-07 DIAGNOSIS — N319 Neuromuscular dysfunction of bladder, unspecified: Secondary | ICD-10-CM | POA: Diagnosis not present

## 2023-05-07 DIAGNOSIS — R338 Other retention of urine: Secondary | ICD-10-CM | POA: Diagnosis not present

## 2023-05-29 DIAGNOSIS — H40033 Anatomical narrow angle, bilateral: Secondary | ICD-10-CM | POA: Diagnosis not present

## 2023-05-29 DIAGNOSIS — H1013 Acute atopic conjunctivitis, bilateral: Secondary | ICD-10-CM | POA: Diagnosis not present

## 2023-06-04 ENCOUNTER — Other Ambulatory Visit: Payer: Self-pay

## 2023-06-04 ENCOUNTER — Other Ambulatory Visit: Payer: Self-pay | Admitting: Internal Medicine

## 2023-06-13 DIAGNOSIS — N319 Neuromuscular dysfunction of bladder, unspecified: Secondary | ICD-10-CM | POA: Diagnosis not present

## 2023-07-11 DIAGNOSIS — R338 Other retention of urine: Secondary | ICD-10-CM | POA: Diagnosis not present

## 2023-07-16 ENCOUNTER — Telehealth: Payer: Self-pay

## 2023-07-16 ENCOUNTER — Ambulatory Visit: Payer: 59 | Admitting: Internal Medicine

## 2023-07-16 ENCOUNTER — Telehealth: Payer: Self-pay | Admitting: Internal Medicine

## 2023-07-16 ENCOUNTER — Other Ambulatory Visit (INDEPENDENT_AMBULATORY_CARE_PROVIDER_SITE_OTHER): Payer: 59

## 2023-07-16 ENCOUNTER — Encounter: Payer: Self-pay | Admitting: Internal Medicine

## 2023-07-16 ENCOUNTER — Other Ambulatory Visit: Payer: Self-pay | Admitting: Internal Medicine

## 2023-07-16 VITALS — BP 132/74 | HR 65 | Temp 98.1°F | Ht 68.0 in | Wt 159.0 lb

## 2023-07-16 DIAGNOSIS — I10 Essential (primary) hypertension: Secondary | ICD-10-CM

## 2023-07-16 DIAGNOSIS — E1165 Type 2 diabetes mellitus with hyperglycemia: Secondary | ICD-10-CM | POA: Diagnosis not present

## 2023-07-16 DIAGNOSIS — J309 Allergic rhinitis, unspecified: Secondary | ICD-10-CM

## 2023-07-16 DIAGNOSIS — E119 Type 2 diabetes mellitus without complications: Secondary | ICD-10-CM | POA: Diagnosis not present

## 2023-07-16 DIAGNOSIS — N1831 Chronic kidney disease, stage 3a: Secondary | ICD-10-CM

## 2023-07-16 DIAGNOSIS — E782 Mixed hyperlipidemia: Secondary | ICD-10-CM | POA: Diagnosis not present

## 2023-07-16 DIAGNOSIS — E538 Deficiency of other specified B group vitamins: Secondary | ICD-10-CM | POA: Diagnosis not present

## 2023-07-16 DIAGNOSIS — E559 Vitamin D deficiency, unspecified: Secondary | ICD-10-CM

## 2023-07-16 DIAGNOSIS — Z7984 Long term (current) use of oral hypoglycemic drugs: Secondary | ICD-10-CM

## 2023-07-16 LAB — HEPATIC FUNCTION PANEL
ALT: 11 U/L (ref 0–53)
AST: 13 U/L (ref 0–37)
Albumin: 4.2 g/dL (ref 3.5–5.2)
Alkaline Phosphatase: 23 U/L — ABNORMAL LOW (ref 39–117)
Bilirubin, Direct: 0.1 mg/dL (ref 0.0–0.3)
Total Bilirubin: 0.6 mg/dL (ref 0.2–1.2)
Total Protein: 7.2 g/dL (ref 6.0–8.3)

## 2023-07-16 LAB — HEMOGLOBIN A1C: Hgb A1c MFr Bld: 6.9 % — ABNORMAL HIGH (ref 4.6–6.5)

## 2023-07-16 LAB — LIPID PANEL
Cholesterol: 162 mg/dL (ref 0–200)
HDL: 48 mg/dL (ref >39.00)
LDL Cholesterol: 82 mg/dL (ref 0–99)
NonHDL: 113.88
Total CHOL/HDL Ratio: 3
Triglycerides: 157 mg/dL — ABNORMAL HIGH (ref 0.0–149.0)
VLDL: 31.4 mg/dL (ref 0.0–40.0)

## 2023-07-16 LAB — CBC WITH DIFFERENTIAL/PLATELET
Basophils Absolute: 0.1 10*3/uL (ref 0.0–0.1)
Basophils Relative: 1.1 % (ref 0.0–3.0)
Eosinophils Absolute: 0.3 10*3/uL (ref 0.0–0.7)
Eosinophils Relative: 4.3 % (ref 0.0–5.0)
HCT: 41.8 % (ref 39.0–52.0)
Hemoglobin: 13.8 g/dL (ref 13.0–17.0)
Lymphocytes Relative: 24.4 % (ref 12.0–46.0)
Lymphs Abs: 1.8 10*3/uL (ref 0.7–4.0)
MCHC: 32.9 g/dL (ref 30.0–36.0)
MCV: 86.3 fL (ref 78.0–100.0)
Monocytes Absolute: 0.8 10*3/uL (ref 0.1–1.0)
Monocytes Relative: 10.5 % (ref 3.0–12.0)
Neutro Abs: 4.4 10*3/uL (ref 1.4–7.7)
Neutrophils Relative %: 59.7 % (ref 43.0–77.0)
Platelets: 252 10*3/uL (ref 150.0–400.0)
RBC: 4.85 Mil/uL (ref 4.22–5.81)
RDW: 14.3 % (ref 11.5–15.5)
WBC: 7.4 10*3/uL (ref 4.0–10.5)

## 2023-07-16 LAB — MICROALBUMIN / CREATININE URINE RATIO
Creatinine,U: 107.8 mg/dL
Microalb Creat Ratio: 46.3 mg/g — ABNORMAL HIGH (ref 0.0–30.0)
Microalb, Ur: 49.9 mg/dL — ABNORMAL HIGH (ref 0.0–1.9)

## 2023-07-16 LAB — BASIC METABOLIC PANEL
BUN: 18 mg/dL (ref 6–23)
CO2: 29 meq/L (ref 19–32)
Calcium: 9.8 mg/dL (ref 8.4–10.5)
Chloride: 103 meq/L (ref 96–112)
Creatinine, Ser: 1.43 mg/dL (ref 0.40–1.50)
GFR: 46.05 mL/min — ABNORMAL LOW (ref >60.00)
Glucose, Bld: 139 mg/dL — ABNORMAL HIGH (ref 70–99)
Potassium: 4.3 meq/L (ref 3.5–5.1)
Sodium: 139 meq/L (ref 135–145)

## 2023-07-16 LAB — VITAMIN B12: Vitamin B-12: 864 pg/mL (ref 211–911)

## 2023-07-16 LAB — TSH: TSH: 1.23 u[IU]/mL (ref 0.35–5.50)

## 2023-07-16 LAB — URINALYSIS, ROUTINE W REFLEX MICROSCOPIC
Bilirubin Urine: NEGATIVE
Ketones, ur: NEGATIVE
Nitrite: POSITIVE — AB
Specific Gravity, Urine: 1.025 (ref 1.000–1.030)
Total Protein, Urine: 100 — AB
Urine Glucose: NEGATIVE
Urobilinogen, UA: 0.2 (ref 0.0–1.0)
pH: 6 (ref 5.0–8.0)

## 2023-07-16 LAB — VITAMIN D 25 HYDROXY (VIT D DEFICIENCY, FRACTURES): VITD: 56.11 ng/mL (ref 30.00–100.00)

## 2023-07-16 NOTE — Telephone Encounter (Signed)
Created in error

## 2023-07-16 NOTE — Telephone Encounter (Signed)
Never mind ignore that message.[name]

## 2023-07-16 NOTE — Patient Instructions (Signed)
Please continue all other medications as before, and refills have been done if requested.  Please have the pharmacy call with any other refills you may need.  Please continue your efforts at being more active, low cholesterol diet, and weight control.  Please keep your appointments with your specialists as you may have planned  Please make an Appointment to return in 6 months, or sooner if needed, also with Lab Appointment for testing done 3-5 days before at the FIRST FLOOR Lab (so this is for TWO appointments - please see the scheduling desk as you leave)  

## 2023-07-16 NOTE — Telephone Encounter (Signed)
Pt told me to let his nurse and dr know that he wants his labs ordered to the [facility] location.

## 2023-07-16 NOTE — Telephone Encounter (Signed)
Pt. Is on his way to the lab and wants

## 2023-07-16 NOTE — Progress Notes (Unsigned)
Patient ID: Neil [name], male   DOB: May 05, 1941, 82 y.o.   MRN: [id number]        Chief Complaint: follow up HTN, HLD, ckd3a, low 12 and D, dm       HPI:  Neil [name] is a 82 y.o. male here overall doing ok, Pt denies chest pain, increased sob or doe, wheezing, orthopnea, PND, increased LE swelling, palpitations, dizziness or syncope.   Pt denies polydipsia, polyuria, or new focal neuro s/s.    Pt denies fever, wt loss, night sweats, loss of appetite, or other constitutional symptoms  Does have several wks ongoing nasal allergy symptoms with clearish congestion, itch and sneezing, without fever, pain, ST, cough, swelling or wheezing.        Wt Readings from Last 3 Encounters:  07/16/23 159 lb (72.1 kg)  02/28/23 153 lb 9.6 oz (69.7 kg)  01/17/23 154 lb (69.9 kg)   BP Readings from Last 3 Encounters:  07/16/23 132/74  02/28/23 (!) 140/72  01/17/23 (!) 160/82         Past Medical History:  Diagnosis Date   ALLERGIC RHINITIS 12/04/2007   Anxiety    BENIGN PROSTATIC HYPERTROPHY 06/05/2007   BUNDLE BRANCH BLOCK, RIGHT 07/21/2009   BURSITIS, RIGHT SHOULDER 11/06/2010   Choledocholithiasis    CKD (chronic kidney disease) stage 3, GFR 30-59 ml/min (HCC) 08/02/2017   COLONIC POLYPS, HX OF 06/05/2007   tubular adenoma also 02/19/2013   Coronary artery calcification seen on CT scan 03/07/2016   Depression    DIABETES MELLITUS, TYPE [name] 12/04/2007   DIVERTICULOSIS, COLON 07/21/2009   ERECTILE DYSFUNCTION 06/05/2007   Gallstones    HYPERLIPIDEMIA 06/05/2007   on meds   HYPERTENSION 06/05/2007   on meds   Hypotension    INGUINAL HERNIA, RIGHT, SMALL 11/06/2010   Liver abscess 05/23/2015   SKIN LESION 06/03/2008   Past Surgical History:  Procedure Laterality Date   BIOPSY  09/08/2021   Procedure: BIOPSY;  Surgeon: Tressia Danas, MD;  Location: [facility];  Service: Endoscopy;;   CHOLECYSTECTOMY N/A 03/02/2015   Procedure: LAPAROSCOPIC CHOLECYSTECTOMY;  Surgeon:  Claud Kelp, MD;  Location: [facility];  Service: General;  Laterality: N/A;   COLONOSCOPY  2017   JP-MAC-suprep (good)-TA   CYSTOSCOPY N/A 05/29/2022   Procedure: Bluford Kaufmann;  Surgeon: Belva Agee, MD;  Location: [facility];  Service: Urology;  Laterality: N/A;   ERCP N/A 03/01/2015   Procedure: ENDOSCOPIC RETROGRADE CHOLANGIOPANCREATOGRAPHY (ERCP);  Surgeon: Hilarie Fredrickson, MD;  Location: [facility];  Service: Endoscopy;  Laterality: N/A;  Dr Derrell Lolling wants patient to stay overnight after ERCP   ERCP N/A 05/03/2015   Procedure: ENDOSCOPIC RETROGRADE CHOLANGIOPANCREATOGRAPHY (ERCP);  Surgeon: Hilarie Fredrickson, MD;  Location: [facility];  Service: Endoscopy;  Laterality: N/A;   FINGER SURGERY Left    "little finger"   FLEXIBLE SIGMOIDOSCOPY N/A 09/08/2021   Procedure: FLEXIBLE SIGMOIDOSCOPY;  Surgeon: Tressia Danas, MD;  Location: [facility];  Service: Endoscopy;  Laterality: N/A;   HEMORRHOID SURGERY  1973   INGUINAL HERNIA REPAIR Right 2014   8'14 repair   INSERTION OF SUPRAPUBIC CATHETER N/A 05/29/2022   Procedure: INSERTION OF SUPRAPUBIC CATHETER;  Surgeon: Belva Agee, MD;  Location: [facility];  Service: Urology;  Laterality: N/A;  30 MINS   POLYPECTOMY     TONSILLECTOMY     WISDOM TOOTH EXTRACTION  1972    reports that he quit smoking about 16 years ago. His smoking use included cigarettes. He  has never used smokeless tobacco. He reports that he does not currently use alcohol. He reports that he does not use drugs. family history includes Brain cancer (age of onset: 84) in his mother; Colon cancer in his paternal uncle; Colon cancer (age of onset: 29) in his mother; Colon polyps in his paternal uncle; Colon polyps (age of onset: 14) in his mother. Allergies  Allergen Reactions   Statins Other (See Comments)    Joint pain   Zanaflex [Tizanidine] Other (See Comments)    Dizzy, sleepy   Current Outpatient Medications on File Prior to Visit  Medication Sig Dispense Refill    acetaminophen (TYLENOL) 500 MG tablet Take 1,500 mg by mouth daily as needed for mild pain.     Alpha-D-Galactosidase (BEANO PO) Take 1 tablet by mouth at bedtime.     aspirin 81 MG EC tablet Take 81 mg by mouth daily.     benazepril (LOTENSIN) 40 MG tablet TAKE 1 TABLET BY MOUTH DAILY 100 tablet 2   Cholecalciferol (VITAMIN D) 50 MCG (2000 UT) CAPS Take 2,000 Units by mouth at bedtime.     citalopram (CELEXA) 10 MG tablet TAKE 1 TABLET BY MOUTH DAILY 90 tablet 3   cyclobenzaprine (FLEXERIL) 5 MG tablet TAKE 1 TABLET BY MOUTH 3 TIMES  DAILY AS NEEDED FOR MUSCLE  SPASM(S) 270 tablet 1   docusate sodium (COLACE) 100 MG capsule Take 100 mg by mouth daily.     ezetimibe (ZETIA) 10 MG tablet Take 1 tablet (10 mg total) by mouth daily. 90 tablet 3   fenofibrate 160 MG tablet TAKE 1 TABLET BY MOUTH AT  BEDTIME 100 tablet 2   finasteride (PROSCAR) 5 MG tablet TAKE 1 TABLET BY MOUTH DAILY 100 tablet 2   ibuprofen (ADVIL) 200 MG tablet Take 600 mg by mouth 2 (two) times daily as needed for moderate pain.     metFORMIN (GLUCOPHAGE-XR) 500 MG 24 hr tablet TAKE 1 TABLET BY MOUTH DAILY  WITH BREAKFAST 100 tablet 2   methenamine (HIPREX) 1 g tablet Take 1 tablet (1 g total) by mouth 2 (two) times daily. 180 tablet 3   metoprolol succinate (TOPROL-XL) 50 MG 24 hr tablet TAKE 1 TABLET BY MOUTH DAILY  WITH OR IMMEDIATELY FOLLOWING A  MEAL 60 tablet 5   Multiple Vitamin (MULTIVITAMIN WITH MINERALS) TABS tablet Take 1 tablet by mouth daily. 30 tablet 0   silodosin (RAPAFLO) 4 MG CAPS capsule Take 1 capsule (4 mg total) by mouth daily with breakfast. 90 capsule 3   sodium chloride (OCEAN) 0.65 % nasal spray Place 1 spray into the nose daily as needed for congestion.      terazosin (HYTRIN) 2 MG capsule Take 1 capsule (2 mg total) by mouth at bedtime. 90 capsule 3   traZODone (DESYREL) 50 MG tablet Take 0.5-1 tablets (25-50 mg total) by mouth at bedtime as needed. for sleep 90 tablet 1   No current  facility-administered medications on file prior to visit.        ROS:  All others reviewed and negative.  Objective        PE:  BP 132/74 (BP Location: Right Arm, Patient Position: Sitting, Cuff Size: Normal)   Pulse 65   Temp 98.1 F (36.7 C) (Oral)   Ht 5\' 8"  (1.727 m)   Wt 159 lb (72.1 kg)   SpO2 100%   BMI 24.18 kg/m  Constitutional: Pt appears in NAD               HENT: Head: NCAT.                Right Ear: External ear normal.                 Left Ear: External ear normal.                Eyes: . Pupils are equal, round, and reactive to light. Conjunctivae and EOM are normal               Nose: without d/c or deformity               Neck: Neck supple. Gross normal ROM               Cardiovascular: Normal rate and regular rhythm.                 Pulmonary/Chest: Effort normal and breath sounds without rales or wheezing.                Abd:  Soft, NT, ND, + BS, no organomegaly               Neurological: Pt is alert. At baseline orientation, motor grossly intact               Skin: Skin is warm. No rashes, no other new lesions, LE edema - none               Psychiatric: Pt behavior is normal without agitation   Micro: none  Cardiac tracings I have personally interpreted today:  none  Pertinent Radiological findings (summarize): none   Lab Results  Component Value Date   WBC 7.4 07/16/2023   HGB 13.8 07/16/2023   HCT 41.8 07/16/2023   PLT 252.0 07/16/2023   GLUCOSE 139 (H) 07/16/2023   CHOL 162 07/16/2023   TRIG 157.0 (H) 07/16/2023   HDL 48.00 07/16/2023   LDLDIRECT 106.0 05/18/2021   LDLCALC 82 07/16/2023   ALT 11 07/16/2023   AST 13 07/16/2023   NA 139 07/16/2023   K 4.3 07/16/2023   CL 103 07/16/2023   CREATININE 1.43 07/16/2023   BUN 18 07/16/2023   CO2 29 07/16/2023   TSH 1.23 07/16/2023   PSA 0.57 01/08/2023   INR 1.25 05/23/2015   HGBA1C 6.9 (H) 07/16/2023   MICROALBUR 49.9 (H) 07/16/2023   Assessment/Plan:  ILARIO MARLO [name] is  a 82 y.o. White or Caucasian [1] male with  has a past medical history of ALLERGIC RHINITIS (12/04/2007), Anxiety, BENIGN PROSTATIC HYPERTROPHY (06/05/2007), BUNDLE BRANCH BLOCK, RIGHT (07/21/2009), BURSITIS, RIGHT SHOULDER (11/06/2010), Choledocholithiasis, CKD (chronic kidney disease) stage 3, GFR 30-59 ml/min (HCC) (08/02/2017), COLONIC POLYPS, HX OF (06/05/2007), Coronary artery calcification seen on CT scan (03/07/2016), Depression, DIABETES MELLITUS, TYPE [name] (12/04/2007), DIVERTICULOSIS, COLON (07/21/2009), ERECTILE DYSFUNCTION (06/05/2007), Gallstones, HYPERLIPIDEMIA (06/05/2007), HYPERTENSION (06/05/2007), Hypotension, INGUINAL HERNIA, RIGHT, SMALL (11/06/2010), Liver abscess (05/23/2015), and SKIN LESION (06/03/2008).  B12 deficiency Lab Results  Component Value Date   VITAMINB12 864 07/16/2023   Stable, cont oral replacement - b12 1000 mcg qd  leb12   CKD (chronic kidney disease) stage 3, GFR 30-59 ml/min (HCC) Lab Results  Component Value Date   CREATININE 1.43 07/16/2023   Stable overall, cont to avoid nephrotoxins   Diabetes mellitus type 2 in nonobese [facility]) Lab Results  Component Value Date   HGBA1C 6.9 (H) 07/16/2023  Stable, pt to continue current medical treatment metformin Er 500 mg qd   Essential hypertension, benign BP Readings from Last 3 Encounters:  07/16/23 132/74  02/28/23 (!) 140/72  01/17/23 (!) 160/82   Stable, pt to continue medical treatment lotensin 40 every day, toprol xl 50 qd   Hyperlipidemia Lab Results  Component Value Date   LDLCALC 82 07/16/2023   Uncontrlled, goal ldl < 70, pt to continue current zetia 10 mg, declines statin or repatha for now   Vitamin D deficiency Last vitamin D Lab Results  Component Value Date   VD25OH 56.11 07/16/2023   Stable, cont oral replacement   Allergic rhinitis Mild to mod, for nasacort asd,,  to f/u any worsening symptoms or concerns  Followup: Return in about 6 months (around  01/14/2024).  Oliver Barre, MD 07/18/2023 9:09 PM [facility]

## 2023-07-18 ENCOUNTER — Encounter: Payer: Self-pay | Admitting: Internal Medicine

## 2023-07-18 NOTE — Assessment & Plan Note (Signed)
Mild to mod, for nasacort asd,,  to f/u any worsening symptoms or concerns 

## 2023-07-18 NOTE — Assessment & Plan Note (Signed)
Last vitamin D Lab Results  Component Value Date   VD25OH 56.11 07/16/2023   Stable, cont oral replacement

## 2023-07-18 NOTE — Assessment & Plan Note (Signed)
Lab Results  Component Value Date   HGBA1C 6.9 (H) 07/16/2023   Stable, pt to continue current medical treatment metformin Er 500 mg qd

## 2023-07-18 NOTE — Assessment & Plan Note (Signed)
Lab Results  Component Value Date   VITAMINB12 864 07/16/2023   Stable, cont oral replacement - b12 1000 mcg qd  leb12

## 2023-07-18 NOTE — Assessment & Plan Note (Signed)
Lab Results  Component Value Date   CREATININE 1.43 07/16/2023   Stable overall, cont to avoid nephrotoxins

## 2023-07-18 NOTE — Assessment & Plan Note (Signed)
BP Readings from Last 3 Encounters:  07/16/23 132/74  02/28/23 (!) 140/72  01/17/23 (!) 160/82   Stable, pt to continue medical treatment lotensin 40 every day, toprol xl 50 qd

## 2023-07-18 NOTE — Assessment & Plan Note (Signed)
Lab Results  Component Value Date   LDLCALC 82 07/16/2023   Uncontrlled, goal ldl < 70, pt to continue current zetia 10 mg, declines statin or repatha for now

## 2023-08-15 DIAGNOSIS — R338 Other retention of urine: Secondary | ICD-10-CM | POA: Diagnosis not present

## 2023-08-17 ENCOUNTER — Other Ambulatory Visit: Payer: Self-pay | Admitting: Internal Medicine

## 2023-08-19 ENCOUNTER — Other Ambulatory Visit: Payer: Self-pay

## 2023-08-30 ENCOUNTER — Ambulatory Visit (INDEPENDENT_AMBULATORY_CARE_PROVIDER_SITE_OTHER): Payer: 59

## 2023-08-30 DIAGNOSIS — Z0000 Encounter for general adult medical examination without abnormal findings: Secondary | ICD-10-CM

## 2023-08-30 NOTE — Patient Instructions (Signed)
Neil [name] , Thank you for taking time to come for your Medicare Wellness Visit. I appreciate your ongoing commitment to your health goals. Please review the following plan we discussed and let me know if I can assist you in the future.   Referrals/Orders/Follow-Ups/Clinician Recommendations: none  This is a list of the screening recommended for you and due dates:  Health Maintenance  Topic Date Due   Complete foot exam   01/14/2024   Hemoglobin A1C  01/14/2024   Eye exam for diabetics  06/08/2024   Yearly kidney function blood test for diabetes  07/15/2024   Yearly kidney health urinalysis for diabetes  07/15/2024   Medicare Annual Wellness Visit  08/29/2024   Colon Cancer Screening  07/06/2026   DTaP/Tdap/Td vaccine (3 - Td or Tdap) 01/17/2032   Pneumonia Vaccine  Completed   Flu Shot  Completed   Zoster (Shingles) Vaccine  Completed   HPV Vaccine  Aged Out   COVID-19 Vaccine  Discontinued    Advanced directives: (In Chart) A copy of your advanced directives are scanned into your chart should your provider ever need it.  Next Medicare Annual Wellness Visit scheduled for next year: Yes  insert Preventive Care attachment Insert FALL PREVENTION attachment if needed

## 2023-08-30 NOTE — Progress Notes (Signed)
Subjective:   Neil [name] is a 82 y.o. male who presents for Medicare Annual/Subsequent preventive examination.  Visit Complete: Virtual I connected with  Neil [name] on 08/30/23 by a audio enabled telemedicine application and verified that I am speaking with the correct person using two identifiers.  Patient Location: Home  Provider Location: Office/Clinic  I discussed the limitations of evaluation and management by telemedicine. The patient expressed understanding and agreed to proceed.  Vital Signs: Because this visit was a virtual/telehealth visit, some criteria may be missing or patient reported. Any vitals not documented were not able to be obtained and vitals that have been documented are patient reported.  Patient Medicare AWV questionnaire was completed by the patient on 08/27/2023; I have confirmed that all information answered by patient is correct and no changes since this date.  Cardiac Risk Factors include: advanced age (>75men, >46 women);diabetes mellitus;dyslipidemia;hypertension;male gender     Objective:    Today's Vitals   There is no height or weight on file to calculate BMI.     08/30/2023    3:17 PM 06/29/2022   10:46 AM 05/28/2022   10:09 AM 04/08/2022    9:42 AM 03/26/2022    1:07 AM 10/02/2021    1:13 AM 09/04/2021    9:14 PM  Advanced Directives  Does Patient Have a Medical Advance Directive? Yes Yes No No Yes Yes Yes  Type of Estate agent of [place];Living will Healthcare Power of [place];Living will   Living will Healthcare Power of [employer] Power of [place];Living will  Does patient want to make changes to medical advance directive?  No - Patient declined    No - Patient declined No - Patient declined  Copy of Healthcare Power of Attorney in Chart? Yes - validated most recent copy scanned in chart (See row information) Yes - validated most recent copy scanned in chart (See row information)     No - copy  requested  Would patient like information on creating a medical advance directive?    No - Patient declined       Current Medications (verified) Outpatient Encounter Medications as of 08/30/2023  Medication Sig   Alpha-D-Galactosidase (BEANO PO) Take 1 tablet by mouth at bedtime.   aspirin 81 MG EC tablet Take 81 mg by mouth daily.   benazepril (LOTENSIN) 40 MG tablet TAKE 1 TABLET BY MOUTH DAILY   Cholecalciferol (VITAMIN D) 50 MCG (2000 UT) CAPS Take 2,000 Units by mouth at bedtime.   citalopram (CELEXA) 10 MG tablet TAKE 1 TABLET BY MOUTH DAILY   cyclobenzaprine (FLEXERIL) 5 MG tablet TAKE 1 TABLET BY MOUTH 3 TIMES  DAILY AS NEEDED FOR MUSCLE  SPASM(S)   docusate sodium (COLACE) 100 MG capsule Take 100 mg by mouth daily.   ezetimibe (ZETIA) 10 MG tablet Take 1 tablet (10 mg total) by mouth daily.   fenofibrate 160 MG tablet TAKE 1 TABLET BY MOUTH AT  BEDTIME   finasteride (PROSCAR) 5 MG tablet TAKE 1 TABLET BY MOUTH DAILY   ibuprofen (ADVIL) 200 MG tablet Take 600 mg by mouth 2 (two) times daily as needed for moderate pain.   metFORMIN (GLUCOPHAGE-XR) 500 MG 24 hr tablet TAKE 1 TABLET BY MOUTH DAILY  WITH BREAKFAST   methenamine (HIPREX) 1 g tablet Take 1 tablet (1 g total) by mouth 2 (two) times daily.   metoprolol succinate (TOPROL-XL) 50 MG 24 hr tablet TAKE 1 TABLET BY MOUTH DAILY  WITH OR  IMMEDIATELY FOLLOWING A  MEAL   Multiple Vitamin (MULTIVITAMIN WITH MINERALS) TABS tablet Take 1 tablet by mouth daily.   silodosin (RAPAFLO) 4 MG CAPS capsule Take 1 capsule (4 mg total) by mouth daily with breakfast.   sodium chloride (OCEAN) 0.65 % nasal spray Place 1 spray into the nose daily as needed for congestion.    terazosin (HYTRIN) 2 MG capsule Take 1 capsule (2 mg total) by mouth at bedtime.   traZODone (DESYREL) 50 MG tablet TAKE 1/2 TO 1 TABLET BY MOUTH AT BEDTIME AS NEEDED FOR SLEEP   acetaminophen (TYLENOL) 500 MG tablet Take 1,500 mg by mouth daily as needed for mild pain.  (Patient not taking: Reported on 08/30/2023)   No facility-administered encounter medications on file as of 08/30/2023.    Allergies (verified) Statins and Zanaflex [tizanidine]   History: Past Medical History:  Diagnosis Date   ALLERGIC RHINITIS 12/04/2007   Anxiety    BENIGN PROSTATIC HYPERTROPHY 06/05/2007   BUNDLE BRANCH BLOCK, RIGHT 07/21/2009   BURSITIS, RIGHT SHOULDER 11/06/2010   Choledocholithiasis    CKD (chronic kidney disease) stage 3, GFR 30-59 ml/min (HCC) 08/02/2017   COLONIC POLYPS, HX OF 06/05/2007   tubular adenoma also 02/19/2013   Coronary artery calcification seen on CT scan 03/07/2016   Depression    DIABETES MELLITUS, TYPE [name] 12/04/2007   DIVERTICULOSIS, COLON 07/21/2009   ERECTILE DYSFUNCTION 06/05/2007   Gallstones    HYPERLIPIDEMIA 06/05/2007   on meds   HYPERTENSION 06/05/2007   on meds   Hypotension    INGUINAL HERNIA, RIGHT, SMALL 11/06/2010   Liver abscess 05/23/2015   SKIN LESION 06/03/2008   Past Surgical History:  Procedure Laterality Date   BIOPSY  09/08/2021   Procedure: BIOPSY;  Surgeon: Tressia Danas, MD;  Location: [facility];  Service: Endoscopy;;   CHOLECYSTECTOMY N/A 03/02/2015   Procedure: LAPAROSCOPIC CHOLECYSTECTOMY;  Surgeon: Claud Kelp, MD;  Location: [facility];  Service: General;  Laterality: N/A;   COLONOSCOPY  2017   JP-MAC-suprep (good)-TA   CYSTOSCOPY N/A 05/29/2022   Procedure: Bluford Kaufmann;  Surgeon: Belva Agee, MD;  Location: [facility];  Service: Urology;  Laterality: N/A;   ERCP N/A 03/01/2015   Procedure: ENDOSCOPIC RETROGRADE CHOLANGIOPANCREATOGRAPHY (ERCP);  Surgeon: Hilarie Fredrickson, MD;  Location: [facility];  Service: Endoscopy;  Laterality: N/A;  Dr Derrell Lolling wants patient to stay overnight after ERCP   ERCP N/A 05/03/2015   Procedure: ENDOSCOPIC RETROGRADE CHOLANGIOPANCREATOGRAPHY (ERCP);  Surgeon: Hilarie Fredrickson, MD;  Location: [facility];  Service: Endoscopy;  Laterality: N/A;   FINGER SURGERY Left     "little finger"   FLEXIBLE SIGMOIDOSCOPY N/A 09/08/2021   Procedure: FLEXIBLE SIGMOIDOSCOPY;  Surgeon: Tressia Danas, MD;  Location: [facility];  Service: Endoscopy;  Laterality: N/A;   HEMORRHOID SURGERY  1973   INGUINAL HERNIA REPAIR Right 2014   8'14 repair   INSERTION OF SUPRAPUBIC CATHETER N/A 05/29/2022   Procedure: INSERTION OF SUPRAPUBIC CATHETER;  Surgeon: Belva Agee, MD;  Location: [facility];  Service: Urology;  Laterality: N/A;  30 MINS   POLYPECTOMY     TONSILLECTOMY     WISDOM TOOTH EXTRACTION  1972   Family History  Problem Relation Age of Onset   Colon polyps Mother 5   Colon cancer Mother 28   Brain cancer Mother 50       mets from colon cancer   Colon cancer Paternal Uncle    Colon polyps Paternal Uncle    Rectal cancer Neg Hx  Stomach cancer Neg Hx    Esophageal cancer Neg Hx    Social History   Socioeconomic History   Marital status: Widowed    Spouse name: Not on file   Number of children: Not on file   Years of education: Not on file   Highest education level: Associate degree: occupational, Scientist, product/process development, or vocational program  Occupational History   Occupation: retired  Tobacco Use   Smoking status: Former    Current packs/day: 0.00    Types: Cigarettes    Quit date: 2008    Years since quitting: 16.8   Smokeless tobacco: Never  Vaping Use   Vaping status: Never Used  Substance and Sexual Activity   Alcohol use: Not Currently    Comment: occ beer but not in years   Drug use: No   Sexual activity: Not Currently  Other Topics Concern   Not on file  Social History Narrative   Not on file   Social Determinants of Health   Financial Resource Strain: Low Risk  (08/27/2023)   Overall Financial Resource Strain (CARDIA)    Difficulty of Paying Living Expenses: Not very hard  Food Insecurity: No Food Insecurity (08/27/2023)   Hunger Vital Sign    Worried About Running Out of Food in the Last Year: Never true    Ran Out of Food in  the Last Year: Never true  Transportation Needs: No Transportation Needs (08/27/2023)   PRAPARE - Administrator, Civil Service (Medical): No    Lack of Transportation (Non-Medical): No  Physical Activity: Insufficiently Active (08/27/2023)   Exercise Vital Sign    Days of Exercise per Week: 1 day    Minutes of Exercise per Session: 10 min  Stress: No Stress Concern Present (08/27/2023)   [employer] of Occupational Health - Occupational Stress Questionnaire    Feeling of Stress : Not at all  Social Connections: Moderately Isolated (08/27/2023)   Social Connection and Isolation Panel [NHANES]    Frequency of Communication with Friends and Family: Once a week    Frequency of Social Gatherings with Friends and Family: Once a week    Attends Religious Services: More than 4 times per year    Active Member of [employer] or Organizations: Yes    Attends Banker Meetings: More than 4 times per year    Marital Status: Widowed    Tobacco Counseling Counseling given: Not Answered   Clinical Intake:  Pre-visit preparation completed: Yes  Pain : No/denies pain     Nutritional Risks: None Diabetes: Yes CBG done?: No Did pt. bring in CBG monitor from home?: No  How often do you need to have someone help you when you read instructions, pamphlets, or other written materials from your doctor or pharmacy?: 1 - Never  Interpreter Needed?: No  Information entered by :: NAllen LPN   Activities of Daily Living    08/27/2023    4:54 PM 07/12/2023   12:39 PM  In your present state of health, do you have any difficulty performing the following activities:  Hearing? 0 0  Vision? 0 0  Difficulty concentrating or making decisions? 0 0  Walking or climbing stairs? 1 1  Dressing or bathing? 0 0  Doing errands, shopping? 0 0  Preparing Food and eating ? N N  Using the Toilet? N N  In the past six months, have you accidently leaked urine? N Y  Do you have problems  with loss of bowel control?  N N  Managing your Medications? N N  Managing your Finances? N N  Housekeeping or managing your Housekeeping? Y N    Patient Care Team: Corwin Levins, MD as PCP - Angelique Holm, Wilhemina Bonito, MD as Consulting Physician (Gastroenterology)  Indicate any recent Medical Services you may have received from other than [facility] providers in the past year (date may be approximate).     Assessment:   This is a routine wellness examination for Neil [name].  Hearing/Vision screen Hearing Screening - Comments:: Denies hearing issues Vision Screening - Comments:: Regular eye exams, [facility]   Goals Addressed             This Visit's Progress    Patient Stated       08/30/2023, maintain current health       Depression Screen    08/30/2023    3:20 PM 07/16/2023    2:35 PM 01/14/2023    2:39 PM 07/13/2022    1:24 PM 06/29/2022   10:45 AM 01/10/2022   11:28 AM 06/21/2021    6:12 PM  PHQ 2/9 Scores  PHQ - 2 Score 0 0 1  0 0 1  PHQ- 9 Score 0      2  Exception Documentation    Patient refusal       Fall Risk    08/27/2023    4:54 PM 07/16/2023    2:35 PM 07/12/2023   12:39 PM 07/04/2023   12:05 PM 01/14/2023    2:38 PM  Fall Risk   Falls in the past year? 0 0 0 0 1  Number falls in past yr: 0 0 0 0 0  Injury with Fall? 0 0 0 0 0  Risk for fall due to : Medication side effect;Impaired mobility;Impaired balance/gait No Fall Risks   Impaired balance/gait  Follow up Falls prevention discussed;Falls evaluation completed Falls evaluation completed   Falls evaluation completed    MEDICARE RISK AT HOME: Medicare Risk at Home Any stairs in or around the home?: No If so, are there any without handrails?: No Home free of loose throw rugs in walkways, pet beds, electrical cords, etc?: Yes Adequate lighting in your home to reduce risk of falls?: Yes Life alert?: Yes Use of a cane, walker or w/c?: Yes Grab bars in the bathroom?: Yes Shower chair or bench in shower?:  Yes Elevated toilet seat or a handicapped toilet?: Yes  TIMED UP AND GO:  Was the test performed?  No    Cognitive Function:        08/30/2023    3:20 PM 06/29/2022   10:47 AM 06/21/2021    6:19 PM  6CIT Screen  What Year? 0 points 0 points 0 points  What month? 0 points 0 points 0 points  What time? 0 points 0 points 0 points  Count back from 20 0 points 0 points 0 points  Months in reverse 0 points 0 points 0 points  Repeat phrase 0 points 2 points 0 points  Total Score 0 points 2 points 0 points    Immunizations Immunization History  Administered Date(s) Administered   Fluad Quad(high Dose 65+) 06/05/2019, 07/07/2020, 06/25/2022, 06/30/2023   Influenza Split 07/04/2012   Influenza Whole 07/21/2009, 11/06/2010   Influenza, High Dose Seasonal PF 07/03/2016, 08/02/2017, 07/10/2018   Influenza,inj,Quad PF,6+ Mos 06/24/2013, 06/25/2014, 07/12/2015   Influenza-Unspecified 07/11/2021   PFIZER(Purple Top)SARS-COV-2 Vaccination 10/27/2019, 11/16/2019, 07/10/2020, 01/19/2021, 01/01/2023   Pfizer Covid-19 Vaccine Bivalent Booster 38yrs & up 07/11/2021,  02/14/2022   Pneumococcal Conjugate-13 03/17/2014   Pneumococcal Polysaccharide-23 11/14/2011   Respiratory Syncytial Virus Vaccine,Recomb Aduvanted(Arexvy) 06/25/2022   Td 07/21/2009   Tdap 01/16/2022   Zoster Recombinant(Shingrix) 01/16/2022, 01/08/2023    TDAP status: Up to date  Flu Vaccine status: Up to date  Pneumococcal vaccine status: Up to date  Covid-19 vaccine status: Information provided on how to obtain vaccines.   Qualifies for Shingles Vaccine? Yes   Zostavax completed Yes   Shingrix Completed?: Yes  Screening Tests Health Maintenance  Topic Date Due   FOOT EXAM  01/14/2024   HEMOGLOBIN A1C  01/14/2024   OPHTHALMOLOGY EXAM  06/08/2024   Diabetic kidney evaluation - eGFR measurement  07/15/2024   Diabetic kidney evaluation - Urine ACR  07/15/2024   Medicare Annual Wellness (AWV)  08/29/2024    Colonoscopy  07/06/2026   DTaP/Tdap/Td (3 - Td or Tdap) 01/17/2032   Pneumonia Vaccine 67+ Years old  Completed   INFLUENZA VACCINE  Completed   Zoster Vaccines- Shingrix  Completed   HPV VACCINES  Aged Out   COVID-19 Vaccine  Discontinued    Health Maintenance  There are no preventive care reminders to display for this patient.   Colorectal cancer screening: No longer required.   Lung Cancer Screening: (Low Dose CT Chest recommended if Age 83-80 years, 20 pack-year currently smoking OR have quit w/in 15years.) does not qualify.   Lung Cancer Screening Referral: no  Additional Screening:  Hepatitis C Screening: does not qualify;   Vision Screening: Recommended annual ophthalmology exams for early detection of glaucoma and other disorders of the eye. Is the patient up to date with their annual eye exam?  Yes  Who is the provider or what is the name of the office in which the patient attends annual eye exams? [facility] If pt is not established with a provider, would they like to be referred to a provider to establish care? No .   Dental Screening: Recommended annual dental exams for proper oral hygiene  Diabetic Foot Exam: Diabetic Foot Exam: Completed 01/14/2023  Community Resource Referral / Chronic Care Management: CRR required this visit?  No   CCM required this visit?  No     Plan:     I have personally reviewed and noted the following in the patient's chart:   Medical and social history Use of alcohol, tobacco or illicit drugs  Current medications and supplements including opioid prescriptions. Patient is not currently taking opioid prescriptions. Functional ability and status Nutritional status Physical activity Advanced directives List of other physicians Hospitalizations, surgeries, and ER visits in previous 12 months Vitals Screenings to include cognitive, depression, and falls Referrals and appointments  In addition, I have reviewed and  discussed with patient certain preventive protocols, quality metrics, and best practice recommendations. A written personalized care plan for preventive services as well as general preventive health recommendations were provided to patient.     Barb Merino, LPN   [id number]   After Visit Summary: (MyChart) Due to this being a telephonic visit, the after visit summary with patients personalized plan was offered to patient via MyChart   Nurse Notes: none

## 2023-09-06 ENCOUNTER — Other Ambulatory Visit: Payer: Self-pay | Admitting: Internal Medicine

## 2023-09-09 ENCOUNTER — Other Ambulatory Visit: Payer: Self-pay

## 2023-09-18 DIAGNOSIS — R338 Other retention of urine: Secondary | ICD-10-CM | POA: Diagnosis not present

## 2023-10-08 ENCOUNTER — Other Ambulatory Visit: Payer: Self-pay | Admitting: Internal Medicine

## 2023-10-23 DIAGNOSIS — N319 Neuromuscular dysfunction of bladder, unspecified: Secondary | ICD-10-CM | POA: Diagnosis not present

## 2023-11-01 ENCOUNTER — Other Ambulatory Visit: Payer: Self-pay | Admitting: Internal Medicine

## 2023-11-01 ENCOUNTER — Other Ambulatory Visit: Payer: Self-pay

## 2023-11-21 ENCOUNTER — Other Ambulatory Visit: Payer: Self-pay | Admitting: Internal Medicine

## 2023-11-21 DIAGNOSIS — R3915 Urgency of urination: Secondary | ICD-10-CM | POA: Diagnosis not present

## 2023-11-21 DIAGNOSIS — N319 Neuromuscular dysfunction of bladder, unspecified: Secondary | ICD-10-CM | POA: Diagnosis not present

## 2023-12-04 ENCOUNTER — Other Ambulatory Visit: Payer: Self-pay | Admitting: Internal Medicine

## 2023-12-10 ENCOUNTER — Other Ambulatory Visit: Payer: Self-pay | Admitting: Internal Medicine

## 2023-12-19 DIAGNOSIS — R338 Other retention of urine: Secondary | ICD-10-CM | POA: Diagnosis not present

## 2024-01-04 ENCOUNTER — Encounter: Payer: Self-pay | Admitting: Internal Medicine

## 2024-01-07 MED ORDER — METHENAMINE HIPPURATE 1 G PO TABS: 1.0000 g | ORAL_TABLET | Freq: Two times a day (BID) | ORAL | 1 refills | Status: DC

## 2024-01-07 MED ORDER — TERAZOSIN HCL 2 MG PO CAPS: 2.0000 mg | ORAL_CAPSULE | Freq: Every day | ORAL | 1 refills | Status: DC

## 2024-01-07 MED ORDER — SILODOSIN 4 MG PO CAPS
4.0000 mg | ORAL_CAPSULE | Freq: Every day | ORAL | 1 refills | Status: DC
Start: 2024-01-07 — End: 2024-05-29

## 2024-01-09 ENCOUNTER — Other Ambulatory Visit (INDEPENDENT_AMBULATORY_CARE_PROVIDER_SITE_OTHER)

## 2024-01-09 DIAGNOSIS — E538 Deficiency of other specified B group vitamins: Secondary | ICD-10-CM

## 2024-01-09 DIAGNOSIS — E119 Type 2 diabetes mellitus without complications: Secondary | ICD-10-CM

## 2024-01-09 DIAGNOSIS — E559 Vitamin D deficiency, unspecified: Secondary | ICD-10-CM | POA: Diagnosis not present

## 2024-01-09 LAB — CBC WITH DIFFERENTIAL/PLATELET
Basophils Absolute: 0.1 10*3/uL (ref 0.0–0.1)
Basophils Relative: 0.9 % (ref 0.0–3.0)
Eosinophils Absolute: 0.4 10*3/uL (ref 0.0–0.7)
Eosinophils Relative: 5.4 % — ABNORMAL HIGH (ref 0.0–5.0)
HCT: 41.9 % (ref 39.0–52.0)
Hemoglobin: 13.8 g/dL (ref 13.0–17.0)
Lymphocytes Relative: 22.8 % (ref 12.0–46.0)
Lymphs Abs: 1.9 10*3/uL (ref 0.7–4.0)
MCHC: 32.9 g/dL (ref 30.0–36.0)
MCV: 87.4 fl (ref 78.0–100.0)
Monocytes Absolute: 0.8 10*3/uL (ref 0.1–1.0)
Monocytes Relative: 10 % (ref 3.0–12.0)
Neutro Abs: 5 10*3/uL (ref 1.4–7.7)
Neutrophils Relative %: 60.9 % (ref 43.0–77.0)
Platelets: 266 10*3/uL (ref 150.0–400.0)
RBC: 4.8 Mil/uL (ref 4.22–5.81)
RDW: 14.5 % (ref 11.5–15.5)
WBC: 8.2 10*3/uL (ref 4.0–10.5)

## 2024-01-09 LAB — BASIC METABOLIC PANEL WITH GFR
BUN: 21 mg/dL (ref 6–23)
CO2: 27 meq/L (ref 19–32)
Calcium: 9.8 mg/dL (ref 8.4–10.5)
Chloride: 105 meq/L (ref 96–112)
Creatinine, Ser: 1.47 mg/dL (ref 0.40–1.50)
GFR: 44.4 mL/min — ABNORMAL LOW (ref >60.00)
Glucose, Bld: 132 mg/dL — ABNORMAL HIGH (ref 70–99)
Potassium: 4.6 meq/L (ref 3.5–5.1)
Sodium: 140 meq/L (ref 135–145)

## 2024-01-09 LAB — LIPID PANEL
Cholesterol: 159 mg/dL (ref 0–200)
HDL: 35.8 mg/dL — ABNORMAL LOW (ref >39.00)
LDL Cholesterol: 86 mg/dL (ref 0–99)
NonHDL: 123.01
Total CHOL/HDL Ratio: 4
Triglycerides: 185 mg/dL — ABNORMAL HIGH (ref 0.0–149.0)
VLDL: 37 mg/dL (ref 0.0–40.0)

## 2024-01-09 LAB — URINALYSIS, ROUTINE W REFLEX MICROSCOPIC
Bilirubin Urine: NEGATIVE
Ketones, ur: NEGATIVE
Nitrite: POSITIVE — AB
Specific Gravity, Urine: 1.025 (ref 1.000–1.030)
Total Protein, Urine: 100 — AB
Urine Glucose: NEGATIVE
Urobilinogen, UA: 0.2 (ref 0.0–1.0)
pH: 6 (ref 5.0–8.0)

## 2024-01-09 LAB — HEPATIC FUNCTION PANEL
ALT: 9 U/L (ref 0–53)
AST: 12 U/L (ref 0–37)
Albumin: 4.2 g/dL (ref 3.5–5.2)
Alkaline Phosphatase: 25 U/L — ABNORMAL LOW (ref 39–117)
Bilirubin, Direct: 0.1 mg/dL (ref 0.0–0.3)
Total Bilirubin: 0.5 mg/dL (ref 0.2–1.2)
Total Protein: 7 g/dL (ref 6.0–8.3)

## 2024-01-09 LAB — HEMOGLOBIN A1C: Hgb A1c MFr Bld: 7 % — ABNORMAL HIGH (ref 4.6–6.5)

## 2024-01-09 LAB — MICROALBUMIN / CREATININE URINE RATIO
Creatinine,U: 120.6 mg/dL
Microalb Creat Ratio: 611.2 mg/g — ABNORMAL HIGH (ref 0.0–30.0)
Microalb, Ur: 73.7 mg/dL — ABNORMAL HIGH (ref 0.0–1.9)

## 2024-01-10 LAB — TSH: TSH: 1.42 u[IU]/mL (ref 0.35–5.50)

## 2024-01-10 LAB — VITAMIN D 25 HYDROXY (VIT D DEFICIENCY, FRACTURES): VITD: 67.02 ng/mL (ref 30.00–100.00)

## 2024-01-10 LAB — VITAMIN B12: Vitamin B-12: 887 pg/mL (ref 211–911)

## 2024-01-14 ENCOUNTER — Encounter: Payer: Self-pay | Admitting: Internal Medicine

## 2024-01-14 ENCOUNTER — Ambulatory Visit (INDEPENDENT_AMBULATORY_CARE_PROVIDER_SITE_OTHER): Payer: 59 | Admitting: Internal Medicine

## 2024-01-14 VITALS — BP 130/62 | HR 50 | Temp 98.0°F | Ht 68.0 in | Wt 162.0 lb

## 2024-01-14 DIAGNOSIS — Z7984 Long term (current) use of oral hypoglycemic drugs: Secondary | ICD-10-CM

## 2024-01-14 DIAGNOSIS — E782 Mixed hyperlipidemia: Secondary | ICD-10-CM | POA: Diagnosis not present

## 2024-01-14 DIAGNOSIS — Z0000 Encounter for general adult medical examination without abnormal findings: Secondary | ICD-10-CM | POA: Diagnosis not present

## 2024-01-14 DIAGNOSIS — E559 Vitamin D deficiency, unspecified: Secondary | ICD-10-CM | POA: Diagnosis not present

## 2024-01-14 DIAGNOSIS — Z0001 Encounter for general adult medical examination with abnormal findings: Secondary | ICD-10-CM

## 2024-01-14 DIAGNOSIS — E119 Type 2 diabetes mellitus without complications: Secondary | ICD-10-CM

## 2024-01-14 DIAGNOSIS — I10 Essential (primary) hypertension: Secondary | ICD-10-CM | POA: Diagnosis not present

## 2024-01-14 DIAGNOSIS — H9313 Tinnitus, bilateral: Secondary | ICD-10-CM | POA: Diagnosis not present

## 2024-01-14 DIAGNOSIS — E538 Deficiency of other specified B group vitamins: Secondary | ICD-10-CM | POA: Diagnosis not present

## 2024-01-14 NOTE — Patient Instructions (Signed)
Please continue all other medications as before, and refills have been done if requested.  Please have the pharmacy call with any other refills you may need.  Please continue your efforts at being more active, low cholesterol diet, and weight control.  You are otherwise up to date with prevention measures today.  Please keep your appointments with your specialists as you may have planned  Please make an Appointment to return in 6 months, or sooner if needed, also with Lab Appointment for testing done 3-5 days before at the FIRST FLOOR Lab (so this is for TWO appointments - please see the scheduling desk as you leave)  

## 2024-01-14 NOTE — Progress Notes (Unsigned)
 Chief Complaint:: wellness exam and chronic tinnitus, hld, dm, htn, low vit d       HPI:  Neil [name] is a 83 y.o. male here for wellness exam; up to date                        Also Pt denies chest pain, increased sob or doe, wheezing, orthopnea, PND, increased LE swelling, palpitations, dizziness or syncope.   Pt denies polydipsia, polyuria, or new focal neuro s/s.    Pt denies fever, wt loss, night sweats, loss of appetite, or other constitutional symptoms  Dedclines statin.  Has chronic tinnitus some worsening recently but no vertigo or worsening hearing loss.  Wt Readings from Last 3 Encounters:  01/14/24 162 lb (73.5 kg)  07/16/23 159 lb (72.1 kg)  02/28/23 153 lb 9.6 oz (69.7 kg)   BP Readings from Last 3 Encounters:  01/14/24 130/62  07/16/23 132/74  02/28/23 (!) 140/72   Immunization History  Administered Date(s) Administered   Fluad Quad(high Dose 65+) 06/05/2019, 07/07/2020, 06/25/2022, 06/30/2023   Influenza Split 07/04/2012   Influenza Whole 07/21/2009, 11/06/2010   Influenza, High Dose Seasonal PF 07/03/2016, 08/02/2017, 07/10/2018   Influenza,inj,Quad PF,6+ Mos 06/24/2013, 06/25/2014, 07/12/2015   Influenza-Unspecified 07/11/2021   PFIZER(Purple Top)SARS-COV-2 Vaccination 10/27/2019, 11/16/2019, 07/10/2020, 01/19/2021, 01/01/2023   Pfizer Covid-19 Vaccine Bivalent Booster 91yrs & up 07/11/2021, 02/14/2022   Pneumococcal Conjugate-13 03/17/2014   Pneumococcal Polysaccharide-23 11/14/2011   Respiratory Syncytial Virus Vaccine,Recomb Aduvanted(Arexvy) 06/25/2022   Td 07/21/2009   Tdap 01/16/2022   Zoster Recombinant(Shingrix) 01/16/2022, 01/08/2023   There are no preventive care reminders to display for this patient.     Past Medical History:  Diagnosis Date   ALLERGIC RHINITIS 12/04/2007   Anxiety    BENIGN PROSTATIC HYPERTROPHY 06/05/2007   BUNDLE BRANCH BLOCK, RIGHT 07/21/2009   BURSITIS, RIGHT SHOULDER 11/06/2010   Choledocholithiasis    CKD  (chronic kidney disease) stage 3, GFR 30-59 ml/min (HCC) 08/02/2017   COLONIC POLYPS, HX OF 06/05/2007   tubular adenoma also 02/19/2013   Coronary artery calcification seen on CT scan 03/07/2016   Depression    DIABETES MELLITUS, TYPE [name] 12/04/2007   DIVERTICULOSIS, COLON 07/21/2009   ERECTILE DYSFUNCTION 06/05/2007   Gallstones    HYPERLIPIDEMIA 06/05/2007   on meds   HYPERTENSION 06/05/2007   on meds   Hypotension    INGUINAL HERNIA, RIGHT, SMALL 11/06/2010   Liver abscess 05/23/2015   SKIN LESION 06/03/2008   Past Surgical History:  Procedure Laterality Date   BIOPSY  09/08/2021   Procedure: BIOPSY;  Surgeon: Tressia Danas, MD;  Location: [facility];  Service: Endoscopy;;   CHOLECYSTECTOMY N/A 03/02/2015   Procedure: LAPAROSCOPIC CHOLECYSTECTOMY;  Surgeon: Claud Kelp, MD;  Location: [facility];  Service: General;  Laterality: N/A;   COLONOSCOPY  2017   JP-MAC-suprep (good)-TA   CYSTOSCOPY N/A 05/29/2022   Procedure: Bluford Kaufmann;  Surgeon: Belva Agee, MD;  Location: [facility];  Service: Urology;  Laterality: N/A;   ERCP N/A 03/01/2015   Procedure: ENDOSCOPIC RETROGRADE CHOLANGIOPANCREATOGRAPHY (ERCP);  Surgeon: Hilarie Fredrickson, MD;  Location: [facility];  Service: Endoscopy;  Laterality: N/A;  Dr Derrell Lolling wants patient to stay overnight after ERCP   ERCP N/A 05/03/2015   Procedure: ENDOSCOPIC RETROGRADE CHOLANGIOPANCREATOGRAPHY (ERCP);  Surgeon: Hilarie Fredrickson, MD;  Location: [facility];  Service: Endoscopy;  Laterality: N/A;   FINGER SURGERY Left    "little finger"  FLEXIBLE SIGMOIDOSCOPY N/A 09/08/2021   Procedure: FLEXIBLE SIGMOIDOSCOPY;  Surgeon: Tressia Danas, MD;  Location: [facility];  Service: Endoscopy;  Laterality: N/A;   HEMORRHOID SURGERY  1973   INGUINAL HERNIA REPAIR Right 2014   8'14 repair   INSERTION OF SUPRAPUBIC CATHETER N/A 05/29/2022   Procedure: INSERTION OF SUPRAPUBIC CATHETER;  Surgeon: Belva Agee, MD;  Location: [facility];  Service:  Urology;  Laterality: N/A;  30 MINS   POLYPECTOMY     TONSILLECTOMY     WISDOM TOOTH EXTRACTION  1972    reports that he quit smoking about 17 years ago. His smoking use included cigarettes. He has never used smokeless tobacco. He reports that he does not currently use alcohol. He reports that he does not use drugs. family history includes Brain cancer (age of onset: 64) in his mother; Colon cancer in his paternal uncle; Colon cancer (age of onset: 58) in his mother; Colon polyps in his paternal uncle; Colon polyps (age of onset: 28) in his mother. Allergies  Allergen Reactions   Statins Other (See Comments)    Joint pain   Zanaflex [Tizanidine] Other (See Comments)    Dizzy, sleepy   Current Outpatient Medications on File Prior to Visit  Medication Sig Dispense Refill   Alpha-D-Galactosidase (BEANO PO) Take 1 tablet by mouth at bedtime.     aspirin 81 MG EC tablet Take 81 mg by mouth daily.     benazepril (LOTENSIN) 40 MG tablet TAKE 1 TABLET BY MOUTH DAILY 100 tablet 2   Cholecalciferol (VITAMIN D) 50 MCG (2000 UT) CAPS Take 2,000 Units by mouth at bedtime.     citalopram (CELEXA) 10 MG tablet TAKE 1 TABLET BY MOUTH DAILY 90 tablet 3   cyclobenzaprine (FLEXERIL) 5 MG tablet TAKE 1 TABLET BY MOUTH 3 TIMES  DAILY AS NEEDED FOR MUSCLE  SPASM(S) 270 tablet 1   docusate sodium (COLACE) 100 MG capsule Take 100 mg by mouth daily.     ezetimibe (ZETIA) 10 MG tablet Take 1 tablet (10 mg total) by mouth daily. 90 tablet 3   fenofibrate 160 MG tablet TAKE 1 TABLET BY MOUTH AT  BEDTIME 100 tablet 2   finasteride (PROSCAR) 5 MG tablet TAKE 1 TABLET BY MOUTH DAILY 100 tablet 2   ibuprofen (ADVIL) 200 MG tablet Take 600 mg by mouth 2 (two) times daily as needed for moderate pain.     metFORMIN (GLUCOPHAGE-XR) 500 MG 24 hr tablet TAKE 1 TABLET BY MOUTH DAILY  WITH BREAKFAST 100 tablet 2   methenamine (HIPREX) 1 g tablet Take 1 tablet (1 g total) by mouth 2 (two) times daily. 180 tablet 1   metoprolol  succinate (TOPROL-XL) 50 MG 24 hr tablet TAKE 1 TABLET BY MOUTH DAILY  WITH OR IMMEDIATELY FOLLOWING A  MEAL 100 tablet 2   Multiple Vitamin (MULTIVITAMIN WITH MINERALS) TABS tablet Take 1 tablet by mouth daily. 30 tablet 0   oxybutynin (DITROPAN-XL) 10 MG 24 hr tablet Take 10 mg by mouth daily.     silodosin (RAPAFLO) 4 MG CAPS capsule Take 1 capsule (4 mg total) by mouth daily with breakfast. 90 capsule 1   sodium chloride (OCEAN) 0.65 % nasal spray Place 1 spray into the nose daily as needed for congestion.      terazosin (HYTRIN) 2 MG capsule Take 1 capsule (2 mg total) by mouth at bedtime. 90 capsule 1   traZODone (DESYREL) 50 MG tablet TAKE 1/2 TO 1 TABLET BY MOUTH AT BEDTIME  AS NEEDED FOR SLEEP 90 tablet 3   acetaminophen (TYLENOL) 500 MG tablet Take 1,500 mg by mouth daily as needed for mild pain. (Patient not taking: Reported on 08/30/2023)     MYRBETRIQ 25 MG TB24 tablet Take 25 mg by mouth daily. (Patient not taking: Reported on 01/14/2024)     No current facility-administered medications on file prior to visit.        ROS:  All others reviewed and negative.  Objective        PE:  BP 130/62 (BP Location: Right Arm, Patient Position: Sitting, Cuff Size: Normal)   Pulse (!) 50   Temp 98 F (36.7 C) (Oral)   Ht 5\' 8"  (1.727 m)   Wt 162 lb (73.5 kg)   SpO2 98%   BMI 24.63 kg/m                 Constitutional: Pt appears in NAD               HENT: Head: NCAT.                Right Ear: External ear normal.                 Left Ear: External ear normal.                Eyes: . Pupils are equal, round, and reactive to light. Conjunctivae and EOM are normal               Nose: without d/c or deformity               Neck: Neck supple. Gross normal ROM               Cardiovascular: Normal rate and regular rhythm.                 Pulmonary/Chest: Effort normal and breath sounds without rales or wheezing.                Abd:  Soft, NT, ND, + BS, no organomegaly                Neurological: Pt is alert. At baseline orientation, motor grossly intact               Skin: Skin is warm. No rashes, no other new lesions, LE edema - none               Psychiatric: Pt behavior is normal without agitation   Micro: none  Cardiac tracings I have personally interpreted today:  none  Pertinent Radiological findings (summarize): none   Lab Results  Component Value Date   WBC 8.2 01/09/2024   HGB 13.8 01/09/2024   HCT 41.9 01/09/2024   PLT 266.0 01/09/2024   GLUCOSE 132 (H) 01/09/2024   CHOL 159 01/09/2024   TRIG 185.0 (H) 01/09/2024   HDL 35.80 (L) 01/09/2024   LDLDIRECT 106.0 05/18/2021   LDLCALC 86 01/09/2024   ALT 9 01/09/2024   AST 12 01/09/2024   NA 140 01/09/2024   K 4.6 01/09/2024   CL 105 01/09/2024   CREATININE 1.47 01/09/2024   BUN 21 01/09/2024   CO2 27 01/09/2024   TSH 1.42 01/09/2024   PSA 0.57 01/08/2023   INR 1.25 05/23/2015   HGBA1C 7.0 (H) 01/09/2024   MICROALBUR 73.7 (H) 01/09/2024   Assessment/Plan:  Neil [name] is a 83 y.o. White or Caucasian [1] male with  has a past  medical history of ALLERGIC RHINITIS (12/04/2007), Anxiety, BENIGN PROSTATIC HYPERTROPHY (06/05/2007), BUNDLE BRANCH BLOCK, RIGHT (07/21/2009), BURSITIS, RIGHT SHOULDER (11/06/2010), Choledocholithiasis, CKD (chronic kidney disease) stage 3, GFR 30-59 ml/min (HCC) (08/02/2017), COLONIC POLYPS, HX OF (06/05/2007), Coronary artery calcification seen on CT scan (03/07/2016), Depression, DIABETES MELLITUS, TYPE [name] (12/04/2007), DIVERTICULOSIS, COLON (07/21/2009), ERECTILE DYSFUNCTION (06/05/2007), Gallstones, HYPERLIPIDEMIA (06/05/2007), HYPERTENSION (06/05/2007), Hypotension, INGUINAL HERNIA, RIGHT, SMALL (11/06/2010), Liver abscess (05/23/2015), and SKIN LESION (06/03/2008).  Encounter for well adult exam with abnormal findings Age and sex appropriate education and counseling updated with regular exercise and diet Referrals for preventative services - none  needed Immunizations addressed - none needed Smoking counseling  - none needed Evidence for depression or other mood disorder - none significant Most recent labs reviewed. I have personally reviewed and have noted: 1) the patient's medical and social history 2) The patient's current medications and supplements 3) The patient's height, weight, and BMI have been recorded in the chart   Hyperlipidemia Lab Results  Component Value Date   LDLCALC 86 01/09/2024   uncontrolled, pt declines statin, cont lower chol diet, zetia 10 qd  Essential hypertension, benign BP Readings from Last 3 Encounters:  01/14/24 130/62  07/16/23 132/74  02/28/23 (!) 140/72   Stable, pt to continue medical treatment lotensin 40 qd   Vitamin D deficiency Last vitamin D Lab Results  Component Value Date   VD25OH 67.02 01/09/2024   Stable, cont oral replacement   Diabetes mellitus type 2 in nonobese [facility]) Lab Results  Component Value Date   HGBA1C 7.0 (H) 01/09/2024   Stable, pt to continue current medical treatment metformin eR 500 qd   B12 deficiency Lab Results  Component Value Date   VITAMINB12 887 01/09/2024   Stable, cont oral replacement - b12 1000 mcg qd   Ear ringing sound Chronic stable, cont follow  Followup: Return in about 6 months (around 07/15/2024).  Oliver Barre, MD 01/17/2024 10:23 PM [facility] - [facility]

## 2024-01-17 ENCOUNTER — Encounter: Payer: Self-pay | Admitting: Internal Medicine

## 2024-01-17 NOTE — Assessment & Plan Note (Addendum)
 Lab Results  Component Value Date   LDLCALC 86 01/09/2024   uncontrolled, pt declines statin, cont lower chol diet, zetia 10 qd

## 2024-01-17 NOTE — Assessment & Plan Note (Signed)

## 2024-01-17 NOTE — Assessment & Plan Note (Signed)
 Last vitamin D Lab Results  Component Value Date   VD25OH 67.02 01/09/2024   Stable, cont oral replacement

## 2024-01-17 NOTE — Assessment & Plan Note (Signed)
 BP Readings from Last 3 Encounters:  01/14/24 130/62  07/16/23 132/74  02/28/23 (!) 140/72   Stable, pt to continue medical treatment lotensin 40 qd

## 2024-01-17 NOTE — Addendum Note (Signed)
 Addended by: Corwin Levins on: 01/17/2024 10:25 PM   Modules accepted: Orders

## 2024-01-17 NOTE — Assessment & Plan Note (Signed)
 Lab Results  Component Value Date   HGBA1C 7.0 (H) 01/09/2024   Stable, pt to continue current medical treatment metformin eR 500 qd

## 2024-01-17 NOTE — Assessment & Plan Note (Signed)
 Lab Results  Component Value Date   VITAMINB12 887 01/09/2024   Stable, cont oral replacement - b12 1000 mcg qd

## 2024-01-17 NOTE — Assessment & Plan Note (Signed)
 Chronic stable, cont follow

## 2024-01-23 DIAGNOSIS — R338 Other retention of urine: Secondary | ICD-10-CM | POA: Diagnosis not present

## 2024-02-05 ENCOUNTER — Other Ambulatory Visit: Payer: Self-pay | Admitting: Internal Medicine

## 2024-02-06 ENCOUNTER — Other Ambulatory Visit: Payer: Self-pay

## 2024-02-20 DIAGNOSIS — R338 Other retention of urine: Secondary | ICD-10-CM | POA: Diagnosis not present

## 2024-03-08 ENCOUNTER — Other Ambulatory Visit: Payer: Self-pay | Admitting: Internal Medicine

## 2024-03-10 ENCOUNTER — Other Ambulatory Visit: Payer: Self-pay

## 2024-03-12 ENCOUNTER — Other Ambulatory Visit: Payer: Self-pay | Admitting: Internal Medicine

## 2024-03-19 DIAGNOSIS — R338 Other retention of urine: Secondary | ICD-10-CM | POA: Diagnosis not present

## 2024-04-20 DIAGNOSIS — R338 Other retention of urine: Secondary | ICD-10-CM | POA: Diagnosis not present

## 2024-04-26 ENCOUNTER — Inpatient Hospital Stay (HOSPITAL_COMMUNITY)
Admission: EM | Admit: 2024-04-26 | Discharge: 2024-05-03 | DRG: 699 | Disposition: A | Discharge status: Home / Self Care | Attending: Family Medicine | Admitting: Family Medicine

## 2024-04-26 ENCOUNTER — Encounter (HOSPITAL_COMMUNITY): Payer: Self-pay

## 2024-04-26 ENCOUNTER — Other Ambulatory Visit: Payer: Self-pay

## 2024-04-26 DIAGNOSIS — N179 Acute kidney failure, unspecified: Secondary | ICD-10-CM | POA: Diagnosis not present

## 2024-04-26 DIAGNOSIS — E785 Hyperlipidemia, unspecified: Secondary | ICD-10-CM | POA: Diagnosis present

## 2024-04-26 DIAGNOSIS — N390 Urinary tract infection, site not specified: Secondary | ICD-10-CM | POA: Diagnosis not present

## 2024-04-26 DIAGNOSIS — E8721 Acute metabolic acidosis: Secondary | ICD-10-CM | POA: Diagnosis present

## 2024-04-26 DIAGNOSIS — I499 Cardiac arrhythmia, unspecified: Secondary | ICD-10-CM | POA: Diagnosis not present

## 2024-04-26 DIAGNOSIS — R2681 Unsteadiness on feet: Secondary | ICD-10-CM | POA: Diagnosis not present

## 2024-04-26 DIAGNOSIS — N1831 Chronic kidney disease, stage 3a: Secondary | ICD-10-CM | POA: Diagnosis present

## 2024-04-26 DIAGNOSIS — B965 Pseudomonas (aeruginosa) (mallei) (pseudomallei) as the cause of diseases classified elsewhere: Secondary | ICD-10-CM | POA: Diagnosis present

## 2024-04-26 DIAGNOSIS — E871 Hypo-osmolality and hyponatremia: Secondary | ICD-10-CM | POA: Diagnosis not present

## 2024-04-26 DIAGNOSIS — R531 Weakness: Secondary | ICD-10-CM | POA: Diagnosis not present

## 2024-04-26 DIAGNOSIS — R296 Repeated falls: Secondary | ICD-10-CM | POA: Diagnosis not present

## 2024-04-26 DIAGNOSIS — I10 Essential (primary) hypertension: Secondary | ICD-10-CM | POA: Diagnosis present

## 2024-04-26 DIAGNOSIS — I2510 Atherosclerotic heart disease of native coronary artery without angina pectoris: Secondary | ICD-10-CM | POA: Diagnosis present

## 2024-04-26 DIAGNOSIS — E1122 Type 2 diabetes mellitus with diabetic chronic kidney disease: Secondary | ICD-10-CM | POA: Diagnosis not present

## 2024-04-26 DIAGNOSIS — K4090 Unilateral inguinal hernia, without obstruction or gangrene, not specified as recurrent: Secondary | ICD-10-CM | POA: Diagnosis not present

## 2024-04-26 DIAGNOSIS — Z7982 Long term (current) use of aspirin: Secondary | ICD-10-CM

## 2024-04-26 DIAGNOSIS — N138 Other obstructive and reflux uropathy: Secondary | ICD-10-CM | POA: Diagnosis not present

## 2024-04-26 DIAGNOSIS — R6889 Other general symptoms and signs: Secondary | ICD-10-CM | POA: Diagnosis not present

## 2024-04-26 DIAGNOSIS — S5002XA Contusion of left elbow, initial encounter: Secondary | ICD-10-CM | POA: Diagnosis present

## 2024-04-26 DIAGNOSIS — Z1623 Resistance to quinolones and fluoroquinolones: Secondary | ICD-10-CM | POA: Diagnosis present

## 2024-04-26 DIAGNOSIS — T83518A Infection and inflammatory reaction due to other urinary catheter, initial encounter: Principal | ICD-10-CM | POA: Diagnosis present

## 2024-04-26 DIAGNOSIS — Z87891 Personal history of nicotine dependence: Secondary | ICD-10-CM

## 2024-04-26 DIAGNOSIS — E875 Hyperkalemia: Secondary | ICD-10-CM | POA: Diagnosis present

## 2024-04-26 DIAGNOSIS — Y846 Urinary catheterization as the cause of abnormal reaction of the patient, or of later complication, without mention of misadventure at the time of the procedure: Secondary | ICD-10-CM | POA: Diagnosis present

## 2024-04-26 DIAGNOSIS — Z79899 Other long term (current) drug therapy: Secondary | ICD-10-CM | POA: Diagnosis not present

## 2024-04-26 DIAGNOSIS — K5730 Diverticulosis of large intestine without perforation or abscess without bleeding: Secondary | ICD-10-CM | POA: Diagnosis not present

## 2024-04-26 DIAGNOSIS — M25551 Pain in right hip: Secondary | ICD-10-CM | POA: Diagnosis not present

## 2024-04-26 DIAGNOSIS — I129 Hypertensive chronic kidney disease with stage 1 through stage 4 chronic kidney disease, or unspecified chronic kidney disease: Secondary | ICD-10-CM | POA: Diagnosis not present

## 2024-04-26 DIAGNOSIS — N200 Calculus of kidney: Secondary | ICD-10-CM | POA: Diagnosis present

## 2024-04-26 DIAGNOSIS — F32A Depression, unspecified: Secondary | ICD-10-CM | POA: Diagnosis present

## 2024-04-26 DIAGNOSIS — D649 Anemia, unspecified: Secondary | ICD-10-CM | POA: Diagnosis present

## 2024-04-26 DIAGNOSIS — N401 Enlarged prostate with lower urinary tract symptoms: Secondary | ICD-10-CM | POA: Diagnosis present

## 2024-04-26 DIAGNOSIS — D631 Anemia in chronic kidney disease: Secondary | ICD-10-CM | POA: Diagnosis not present

## 2024-04-26 DIAGNOSIS — E119 Type 2 diabetes mellitus without complications: Secondary | ICD-10-CM

## 2024-04-26 DIAGNOSIS — S0990XA Unspecified injury of head, initial encounter: Secondary | ICD-10-CM | POA: Diagnosis not present

## 2024-04-26 DIAGNOSIS — N400 Enlarged prostate without lower urinary tract symptoms: Secondary | ICD-10-CM | POA: Diagnosis present

## 2024-04-26 DIAGNOSIS — W19XXXA Unspecified fall, initial encounter: Secondary | ICD-10-CM | POA: Diagnosis present

## 2024-04-26 NOTE — ED Triage Notes (Signed)
 PT bib [facility]. Per EMS PT had 2 falls today, PT denies neck/back pain. PT stated that he is having Generalized weakness. PT lives alone at home.

## 2024-04-27 ENCOUNTER — Emergency Department (HOSPITAL_COMMUNITY)

## 2024-04-27 DIAGNOSIS — E875 Hyperkalemia: Secondary | ICD-10-CM | POA: Diagnosis present

## 2024-04-27 DIAGNOSIS — E871 Hypo-osmolality and hyponatremia: Secondary | ICD-10-CM | POA: Diagnosis present

## 2024-04-27 DIAGNOSIS — N138 Other obstructive and reflux uropathy: Secondary | ICD-10-CM | POA: Diagnosis present

## 2024-04-27 DIAGNOSIS — N1831 Chronic kidney disease, stage 3a: Secondary | ICD-10-CM

## 2024-04-27 DIAGNOSIS — F32A Depression, unspecified: Secondary | ICD-10-CM | POA: Diagnosis present

## 2024-04-27 DIAGNOSIS — N390 Urinary tract infection, site not specified: Secondary | ICD-10-CM | POA: Diagnosis present

## 2024-04-27 DIAGNOSIS — S0990XA Unspecified injury of head, initial encounter: Secondary | ICD-10-CM | POA: Diagnosis not present

## 2024-04-27 DIAGNOSIS — I10 Essential (primary) hypertension: Secondary | ICD-10-CM | POA: Diagnosis not present

## 2024-04-27 DIAGNOSIS — R296 Repeated falls: Secondary | ICD-10-CM | POA: Diagnosis present

## 2024-04-27 DIAGNOSIS — Z1623 Resistance to quinolones and fluoroquinolones: Secondary | ICD-10-CM | POA: Diagnosis present

## 2024-04-27 DIAGNOSIS — K4090 Unilateral inguinal hernia, without obstruction or gangrene, not specified as recurrent: Secondary | ICD-10-CM | POA: Diagnosis not present

## 2024-04-27 DIAGNOSIS — N179 Acute kidney failure, unspecified: Secondary | ICD-10-CM | POA: Diagnosis present

## 2024-04-27 DIAGNOSIS — B965 Pseudomonas (aeruginosa) (mallei) (pseudomallei) as the cause of diseases classified elsewhere: Secondary | ICD-10-CM | POA: Diagnosis present

## 2024-04-27 DIAGNOSIS — S5002XA Contusion of left elbow, initial encounter: Secondary | ICD-10-CM | POA: Diagnosis present

## 2024-04-27 DIAGNOSIS — N200 Calculus of kidney: Secondary | ICD-10-CM | POA: Diagnosis present

## 2024-04-27 DIAGNOSIS — D649 Anemia, unspecified: Secondary | ICD-10-CM | POA: Diagnosis not present

## 2024-04-27 DIAGNOSIS — I2510 Atherosclerotic heart disease of native coronary artery without angina pectoris: Secondary | ICD-10-CM | POA: Diagnosis present

## 2024-04-27 DIAGNOSIS — R531 Weakness: Secondary | ICD-10-CM | POA: Diagnosis present

## 2024-04-27 DIAGNOSIS — Y846 Urinary catheterization as the cause of abnormal reaction of the patient, or of later complication, without mention of misadventure at the time of the procedure: Secondary | ICD-10-CM | POA: Diagnosis present

## 2024-04-27 DIAGNOSIS — E1122 Type 2 diabetes mellitus with diabetic chronic kidney disease: Secondary | ICD-10-CM | POA: Diagnosis present

## 2024-04-27 DIAGNOSIS — D631 Anemia in chronic kidney disease: Secondary | ICD-10-CM | POA: Diagnosis present

## 2024-04-27 DIAGNOSIS — Z7982 Long term (current) use of aspirin: Secondary | ICD-10-CM | POA: Diagnosis not present

## 2024-04-27 DIAGNOSIS — Z87891 Personal history of nicotine dependence: Secondary | ICD-10-CM | POA: Diagnosis not present

## 2024-04-27 DIAGNOSIS — Z79899 Other long term (current) drug therapy: Secondary | ICD-10-CM | POA: Diagnosis not present

## 2024-04-27 DIAGNOSIS — T83518A Infection and inflammatory reaction due to other urinary catheter, initial encounter: Secondary | ICD-10-CM | POA: Diagnosis present

## 2024-04-27 DIAGNOSIS — N401 Enlarged prostate with lower urinary tract symptoms: Secondary | ICD-10-CM | POA: Diagnosis present

## 2024-04-27 DIAGNOSIS — E8721 Acute metabolic acidosis: Secondary | ICD-10-CM | POA: Diagnosis present

## 2024-04-27 DIAGNOSIS — I129 Hypertensive chronic kidney disease with stage 1 through stage 4 chronic kidney disease, or unspecified chronic kidney disease: Secondary | ICD-10-CM | POA: Diagnosis present

## 2024-04-27 DIAGNOSIS — W19XXXA Unspecified fall, initial encounter: Secondary | ICD-10-CM | POA: Diagnosis present

## 2024-04-27 DIAGNOSIS — K5730 Diverticulosis of large intestine without perforation or abscess without bleeding: Secondary | ICD-10-CM | POA: Diagnosis not present

## 2024-04-27 LAB — CBC
HCT: 38.2 % — ABNORMAL LOW (ref 39.0–52.0)
Hemoglobin: 12.3 g/dL — ABNORMAL LOW (ref 13.0–17.0)
MCH: 28.8 pg (ref 26.0–34.0)
MCHC: 32.2 g/dL (ref 30.0–36.0)
MCV: 89.5 fL (ref 80.0–100.0)
Platelets: 246 K/uL (ref 150–400)
RBC: 4.27 MIL/uL (ref 4.22–5.81)
RDW: 14.6 % (ref 11.5–15.5)
WBC: 9.9 K/uL (ref 4.0–10.5)
nRBC: 0 % (ref 0.0–0.2)

## 2024-04-27 LAB — CREATININE, URINE, RANDOM: Creatinine, Urine: 71 mg/dL

## 2024-04-27 LAB — COMPREHENSIVE METABOLIC PANEL WITH GFR
ALT: 14 U/L (ref 0–44)
AST: 20 U/L (ref 15–41)
Albumin: 3.8 g/dL (ref 3.5–5.0)
Alkaline Phosphatase: 26 U/L — ABNORMAL LOW (ref 38–126)
Anion gap: 8 (ref 5–15)
BUN: 35 mg/dL — ABNORMAL HIGH (ref 8–23)
CO2: 22 mmol/L (ref 22–32)
Calcium: 9.5 mg/dL (ref 8.9–10.3)
Chloride: 103 mmol/L (ref 98–111)
Creatinine, Ser: 2.14 mg/dL — ABNORMAL HIGH (ref 0.61–1.24)
GFR, Estimated: 30 mL/min — ABNORMAL LOW (ref >60)
Glucose, Bld: 111 mg/dL — ABNORMAL HIGH (ref 70–99)
Potassium: 5.3 mmol/L — ABNORMAL HIGH (ref 3.5–5.1)
Sodium: 133 mmol/L — ABNORMAL LOW (ref 135–145)
Total Bilirubin: 0.7 mg/dL (ref 0.0–1.2)
Total Protein: 7 g/dL (ref 6.5–8.1)

## 2024-04-27 LAB — BASIC METABOLIC PANEL WITH GFR
Anion gap: 5 (ref 5–15)
BUN: 25 mg/dL — ABNORMAL HIGH (ref 8–23)
CO2: 21 mmol/L — ABNORMAL LOW (ref 22–32)
Calcium: 9 mg/dL (ref 8.9–10.3)
Chloride: 108 mmol/L (ref 98–111)
Creatinine, Ser: 1.78 mg/dL — ABNORMAL HIGH (ref 0.61–1.24)
GFR, Estimated: 38 mL/min — ABNORMAL LOW (ref >60)
Glucose, Bld: 126 mg/dL — ABNORMAL HIGH (ref 70–99)
Potassium: 5 mmol/L (ref 3.5–5.1)
Sodium: 134 mmol/L — ABNORMAL LOW (ref 135–145)

## 2024-04-27 LAB — URINALYSIS, ROUTINE W REFLEX MICROSCOPIC
Bilirubin Urine: NEGATIVE
Glucose, UA: NEGATIVE mg/dL
Hgb urine dipstick: NEGATIVE
Ketones, ur: NEGATIVE mg/dL
Nitrite: NEGATIVE
Protein, ur: NEGATIVE mg/dL
Specific Gravity, Urine: 1.011 (ref 1.005–1.030)
pH: 6 (ref 5.0–8.0)

## 2024-04-27 LAB — CBG MONITORING, ED
Glucose-Capillary: 86 mg/dL (ref 70–99)
Glucose-Capillary: 95 mg/dL (ref 70–99)

## 2024-04-27 LAB — GLUCOSE, CAPILLARY
Glucose-Capillary: 99 mg/dL (ref 70–99)
Glucose-Capillary: 117 mg/dL — ABNORMAL HIGH (ref 70–99)

## 2024-04-27 LAB — SODIUM, URINE, RANDOM: Sodium, Ur: 79 mmol/L

## 2024-04-27 MED ORDER — SODIUM ZIRCONIUM CYCLOSILICATE 10 G PO PACK
10.0000 g | PACK | Freq: Once | ORAL | Status: AC
  Administered 2024-04-27: 10 g via ORAL
  Filled 2024-04-27: qty 1

## 2024-04-27 MED ORDER — TAMSULOSIN HCL 0.4 MG PO CAPS
0.4000 mg | ORAL_CAPSULE | Freq: Every day | ORAL | Status: DC
  Administered 2024-04-27 – 2024-05-02 (×6): 0.4 mg via ORAL
  Filled 2024-04-27 (×6): qty 1

## 2024-04-27 MED ORDER — CYCLOBENZAPRINE HCL 5 MG PO TABS: 5.0000 mg | ORAL_TABLET | Freq: Three times a day (TID) | ORAL | Status: DC | PRN

## 2024-04-27 MED ORDER — ONDANSETRON HCL 4 MG PO TABS: 4.0000 mg | ORAL_TABLET | Freq: Four times a day (QID) | ORAL | Status: DC | PRN

## 2024-04-27 MED ORDER — EZETIMIBE 10 MG PO TABS
10.0000 mg | ORAL_TABLET | Freq: Every day | ORAL | Status: DC
  Administered 2024-04-27 – 2024-05-02 (×6): 10 mg via ORAL
  Filled 2024-04-27 (×6): qty 1

## 2024-04-27 MED ORDER — OXYBUTYNIN CHLORIDE ER 10 MG PO TB24
10.0000 mg | ORAL_TABLET | Freq: Every day | ORAL | Status: DC
  Administered 2024-04-27 – 2024-05-02 (×6): 10 mg via ORAL
  Filled 2024-04-27 (×6): qty 1

## 2024-04-27 MED ORDER — FENOFIBRATE 160 MG PO TABS
160.0000 mg | ORAL_TABLET | Freq: Every day | ORAL | Status: DC
  Administered 2024-04-27 – 2024-05-02 (×6): 160 mg via ORAL
  Filled 2024-04-27 (×7): qty 1

## 2024-04-27 MED ORDER — ENOXAPARIN SODIUM 30 MG/0.3ML IJ SOSY
30.0000 mg | PREFILLED_SYRINGE | INTRAMUSCULAR | Status: DC
  Administered 2024-04-27 – 2024-04-29 (×3): 30 mg via SUBCUTANEOUS
  Filled 2024-04-27 (×3): qty 0.3

## 2024-04-27 MED ORDER — ONDANSETRON HCL 4 MG/2ML IJ SOLN: 4.0000 mg | Freq: Four times a day (QID) | INTRAMUSCULAR | Status: DC | PRN

## 2024-04-27 MED ORDER — FINASTERIDE 5 MG PO TABS
5.0000 mg | ORAL_TABLET | Freq: Every day | ORAL | Status: DC
  Administered 2024-04-27 – 2024-05-02 (×6): 5 mg via ORAL
  Filled 2024-04-27 (×6): qty 1

## 2024-04-27 MED ORDER — ACETAMINOPHEN 325 MG PO TABS: 650.0000 mg | ORAL_TABLET | Freq: Four times a day (QID) | ORAL | Status: DC | PRN

## 2024-04-27 MED ORDER — METOPROLOL SUCCINATE ER 50 MG PO TB24
50.0000 mg | ORAL_TABLET | Freq: Every day | ORAL | Status: DC
  Administered 2024-04-27 – 2024-04-30 (×3): 50 mg via ORAL
  Filled 2024-04-27 (×4): qty 1

## 2024-04-27 MED ORDER — ASPIRIN 81 MG PO TBEC
81.0000 mg | DELAYED_RELEASE_TABLET | Freq: Every day | ORAL | Status: DC
Start: 2024-04-27 — End: 2024-05-03
  Administered 2024-04-27 – 2024-05-02 (×6): 81 mg via ORAL
  Filled 2024-04-27 (×6): qty 1

## 2024-04-27 MED ORDER — CITALOPRAM HYDROBROMIDE 20 MG PO TABS
10.0000 mg | ORAL_TABLET | Freq: Every day | ORAL | Status: DC
  Administered 2024-04-27 – 2024-05-02 (×6): 10 mg via ORAL
  Filled 2024-04-27 (×6): qty 1

## 2024-04-27 MED ORDER — FENTANYL CITRATE PF 50 MCG/ML IJ SOSY
50.0000 ug | PREFILLED_SYRINGE | Freq: Once | INTRAMUSCULAR | Status: AC
  Administered 2024-04-27: 50 ug via INTRAVENOUS
  Filled 2024-04-27: qty 1

## 2024-04-27 MED ORDER — TERAZOSIN HCL 2 MG PO CAPS
2.0000 mg | ORAL_CAPSULE | Freq: Every day | ORAL | Status: DC
  Administered 2024-04-27 – 2024-05-02 (×6): 2 mg via ORAL
  Filled 2024-04-27 (×6): qty 1

## 2024-04-27 MED ORDER — SODIUM CHLORIDE 0.9 % IV SOLN
2.0000 g | Freq: Once | INTRAVENOUS | Status: AC
  Administered 2024-04-27: 2 g via INTRAVENOUS
  Filled 2024-04-27: qty 20

## 2024-04-27 MED ORDER — SODIUM CHLORIDE 0.9 % IV BOLUS
1000.0000 mL | Freq: Once | INTRAVENOUS | Status: AC
  Administered 2024-04-27: 1000 mL via INTRAVENOUS

## 2024-04-27 MED ORDER — SODIUM CHLORIDE 0.9 % IV SOLN: INTRAVENOUS | Status: AC

## 2024-04-27 MED ORDER — TRAZODONE HCL 50 MG PO TABS
25.0000 mg | ORAL_TABLET | Freq: Every evening | ORAL | Status: DC | PRN
  Administered 2024-04-27: 50 mg via ORAL
  Administered 2024-04-30: 25 mg via ORAL
  Administered 2024-05-01 – 2024-05-02 (×2): 50 mg via ORAL
  Filled 2024-04-27 (×4): qty 1

## 2024-04-27 MED ORDER — SODIUM CHLORIDE 0.9 % IV SOLN
2.0000 g | INTRAVENOUS | Status: AC
  Administered 2024-04-27 – 2024-05-03 (×7): 2 g via INTRAVENOUS
  Filled 2024-04-27 (×7): qty 12.5

## 2024-04-27 MED ORDER — OXYCODONE HCL 5 MG PO TABS: 2.5000 mg | ORAL_TABLET | ORAL | Status: DC | PRN

## 2024-04-27 NOTE — H&P (Signed)
 History and Physical    Patient: Neil [name] DOB: 05-03-1941 DOA: 04/26/2024 DOS: the patient was seen and examined on 04/27/2024 PCP: Norleen Lynwood ORN, MD  Patient coming from: Home  Chief Complaint:  Chief Complaint  Patient presents with   Fall   HPI: Neil [name] is a 83 y.o. male with medical history significant of allergic rhinitis, BPH, right bundle branch block, right shoulder bursitis, choledocholithiasis, colon polyps, diverticulosis, CAD calcification seen on CT, depression, type 2 diabetes, hyperlipidemia, hypertension, right inguinal hernia, liver abscess, BPH, outlet obstruction, history of suprapubic catheter placement who presented to the emergency department with complaints of having 2 falls yesterday.  He has been having generalized weakness as well.  He was recently put on Bactrim due to UTI.  Last month he had 2 urine cultures growing pansensitive Pseudomonas aeruginosa.  He denied fever, chills, rhinorrhea, sore throat, wheezing or hemoptysis.  No chest pain, palpitations, diaphoresis, PND, orthopnea or pitting edema of the lower extremities.  No abdominal pain, nausea, emesis, diarrhea, constipation, melena or hematochezia.  No flank pain, frequency or hematuria.  No polyuria, polydipsia, polyphagia or blurred vision.   Lab work: Urinalysis shows small leukocyte esterase, 11-20 WBC and rare bacteria microscopic examination.  CBC showed white count 9.9, hemoglobin 12.3 g/dL platelets 753.  CMP showed a sodium 133, potassium 5.3 mmol/L, the rest of the electrolytes and anion gap were normal.  Glucose 111, BUN 35 creatinine 2.14.  His creatinine baseline is 1.40 Mg/dL.  LFTs are normal with exception of an alkaline phosphatase of 26 units/L.  2 different urine cultures from last month show pansensitive Pseudomonas aeruginosa.  Imaging: CT head without contrast showed no evidence of acute intracranial normality.  CT abdomen/pelvis without contrast showed a  punctate nonobstructive right renal stone.  Diverticulosis without diverticulitis.  Aortic atherosclerosis.  Enlarged prostate.   ED course: Initial vital signs were temperature 98.1 F, pulse 54, respirations 20, BP 193/72 mmHg O2 sat 99% on room air.  Patient received 1000 mL of normal saline bolus, fentanyl  50 mcg IVP, ceftriaxone  2 g IVPB and Lokelma  10 g p.o. x 1.  Review of Systems: As mentioned in the history of present illness. All other systems reviewed and are negative. Past Medical History:  Diagnosis Date   ALLERGIC RHINITIS 12/04/2007   Anxiety    BENIGN PROSTATIC HYPERTROPHY 06/05/2007   BUNDLE BRANCH BLOCK, RIGHT 07/21/2009   BURSITIS, RIGHT SHOULDER 11/06/2010   Choledocholithiasis    CKD (chronic kidney disease) stage 3, GFR 30-59 ml/min (HCC) 08/02/2017   COLONIC POLYPS, HX OF 06/05/2007   tubular adenoma also 02/19/2013   Coronary artery calcification seen on CT scan 03/07/2016   Depression    DIABETES MELLITUS, TYPE [name] 12/04/2007   DIVERTICULOSIS, COLON 07/21/2009   ERECTILE DYSFUNCTION 06/05/2007   Gallstones    HYPERLIPIDEMIA 06/05/2007   on meds   HYPERTENSION 06/05/2007   on meds   Hypotension    INGUINAL HERNIA, RIGHT, SMALL 11/06/2010   Liver abscess 05/23/2015   SKIN LESION 06/03/2008   Past Surgical History:  Procedure Laterality Date   BIOPSY  09/08/2021   Procedure: BIOPSY;  Surgeon: Eda Iha, MD;  Location: [facility];  Service: Endoscopy;;   CHOLECYSTECTOMY N/A 03/02/2015   Procedure: LAPAROSCOPIC CHOLECYSTECTOMY;  Surgeon: Elon Pacini, MD;  Location: [facility];  Service: General;  Laterality: N/A;   COLONOSCOPY  2017   JP-MAC-suprep (good)-TA   CYSTOSCOPY N/A 05/29/2022   Procedure: PHYLLIS;  Surgeon: Rosalind Bare  B, MD;  Location: [facility];  Service: Urology;  Laterality: N/A;   ERCP N/A 03/01/2015   Procedure: ENDOSCOPIC RETROGRADE CHOLANGIOPANCREATOGRAPHY (ERCP);  Surgeon: Norleen LOISE Kiang, MD;  Location: [facility];  Service:  Endoscopy;  Laterality: N/A;  Dr Gail wants patient to stay overnight after ERCP   ERCP N/A 05/03/2015   Procedure: ENDOSCOPIC RETROGRADE CHOLANGIOPANCREATOGRAPHY (ERCP);  Surgeon: Norleen LOISE Kiang, MD;  Location: [facility];  Service: Endoscopy;  Laterality: N/A;   FINGER SURGERY Left    little finger   FLEXIBLE SIGMOIDOSCOPY N/A 09/08/2021   Procedure: FLEXIBLE SIGMOIDOSCOPY;  Surgeon: Eda Iha, MD;  [facility];  Service: Endoscopy;  Laterality: N/A;   HEMORRHOID SURGERY  1973   INGUINAL HERNIA REPAIR Right 2014   8'14 repair   INSERTION OF SUPRAPUBIC CATHETER N/A 05/29/2022   Procedure: INSERTION OF SUPRAPUBIC CATHETER;  Surgeon: Rosalind Zachary NOVAK, MD;  [facility];  Service: Urology;  Laterality: N/A;  30 MINS   POLYPECTOMY     TONSILLECTOMY     WISDOM TOOTH EXTRACTION  1972   Social History:  reports that he quit smoking about 17 years ago. His smoking use included cigarettes. He has never used smokeless tobacco. He reports that he does not currently use alcohol. He reports that he does not use drugs.  Allergies  Allergen Reactions   Statins Other (See Comments)    Joint pain   Zanaflex  [Tizanidine ] Other (See Comments)    Dizzy, sleepy    Family History  Problem Relation Age of Onset   Colon polyps Mother 85   Colon cancer Mother 60   Brain cancer Mother 59       mets from colon cancer   Colon cancer Paternal Uncle    Colon polyps Paternal Uncle    Rectal cancer Neg Hx    Stomach cancer Neg Hx    Esophageal cancer Neg Hx     Prior to Admission medications   Medication Sig Start Date End Date Taking? Authorizing Provider  Alpha-D-Galactosidase (BEANO PO) Take 1 tablet by mouth at bedtime.   Yes [provider]  aspirin  81 MG EC tablet Take 81 mg by mouth daily. 10/15/08  Yes [provider]  citalopram  (CELEXA ) 10 MG tablet TAKE 1 TABLET BY MOUTH DAILY 01/21/23  Yes Norleen Lynwood ORN, MD  cyclobenzaprine  (FLEXERIL ) 5 MG tablet TAKE  1 TABLET BY MOUTH 3 TIMES  DAILY AS NEEDED FOR MUSCLE  SPASM(S) 01/21/23  Yes Norleen Lynwood ORN, MD  docusate sodium  (COLACE) 100 MG capsule Take 100 mg by mouth daily as needed for mild constipation or moderate constipation.   Yes [provider]  ezetimibe  (ZETIA ) 10 MG tablet TAKE 1 TABLET BY MOUTH DAILY 02/06/24  Yes Norleen Lynwood ORN, MD  fenofibrate  160 MG tablet TAKE 1 TABLET BY MOUTH AT  BEDTIME 03/10/24  Yes Norleen Lynwood ORN, MD  finasteride  (PROSCAR ) 5 MG tablet TAKE 1 TABLET BY MOUTH DAILY 10/10/23  Yes Norleen Lynwood ORN, MD  ibuprofen (ADVIL) 200 MG tablet Take 600 mg by mouth 2 (two) times daily as needed for moderate pain.   Yes [provider]  methenamine  (HIPREX ) 1 g tablet Take 1 tablet (1 g total) by mouth 2 (two) times daily. 01/07/24  Yes Norleen Lynwood ORN, MD  metoprolol  succinate (TOPROL -XL) 50 MG 24 hr tablet TAKE 1 TABLET BY MOUTH DAILY  WITH OR IMMEDIATELY FOLLOWING A  MEAL 11/01/23  Yes Norleen Lynwood ORN, MD  Multiple Vitamin (MULTIVITAMIN WITH MINERALS)  TABS tablet Take 1 tablet by mouth daily. 09/12/21  Yes Rizwan, Saima, MD  oxybutynin  (DITROPAN -XL) 10 MG 24 hr tablet Take 10 mg by mouth daily. 11/21/23  Yes [provider]  silodosin  (RAPAFLO ) 4 MG CAPS capsule Take 1 capsule (4 mg total) by mouth daily with breakfast. 01/07/24  Yes Norleen Lynwood ORN, MD  sulfamethoxazole-trimethoprim (BACTRIM DS) 800-160 MG tablet Take 1 tablet by mouth 2 (two) times daily. 04/20/24  Yes [provider]  terazosin  (HYTRIN ) 2 MG capsule Take 1 capsule (2 mg total) by mouth at bedtime. 01/07/24  Yes Norleen Lynwood ORN, MD  traZODone  (DESYREL ) 50 MG tablet TAKE 1/2 TO 1 TABLET BY MOUTH AT BEDTIME AS NEEDED FOR SLEEP 08/19/23  Yes Norleen Lynwood ORN, MD  acetaminophen  (TYLENOL ) 500 MG tablet Take 1,500 mg by mouth daily as needed for mild pain. Patient not taking: Reported on 08/30/2023    [provider]  Cholecalciferol (VITAMIN D ) 50 MCG (2000 UT) CAPS Take 2,000 Units by mouth at bedtime.     [provider]  MYRBETRIQ 25 MG TB24 tablet Take 25 mg by mouth daily. Patient not taking: Reported on 01/14/2024 08/27/23   [provider]  sodium chloride  (OCEAN) 0.65 % nasal spray Place 1 spray into the nose daily as needed for congestion.     [provider]    Physical Exam: Vitals:   04/27/24 0332 04/27/24 0340 04/27/24 0401 04/27/24 0736  BP: (!) 158/85  (!) 175/66 (!) 164/74  Pulse: (!) 44 (!) 52 (!) 53 (!) 51  Resp: 16 20 19 14   Temp:  97.6 F (36.4 C)  (!) 97.5 F (36.4 C)  TempSrc:  Oral  Oral  SpO2: 99% 96% 98% 97%  Weight:       Physical Exam Vitals and nursing note reviewed.  Constitutional:      General: He is awake. He is not in acute distress.    Appearance: He is ill-appearing.  HENT:     Head: Normocephalic.     Nose: No rhinorrhea.     Mouth/Throat:     Mouth: Mucous membranes are dry.  Eyes:     General: No scleral icterus.    Pupils: Pupils are equal, round, and reactive to light.  Neck:     Vascular: No JVD.  Cardiovascular:     Rate and Rhythm: Normal rate and regular rhythm.     Heart sounds: S1 normal and S2 normal.  Pulmonary:     Effort: Pulmonary effort is normal.     Breath sounds: Normal breath sounds. No wheezing, rhonchi or rales.  Abdominal:     General: Bowel sounds are normal. There is no distension.     Palpations: Abdomen is soft.     Tenderness: There is no abdominal tenderness.  Musculoskeletal:     Cervical back: Neck supple.     Right lower leg: No edema.     Left lower leg: No edema.  Skin:    General: Skin is warm and dry.  Neurological:     General: No focal deficit present.     Mental Status: He is alert and oriented to person, place, and time.  Psychiatric:        Mood and Affect: Mood normal.        Behavior: Behavior is cooperative.     Data Reviewed:  Results are pending, will review when available.  EKG: Vent. rate 57 BPM PR interval 196 ms QRS duration 156 ms QT/QTcB  450/439 ms P-R-T axes 69 61 37 Sinus rhythm Right bundle branch block Baseline wander in lead(s) V5  Assessment and Plan: Principal Problem:   Acute renal failure superimposed  on stage 3a chronic kidney disease (HCC) Observation/telemetry. Discontinue Bactrim. Continue IV fluids. Avoid hypotension. Avoid nephrotoxins. Monitor intake and output. Monitor renal function electrolytes.  Active Problems:   Hyperkalemia Received Lokelma  earlier. Continue IV fluids. Follow-up BMP later this morning.    Hyponatremia Urine sodium was normal. Continue normal saline infusion. Follow-up sodium level.    Acute UTI (urinary tract infection)  .  History of suprapubic catheter.   Most recent urine cultures grew pansensitive Pseudomonas. He received ceftriaxone  in the emergency department. Will switch to cefepime  2 g IVPB every 24 hours.    Hyperlipidemia Continue fenofibrate  160 mg p.o. daily. Continue ezetimibe  10 mg p.o. daily.    BPH (benign prostatic hyperplasia) Continue finasteride  5 mg p.o. daily. Continue tamsulosin  0.4 mg p.o. daily. Continue terazosin  2 mg p.o. bedtime.    Normocytic anemia Monitor hemoglobin hemoglobin.    Essential hypertension, benign Continue metoprolol  succinate 50 mg p.o. daily. On terazosin  2 mg p.o. bedtime. Monitor blood pressure and heart rate.    Diabetes mellitus type 2 in nonobese (HCC) Carbohydrate modified diet. CBG monitoring with RI SS. Hold metformin  due to AKI. Check hemoglobin A1c.    Advance Care Planning:   Code Status: Full Code   Consults:   Family Communication:   Severity of Illness: The appropriate patient status for this patient is INPATIENT. Inpatient status is judged to be reasonable and necessary in order to provide the required intensity of service to ensure the patient's safety. The patient's presenting symptoms, physical exam findings, and initial radiographic and laboratory data in the context of their  chronic comorbidities is felt to place them at high risk for further clinical deterioration. Furthermore, it is not anticipated that the patient will be medically stable for discharge from the hospital within 2 midnights of admission.   * I certify that at the point of admission it is my clinical judgment that the patient will require inpatient hospital care spanning beyond 2 midnights from the point of admission due to high intensity of service, high risk for further deterioration and high frequency of surveillance required.*  Author: Alm Dorn Castor, MD 04/27/2024 7:56 AM  For on call review [email or web address].   This document was prepared using Dragon voice recognition software and may contain some unintended transcription errors.

## 2024-04-27 NOTE — ED Provider Notes (Addendum)
 [facility] Emergency Department Provider Note MRN:  [id number]  Arrival date & time: 04/27/24     Chief Complaint   Fall   History of Present Illness   Neil [name] is a 83 y.o. year-old male with a history of diabetes, CKD presenting to the ED with chief complaint of fall.  Multiple falls today.  Attributes it to poor balance today.  Thinks that has something to do with the new medication he is on for UTI.  Thinks it is Bactrim.  Had a fall at 1 PM today and then another fall this evening when getting ready for bed.  Hit his head both times but no loss of consciousness.  No neck or back pain, no chest pain or shortness of breath, no abdominal pain.  Small bruise to the left elbow, no other complaints.  Review of Systems  A thorough review of systems was obtained and all systems are negative except as noted in the HPI and PMH.   Patient's Health History    Past Medical History:  Diagnosis Date   ALLERGIC RHINITIS 12/04/2007   Anxiety    BENIGN PROSTATIC HYPERTROPHY 06/05/2007   BUNDLE BRANCH BLOCK, RIGHT 07/21/2009   BURSITIS, RIGHT SHOULDER 11/06/2010   Choledocholithiasis    CKD (chronic kidney disease) stage 3, GFR 30-59 ml/min (HCC) 08/02/2017   COLONIC POLYPS, HX OF 06/05/2007   tubular adenoma also 02/19/2013   Coronary artery calcification seen on CT scan 03/07/2016   Depression    DIABETES MELLITUS, TYPE [name] 12/04/2007   DIVERTICULOSIS, COLON 07/21/2009   ERECTILE DYSFUNCTION 06/05/2007   Gallstones    HYPERLIPIDEMIA 06/05/2007   on meds   HYPERTENSION 06/05/2007   on meds   Hypotension    INGUINAL HERNIA, RIGHT, SMALL 11/06/2010   Liver abscess 05/23/2015   SKIN LESION 06/03/2008    Past Surgical History:  Procedure Laterality Date   BIOPSY  09/08/2021   Procedure: BIOPSY;  Surgeon: Eda Iha, MD;  Location: [facility];  Service: Endoscopy;;   CHOLECYSTECTOMY N/A 03/02/2015   Procedure: LAPAROSCOPIC CHOLECYSTECTOMY;   Surgeon: Elon Pacini, MD;  [facility];  Service: General;  Laterality: N/A;   COLONOSCOPY  2017   JP-MAC-suprep (good)-TA   CYSTOSCOPY N/A 05/29/2022   Procedure: PHYLLIS;  Surgeon: Rosalind Zachary NOVAK, MD;  [facility];  Service: Urology;  Laterality: N/A;   ERCP N/A 03/01/2015   Procedure: ENDOSCOPIC RETROGRADE CHOLANGIOPANCREATOGRAPHY (ERCP);  Surgeon: Norleen LOISE Kiang, MD;  Location: [facility];  Service: Endoscopy;  Laterality: N/A;  Dr Pacini wants patient to stay overnight after ERCP   ERCP N/A 05/03/2015   Procedure: ENDOSCOPIC RETROGRADE CHOLANGIOPANCREATOGRAPHY (ERCP);  Surgeon: Norleen LOISE Kiang, MD;  Location: [facility];  Service: Endoscopy;  Laterality: N/A;   FINGER SURGERY Left    little finger   FLEXIBLE SIGMOIDOSCOPY N/A 09/08/2021   Procedure: FLEXIBLE SIGMOIDOSCOPY;  Surgeon: Eda Iha, MD;  [facility];  Service: Endoscopy;  Laterality: N/A;   HEMORRHOID SURGERY  1973   INGUINAL HERNIA REPAIR Right 2014   8'14 repair   INSERTION OF SUPRAPUBIC CATHETER N/A 05/29/2022   Procedure: INSERTION OF SUPRAPUBIC CATHETER;  Surgeon: Rosalind Zachary NOVAK, MD;  [facility];  Service: Urology;  Laterality: N/A;  30 MINS   POLYPECTOMY     TONSILLECTOMY     WISDOM TOOTH EXTRACTION  1972    Family History  Problem Relation Age of Onset   Colon polyps Mother 78   Colon cancer Mother 20  Brain cancer Mother 77       mets from colon cancer   Colon cancer Paternal Uncle    Colon polyps Paternal Uncle    Rectal cancer Neg Hx    Stomach cancer Neg Hx    Esophageal cancer Neg Hx     Social History   Socioeconomic History   Marital status: Widowed    Spouse name: Not on file   Number of children: Not on file   Years of education: Not on file   Highest education level: Associate degree: occupational, Scientist, product/process development, or vocational program  Occupational History   Occupation: retired  Tobacco Use   Smoking status: Former    Current packs/day: 0.00     Types: Cigarettes    Quit date: 2008    Years since quitting: 17.5   Smokeless tobacco: Never  Vaping Use   Vaping status: Never Used  Substance and Sexual Activity   Alcohol use: Not Currently    Comment: occ beer but not in years   Drug use: No   Sexual activity: Not Currently  Other Topics Concern   Not on file  Social History Narrative   Not on file   Social Drivers of Health   Financial Resource Strain: Low Risk  (01/10/2024)   Overall Financial Resource Strain (CARDIA)    Difficulty of Paying Living Expenses: Not hard at all  Food Insecurity: No Food Insecurity (01/10/2024)   Hunger Vital Sign    Worried About Running Out of Food in the Last Year: Never true    Ran Out of Food in the Last Year: Never true  Transportation Needs: No Transportation Needs (01/10/2024)   PRAPARE - Administrator, Civil Service (Medical): No    Lack of Transportation (Non-Medical): No  Physical Activity: Insufficiently Active (01/10/2024)   Exercise Vital Sign    Days of Exercise per Week: 1 day    Minutes of Exercise per Session: 10 min  Stress: No Stress Concern Present (01/10/2024)   [employer] of Occupational Health - Occupational Stress Questionnaire    Feeling of Stress : Not at all  Social Connections: Moderately Isolated (01/10/2024)   Social Connection and Isolation Panel    Frequency of Communication with Friends and Family: Once a week    Frequency of Social Gatherings with Friends and Family: Once a week    Attends Religious Services: More than 4 times per year    Active Member of [employer] or Organizations: Yes    Attends Banker Meetings: More than 4 times per year    Marital Status: Widowed  Intimate Partner Violence: Not At Risk (08/30/2023)   Humiliation, Afraid, Rape, and Kick questionnaire    Fear of Current or Ex-Partner: No    Emotionally Abused: No    Physically Abused: No    Sexually Abused: No     Physical Exam   Vitals:   04/27/24  0340 04/27/24 0401  BP:  (!) 175/66  Pulse: (!) 52 (!) 53  Resp: 20 19  Temp: 97.6 F (36.4 C)   SpO2: 96% 98%    CONSTITUTIONAL: Well-appearing, NAD NEURO/PSYCH:  Alert and oriented x 3, normal and symmetric strength and sensation, normal coordination, normal speech EYES:  eyes equal and reactive ENT/NECK:  no LAD, no JVD CARDIO: Bradycardic rate, well-perfused, normal S1 and S2 PULM:  CTAB no wheezing or rhonchi GI/GU:  non-distended, non-tender MSK/SPINE:  No gross deformities, no edema SKIN:  no rash, atraumatic   *Additional  and/or pertinent findings included in MDM below  Diagnostic and Interventional Summary    EKG Interpretation Date/Time:  Sunday April 26 2024 23:53:48 EDT Ventricular Rate:  57 PR Interval:  196 QRS Duration:  156 QT Interval:  450 QTC Calculation: 439 R Axis:   61  Text Interpretation: Sinus rhythm Right bundle branch block Baseline wander in lead(s) V5 Confirmed by Theadore Sharper [phone]) on 04/27/2024 3:57:21 AM       Labs Reviewed  CBC - Abnormal; Notable for the following components:      Result Value   Hemoglobin 12.3 (*)    HCT 38.2 (*)    All other components within normal limits  COMPREHENSIVE METABOLIC PANEL WITH GFR - Abnormal; Notable for the following components:   Sodium 133 (*)    Potassium 5.3 (*)    Glucose, Bld 111 (*)    BUN 35 (*)    Creatinine, Ser 2.14 (*)    Alkaline Phosphatase 26 (*)    GFR, Estimated 30 (*)    All other components within normal limits  URINALYSIS, ROUTINE W REFLEX MICROSCOPIC - Abnormal; Notable for the following components:   Leukocytes,Ua SMALL (*)    Bacteria, UA RARE (*)    All other components within normal limits    CT ABDOMEN PELVIS WO CONTRAST  Final Result    CT HEAD WO CONTRAST ( )  Final Result      Medications  sodium zirconium cyclosilicate  (LOKELMA ) packet 10 g (has no administration in time range)  sodium chloride  0.9 % bolus 1,000 mL (1,000 mLs Intravenous New Bag/Given  04/27/24 0330)  cefTRIAXone  (ROCEPHIN ) 2 g in sodium chloride  0.9 % 100 mL IVPB (2 g Intravenous New Bag/Given 04/27/24 0330)  fentaNYL  (SUBLIMAZE ) injection 50 mcg (50 mcg Intravenous Given 04/27/24 9667)     Procedures  /  Critical Care Procedures  ED Course and Medical Decision Making  Initial Impression and Ddx Differential diagnosis includes electrolyte disturbance, dehydration, medication side effect, anemia, denies any symptoms to suggest syncope, with advanced age and head trauma will obtain CT head to exclude intracranial bleeding.  Past medical/surgical history that increases complexity of ED encounter: Diabetes, CKD  Interpretation of Diagnostics I personally reviewed the laboratory assessment and my interpretation is as follows: Acute kidney injury with hyperkalemia.  CT scan without acute pathology.    Patient Reassessment and Ultimate Disposition/Management     Well-appearing on reassessment, appropriate for discharge.  Patient management required discussion with the following services or consulting groups:  Hospitalist Service  Complexity of Problems Addressed Acute illness or injury that poses threat of life of bodily function  Additional Data Reviewed and Analyzed Further history obtained from: Prior labs/imaging results  Additional Factors Impacting ED Encounter Risk Consideration of hospitalization  Sharper HERO. Theadore, MD [facility] Emergency Medicine [facility] [email or web address]  Final Clinical Impressions(s) / ED Diagnoses     ICD-10-CM   1. AKI (acute kidney injury) (HCC)  N17.9     2. Weakness  R53.1       ED Discharge Orders     None        Discharge Instructions Discussed with and Provided to Patient:   Discharge Instructions   None       Theadore Sharper HERO, MD 04/27/24 [id number]    Theadore Sharper HERO, MD 04/27/24 [phone]

## 2024-04-27 NOTE — Discharge Instructions (Addendum)
 Neil [name],  You were in the hospital with a urinary tract infection requiring IV antibiotics. Thankfully, this has been treated with the antibiotic. Please follow-up with your primary care physician.

## 2024-04-27 NOTE — Plan of Care (Signed)
  Problem: Coping: Goal: Ability to adjust to condition or change in health will improve Outcome: Progressing   Problem: Nutritional: Goal: Maintenance of adequate nutrition will improve Outcome: Progressing   Problem: Coping: Goal: Level of anxiety will decrease Outcome: Progressing   Problem: Pain Managment: Goal: General experience of comfort will improve and/or be controlled Outcome: Progressing   Problem: Safety: Goal: Ability to remain free from injury will improve Outcome: Progressing

## 2024-04-27 NOTE — ED Notes (Signed)
 Patient transported to CT

## 2024-04-28 DIAGNOSIS — N179 Acute kidney failure, unspecified: Secondary | ICD-10-CM | POA: Diagnosis not present

## 2024-04-28 DIAGNOSIS — N1831 Chronic kidney disease, stage 3a: Secondary | ICD-10-CM | POA: Diagnosis not present

## 2024-04-28 LAB — COMPREHENSIVE METABOLIC PANEL WITH GFR
ALT: 13 U/L (ref 0–44)
AST: 17 U/L (ref 15–41)
Albumin: 3.3 g/dL — ABNORMAL LOW (ref 3.5–5.0)
Alkaline Phosphatase: 20 U/L — ABNORMAL LOW (ref 38–126)
Anion gap: 4 — ABNORMAL LOW (ref 5–15)
BUN: 22 mg/dL (ref 8–23)
CO2: 19 mmol/L — ABNORMAL LOW (ref 22–32)
Calcium: 8.7 mg/dL — ABNORMAL LOW (ref 8.9–10.3)
Chloride: 113 mmol/L — ABNORMAL HIGH (ref 98–111)
Creatinine, Ser: 1.58 mg/dL — ABNORMAL HIGH (ref 0.61–1.24)
GFR, Estimated: 44 mL/min — ABNORMAL LOW (ref >60)
Glucose, Bld: 90 mg/dL (ref 70–99)
Potassium: 4.6 mmol/L (ref 3.5–5.1)
Sodium: 136 mmol/L (ref 135–145)
Total Bilirubin: 0.5 mg/dL (ref 0.0–1.2)
Total Protein: 6.1 g/dL — ABNORMAL LOW (ref 6.5–8.1)

## 2024-04-28 LAB — CBC
HCT: 35.2 % — ABNORMAL LOW (ref 39.0–52.0)
Hemoglobin: 11 g/dL — ABNORMAL LOW (ref 13.0–17.0)
MCH: 28.6 pg (ref 26.0–34.0)
MCHC: 31.3 g/dL (ref 30.0–36.0)
MCV: 91.4 fL (ref 80.0–100.0)
Platelets: 200 K/uL (ref 150–400)
RBC: 3.85 MIL/uL — ABNORMAL LOW (ref 4.22–5.81)
RDW: 14.7 % (ref 11.5–15.5)
WBC: 6.2 K/uL (ref 4.0–10.5)
nRBC: 0 % (ref 0.0–0.2)

## 2024-04-28 LAB — GLUCOSE, CAPILLARY
Glucose-Capillary: 93 mg/dL (ref 70–99)
Glucose-Capillary: 121 mg/dL — ABNORMAL HIGH (ref 70–99)
Glucose-Capillary: 95 mg/dL (ref 70–99)
Glucose-Capillary: 125 mg/dL — ABNORMAL HIGH (ref 70–99)

## 2024-04-28 MED ORDER — CHLORHEXIDINE GLUCONATE CLOTH 2 % EX PADS
6.0000 | MEDICATED_PAD | Freq: Every day | CUTANEOUS | Status: DC
  Administered 2024-04-28 – 2024-05-02 (×5): 6 via TOPICAL

## 2024-04-28 MED ORDER — SODIUM CHLORIDE 0.9 % IV SOLN: INTRAVENOUS | Status: AC

## 2024-04-28 NOTE — Progress Notes (Addendum)
 PROGRESS NOTE    Neil [name]  FMW:[id number] DOB: 18-Feb-1941 DOA: 04/26/2024 PCP: Norleen Lynwood ORN, MD   Brief Narrative:  83 year old male with history of hypertension, hyperlipidemia, BPH, CAD, diabetes mellitus type 2, depression, liver abscess, bladder outlet obstruction with history of suprapubic catheter placement who was recently put on Bactrim for UTI as an outpatient presented with 2 falls at home.  On presentation, UA was suggestive of UTI; WBC of 9.9; potassium of 5.3; creatinine of 2.14 with baseline creatinine of 1.4.  He was started on IV fluids and course of antibiotics.  CT of the head showed no acute intracranial abnormality.  CT abdomen/pelvis without contrast showed a punctate nonobstructing right renal stone; diverticulosis without diverticulitis and enlarged prostate.  Assessment & Plan:   AKI on CKD stage IIIa Acute metabolic acidosis - Baseline creatinine of around 1.4.  Presented with creatinine of 2.14.  Treated with IV fluids.  Creatinine improving to 1.58 today.  Monitor.  Continue gentle hydration - Bactrim discontinued.  UTI: Present on admission possibly associated with suprapubic catheter -Was on Bactrim as an outpatient recently which was discontinued on presentation - Continue cefepime .  Follow cultures  Hyponatremia - Resolved  Hyperkalemia - Received Lokelma  on presentation.  Resolved  Anemia of chronic disease - From chronic kidney disease.  Hemoglobin stable.  Monitor intermittently  Essential hypertension Hyperlipidemia - Continue metoprolol  succinate, ezetimibe  and fenofibrate   BPH Bladder outlet obstruction - Continue tamsulosin , terazosin  and finasteride .  Continue suprapubic catheter.  Outpatient follow-up with urology  Diabetes mellitus type 2 - Metformin  on hold.  Blood sugars stable.  Carb modified diet.  Outpatient follow-up  Depression -Continue citalopram   Physical deconditioning Falls at home - PT eval  DVT  prophylaxis: Lovenox  Code Status: Full Family Communication: Stepdaughter/Nicole on phone  disposition Plan: Status is: Inpatient Remains inpatient appropriate because: Of severity of illness    Consultants: None  Procedures: None  Antimicrobials: Cefepime  from 04/27/2024 onwards   Subjective: Patient seen and examined at bedside.  Feels little better.  Still feels weak.  No fever or vomiting reported.  Objective: Vitals:   04/27/24 1703 04/27/24 2024 04/28/24 0100 04/28/24 0532  BP: (!) 158/56 (!) 166/61 138/64 (!) 147/69  Pulse: (!) 48 (!) 47 (!) 48 (!) 52  Resp: 20 18 19 20   Temp: 98.3 F (36.8 C) 98.5 F (36.9 C) 98.8 F (37.1 C) 98.6 F (37 C)  TempSrc: Oral     SpO2: 98% 100%  98%  Weight:        Intake/Output Summary (Last 24 hours) at 04/28/2024 0819 Last data filed at 04/28/2024 0600 Gross per 24 hour  Intake 206.86 ml  Output 400 ml  Net -193.14 ml   Filed Weights   04/26/24 2349  Weight: 72.6 kg    Examination:  General exam: Appears calm and comfortable.  Elderly male lying in bed. Respiratory system: Bilateral decreased breath sounds at bases Cardiovascular system: S1 & S2 heard, intermittent bradycardia present Gastrointestinal system: Abdomen is nondistended, soft and nontender. Normal bowel sounds heard. Extremities: No cyanosis, clubbing, edema  Genitourinary: Suprapubic catheter present Central nervous system: Alert and oriented. No focal neurological deficits. Moving extremities Skin: No rashes, lesions or ulcers Psychiatry: Mostly flat affect.  Not agitated    Data Reviewed: I have personally reviewed following labs and imaging studies  CBC: Recent Labs  Lab 04/27/24 0101 04/28/24 0533  WBC 9.9 6.2  HGB 12.3* 11.0*  HCT 38.2* 35.2*  MCV 89.5 91.4  PLT 246 200   Basic Metabolic Panel: Recent Labs  Lab 04/27/24 0101 04/27/24 1747 04/28/24 0533  NA 133* 134* 136  K 5.3* 5.0 4.6  CL 103 108 113*  CO2 22 21* 19*  GLUCOSE  111* 126* 90  BUN 35* 25* 22  CREATININE 2.14* 1.78* 1.58*  CALCIUM 9.5 9.0 8.7*   GFR: Estimated Creatinine Clearance: 35.5 mL/min (A) (by C-G formula based on SCr of 1.58 mg/dL (H)). Liver Function Tests: Recent Labs  Lab 04/27/24 0101 04/28/24 0533  AST 20 17  ALT 14 13  ALKPHOS 26* 20*  BILITOT 0.7 0.5  PROT 7.0 6.1*  ALBUMIN 3.8 3.3*   No results for input(s): LIPASE, AMYLASE in the last 168 hours. No results for input(s): AMMONIA in the last 168 hours. Coagulation Profile: No results for input(s): INR, PROTIME in the last 168 hours. Cardiac Enzymes: No results for input(s): CKTOTAL, CKMB, CKMBINDEX, TROPONINI in the last 168 hours. BNP (last 3 results) No results for input(s): PROBNP in the last 8760 hours. HbA1C: No results for input(s): HGBA1C in the last 72 hours. CBG: Recent Labs  Lab 04/27/24 0808 04/27/24 1203 04/27/24 1831 04/27/24 2025 04/28/24 0736  GLUCAP 86 95 99 117* 93   Lipid Profile: No results for input(s): CHOL, HDL, LDLCALC, TRIG, CHOLHDL, LDLDIRECT in the last 72 hours. Thyroid  Function Tests: No results for input(s): TSH, T4TOTAL, FREET4, T3FREE, THYROIDAB in the last 72 hours. Anemia Panel: No results for input(s): VITAMINB12, FOLATE, FERRITIN, TIBC, IRON, RETICCTPCT in the last 72 hours. Sepsis Labs: No results for input(s): PROCALCITON, LATICACIDVEN in the last 168 hours.  No results found for this or any previous visit (from the past 240 hours).       Radiology Studies: CT ABDOMEN PELVIS WO CONTRAST Result Date: 04/27/2024 CLINICAL DATA:  Flank pain EXAM: CT ABDOMEN AND PELVIS WITHOUT CONTRAST TECHNIQUE: Multidetector CT imaging of the abdomen and pelvis was performed following the standard protocol without IV contrast. RADIATION DOSE REDUCTION: This exam was performed according to the departmental dose-optimization program which includes automated exposure control,  adjustment of the mA and/or kV according to patient size and/or use of iterative reconstruction technique. COMPARISON:  09/28/2021 FINDINGS: Lower chest: No acute abnormality. Hepatobiliary: Gallbladder has been surgically removed. Pneumobilia is seen. Pancreas: Unremarkable. No pancreatic ductal dilatation or surrounding inflammatory changes. Spleen: Normal in size without focal abnormality. Adrenals/Urinary Tract: Adrenal glands are within normal limits. Kidneys are well visualized bilaterally. Punctate nonobstructing right renal stone is seen. No obstructive changes are noted. The bladder is decompressed by suprapubic catheter. Stomach/Bowel: Scattered diverticular change of the colon is noted. No diverticulitis is seen. The appendix is within normal limits. Small bowel and stomach are unremarkable. Vascular/Lymphatic: Aortic atherosclerosis. No enlarged abdominal or pelvic lymph nodes. Reproductive: Prostate is enlarged in size. Other: Fat containing left inguinal hernia is noted. Musculoskeletal: Degenerative changes of lumbar spine are seen. IMPRESSION: Punctate nonobstructing right renal stone. Diverticulosis without diverticulitis. Electronically Signed   By: Oneil Devonshire M.D.   On: 04/27/2024 03:36   CT HEAD WO CONTRAST ( ) Result Date: 04/27/2024 CLINICAL DATA:  Head trauma, moderate-severe EXAM: CT HEAD WITHOUT CONTRAST TECHNIQUE: Contiguous axial images were obtained from the base of the skull through the vertex without intravenous contrast. RADIATION DOSE REDUCTION: This exam was performed according to the departmental dose-optimization program which includes automated exposure control, adjustment of the mA and/or kV according to patient size and/or use of iterative reconstruction technique. COMPARISON:  None Available. FINDINGS: Brain: No  evidence of acute infarction, hemorrhage, hydrocephalus, extra-axial collection or mass lesion/mass effect. Patchy white matter hypodensities, compatible with  chronic microvascular ischemic change. Vascular: No hyperdense vessel or unexpected calcification. Skull: No acute fracture. Sinuses/Orbits: Clear sinuses.  No acute orbital findings. Other: No mastoid effusions. IMPRESSION: No evidence of acute intracranial abnormality. Electronically Signed   By: Gilmore GORMAN Molt M.D.   On: 04/27/2024 01:28        Scheduled Meds:  aspirin  EC  81 mg Oral Daily   citalopram   10 mg Oral Daily   enoxaparin  (LOVENOX ) injection  30 mg Subcutaneous Q24H   ezetimibe   10 mg Oral Daily   fenofibrate   160 mg Oral QHS   finasteride   5 mg Oral Daily   metoprolol  succinate  50 mg Oral Daily   oxybutynin   10 mg Oral Daily   tamsulosin   0.4 mg Oral QPC breakfast   terazosin   2 mg Oral QHS   Continuous Infusions:  ceFEPime  (MAXIPIME ) IV 2 g (04/27/24 0904)          Sophie Mao, MD [facility] 04/28/2024, 8:19 AM

## 2024-04-28 NOTE — Evaluation (Signed)
 Physical Therapy Evaluation Patient Details Name: Neil [name] MRN: [id number] DOB: [birth year] Today's Date: 04/28/2024  History of Present Illness  83 y.o. male who presented to the emergency department with complaints of having 2 falls.  He has been having generalized weakness as well.  He was recently put on Bactrim due to UTI. Dx of acute renal failure superimpose on CKD, UTI, hyperkalemia, hyponatremia. Pt with medical history significant of allergic rhinitis, BPH, right bundle branch block, right shoulder bursitis, choledocholithiasis, colon polyps, diverticulosis, CAD calcification seen on CT, depression, type 2 diabetes, hyperlipidemia, hypertension, right inguinal hernia, liver abscess, BPH, outlet obstruction, history of suprapubic catheter placement.  Clinical Impression  Pt admitted with above diagnosis. Pt reports he's feeling stronger and more steady today, and that his pain is much less. He reports he had several falls prior to admission which he attributes to side effects from Bactrim. Today pt was able to walk 120' with RW, no loss of balance. Good progress expected.  Pt currently with functional limitations due to the deficits listed below (see PT Problem List). Pt will benefit from acute skilled PT to increase their independence and safety with mobility to allow discharge.           If plan is discharge home, recommend the following:     Can travel by private vehicle        Equipment Recommendations None recommended by PT  Recommendations for Other Services       Functional Status Assessment Patient has had a recent decline in their functional status and demonstrates the ability to make significant improvements in function in a reasonable and predictable amount of time.     Precautions / Restrictions Precautions Precautions: Fall Recall of Precautions/Restrictions: Intact Precaution/Restrictions Comments: several falls just PTA (pt attributes this to  Bactrim) Restrictions Weight Bearing Restrictions Per Provider Order: No      Mobility  Bed Mobility Overal bed mobility: Modified Independent             General bed mobility comments: HOB up, used rail    Transfers Overall transfer level: Needs assistance Equipment used: Rolling walker (2 wheels) Transfers: Sit to/from Stand Sit to Stand: Contact guard assist           General transfer comment: VCs hand placement    Ambulation/Gait Ambulation/Gait assistance: Supervision Gait Distance (Feet): 120 Feet Assistive device: Rolling walker (2 wheels) Gait Pattern/deviations: Step-through pattern, Decreased stride length Gait velocity: decr     General Gait Details: steady, no loss of balance  Stairs            Wheelchair Mobility     Tilt Bed    Modified Rankin (Stroke Patients Only)       Balance Overall balance assessment: History of Falls, Needs assistance   Sitting balance-Leahy Scale: Good     Standing balance support: Reliant on assistive device for balance, During functional activity, Bilateral upper extremity supported Standing balance-Leahy Scale: Fair                               Pertinent Vitals/Pain Pain Assessment Pain Assessment: No/denies pain    Home Living Family/patient expects to be discharged to:: Private residence Living Arrangements: Alone Available Help at Discharge: Family;Available PRN/intermittently Type of Home: House Home Access: Stairs to enter   [employer] of Steps: 1  (has a ramp available he could put back up)   Home Layout: One  level Home Equipment: Rollator (4 wheels);Cane - single point;Shower seat;Transport chair      Prior Function Prior Level of Function : Independent/Modified Independent;Driving             Mobility Comments: walks with SPC, reports usually ~1 falls/6 months but had several in past few days since starting Bactrim, drives to church ADLs Comments:  independent     Extremity/Trunk Assessment   Upper Extremity Assessment Upper Extremity Assessment: Overall WFL for tasks assessed    Lower Extremity Assessment Lower Extremity Assessment: Overall WFL for tasks assessed    Cervical / Trunk Assessment Cervical / Trunk Assessment: Normal  Communication   Communication Communication: No apparent difficulties    Cognition Arousal: Alert Behavior During Therapy: WFL for tasks assessed/performed   PT - Cognitive impairments: No apparent impairments                         Following commands: Intact       Cueing Cueing Techniques: Verbal cues     General Comments      Exercises     Assessment/Plan    PT Assessment Patient needs continued PT services  PT Problem List Decreased activity tolerance;Decreased mobility       PT Treatment Interventions Gait training;Therapeutic exercise;Patient/family education    PT Goals (Current goals can be found in the Care Plan section)  Acute Rehab PT Goals Patient Stated Goal: likes to watch tv and read PT Goal Formulation: With patient Time For Goal Achievement: 05/12/24 Potential to Achieve Goals: Good    Frequency Min 3X/week     Co-evaluation               AM-PAC PT 6 Clicks Mobility  Outcome Measure Help needed turning from your back to your side while in a flat bed without using bedrails?: None Help needed moving from lying on your back to sitting on the side of a flat bed without using bedrails?: None Help needed moving to and from a bed to a chair (including a wheelchair)?: None Help needed standing up from a chair using your arms (e.g., wheelchair or bedside chair)?: None Help needed to walk in hospital room?: A Little Help needed climbing 3-5 steps with a railing? : A Little 6 Click Score: 22    End of Session Equipment Utilized During Treatment: Gait belt Activity Tolerance: Patient tolerated treatment well Patient left: in chair;with  call bell/phone within reach;with chair alarm set;with family/visitor present Nurse Communication: Mobility status PT Visit Diagnosis: Difficulty in walking, not elsewhere classified (R26.2);History of falling (Z91.81)    Time: 8765-8698 PT Time Calculation (min) (ACUTE ONLY): 27 min   Charges:   PT Evaluation $PT Eval Moderate Complexity: 1 Mod PT Treatments $Gait Training: 8-22 mins PT General Charges $$ ACUTE PT VISIT: 1 Visit        Sylvan Nest Kistler PT 04/28/2024  Acute Rehabilitation Services  Office [phone]

## 2024-04-28 NOTE — Plan of Care (Signed)
   Problem: Nutrition: Goal: Adequate nutrition will be maintained Outcome: Progressing   Problem: Pain Managment: Goal: General experience of comfort will improve and/or be controlled Outcome: Progressing   Problem: Safety: Goal: Ability to remain free from injury will improve Outcome: Progressing

## 2024-04-29 DIAGNOSIS — N390 Urinary tract infection, site not specified: Secondary | ICD-10-CM | POA: Diagnosis not present

## 2024-04-29 DIAGNOSIS — N179 Acute kidney failure, unspecified: Secondary | ICD-10-CM | POA: Diagnosis not present

## 2024-04-29 DIAGNOSIS — D649 Anemia, unspecified: Secondary | ICD-10-CM

## 2024-04-29 DIAGNOSIS — I10 Essential (primary) hypertension: Secondary | ICD-10-CM

## 2024-04-29 DIAGNOSIS — E871 Hypo-osmolality and hyponatremia: Secondary | ICD-10-CM | POA: Diagnosis not present

## 2024-04-29 LAB — GLUCOSE, CAPILLARY
Glucose-Capillary: 94 mg/dL (ref 70–99)
Glucose-Capillary: 107 mg/dL — ABNORMAL HIGH (ref 70–99)
Glucose-Capillary: 109 mg/dL — ABNORMAL HIGH (ref 70–99)
Glucose-Capillary: 132 mg/dL — ABNORMAL HIGH (ref 70–99)

## 2024-04-29 LAB — BASIC METABOLIC PANEL WITH GFR
Anion gap: 7 (ref 5–15)
BUN: 20 mg/dL (ref 8–23)
CO2: 20 mmol/L — ABNORMAL LOW (ref 22–32)
Calcium: 8.6 mg/dL — ABNORMAL LOW (ref 8.9–10.3)
Chloride: 107 mmol/L (ref 98–111)
Creatinine, Ser: 1.33 mg/dL — ABNORMAL HIGH (ref 0.61–1.24)
GFR, Estimated: 54 mL/min — ABNORMAL LOW (ref >60)
Glucose, Bld: 91 mg/dL (ref 70–99)
Potassium: 4.6 mmol/L (ref 3.5–5.1)
Sodium: 134 mmol/L — ABNORMAL LOW (ref 135–145)

## 2024-04-29 LAB — MAGNESIUM: Magnesium: 1.8 mg/dL (ref 1.7–2.4)

## 2024-04-29 MED ORDER — HYDRALAZINE HCL 20 MG/ML IJ SOLN
5.0000 mg | Freq: Four times a day (QID) | INTRAMUSCULAR | Status: AC | PRN
  Administered 2024-04-29: 5 mg via INTRAVENOUS
  Filled 2024-04-29: qty 1

## 2024-04-29 NOTE — Progress Notes (Signed)
 PROGRESS NOTE    Neil [name]  FMW:[id number] DOB: December 12, 1940 DOA: 04/26/2024 PCP: Norleen Lynwood ORN, MD   Brief Narrative: Neil [name] is a 83 y.o. 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  Patient presented secondary to multiple falls at home found to have evidence of UTI with concern for failure of Bactrim.  CT abdomen/pelvis obtained this admission and was significant for nonobstructing right renal stone and diverticulosis.  Patient was started empirically on cefepime  and urine culture obtained.   Assessment and Plan:  AKI on CKD stage IIIa Baseline creatinine of about 1.4.  Creatinine 2.14 on admission.  Patient managed with IV fluids with subsequent improvement in the renal function.  Creatinine down to 1.33.  CAUTI Patient with chronic suprapubic catheter.  Patient reports supra catheter was last changed on 7/7 at his urologist office.  Patient failed Bactrim treatment as an outpatient and was started on cefepime  this admission.  Urine cultures obtained and are so far significant for Pseudomonas aeruginosa with sensitivities pending. - Continue cefepime  - Follow-up urine culture data  Hyponatremia Mild.  Stable.  Hyperkalemia Likely secondary to acute kidney injury noted on admission.  Mild.  Patient given Lokelma .  Resolved.  Anemia of chronic disease Recent hemoglobin appears to be normal in the 13-14 range.  Hemoglobin slightly decreased at 12.3 on admission.  Trended down slightly to 11.0 with IV fluids.  No evidence of bleeding.  Primary hypertension -Continue Toprol -XL  BPH -Continue Flomax   Diabetes mellitus type 2 Well-controlled based on last hemoglobin A1c of 7.0% from March 2025.  Patient is on an outpatient medication regimen. -Continue carb modified diet  Depression -Continue Celexa    DVT prophylaxis:  Lovenox  Code Status:   Code Status: Full Code Family Communication: None at bedside Disposition Plan: Discharge home pending ability to transition to outpatient antibiotic regimen versus continuing inpatient antibiotic regimen   Consultants:  None  Procedures:  None  Antimicrobials: Ceftriaxone  Cefepime    Subjective: Patient reports no specific issues overnight.  Objective: BP (!) 166/67   Pulse (!) 52   Temp 97.9 F (36.6 C) (Oral)   Resp 16   Wt 71.2 kg   SpO2 97%   BMI 23.87 kg/m   Examination:  General exam: Appears calm and comfortable Respiratory system: Clear to auscultation. Respiratory effort normal. Cardiovascular system: S1 & S2 heard, RRR. No murmurs, rubs, gallops or clicks. Gastrointestinal system: Abdomen is nondistended, soft and nontender. Normal bowel sounds heard. Central nervous system: Alert and oriented. No focal neurological deficits. Psychiatry: Judgement and insight appear normal. Mood & affect appropriate.    Data Reviewed: I have personally reviewed following labs and imaging studies  CBC Lab Results  Component Value Date   WBC 6.2 04/28/2024   RBC 3.85 (L) 04/28/2024   HGB 11.0 (L) 04/28/2024   HCT 35.2 (L) 04/28/2024   MCV 91.4 04/28/2024   MCH 28.6 04/28/2024   PLT 200 04/28/2024   MCHC 31.3 04/28/2024   RDW 14.7 04/28/2024   LYMPHSABS 1.9 01/09/2024   MONOABS 0.8 01/09/2024   EOSABS 0.4 01/09/2024   BASOSABS 0.1 01/09/2024     Last metabolic panel Lab Results  Component Value Date   NA 134 (L) 04/29/2024   K 4.6 04/29/2024   CL 107 04/29/2024   CO2 20 (L) 04/29/2024   BUN 20 04/29/2024   CREATININE 1.33 (H) 04/29/2024  GLUCOSE 91 04/29/2024   GFRNONAA 54 (L) 04/29/2024   GFRAA >60 05/28/2015   CALCIUM 8.6 (L) 04/29/2024   PHOS 2.3 (L) 09/06/2021   PROT 6.1 (L) 04/28/2024   ALBUMIN 3.3 (L) 04/28/2024   BILITOT 0.5 04/28/2024   ALKPHOS 20 (L) 04/28/2024   AST 17 04/28/2024   ALT 13 04/28/2024   ANIONGAP 7  04/29/2024    GFR: Estimated Creatinine Clearance: 42.1 mL/min (A) (by C-G formula based on SCr of 1.33 mg/dL (H)).  No results found for this or any previous visit (from the past 240 hours).    Radiology Studies: No results found.    LOS: 2 days    Elgin Lam, MD [facility] 04/29/2024, 10:01 AM   If 7PM-7AM, please contact night-coverage [email or web address]

## 2024-04-29 NOTE — Progress Notes (Signed)
 Mobility Specialist - Progress Note   04/29/24 1500  Mobility  Activity Ambulated with assistance in hallway  Level of Assistance Contact guard assist, steadying assist  Assistive Device Front wheel walker  Distance Ambulated (ft) 500 ft  Range of Motion/Exercises Active  Activity Response Tolerated well  Mobility Referral Yes  Mobility visit 1 Mobility  Mobility Specialist Start Time (ACUTE ONLY) 1535  Mobility Specialist Stop Time (ACUTE ONLY) 1547  Mobility Specialist Time Calculation (min) (ACUTE ONLY) 12 min   Received in bed and agreed to mobility. Had no issues throughout session and returned to bed with all needs met.  Cyndee Ada Mobility Specialist

## 2024-04-29 NOTE — Plan of Care (Signed)
  Problem: Nutrition: Goal: Adequate nutrition will be maintained Outcome: Progressing   Problem: Safety: Goal: Ability to remain free from injury will improve Outcome: Progressing   

## 2024-04-29 NOTE — Progress Notes (Signed)
   04/29/24 1454  TOC Brief Assessment  Insurance and Status Reviewed  Patient has primary care physician Yes  Home environment has been reviewed home alone  Prior level of function: independent  Prior/Current Home Services No current home services  Social Drivers of Health Review SDOH reviewed no interventions necessary  Readmission risk has been reviewed Yes  Transition of care needs transition of care needs identified, TOC will continue to follow

## 2024-04-29 NOTE — Hospital Course (Addendum)
 Neil [name] is a 83 y.o. 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  Patient presented secondary to multiple falls at home found to have evidence of UTI with concern for failure of Bactrim.  CT abdomen/pelvis obtained this admission and was significant for nonobstructing right renal stone and diverticulosis.  Patient was started empirically on cefepime  and urine culture obtained revealing pseudomonas aeruginosa, resistant to ciprofloxacin . Patient completed a 7-day course of Cefepime  prior to discharge.

## 2024-04-30 DIAGNOSIS — I10 Essential (primary) hypertension: Secondary | ICD-10-CM | POA: Diagnosis not present

## 2024-04-30 DIAGNOSIS — N390 Urinary tract infection, site not specified: Secondary | ICD-10-CM | POA: Diagnosis not present

## 2024-04-30 DIAGNOSIS — N179 Acute kidney failure, unspecified: Secondary | ICD-10-CM | POA: Diagnosis not present

## 2024-04-30 DIAGNOSIS — E871 Hypo-osmolality and hyponatremia: Secondary | ICD-10-CM | POA: Diagnosis not present

## 2024-04-30 LAB — BASIC METABOLIC PANEL WITH GFR
Anion gap: 7 (ref 5–15)
BUN: 19 mg/dL (ref 8–23)
CO2: 22 mmol/L (ref 22–32)
Calcium: 9 mg/dL (ref 8.9–10.3)
Chloride: 107 mmol/L (ref 98–111)
Creatinine, Ser: 1.27 mg/dL — ABNORMAL HIGH (ref 0.61–1.24)
GFR, Estimated: 56 mL/min — ABNORMAL LOW (ref >60)
Glucose, Bld: 104 mg/dL — ABNORMAL HIGH (ref 70–99)
Potassium: 4.4 mmol/L (ref 3.5–5.1)
Sodium: 136 mmol/L (ref 135–145)

## 2024-04-30 LAB — CBC
HCT: 35.6 % — ABNORMAL LOW (ref 39.0–52.0)
Hemoglobin: 11.7 g/dL — ABNORMAL LOW (ref 13.0–17.0)
MCH: 29.3 pg (ref 26.0–34.0)
MCHC: 32.9 g/dL (ref 30.0–36.0)
MCV: 89 fL (ref 80.0–100.0)
Platelets: 216 K/uL (ref 150–400)
RBC: 4 MIL/uL — ABNORMAL LOW (ref 4.22–5.81)
RDW: 14.3 % (ref 11.5–15.5)
WBC: 6.7 K/uL (ref 4.0–10.5)
nRBC: 0 % (ref 0.0–0.2)

## 2024-04-30 LAB — GLUCOSE, CAPILLARY
Glucose-Capillary: 104 mg/dL — ABNORMAL HIGH (ref 70–99)
Glucose-Capillary: 110 mg/dL — ABNORMAL HIGH (ref 70–99)
Glucose-Capillary: 120 mg/dL — ABNORMAL HIGH (ref 70–99)

## 2024-04-30 MED ORDER — BENAZEPRIL HCL 20 MG PO TABS
40.0000 mg | ORAL_TABLET | Freq: Every day | ORAL | Status: DC
  Administered 2024-04-30 – 2024-05-02 (×3): 40 mg via ORAL
  Filled 2024-04-30 (×3): qty 2

## 2024-04-30 MED ORDER — ENOXAPARIN SODIUM 40 MG/0.4ML IJ SOSY
40.0000 mg | PREFILLED_SYRINGE | INTRAMUSCULAR | Status: DC
  Administered 2024-04-30 – 2024-05-02 (×3): 40 mg via SUBCUTANEOUS
  Filled 2024-04-30 (×3): qty 0.4

## 2024-04-30 NOTE — Progress Notes (Signed)
 Physical Therapy Treatment Patient Details Name: Neil [name] MRN: [id number] DOB: 07/11/1941 Today's Date: 04/30/2024   History of Present Illness 83 y.o. male who presented to the emergency department with complaints of having 2 falls.  He has been having generalized weakness as well.  He was recently put on Bactrim due to UTI. Dx of acute renal failure superimpose on CKD, UTI, hyperkalemia, hyponatremia. Pt with medical history significant of allergic rhinitis, BPH, right bundle branch block, right shoulder bursitis, choledocholithiasis, colon polyps, diverticulosis, CAD calcification seen on CT, depression, type 2 diabetes, hyperlipidemia, hypertension, right inguinal hernia, liver abscess, BPH, outlet obstruction, history of suprapubic catheter placement.    PT Comments  Pt is progressing well with mobility, he ambulated 500' with RW, no loss of balance. Pt was instructed in seated BLE strengthening exercises and was encouraged to perform these independently to minimize deconditioning during hospitalization. PT goals have been meet, PT signing off, mobility team to follow.      If plan is discharge home, recommend the following: Assistance with cooking/housework;Help with stairs or ramp for entrance   Can travel by private vehicle        Equipment Recommendations  None recommended by PT    Recommendations for Other Services       Precautions / Restrictions Precautions Precautions: Fall Recall of Precautions/Restrictions: Intact Precaution/Restrictions Comments: several falls just PTA (pt attributes this to Bactrim) Restrictions Weight Bearing Restrictions Per Provider Order: No     Mobility  Bed Mobility Overal bed mobility: Modified Independent             General bed mobility comments: HOB up, used rail    Transfers Overall transfer level: Modified independent Equipment used: Rolling walker (2 wheels) Transfers: Sit to/from Stand Sit to Stand: Modified  independent (Device/Increase time)           General transfer comment: good awareness of safe hand placement    Ambulation/Gait Ambulation/Gait assistance: Modified independent (Device/Increase time) Gait Distance (Feet): 500 Feet Assistive device: Rolling walker (2 wheels) Gait Pattern/deviations: WFL(Within Functional Limits) Gait velocity: WFL     General Gait Details: steady, no loss of balance   Stairs             Wheelchair Mobility     Tilt Bed    Modified Rankin (Stroke Patients Only)       Balance Overall balance assessment: History of Falls, Modified Independent   Sitting balance-Leahy Scale: Good     Standing balance support: Reliant on assistive device for balance, During functional activity, Bilateral upper extremity supported Standing balance-Leahy Scale: Fair                              Hotel manager: No apparent difficulties  Cognition Arousal: Alert Behavior During Therapy: WFL for tasks assessed/performed   PT - Cognitive impairments: No apparent impairments                         Following commands: Intact      Cueing Cueing Techniques: Verbal cues  Exercises General Exercises - Lower Extremity Ankle Circles/Pumps: AROM, Both, 5 reps, Seated Long Arc Quad: AROM, Both, 10 reps, Seated Hip Flexion/Marching: AROM, Both, 10 reps, Seated    General Comments        Pertinent Vitals/Pain Pain Assessment Pain Assessment: No/denies pain    Home Living  Prior Function            PT Goals (current goals can now be found in the care plan section) Acute Rehab PT Goals Patient Stated Goal: likes to watch tv and read PT Goal Formulation: With patient Time For Goal Achievement: 05/12/24 Potential to Achieve Goals: Good Progress towards PT goals: Goals met/education completed, patient discharged from PT    Frequency    Min  3X/week      PT Plan      Co-evaluation              AM-PAC PT 6 Clicks Mobility   Outcome Measure  Help needed turning from your back to your side while in a flat bed without using bedrails?: None Help needed moving from lying on your back to sitting on the side of a flat bed without using bedrails?: None Help needed moving to and from a bed to a chair (including a wheelchair)?: None Help needed standing up from a chair using your arms (e.g., wheelchair or bedside chair)?: None Help needed to walk in hospital room?: None Help needed climbing 3-5 steps with a railing? : None 6 Click Score: 24    End of Session Equipment Utilized During Treatment: Gait belt Activity Tolerance: Patient tolerated treatment well Patient left: in chair;with call bell/phone within reach;with chair alarm set;with family/visitor present Nurse Communication: Mobility status PT Visit Diagnosis: Difficulty in walking, not elsewhere classified (R26.2);History of falling (Z91.81)     Time: 8875-8855 PT Time Calculation (min) (ACUTE ONLY): 20 min  Charges:    $Gait Training: 8-22 mins PT General Charges $$ ACUTE PT VISIT: 1 Visit                     Sylvan Nest Kistler PT 04/30/2024  Acute Rehabilitation Services  Office [phone]

## 2024-04-30 NOTE — Progress Notes (Signed)
 PROGRESS NOTE    Neil [name]  FMW:[id number] DOB: 21-May-1941 DOA: 04/26/2024 PCP: Norleen Lynwood ORN, MD   Brief Narrative: Neil [name] is a 83 y.o. 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  Patient presented secondary to multiple falls at home found to have evidence of UTI with concern for failure of Bactrim.  CT abdomen/pelvis obtained this admission and was significant for nonobstructing right renal stone and diverticulosis.  Patient was started empirically on cefepime  and urine culture obtained.   Assessment and Plan:  AKI on CKD stage IIIa Baseline creatinine of about 1.4.  Creatinine 2.14 on admission.  Patient managed with IV fluids with subsequent improvement in the renal function.  Creatinine down to 1.33.  CAUTI Patient with chronic suprapubic catheter.  Patient reports supra catheter was last changed on 7/7 at his urologist office.  Patient failed Bactrim treatment as an outpatient and was started on cefepime  this admission.  Urine cultures obtained and are so far significant for Pseudomonas aeruginosa with sensitivities pending. - Continue cefepime  - Follow-up urine culture data  Hyponatremia Mild.  Stable.  Hyperkalemia Likely secondary to acute kidney injury noted on admission.  Mild.  Patient given Lokelma .  Resolved.  Anemia of chronic disease Recent hemoglobin appears to be normal in the 13-14 range.  Hemoglobin slightly decreased at 12.3 on admission.  Trended down slightly to 11.0 with IV fluids.  No evidence of bleeding.  Primary hypertension -Continue Toprol -XL  BPH -Continue Flomax   Diabetes mellitus type 2 Well-controlled based on last hemoglobin A1c of 7.0% from March 2025.  Patient is on an outpatient medication regimen. -Continue carb modified diet  Depression -Continue Celexa    DVT prophylaxis:  Lovenox  Code Status:   Code Status: Full Code Family Communication: Stepdaughter at bedside Disposition Plan: Discharge home pending ability to transition to outpatient antibiotic regimen versus continuing inpatient antibiotic regimen   Consultants:  None  Procedures:  None  Antimicrobials: Ceftriaxone  Cefepime    Subjective: No concerns this morning.  Objective: BP (!) 153/76   Pulse 81   Temp 98.3 F (36.8 C) (Oral)   Resp 18   Ht 5' 8 (1.727 m)   Wt 72.5 kg   SpO2 98%   BMI 24.30 kg/m   Examination:  General exam: Appears calm and comfortable Respiratory system: Clear to auscultation. Respiratory effort normal. Cardiovascular system: S1 & S2 heard, RRR. No murmurs. Gastrointestinal system: Abdomen is nondistended, soft and nontender. Normal bowel sounds heard. Central nervous system: Alert and oriented. No focal neurological deficits. Psychiatry: Judgement and insight appear normal. Mood & affect appropriate.    Data Reviewed: I have personally reviewed following labs and imaging studies  CBC Lab Results  Component Value Date   WBC 6.7 04/30/2024   RBC 4.00 (L) 04/30/2024   HGB 11.7 (L) 04/30/2024   HCT 35.6 (L) 04/30/2024   MCV 89.0 04/30/2024   MCH 29.3 04/30/2024   PLT 216 04/30/2024   MCHC 32.9 04/30/2024   RDW 14.3 04/30/2024   LYMPHSABS 1.9 01/09/2024   MONOABS 0.8 01/09/2024   EOSABS 0.4 01/09/2024   BASOSABS 0.1 01/09/2024     Last metabolic panel Lab Results  Component Value Date   NA 136 04/30/2024   K 4.4 04/30/2024   CL 107 04/30/2024   CO2 22 04/30/2024   BUN 19 04/30/2024   CREATININE 1.27 (H) 04/30/2024   GLUCOSE 104 (  H) 04/30/2024   GFRNONAA 56 (L) 04/30/2024   GFRAA >60 05/28/2015   CALCIUM 9.0 04/30/2024   PHOS 2.3 (L) 09/06/2021   PROT 6.1 (L) 04/28/2024   ALBUMIN 3.3 (L) 04/28/2024   BILITOT 0.5 04/28/2024   ALKPHOS 20 (L) 04/28/2024   AST 17 04/28/2024   ALT 13 04/28/2024   ANIONGAP 7 04/30/2024     GFR: Estimated Creatinine Clearance: 43.4 mL/min (A) (by C-G formula based on SCr of 1.27 mg/dL (H)).  Recent Results (from the past 240 hours)  Culture, Urine (Do not remove urinary catheter, catheter placed by urology or difficult to place)     Status: Abnormal (Preliminary result)   Collection Time: 04/27/24  1:01 AM   Specimen: Urine, Catheterized  Result Value Ref Range Status   Specimen Description   Final    URINE, CATHETERIZED Performed at [facility], [address] [place], [place] [address]    Special Requests   Final    NONE Performed at [facility], [address], [place], [place] [address]    Culture (A)  Final    40,000 COLONIES/mL PSEUDOMONAS AERUGINOSA CULTURE REINCUBATED FOR BETTER GROWTH Performed at [facility], [address], [place], [place] [address]    Report Status PENDING  Incomplete      Radiology Studies: No results found.    LOS: 3 days    Elgin Lam, MD [facility] 04/30/2024, 1:02 PM   If 7PM-7AM, please contact night-coverage [email or web address]

## 2024-05-01 DIAGNOSIS — N390 Urinary tract infection, site not specified: Secondary | ICD-10-CM | POA: Diagnosis not present

## 2024-05-01 DIAGNOSIS — E871 Hypo-osmolality and hyponatremia: Secondary | ICD-10-CM | POA: Diagnosis not present

## 2024-05-01 DIAGNOSIS — I10 Essential (primary) hypertension: Secondary | ICD-10-CM | POA: Diagnosis not present

## 2024-05-01 DIAGNOSIS — N179 Acute kidney failure, unspecified: Secondary | ICD-10-CM | POA: Diagnosis not present

## 2024-05-01 LAB — URINE CULTURE: Culture: 40000 — AB

## 2024-05-01 LAB — GLUCOSE, CAPILLARY: Glucose-Capillary: 115 mg/dL — ABNORMAL HIGH (ref 70–99)

## 2024-05-01 NOTE — Progress Notes (Signed)
 Mobility Specialist - Progress Note   05/01/24 0933  Therapy Vitals  Pulse Rate 100  BP (!) 161/74  Patient Position (if appropriate) Lying  Mobility  Activity Ambulated with assistance in hallway  Level of Assistance Modified independent, requires aide device or extra time  Assistive Device Front wheel walker  Distance Ambulated (ft) 500 ft  Range of Motion/Exercises Active  Activity Response Tolerated well  Mobility Referral Yes  Mobility visit 1 Mobility  Mobility Specialist Start Time (ACUTE ONLY) [id number]  Mobility Specialist Stop Time (ACUTE ONLY) 0954  Mobility Specialist Time Calculation (min) (ACUTE ONLY) 17 min   Received in bed and agreed to mobility. No issues throughout session, standing rest break at window to enjoy view. Returned to chair with all needs met.  Cyndee Ada Mobility Specialist

## 2024-05-01 NOTE — Plan of Care (Signed)
  Problem: Education: Goal: Ability to describe self-care measures that may prevent or decrease complications (Diabetes Survival Skills Education) will improve Outcome: Progressing   Problem: Coping: Goal: Ability to adjust to condition or change in health will improve Outcome: Progressing   Problem: Nutritional: Goal: Maintenance of adequate nutrition will improve Outcome: Progressing   Problem: Clinical Measurements: Goal: Will remain free from infection Outcome: Progressing Goal: Diagnostic test results will improve Outcome: Progressing   Problem: Nutrition: Goal: Adequate nutrition will be maintained Outcome: Progressing

## 2024-05-01 NOTE — Plan of Care (Signed)
   Problem: Education: Goal: Ability to describe self-care measures that may prevent or decrease complications (Diabetes Survival Skills Education) will improve Outcome: Progressing Goal: Individualized Educational Video(s) Outcome: Progressing   Problem: Coping: Goal: Ability to adjust to condition or change in health will improve Outcome: Progressing

## 2024-05-01 NOTE — Progress Notes (Signed)
 PROGRESS NOTE    Neil [name]  FMW:[id number] DOB: [birth year] DOA: 04/26/2024 PCP: Norleen Lynwood ORN, MD   Brief Narrative: Neil [name] is a 83 y.o. 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  Patient presented secondary to multiple falls at home found to have evidence of UTI with concern for failure of Bactrim.  CT abdomen/pelvis obtained this admission and was significant for nonobstructing right renal stone and diverticulosis.  Patient was started empirically on cefepime  and urine culture obtained.   Assessment and Plan:  AKI on CKD stage IIIa Baseline creatinine of about 1.4.  Creatinine 2.14 on admission.  Patient managed with IV fluids with subsequent improvement in the renal function.  Creatinine down to 1.27.  CAUTI Patient with chronic suprapubic catheter.  Patient reports supra catheter was last changed on 7/7 at his urologist office.  Patient failed Bactrim treatment as an outpatient and was started on cefepime  this admission.  Urine cultures obtained and are so far significant for Pseudomonas aeruginosa with resistance to ciprofloxacin . - Continue cefepime  x7 days  Hyponatremia Mild.  Stable.  Hyperkalemia Likely secondary to acute kidney injury noted on admission.  Mild.  Patient given Lokelma .  Resolved.  Anemia of chronic disease Recent hemoglobin appears to be normal in the 13-14 range.  Hemoglobin slightly decreased at 12.3 on admission.  Trended down slightly to 11.0 with IV fluids.  No evidence of bleeding.  Primary hypertension -Continue Toprol -XL  BPH -Continue Flomax   Diabetes mellitus type 2 Well-controlled based on last hemoglobin A1c of 7.0% from March 2025.  Patient is on an outpatient medication regimen. -Continue carb modified diet  Depression -Continue Celexa    DVT prophylaxis: Lovenox  Code  Status:   Code Status: Full Code Family Communication: Stepdaughter on telephone. Disposition Plan: Discharge in 2 days   Consultants:  None  Procedures:  None  Antimicrobials: Ceftriaxone  Cefepime    Subjective: No concerns this morning.  Objective: BP (!) 161/74 (BP Location: Right Arm)   Pulse 100   Temp 98.8 F (37.1 C) (Oral)   Resp 18   Ht 5' 8 (1.727 m)   Wt 70.2 kg   SpO2 95%   BMI 23.53 kg/m   Examination:  General exam: Appears calm and comfortable Respiratory system: Respiratory effort normal. Central nervous system: Alert and oriented. No focal neurological deficits. Psychiatry: Judgement and insight appear normal. Mood & affect appropriate.    Data Reviewed: I have personally reviewed following labs and imaging studies  CBC Lab Results  Component Value Date   WBC 6.7 04/30/2024   RBC 4.00 (L) 04/30/2024   HGB 11.7 (L) 04/30/2024   HCT 35.6 (L) 04/30/2024   MCV 89.0 04/30/2024   MCH 29.3 04/30/2024   PLT 216 04/30/2024   MCHC 32.9 04/30/2024   RDW 14.3 04/30/2024   LYMPHSABS 1.9 01/09/2024   MONOABS 0.8 01/09/2024   EOSABS 0.4 01/09/2024   BASOSABS 0.1 01/09/2024     Last metabolic panel Lab Results  Component Value Date   NA 136 04/30/2024   K 4.4 04/30/2024   CL 107 04/30/2024   CO2 22 04/30/2024   BUN 19 04/30/2024   CREATININE 1.27 (H) 04/30/2024   GLUCOSE 104 (H) 04/30/2024   GFRNONAA 56 (L) 04/30/2024   GFRAA >60 05/28/2015   CALCIUM 9.0 04/30/2024   PHOS 2.3 (L) 09/06/2021   PROT 6.1 (L) 04/28/2024   ALBUMIN  3.3 (L) 04/28/2024   BILITOT 0.5 04/28/2024   ALKPHOS 20 (L) 04/28/2024   AST 17 04/28/2024   ALT 13 04/28/2024   ANIONGAP 7 04/30/2024    GFR: Estimated Creatinine Clearance: 43.4 mL/min (A) (by C-G formula based on SCr of 1.27 mg/dL (H)).  Recent Results (from the past 240 hours)  Culture, Urine (Do not remove urinary catheter, catheter placed by urology or difficult to place)     Status: Abnormal    Collection Time: 04/27/24  1:01 AM   Specimen: Urine, Catheterized  Result Value Ref Range Status   Specimen Description   Final    URINE, CATHETERIZED Performed at [facility], [address], [place], [place] [address]    Special Requests   Final    NONE Performed at [facility], [address], [place], [place] [address]    Culture 40,000 COLONIES/mL PSEUDOMONAS AERUGINOSA (A)  Final   Report Status 05/01/2024 FINAL  Final   Organism ID, Bacteria PSEUDOMONAS AERUGINOSA (A)  Final      Susceptibility   Pseudomonas aeruginosa - MIC*    CEFTAZIDIME <=1 SENSITIVE Sensitive     CIPROFLOXACIN  2 RESISTANT Resistant     GENTAMICIN <=1 SENSITIVE Sensitive     IMIPENEM >=16 RESISTANT Resistant     PIP/TAZO <=4 SENSITIVE Sensitive ug/mL    CEFEPIME  0.5 SENSITIVE Sensitive     * 40,000 COLONIES/mL PSEUDOMONAS AERUGINOSA      Radiology Studies: No results found.    LOS: 4 days    Elgin Lam, MD [facility] 05/01/2024, 11:48 AM   If 7PM-7AM, please contact night-coverage [email or web address]

## 2024-05-02 DIAGNOSIS — N179 Acute kidney failure, unspecified: Secondary | ICD-10-CM | POA: Diagnosis not present

## 2024-05-02 DIAGNOSIS — E871 Hypo-osmolality and hyponatremia: Secondary | ICD-10-CM | POA: Diagnosis not present

## 2024-05-02 DIAGNOSIS — I10 Essential (primary) hypertension: Secondary | ICD-10-CM | POA: Diagnosis not present

## 2024-05-02 DIAGNOSIS — N390 Urinary tract infection, site not specified: Secondary | ICD-10-CM | POA: Diagnosis not present

## 2024-05-02 LAB — GLUCOSE, CAPILLARY: Glucose-Capillary: 119 mg/dL — ABNORMAL HIGH (ref 70–99)

## 2024-05-02 NOTE — Plan of Care (Signed)

## 2024-05-02 NOTE — Progress Notes (Signed)
 PROGRESS NOTE    Neil [name]  FMW:[id number] DOB: 1940-12-03 DOA: 04/26/2024 PCP: Norleen Lynwood ORN, MD   Brief Narrative: Neil [name] is a 83 y.o. 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  Patient presented secondary to multiple falls at home found to have evidence of UTI with concern for failure of Bactrim.  CT abdomen/pelvis obtained this admission and was significant for nonobstructing right renal stone and diverticulosis.  Patient was started empirically on cefepime  and urine culture obtained.   Assessment and Plan:  AKI on CKD stage IIIa Baseline creatinine of about 1.4.  Creatinine 2.14 on admission.  Patient managed with IV fluids with subsequent improvement in the renal function.  Creatinine down to 1.27.  CAUTI Patient with chronic suprapubic catheter.  Patient reports supra catheter was last changed on 7/7 at his urologist office.  Patient failed Bactrim treatment as an outpatient and was started on cefepime  this admission.  Urine cultures obtained and are so far significant for Pseudomonas aeruginosa with resistance to ciprofloxacin . -Continue cefepime  x7 days  Hyponatremia Mild.  Stable.  Hyperkalemia Likely secondary to acute kidney injury noted on admission.  Mild.  Patient given Lokelma .  Resolved.  Anemia of chronic disease Recent hemoglobin appears to be normal in the 13-14 range.  Hemoglobin slightly decreased at 12.3 on admission.  Trended down slightly to 11.0 with IV fluids.  No evidence of bleeding.  Primary hypertension -Continue Toprol -XL  BPH -Continue Flomax   Diabetes mellitus type 2 Well-controlled based on last hemoglobin A1c of 7.0% from March 2025.  Patient is on an outpatient medication regimen. -Continue carb modified diet  Depression -Continue Celexa    DVT prophylaxis: Lovenox  Code  Status:   Code Status: Full Code Family Communication: None at bedside Disposition Plan: Discharge home in 24 hours   Consultants:  None  Procedures:  None  Antimicrobials: Ceftriaxone  Cefepime    Subjective: No issues from overnight.  Objective: BP (!) 157/87 (BP Location: Right Arm)   Pulse 70   Temp 98.7 F (37.1 C) (Oral)   Resp 18   Ht 5' 8 (1.727 m)   Wt 70.5 kg   SpO2 96%   BMI 23.63 kg/m   Examination:  General exam: Appears calm and comfortable  Respiratory system: Respiratory effort normal. Central nervous system: Alert and oriented. No focal neurological deficits. Psychiatry: Judgement and insight appear normal. Mood & affect appropriate.    Data Reviewed: I have personally reviewed following labs and imaging studies  CBC Lab Results  Component Value Date   WBC 6.7 04/30/2024   RBC 4.00 (L) 04/30/2024   HGB 11.7 (L) 04/30/2024   HCT 35.6 (L) 04/30/2024   MCV 89.0 04/30/2024   MCH 29.3 04/30/2024   PLT 216 04/30/2024   MCHC 32.9 04/30/2024   RDW 14.3 04/30/2024   LYMPHSABS 1.9 01/09/2024   MONOABS 0.8 01/09/2024   EOSABS 0.4 01/09/2024   BASOSABS 0.1 01/09/2024     Last metabolic panel Lab Results  Component Value Date   NA 136 04/30/2024   K 4.4 04/30/2024   CL 107 04/30/2024   CO2 22 04/30/2024   BUN 19 04/30/2024   CREATININE 1.27 (H) 04/30/2024   GLUCOSE 104 (H) 04/30/2024   GFRNONAA 56 (L) 04/30/2024   GFRAA >60 05/28/2015   CALCIUM 9.0 04/30/2024   PHOS 2.3 (L) 09/06/2021   PROT 6.1 (L) 04/28/2024  ALBUMIN 3.3 (L) 04/28/2024   BILITOT 0.5 04/28/2024   ALKPHOS 20 (L) 04/28/2024   AST 17 04/28/2024   ALT 13 04/28/2024   ANIONGAP 7 04/30/2024    GFR: Estimated Creatinine Clearance: 43.4 mL/min (A) (by C-G formula based on SCr of 1.27 mg/dL (H)).  Recent Results (from the past 240 hours)  Culture, Urine (Do not remove urinary catheter, catheter placed by urology or difficult to place)     Status: Abnormal    Collection Time: 04/27/24  1:01 AM   Specimen: Urine, Catheterized  Result Value Ref Range Status   Specimen Description   Final    URINE, CATHETERIZED Performed at [facility], [address] [place], [place] [address]    Special Requests   Final    NONE Performed at [facility], [address], [place], [place] [address]    Culture 40,000 COLONIES/mL PSEUDOMONAS AERUGINOSA (A)  Final   Report Status 05/01/2024 FINAL  Final   Organism ID, Bacteria PSEUDOMONAS AERUGINOSA (A)  Final      Susceptibility   Pseudomonas aeruginosa - MIC*    CEFTAZIDIME <=1 SENSITIVE Sensitive     CIPROFLOXACIN  2 RESISTANT Resistant     GENTAMICIN <=1 SENSITIVE Sensitive     IMIPENEM >=16 RESISTANT Resistant     PIP/TAZO <=4 SENSITIVE Sensitive ug/mL    CEFEPIME  0.5 SENSITIVE Sensitive     * 40,000 COLONIES/mL PSEUDOMONAS AERUGINOSA      Radiology Studies: No results found.    LOS: 5 days    Elgin Lam, MD [facility] 05/02/2024, 11:40 AM   If 7PM-7AM, please contact night-coverage [email or web address]

## 2024-05-03 DIAGNOSIS — N179 Acute kidney failure, unspecified: Secondary | ICD-10-CM | POA: Diagnosis not present

## 2024-05-03 DIAGNOSIS — N1831 Chronic kidney disease, stage 3a: Secondary | ICD-10-CM | POA: Diagnosis not present

## 2024-05-03 DIAGNOSIS — N390 Urinary tract infection, site not specified: Secondary | ICD-10-CM | POA: Diagnosis not present

## 2024-05-03 NOTE — Discharge Summary (Addendum)
 Physician Discharge Summary   Patient: Neil [name] MRN: [id number] DOB: 1940/10/27  Admit date:     04/26/2024  Discharge date: 05/03/2024  Discharge Physician: Elgin Lam, MD   PCP: Norleen Lynwood ORN, MD   Recommendations at discharge:  PCP visit for hospital follow-up  Discharge Diagnoses: Principal Problem:   Acute renal failure superimposed on stage 3a chronic kidney disease (HCC) Active Problems:   Hyperlipidemia   BPH (benign prostatic hyperplasia)   Normocytic anemia   Essential hypertension, benign   Diabetes mellitus type 2 in nonobese (HCC)   Hyperkalemia   Hyponatremia   Acute UTI (urinary tract infection)  Resolved Problems:   * No resolved hospital problems. *  Hospital Course: Neil [name] is a 83 y.o. 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  Patient presented secondary to multiple falls at home found to have evidence of UTI with concern for failure of Bactrim.  CT abdomen/pelvis obtained this admission and was significant for nonobstructing right renal stone and diverticulosis.  Patient was started empirically on cefepime  and urine culture obtained revealing pseudomonas aeruginosa, resistant to ciprofloxacin . Patient completed a 7-day course of Cefepime  prior to discharge.  Assessment and Plan:  AKI on CKD stage IIIa Baseline creatinine of about 1.4.  Creatinine 2.14 on admission.  Patient managed with IV fluids with subsequent improvement in the renal function.  Creatinine down to 1.27.   CAUTI Present on admission. Patient with chronic suprapubic catheter.  Patient reports supra catheter was last changed on 7/7 at his urologist office.  Patient failed Bactrim treatment as an outpatient and was started on cefepime  this admission.  Urine cultures obtained and are so far significant for Pseudomonas aeruginosa with  resistance to ciprofloxacin . Patient completed a 7-day course of Cefepime .   Hyponatremia Mild.  Stable.   Hyperkalemia Likely secondary to acute kidney injury noted on admission.  Mild.  Patient given Lokelma .  Resolved.   Anemia of chronic disease Recent hemoglobin appears to be normal in the 13-14 range.  Hemoglobin slightly decreased at 12.3 on admission.  Trended down slightly to 11.0 with IV fluids.  No evidence of bleeding.   Primary hypertension Continue Toprol -XL   BPH Continue Flomax    Diabetes mellitus type 2 Well-controlled based on last hemoglobin A1c of 7.0% from March 2025.  Patient is on an outpatient medication regimen.   Depression Continue Celexa    Consultants: None Procedures performed: None  Disposition: Home Diet recommendation: Carb modified diet   DISCHARGE MEDICATION: Allergies as of 05/03/2024       Reactions   Statins Other (See Comments)   Joint pain   Zanaflex  [tizanidine ] Other (See Comments)   Dizzy, sleepy        Medication List     PAUSE taking these medications    metoprolol  succinate 50 MG 24 hr tablet Wait to take this until your doctor or other care provider tells you to start again. Commonly known as: TOPROL -XL TAKE 1 TABLET BY MOUTH DAILY  WITH OR IMMEDIATELY FOLLOWING A  MEAL       STOP taking these medications    sulfamethoxazole-trimethoprim 800-160 MG tablet Commonly known as: BACTRIM DS       TAKE these medications    acetaminophen  500 MG tablet Commonly known as: TYLENOL  Take 1,500 mg by mouth daily as needed for mild pain.   aspirin  EC 81 MG tablet Take  81 mg by mouth daily.   BEANO PO Take 1 tablet by mouth at bedtime.   benazepril  40 MG tablet Commonly known as: LOTENSIN  Take 40 mg by mouth daily.   citalopram  10 MG tablet Commonly known as: CELEXA  TAKE 1 TABLET BY MOUTH DAILY   cyclobenzaprine  5 MG tablet Commonly known as: FLEXERIL  TAKE 1 TABLET BY MOUTH 3 TIMES  DAILY AS NEEDED FOR  MUSCLE  SPASM(S)   docusate sodium  100 MG capsule Commonly known as: COLACE Take 100 mg by mouth daily as needed for mild constipation or moderate constipation.   ezetimibe  10 MG tablet Commonly known as: ZETIA  TAKE 1 TABLET BY MOUTH DAILY   fenofibrate  160 MG tablet TAKE 1 TABLET BY MOUTH AT  BEDTIME   finasteride  5 MG tablet Commonly known as: PROSCAR  TAKE 1 TABLET BY MOUTH DAILY   ibuprofen 200 MG tablet Commonly known as: ADVIL Take 600 mg by mouth 2 (two) times daily as needed for moderate pain.   methenamine  1 g tablet Commonly known as: HIPREX  Take 1 tablet (1 g total) by mouth 2 (two) times daily.   multivitamin with minerals Tabs tablet Take 1 tablet by mouth daily.   Myrbetriq 25 MG Tb24 tablet Generic drug: mirabegron ER Take 25 mg by mouth daily.   oxybutynin  10 MG 24 hr tablet Commonly known as: DITROPAN -XL Take 10 mg by mouth daily.   silodosin  4 MG Caps capsule Commonly known as: RAPAFLO  Take 1 capsule (4 mg total) by mouth daily with breakfast.   sodium chloride  0.65 % nasal spray Commonly known as: OCEAN Place 1 spray into the nose daily as needed for congestion.   terazosin  2 MG capsule Commonly known as: HYTRIN  Take 1 capsule (2 mg total) by mouth at bedtime.   traZODone  50 MG tablet Commonly known as: DESYREL  TAKE 1/2 TO 1 TABLET BY MOUTH AT BEDTIME AS NEEDED FOR SLEEP   Vitamin D  50 MCG (2000 UT) Caps Take 2,000 Units by mouth at bedtime.        Follow-up Information     Norleen Lynwood ORN, MD. Schedule an appointment as soon as possible for a visit in 1 week(s).   Specialties: Internal Medicine, Radiology Why: For hospital follow-up Contact information: [address] [place] [address] [phone]                Discharge Exam: BP (!) 154/70 (BP Location: Right Arm)   Pulse (!) 59   Temp 97.8 F (36.6 C) (Oral)   Resp 18   Ht 5' 8 (1.727 m)   Wt 70.2 kg   SpO2 98%   BMI 23.53 kg/m   General exam:  Appears calm and comfortable Respiratory system: Respiratory effort normal. Central nervous system: Alert and oriented. Psychiatry: Judgement and insight appear normal. Mood & affect appropriate.   Condition at discharge: stable  The results of significant diagnostics from this hospitalization (including imaging, microbiology, ancillary and laboratory) are listed below for reference.   Imaging Studies: CT ABDOMEN PELVIS WO CONTRAST Result Date: 04/27/2024 CLINICAL DATA:  Flank pain EXAM: CT ABDOMEN AND PELVIS WITHOUT CONTRAST TECHNIQUE: Multidetector CT imaging of the abdomen and pelvis was performed following the standard protocol without IV contrast. RADIATION DOSE REDUCTION: This exam was performed according to the departmental dose-optimization program which includes automated exposure control, adjustment of the mA and/or kV according to patient size and/or use of iterative reconstruction technique. COMPARISON:  09/28/2021 FINDINGS: Lower chest: No acute abnormality. Hepatobiliary: Gallbladder has been  surgically removed. Pneumobilia is seen. Pancreas: Unremarkable. No pancreatic ductal dilatation or surrounding inflammatory changes. Spleen: Normal in size without focal abnormality. Adrenals/Urinary Tract: Adrenal glands are within normal limits. Kidneys are well visualized bilaterally. Punctate nonobstructing right renal stone is seen. No obstructive changes are noted. The bladder is decompressed by suprapubic catheter. Stomach/Bowel: Scattered diverticular change of the colon is noted. No diverticulitis is seen. The appendix is within normal limits. Small bowel and stomach are unremarkable. Vascular/Lymphatic: Aortic atherosclerosis. No enlarged abdominal or pelvic lymph nodes. Reproductive: Prostate is enlarged in size. Other: Fat containing left inguinal hernia is noted. Musculoskeletal: Degenerative changes of lumbar spine are seen. IMPRESSION: Punctate nonobstructing right renal stone.  Diverticulosis without diverticulitis. Electronically Signed   By: Oneil Devonshire M.D.   On: 04/27/2024 03:36   CT HEAD WO CONTRAST ( ) Result Date: 04/27/2024 CLINICAL DATA:  Head trauma, moderate-severe EXAM: CT HEAD WITHOUT CONTRAST TECHNIQUE: Contiguous axial images were obtained from the base of the skull through the vertex without intravenous contrast. RADIATION DOSE REDUCTION: This exam was performed according to the departmental dose-optimization program which includes automated exposure control, adjustment of the mA and/or kV according to patient size and/or use of iterative reconstruction technique. COMPARISON:  None Available. FINDINGS: Brain: No evidence of acute infarction, hemorrhage, hydrocephalus, extra-axial collection or mass lesion/mass effect. Patchy white matter hypodensities, compatible with chronic microvascular ischemic change. Vascular: No hyperdense vessel or unexpected calcification. Skull: No acute fracture. Sinuses/Orbits: Clear sinuses.  No acute orbital findings. Other: No mastoid effusions. IMPRESSION: No evidence of acute intracranial abnormality. Electronically Signed   By: Gilmore GORMAN Molt M.D.   On: 04/27/2024 01:28    Microbiology: Results for orders placed or performed during the hospital encounter of 04/26/24  Culture, Urine (Do not remove urinary catheter, catheter placed by urology or difficult to place)     Status: Abnormal   Collection Time: 04/27/24  1:01 AM   Specimen: Urine, Catheterized  Result Value Ref Range Status   Specimen Description   Final    URINE, CATHETERIZED Performed at [facility], [address], [place], [place] [address]    Special Requests   Final    NONE Performed at [facility], [address], [place], [place] [address]    Culture 40,000 COLONIES/mL PSEUDOMONAS AERUGINOSA (A)  Final   Report Status 05/01/2024 FINAL  Final   Organism ID, Bacteria PSEUDOMONAS AERUGINOSA (A)  Final       Susceptibility   Pseudomonas aeruginosa - MIC*    CEFTAZIDIME <=1 SENSITIVE Sensitive     CIPROFLOXACIN  2 RESISTANT Resistant     GENTAMICIN <=1 SENSITIVE Sensitive     IMIPENEM >=16 RESISTANT Resistant     PIP/TAZO <=4 SENSITIVE Sensitive ug/mL    CEFEPIME  0.5 SENSITIVE Sensitive     * 40,000 COLONIES/mL PSEUDOMONAS AERUGINOSA    Labs: CBC: Recent Labs  Lab 04/27/24 0101 04/28/24 0533 04/30/24 0540  WBC 9.9 6.2 6.7  HGB 12.3* 11.0* 11.7*  HCT 38.2* 35.2* 35.6*  MCV 89.5 91.4 89.0  PLT 246 200 216   Basic Metabolic Panel: Recent Labs  Lab 04/27/24 0101 04/27/24 1747 04/28/24 0533 04/29/24 0553 04/30/24 0540  NA 133* 134* 136 134* 136  K 5.3* 5.0 4.6 4.6 4.4  CL 103 108 113* 107 107  CO2 22 21* 19* 20* 22  GLUCOSE 111* 126* 90 91 104*  BUN 35* 25* 22 20 19   CREATININE 2.14* 1.78* 1.58* 1.33* 1.27*  CALCIUM 9.5 9.0 8.7* 8.6*  9.0  MG  --   --   --  1.8  --    Liver Function Tests: Recent Labs  Lab 04/27/24 0101 04/28/24 0533  AST 20 17  ALT 14 13  ALKPHOS 26* 20*  BILITOT 0.7 0.5  PROT 7.0 6.1*  ALBUMIN 3.8 3.3*   CBG: Recent Labs  Lab 04/30/24 0744 04/30/24 1148 04/30/24 2052 05/01/24 0743 05/02/24 0522  GLUCAP 104* 110* 120* 115* 119*    Discharge time spent: 35 minutes.  Signed: Elgin Lam, MD [facility] 05/03/2024

## 2024-05-03 NOTE — Plan of Care (Signed)

## 2024-05-04 ENCOUNTER — Telehealth: Payer: Self-pay | Admitting: *Deleted

## 2024-05-04 NOTE — Transitions of Care (Post Inpatient/ED Visit) (Signed)
 05/04/2024  Name: Neil [name] MRN: [id number] DOB: April 07, 1941  Today's TOC FU Call Status: Today's TOC FU Call Status:: Successful TOC FU Call Completed TOC FU Call Complete Date: 05/04/24 Patient's Name and Date of Birth confirmed.  Transition Care Management Follow-up Telephone Call Date of Discharge: 05/03/24 Discharge Facility: [facility]) Type of Discharge: Inpatient Admission Primary Inpatient Discharge Diagnosis:: Acute renal failure superimposed on stage 3a chronic kidney disease How have you been since you were released from the hospital?: Better Any questions or concerns?: No  Items Reviewed: Did you receive and understand the discharge instructions provided?: Yes Medications obtained,verified, and reconciled?: Yes (Medications Reviewed) Any new allergies since your discharge?: No Dietary orders reviewed?: Yes Type of Diet Ordered:: heart healthy Do you have support at home?: Yes People in Home [RPT]: alone Name of Support/Comfort Primary Source: Step daughter lives less than 10 miles away  Medications Reviewed Today: Medications Reviewed Today     Reviewed by Lucky Andrea LABOR, RN (Registered Nurse) on 05/04/24 at 1113  Med List Status: <None>   Medication Order Taking? Sig Documenting Provider Last Dose Status Informant  acetaminophen  (TYLENOL ) 500 MG tablet [id number] Yes Take 1,500 mg by mouth daily as needed for mild pain. [provider]  Active Self, Pharmacy Records  Alpha-D-Galactosidase [facility] PO) [id number] Yes Take 1 tablet by mouth at bedtime. [provider]  Active Self, Pharmacy Records  aspirin  81 MG EC tablet [id number] Yes Take 81 mg by mouth daily. [provider]  Active Self, Pharmacy Records  benazepril  (LOTENSIN ) 40 MG tablet [id number] Yes Take 40 mg by mouth daily. [provider]  Active   Cholecalciferol (VITAMIN D ) 50 MCG (2000 UT) CAPS [id number] Yes Take 2,000 Units by mouth at bedtime. [provider]  Active Self, Pharmacy Records  citalopram  (CELEXA ) 10 MG tablet [id number] Yes TAKE 1 TABLET BY MOUTH DAILY Norleen Lynwood ORN, MD  Active Self, Pharmacy Records  cyclobenzaprine  (FLEXERIL ) 5 MG tablet [id number] Yes TAKE 1 TABLET BY MOUTH 3 TIMES  DAILY AS NEEDED FOR MUSCLE  SPASM(S) Norleen Lynwood ORN, MD  Active Self, Pharmacy Records  docusate sodium  (COLACE) 100 MG capsule [id number] Yes Take 100 mg by mouth daily as needed for mild constipation or moderate constipation. [provider]  Active Self, Pharmacy Records  ezetimibe  (ZETIA ) 10 MG tablet [id number] Yes TAKE 1 TABLET BY MOUTH DAILY Norleen Lynwood ORN, MD  Active Self, Pharmacy Records  fenofibrate  160 MG tablet [id number] Yes TAKE 1 TABLET BY MOUTH AT  BEDTIME Norleen Lynwood ORN, MD  Active Self, Pharmacy Records  finasteride  (PROSCAR ) 5 MG tablet [id number] Yes TAKE 1 TABLET BY MOUTH DAILY Norleen Lynwood ORN, MD  Active Self, Pharmacy Records  ibuprofen (ADVIL) 200 MG tablet [id number] Yes Take 600 mg by mouth 2 (two) times daily as needed for moderate pain. [provider]  Active Self, Pharmacy Records  methenamine  (HIPREX ) 1 g tablet [id number] Yes Take 1 tablet (1 g total) by mouth 2 (two) times daily. Norleen Lynwood ORN, MD  Active Self, Pharmacy Records  metoprolol  succinate (TOPROL -XL) 50 MG 24 hr tablet [id number]  TAKE 1 TABLET BY MOUTH DAILY  WITH OR IMMEDIATELY FOLLOWING A  MEAL  Patient not taking: Reported on 05/04/2024   Norleen Lynwood ORN, MD  Active Self, Pharmacy Records  Multiple Vitamin (MULTIVITAMIN WITH MINERALS) TABS tablet [id number] Yes Take 1 tablet by mouth daily. Earley Saucer, MD  Active Self, Pharmacy Records  MYRBETRIQ 25 MG TB24 tablet  [id number]  Take 25 mg by mouth daily.  Patient not taking: Reported on 05/04/2024   [provider]  Active Self, Pharmacy Records  oxybutynin  (DITROPAN -XL) 10 MG 24 hr tablet [id number] Yes Take 10 mg by mouth daily. [provider]  Active Self, Pharmacy Records   silodosin  (RAPAFLO ) 4 MG CAPS capsule [id number] Yes Take 1 capsule (4 mg total) by mouth daily with breakfast. Norleen Lynwood ORN, MD  Active Self, Pharmacy Records  sodium chloride  (OCEAN) 0.65 % nasal spray [id number] Yes Place 1 spray into the nose daily as needed for congestion.  [provider]  Active Self, Pharmacy Records  terazosin  (HYTRIN ) 2 MG capsule [id number] Yes Take 1 capsule (2 mg total) by mouth at bedtime. Norleen Lynwood ORN, MD  Active Self, Pharmacy Records  traZODone  (DESYREL ) 50 MG tablet [id number] Yes TAKE 1/2 TO 1 TABLET BY MOUTH AT BEDTIME AS NEEDED FOR SLEEP Norleen Lynwood ORN, MD  Active Self, Pharmacy Records            Home Care and Equipment/Supplies: Were Home Health Services Ordered?: No Any new equipment or medical supplies ordered?: No  Functional Questionnaire: Do you need assistance with bathing/showering or dressing?: No Do you need assistance with meal preparation?: No Do you need assistance with eating?: No Do you have difficulty maintaining continence: No Do you need assistance with getting out of bed/getting out of a chair/moving?: No Do you have difficulty managing or taking your medications?: No  Follow up appointments reviewed: PCP Follow-up appointment confirmed?: No (Patient will call PCP office to schedule a hospital follow up visit) MD Provider Line [phone] Given: No Specialist Hospital Follow-up appointment confirmed?: NA Do you need transportation to your follow-up appointment?: No Do you understand care options if your condition(s) worsen?: Yes-patient verbalized understanding  SDOH Interventions Today    Flowsheet Row Most Recent Value  SDOH Interventions   Food Insecurity Interventions Intervention Not Indicated  Housing Interventions Intervention Not Indicated  Transportation Interventions Intervention Not Indicated  Utilities Interventions Intervention Not Indicated    Andrea Dimes RN, BSN   Value-Based  [facility] RN Care Manager [phone]

## 2024-05-07 ENCOUNTER — Encounter: Payer: Self-pay | Admitting: Internal Medicine

## 2024-05-07 ENCOUNTER — Ambulatory Visit: Admitting: Internal Medicine

## 2024-05-07 VITALS — BP 158/86 | HR 68 | Ht 68.0 in | Wt 157.2 lb

## 2024-05-07 DIAGNOSIS — E119 Type 2 diabetes mellitus without complications: Secondary | ICD-10-CM

## 2024-05-07 DIAGNOSIS — N179 Acute kidney failure, unspecified: Secondary | ICD-10-CM

## 2024-05-07 DIAGNOSIS — N390 Urinary tract infection, site not specified: Secondary | ICD-10-CM

## 2024-05-07 DIAGNOSIS — I10 Essential (primary) hypertension: Secondary | ICD-10-CM

## 2024-05-07 MED ORDER — METHENAMINE HIPPURATE 1 G PO TABS: 1.0000 g | ORAL_TABLET | Freq: Two times a day (BID) | ORAL | 1 refills | Status: DC

## 2024-05-07 MED ORDER — METOPROLOL SUCCINATE ER 50 MG PO TB24: 50.0000 mg | ORAL_TABLET | Freq: Every day | ORAL | 3 refills | Status: DC

## 2024-05-07 NOTE — Patient Instructions (Signed)
 Ok to restart the metoprolol   Please continue all other medications as before, and refills have been done for the methenamine   Please have the pharmacy call with any other refills you may need.  Please continue your efforts at being more active, low cholesterol diet, and weight control.  Please keep your appointments with your specialists as you may have planned  Please make an Appointment to return in 6 months, or sooner if needed

## 2024-05-07 NOTE — Assessment & Plan Note (Signed)
 Lab Results  Component Value Date   HGBA1C 7.0 (H) 01/09/2024   Stable, pt to continue current medical treatment  - diet, wt control

## 2024-05-07 NOTE — Assessment & Plan Note (Signed)
 clinically resolved, stable,  to f/u any worsening symptoms or concerns

## 2024-05-07 NOTE — Assessment & Plan Note (Signed)
 Improved to cr 1.27 at d/c - declines f//u lab today

## 2024-05-07 NOTE — Assessment & Plan Note (Signed)
 BP Readings from Last 3 Encounters:  05/07/24 (!) 158/86  05/03/24 (!) 154/70  01/14/24 130/62   uncontrolled, pt to continue medical treatment lotensin  40 every day, and restart toprol  xl 50 qd

## 2024-05-07 NOTE — Progress Notes (Signed)
 Patient ID: [place] JONELLE Ann II, male   DOB: 1941/04/19, 83 y.o.   MRN: [id number]        Chief Complaint: follow up post hospn July 13- 20 with UTI and dehydration AKI after septra not effective apparently       HPI:  HESSTON HITCHENS II is a 83 y.o. male here with c/o above, finished 7 days cefipime for pseudomonas uti resistent to cipro  and apparently septra as well.  Stamina improving, No falls, and PT not needed at this time.  Using walker Pt denies chest pain, increased sob or doe, wheezing, orthopnea, PND, increased LE swelling, palpitations, dizziness or syncope.   Pt denies polydipsia, polyuria, or new focal neuro s/s.    Pt denies fever, night sweats, loss of appetite, or other constitutional symptoms  Remains with suprapubic catheter.  Has HH in place.         Wt Readings from Last 3 Encounters:  05/07/24 157 lb 3.2 oz (71.3 kg)  05/03/24 154 lb 12.2 oz (70.2 kg)  01/14/24 162 lb (73.5 kg)   BP Readings from Last 3 Encounters:  05/07/24 (!) 158/86  05/03/24 (!) 154/70  01/14/24 130/62         Past Medical History:  Diagnosis Date   ALLERGIC RHINITIS 12/04/2007   Anxiety    BENIGN PROSTATIC HYPERTROPHY 06/05/2007   BUNDLE BRANCH BLOCK, RIGHT 07/21/2009   BURSITIS, RIGHT SHOULDER 11/06/2010   Choledocholithiasis    CKD (chronic kidney disease) stage 3, GFR 30-59 ml/min (HCC) 08/02/2017   COLONIC POLYPS, HX OF 06/05/2007   tubular adenoma also 02/19/2013   Coronary artery calcification seen on CT scan 03/07/2016   Depression    DIABETES MELLITUS, TYPE II 12/04/2007   DIVERTICULOSIS, COLON 07/21/2009   ERECTILE DYSFUNCTION 06/05/2007   Gallstones    HYPERLIPIDEMIA 06/05/2007   on meds   HYPERTENSION 06/05/2007   on meds   Hypotension    INGUINAL HERNIA, RIGHT, SMALL 11/06/2010   Liver abscess 05/23/2015   SKIN LESION 06/03/2008   Past Surgical History:  Procedure Laterality Date   BIOPSY  09/08/2021   Procedure: BIOPSY;  Surgeon: Eda Iha, MD;  Location: [facility];  Service: Endoscopy;;   CHOLECYSTECTOMY N/A 03/02/2015   Procedure: LAPAROSCOPIC CHOLECYSTECTOMY;  Surgeon: Elon Pacini, MD;  Location: [facility];  Service: General;  Laterality: N/A;   COLONOSCOPY  2017   JP-MAC-suprep (good)-TA   CYSTOSCOPY N/A 05/29/2022   Procedure: PHYLLIS;  Surgeon: Rosalind Zachary NOVAK, MD;  Location: [facility];  Service: Urology;  Laterality: N/A;   ERCP N/A 03/01/2015   Procedure: ENDOSCOPIC RETROGRADE CHOLANGIOPANCREATOGRAPHY (ERCP);  Surgeon: Norleen LOISE Kiang, MD;  Location: [facility];  Service: Endoscopy;  Laterality: N/A;  Dr Pacini wants patient to stay overnight after ERCP   ERCP N/A 05/03/2015   Procedure: ENDOSCOPIC RETROGRADE CHOLANGIOPANCREATOGRAPHY (ERCP);  Surgeon: Norleen LOISE Kiang, MD;  Location: [facility];  Service: Endoscopy;  Laterality: N/A;   FINGER SURGERY Left    little finger   FLEXIBLE SIGMOIDOSCOPY N/A 09/08/2021   Procedure: FLEXIBLE SIGMOIDOSCOPY;  Surgeon: Eda Iha, MD;  Location: [facility];  Service: Endoscopy;  Laterality: N/A;   HEMORRHOID SURGERY  1973   INGUINAL HERNIA REPAIR Right 2014   8'14 repair   INSERTION OF SUPRAPUBIC CATHETER N/A 05/29/2022   Procedure: INSERTION OF SUPRAPUBIC CATHETER;  Surgeon: Rosalind Zachary NOVAK, MD;  Location: [facility];  Service: Urology;  Laterality: N/A;  30 MINS   POLYPECTOMY     TONSILLECTOMY  WISDOM TOOTH EXTRACTION  1972    reports that he quit smoking about 17 years ago. His smoking use included cigarettes. He has never used smokeless tobacco. He reports that he does not currently use alcohol. He reports that he does not use drugs. family history includes Brain cancer (age of onset: 4) in his mother; Colon cancer in his paternal uncle; Colon cancer (age of onset: 83) in his mother; Colon polyps in his paternal uncle; Colon polyps (age of onset: 31) in his mother. Allergies  Allergen Reactions   Statins Other (See Comments)    Joint pain   Zanaflex  [Tizanidine ] Other (See  Comments)    Dizzy, sleepy   Current Outpatient Medications on File Prior to Visit  Medication Sig Dispense Refill   acetaminophen  (TYLENOL ) 500 MG tablet Take 1,500 mg by mouth daily as needed for mild pain.     Alpha-D-Galactosidase (BEANO PO) Take 1 tablet by mouth at bedtime.     aspirin  81 MG EC tablet Take 81 mg by mouth daily.     benazepril  (LOTENSIN ) 40 MG tablet Take 40 mg by mouth daily.     Cholecalciferol (VITAMIN D ) 50 MCG (2000 UT) CAPS Take 2,000 Units by mouth at bedtime.     citalopram  (CELEXA ) 10 MG tablet TAKE 1 TABLET BY MOUTH DAILY 90 tablet 3   cyclobenzaprine  (FLEXERIL ) 5 MG tablet TAKE 1 TABLET BY MOUTH 3 TIMES  DAILY AS NEEDED FOR MUSCLE  SPASM(S) 270 tablet 1   docusate sodium  (COLACE) 100 MG capsule Take 100 mg by mouth daily as needed for mild constipation or moderate constipation.     ezetimibe  (ZETIA ) 10 MG tablet TAKE 1 TABLET BY MOUTH DAILY 90 tablet 3   fenofibrate  160 MG tablet TAKE 1 TABLET BY MOUTH AT  BEDTIME 100 tablet 2   finasteride  (PROSCAR ) 5 MG tablet TAKE 1 TABLET BY MOUTH DAILY 100 tablet 2   ibuprofen (ADVIL) 200 MG tablet Take 600 mg by mouth 2 (two) times daily as needed for moderate pain.     Multiple Vitamin (MULTIVITAMIN WITH MINERALS) TABS tablet Take 1 tablet by mouth daily. 30 tablet 0   oxybutynin  (DITROPAN -XL) 10 MG 24 hr tablet Take 10 mg by mouth daily.     silodosin  (RAPAFLO ) 4 MG CAPS capsule Take 1 capsule (4 mg total) by mouth daily with breakfast. 90 capsule 1   sodium chloride  (OCEAN) 0.65 % nasal spray Place 1 spray into the nose daily as needed for congestion.      terazosin  (HYTRIN ) 2 MG capsule Take 1 capsule (2 mg total) by mouth at bedtime. 90 capsule 1   traZODone  (DESYREL ) 50 MG tablet TAKE 1/2 TO 1 TABLET BY MOUTH AT BEDTIME AS NEEDED FOR SLEEP 90 tablet 3   MYRBETRIQ 25 MG TB24 tablet Take 25 mg by mouth daily. (Patient not taking: Reported on 05/07/2024)     No current facility-administered medications on file prior  to visit.        ROS:  All others reviewed and negative.  Objective        PE:  BP (!) 158/86   Pulse 68   Ht 5' 8 (1.727 m)   Wt 157 lb 3.2 oz (71.3 kg)   SpO2 99%   BMI 23.90 kg/m                 Constitutional: Pt appears in NAD               HENT: Head:  NCAT.                Right Ear: External ear normal.                 Left Ear: External ear normal.                Eyes: . Pupils are equal, round, and reactive to light. Conjunctivae and EOM are normal               Nose: without d/c or deformity               Neck: Neck supple. Gross normal ROM               Cardiovascular: Normal rate and regular rhythm.                 Pulmonary/Chest: Effort normal and breath sounds without rales or wheezing.                Abd:  Soft, NT, ND, + BS, no organomegaly               Neurological: Pt is alert. At baseline orientation, motor grossly intact               Skin: Skin is warm. No rashes, no other new lesions, LE edema - none               Psychiatric: Pt behavior is normal without agitation   Micro: none  Cardiac tracings I have personally interpreted today:  none  Pertinent Radiological findings (summarize): none   Lab Results  Component Value Date   WBC 6.7 04/30/2024   HGB 11.7 (L) 04/30/2024   HCT 35.6 (L) 04/30/2024   PLT 216 04/30/2024   GLUCOSE 104 (H) 04/30/2024   CHOL 159 01/09/2024   TRIG 185.0 (H) 01/09/2024   HDL 35.80 (L) 01/09/2024   LDLDIRECT 106.0 05/18/2021   LDLCALC 86 01/09/2024   ALT 13 04/28/2024   AST 17 04/28/2024   NA 136 04/30/2024   K 4.4 04/30/2024   CL 107 04/30/2024   CREATININE 1.27 (H) 04/30/2024   BUN 19 04/30/2024   CO2 22 04/30/2024   TSH 1.42 01/09/2024   PSA 0.57 01/08/2023   INR 1.25 05/23/2015   HGBA1C 7.0 (H) 01/09/2024   MICROALBUR 73.7 (H) 01/09/2024   Assessment/Plan:  NAZAR KUAN II is a 83 y.o. White or Caucasian [1] male with  has a past medical history of ALLERGIC RHINITIS (12/04/2007), Anxiety, BENIGN  PROSTATIC HYPERTROPHY (06/05/2007), BUNDLE BRANCH BLOCK, RIGHT (07/21/2009), BURSITIS, RIGHT SHOULDER (11/06/2010), Choledocholithiasis, CKD (chronic kidney disease) stage 3, GFR 30-59 ml/min (HCC) (08/02/2017), COLONIC POLYPS, HX OF (06/05/2007), Coronary artery calcification seen on CT scan (03/07/2016), Depression, DIABETES MELLITUS, TYPE II (12/04/2007), DIVERTICULOSIS, COLON (07/21/2009), ERECTILE DYSFUNCTION (06/05/2007), Gallstones, HYPERLIPIDEMIA (06/05/2007), HYPERTENSION (06/05/2007), Hypotension, INGUINAL HERNIA, RIGHT, SMALL (11/06/2010), Liver abscess (05/23/2015), and SKIN LESION (06/03/2008).  Acute UTI (urinary tract infection) clinically resolved, stable,  to f/u any worsening symptoms or concerns  AKI (acute kidney injury) (HCC) Improved to cr 1.27 at d/c - declines f//u lab today  Essential hypertension, benign BP Readings from Last 3 Encounters:  05/07/24 (!) 158/86  05/03/24 (!) 154/70  01/14/24 130/62   uncontrolled, pt to continue medical treatment lotensin  40 every day, and restart toprol  xl 50 qd   Diabetes mellitus type 2 in nonobese [facility]) Lab Results  Component Value Date   HGBA1C 7.0 (H) 01/09/2024   Stable,  pt to continue current medical treatment  - diet, wt control   Followup: Return in about 6 months (around 11/07/2024).  Lynwood Rush, MD 05/07/2024 7:33 PM [facility] [place] [facility] - [facility]

## 2024-05-12 ENCOUNTER — Telehealth: Admitting: *Deleted

## 2024-05-12 ENCOUNTER — Other Ambulatory Visit: Payer: Self-pay | Admitting: *Deleted

## 2024-05-12 NOTE — Transitions of Care (Post Inpatient/ED Visit) (Signed)
 Transition of Care week 2/ day # 8  Visit Note  05/12/2024  Name: Neil [name] MRN: [id number]          DOB: 22-Apr-1941  Situation: Patient enrolled in [facility] 30-day program. Visit completed with patient by telephone.   HIPAA identifiers x 2 verified  Background:  Hospitalized July 13- 20, 2025 for UTI with acute renal failure/ renal stone, mechanical falls at home (1) unplanned hospital admission x last (6) and (12) months  Initial Transition Care Management Follow-up Telephone Call    Past Medical History:  Diagnosis Date   ALLERGIC RHINITIS 12/04/2007   Anxiety    BENIGN PROSTATIC HYPERTROPHY 06/05/2007   BUNDLE BRANCH BLOCK, RIGHT 07/21/2009   BURSITIS, RIGHT SHOULDER 11/06/2010   Choledocholithiasis    CKD (chronic kidney disease) stage 3, GFR 30-59 ml/min (HCC) 08/02/2017   COLONIC POLYPS, HX OF 06/05/2007   tubular adenoma also 02/19/2013   Coronary artery calcification seen on CT scan 03/07/2016   Depression    DIABETES MELLITUS, TYPE II 12/04/2007   DIVERTICULOSIS, COLON 07/21/2009   ERECTILE DYSFUNCTION 06/05/2007   Gallstones    HYPERLIPIDEMIA 06/05/2007   on meds   HYPERTENSION 06/05/2007   on meds   Hypotension    INGUINAL HERNIA, RIGHT, SMALL 11/06/2010   Liver abscess 05/23/2015   SKIN LESION 06/03/2008   Assessment:  I am doing fine, no problems, got a great report from Dr. Norleen last week and re-started the blood pressure medication like he told me to.  Still using the walker for now, just for safety because I don't want any more falls- but I hope to graduate back to the cane soon.  Taking all my medications like I am supposed to, managing my suprapubic catheter independently like I always have    Denies clinical concerns and sounds to be in no distress throughout TOC 30-day program outreach call today  Patient Reported Symptoms: Cognitive Cognitive Status: Normal speech and language skills, Alert and oriented to person, place, and time,  Insightful and able to interpret abstract concepts Cognitive/Intellectual Conditions Management [RPT]: None reported or documented in medical history or problem list      Neurological Neurological Review of Symptoms: No symptoms reported    HEENT HEENT Symptoms Reported: No symptoms reported      Cardiovascular Cardiovascular Symptoms Reported: No symptoms reported Does patient have uncontrolled Hypertension?: No Cardiovascular Management Strategies: Medication therapy, Routine screening, Coping strategies  Respiratory Respiratory Symptoms Reported: No symptoms reported Other Respiratory Symptoms: Denies shortness of breath/ cough; sounds to be in no respiratory distress throughout [facility] call Respiratory Management Strategies: Routine screening  Endocrine Endocrine Symptoms Reported: No symptoms reported Is patient diabetic?: Yes (Reports they said I am pre-diabetic; I am not on any medicine for diabetes) Is patient checking blood sugars at home?: No    Gastrointestinal Gastrointestinal Symptoms Reported: No symptoms reported Additional Gastrointestinal Details: Reports good appetite; reports normal and regular bowel movements; reports last BM last night Gastrointestinal Management Strategies: Coping strategies    Genitourinary Genitourinary Symptoms Reported: No symptoms reported, Other Other Genitourinary Symptoms: Suprapubic catheter at baseline Additional Genitourinary Details: suprapubic catheter at baseline: reports independently self- manages, have had it for 2 years or more; reports has catheter exchanges routinely at [facility] every month; reports last exchange on 04/20/24; confirms next scheduled exchange on 05/18/24; reports urine pale clear yellow urine; Able to independently verbalize signs/ symptoms UTI without prompting, along with corresponding action plan Genitourinary Management Strategies: Coping strategies, Medical device  Integumentary Integumentary  Symptoms Reported: No symptoms reported Additional Integumentary Details: Reports skin around suprapubic catheter is fine, no redness, no drainage Skin Management Strategies: Coping strategies, Routine screening  Musculoskeletal Musculoskelatal Symptoms Reviewed: Limited mobility, Unsteady gait Additional Musculoskeletal Details: confirmed uses assistive devices on regular basis, at baseline -- cane; walker: reports currently using walker more than cane; due to ongoing post-hospital discharge tiredness, just for extra precautions; confirmed no new falls post-hospital discharge;  provided education/ reinforcement around fall prevention Musculoskeletal Management Strategies: Coping strategies, Medical device, Routine screening      Psychosocial Psychosocial Symptoms Reported: No symptoms reported         There were no vitals filed for this visit.  Medications Reviewed Today     Reviewed by Mariaclara Spear M, RN (Registered Nurse) on 05/12/24 at 1501  Med List Status: <None>   Medication Order Taking? Sig Documenting Provider Last Dose Status Informant  acetaminophen  (TYLENOL ) 500 MG tablet [id number]  Take 1,500 mg by mouth daily as needed for mild pain. [provider]  Active Self, Pharmacy Records  Alpha-D-Galactosidase [facility] PO) [id number]  Take 1 tablet by mouth at bedtime. [provider]  Active Self, Pharmacy Records  aspirin  81 MG EC tablet [id number]  Take 81 mg by mouth daily. [provider]  Active Self, Pharmacy Records  benazepril  (LOTENSIN ) 40 MG tablet [id number]  Take 40 mg by mouth daily. [provider]  Active   Cholecalciferol (VITAMIN D ) 50 MCG (2000 UT) CAPS [id number]  Take 2,000 Units by mouth at bedtime. [provider]  Active Self, Pharmacy Records  citalopram  (CELEXA ) 10 MG tablet [id number]  TAKE 1 TABLET BY MOUTH DAILY Norleen Lynwood ORN, MD  Active Self, Pharmacy Records  cyclobenzaprine  (FLEXERIL ) 5 MG tablet [id number]   TAKE 1 TABLET BY MOUTH 3 TIMES  DAILY AS NEEDED FOR MUSCLE  SPASM(S) Norleen Lynwood ORN, MD  Active Self, Pharmacy Records  docusate sodium  (COLACE) 100 MG capsule [id number]  Take 100 mg by mouth daily as needed for mild constipation or moderate constipation. [provider]  Active Self, Pharmacy Records  ezetimibe  (ZETIA ) 10 MG tablet [id number]  TAKE 1 TABLET BY MOUTH DAILY Norleen Lynwood ORN, MD  Active Self, Pharmacy Records  fenofibrate  160 MG tablet [id number]  TAKE 1 TABLET BY MOUTH AT  BEDTIME Norleen Lynwood ORN, MD  Active Self, Pharmacy Records  finasteride  (PROSCAR ) 5 MG tablet [id number]  TAKE 1 TABLET BY MOUTH DAILY Norleen Lynwood ORN, MD  Active Self, Pharmacy Records  ibuprofen (ADVIL) 200 MG tablet [id number]  Take 600 mg by mouth 2 (two) times daily as needed for moderate pain. [provider]  Active Self, Pharmacy Records  methenamine  (HIPREX ) 1 g tablet [id number]  Take 1 tablet (1 g total) by mouth 2 (two) times daily. Norleen Lynwood ORN, MD  Active   metoprolol  succinate (TOPROL -XL) 50 MG 24 hr tablet [id number]  Take 1 tablet (50 mg total) by mouth daily. Take with or immediately following a meal. Norleen Lynwood ORN, MD  Active   Multiple Vitamin (MULTIVITAMIN WITH MINERALS) TABS tablet [id number]  Take 1 tablet by mouth daily. Earley Saucer, MD  Active Self, Pharmacy Records  MYRBETRIQ 25 MG TB24 tablet [id number]  Take 25 mg by mouth daily.  Patient not taking: Reported on 05/07/2024   [provider]  Active Self, Pharmacy Records  oxybutynin  (DITROPAN -XL) 10 MG 24 hr tablet [id number]  Take 10 mg by mouth daily. [provider]  Active Self, Pharmacy Records  silodosin  (RAPAFLO ) 4 MG CAPS capsule [id number]  Take 1 capsule (4 mg total) by mouth daily with breakfast. Norleen Lynwood ORN, MD  Active Self, Pharmacy Records  sodium chloride  (OCEAN) 0.65 % nasal spray [id number]  Place 1 spray into the nose daily as needed for congestion.  [provider]  Active Self, Pharmacy  Records  terazosin  (HYTRIN ) 2 MG capsule [id number]  Take 1 capsule (2 mg total) by mouth at bedtime. Norleen Lynwood ORN, MD  Active Self, Pharmacy Records  traZODone  (DESYREL ) 50 MG tablet [id number]  TAKE 1/2 TO 1 TABLET BY MOUTH AT BEDTIME AS NEEDED FOR SLEEP Norleen Lynwood ORN, MD  Active Self, Pharmacy Records           Recommendation:   Continue Current Plan of Care  Follow Up Plan:   Telephone follow-up in 1 week  Pls call/ message for questions,  Beatris Blinda Lawrence, RN, BSN, CCRN Alumnus RN Care Manager  Transitions of Care  [facility] - [facility] [phone]: direct office

## 2024-05-12 NOTE — Patient Instructions (Signed)
 Visit Information  Thank you for taking time to visit with me today. Please don't hesitate to contact me if I can be of assistance to you before our next scheduled telephone appointment.  Our next appointment is by telephone on Thursday, May 21, 2024 at 2:15 pm  Please call the care guide team at [phone] if you need to cancel or reschedule your appointment.   Following are the goals we discussed today:  Patient Self Care Activities:  Attend all scheduled provider appointments Call provider office for new concerns or questions  Notify RN Care Manager of TOC call rescheduling needs Participate in Transition of Care Program/Attend TOC scheduled calls Take medications as prescribed   Continue pacing activity as your recuperation from your recent hospitalization continues Use assistive devices as needed to prevent falls If you believe your condition is getting worse- contact your care providers (doctors) promptly- reaching out to your doctor early when you have concerns can prevent you from having to go to the hospital  If you are experiencing a Mental Health or Behavioral Health Crisis or need someone to talk to, please  call the Suicide and Crisis Lifeline: 988 call the [place]  National Suicide Prevention Lifeline: (564) 713-6480 or TTY: 727-053-9763 TTY (403)149-3892) to talk to a trained counselor call 1-800-273-TALK (toll free, 24 hour hotline) go to [facility] [address], [place] [phone]) call the [facility] Crisis Line: [phone] call 911   Patient verbalizes understanding of instructions and care plan provided today and agrees to view in MyChart. Active MyChart status and patient understanding of how to access instructions and care plan via MyChart confirmed with patient.     Neil [name] Neil Kale Rondeau, RN, BSN, Media planner  Transitions of Care  [facility] - [facility] [phone]: direct  office

## 2024-05-18 DIAGNOSIS — R338 Other retention of urine: Secondary | ICD-10-CM | POA: Diagnosis not present

## 2024-05-21 ENCOUNTER — Other Ambulatory Visit: Payer: Self-pay | Admitting: *Deleted

## 2024-05-21 NOTE — Patient Instructions (Signed)
 Visit Information  Thank you for taking time to visit with me today. Please don't hesitate to contact me if I can be of assistance to you before our next scheduled telephone appointment.  Our next appointment is by telephone on Thursday, May 28, 2024 at 1:00 pm  Please call the care guide team at [phone] if you need to cancel or reschedule your appointment.   Following are the goals we discussed today:  Patient Self Care Activities:  Attend all scheduled provider appointments Call provider office for new concerns or questions  Notify RN Care Manager of TOC call rescheduling needs Participate in Transition of Care Program/Attend TOC scheduled calls Take medications as prescribed   Continue pacing activity as your recuperation from your recent hospitalization continues Use assistive devices as needed to prevent falls If you believe your condition is getting worse- contact your care providers (doctors) promptly- reaching out to your doctor early when you have concerns can prevent you from having to go to the hospital  If you are experiencing a Mental Health or Behavioral Health Crisis or need someone to talk to, please  call the Suicide and Crisis Lifeline: 988 call the [place]  National Suicide Prevention Lifeline: 660 621 7752 or TTY: 956-211-1982 TTY 435-169-5157) to talk to a trained counselor call 1-800-273-TALK (toll free, 24 hour hotline) go to [facility] [address], [place] [phone]) call the [facility] Crisis Line: [phone] call 911   Patient verbalizes understanding of instructions and care plan provided today and agrees to view in MyChart. Active MyChart status and patient understanding of how to access instructions and care plan via MyChart confirmed with patient.     Tayra Dawe Mckinney Eliyahu Bille, RN, BSN, Media planner  Transitions of Care  [facility] - [facility] [phone]:  direct office

## 2024-05-21 NOTE — Transitions of Care (Post Inpatient/ED Visit) (Signed)
 Transition of Care week 3/ day # 17  Visit Note  05/21/2024  Name: Neil [name] MRN: [id number]          DOB: [birth year]  Situation: Patient enrolled in [facility] 30-day program. Visit completed with patient by telephone.   HIPAA identifiers x 2 verified  Background:  Hospitalized July 13- 20, 2025 for UTI with acute renal failure/ renal stone, mechanical falls at home (1) unplanned hospital admission x last (6) and (12) months  Initial Transition Care Management Follow-up Telephone Call    Past Medical History:  Diagnosis Date   ALLERGIC RHINITIS 12/04/2007   Anxiety    BENIGN PROSTATIC HYPERTROPHY 06/05/2007   BUNDLE BRANCH BLOCK, RIGHT 07/21/2009   BURSITIS, RIGHT SHOULDER 11/06/2010   Choledocholithiasis    CKD (chronic kidney disease) stage 3, GFR 30-59 ml/min (HCC) 08/02/2017   COLONIC POLYPS, HX OF 06/05/2007   tubular adenoma also 02/19/2013   Coronary artery calcification seen on CT scan 03/07/2016   Depression    DIABETES MELLITUS, TYPE II 12/04/2007   DIVERTICULOSIS, COLON 07/21/2009   ERECTILE DYSFUNCTION 06/05/2007   Gallstones    HYPERLIPIDEMIA 06/05/2007   on meds   HYPERTENSION 06/05/2007   on meds   Hypotension    INGUINAL HERNIA, RIGHT, SMALL 11/06/2010   Liver abscess 05/23/2015   SKIN LESION 06/03/2008   Assessment:  I am still doing fine, no problems, no changes.  Still using walker, no falls; still managing all of my care on my own, taking medication like I am supposed to.  Been a quiet week, not much different from last week   Denies clinical concerns and sounds to be in no distress throughout [facility] 30-day program outreach call today  Patient Reported Symptoms: Cognitive Cognitive Status: Normal speech and language skills, Alert and oriented to person, place, and time, Insightful and able to interpret abstract concepts Cognitive/Intellectual Conditions Management [RPT]: None reported or documented in medical history or problem list       Neurological Neurological Review of Symptoms: No symptoms reported    HEENT HEENT Symptoms Reported: No symptoms reported      Cardiovascular Cardiovascular Symptoms Reported: No symptoms reported Does patient have uncontrolled Hypertension?: No Cardiovascular Management Strategies: Medication therapy, Coping strategies, Routine screening  Respiratory Respiratory Symptoms Reported: No symptoms reported Other Respiratory Symptoms: Denies shortness of breath, sounds to be in no distress throughout [facility] call Respiratory Management Strategies: Routine screening  Endocrine Endocrine Symptoms Reported: No symptoms reported    Gastrointestinal Gastrointestinal Symptoms Reported: No symptoms reported      Genitourinary Genitourinary Symptoms Reported: No symptoms reported, Other Other Genitourinary Symptoms: Suprapubic catheter at baseline: confirms continues self- managing without assistance from anyone; reports urine is still nice clear and yellow in good amounts reinforced Remains able to independently verbalize signs/ symptoms UTI along with corresponding action plan without prompting Genitourinary Management Strategies: Medical device, Coping strategies  Integumentary Integumentary Symptoms Reported: No symptoms reported Skin Management Strategies: Routine screening, Coping strategies  Musculoskeletal Musculoskelatal Symptoms Reviewed: Limited mobility, Unsteady gait Additional Musculoskeletal Details: confirmed currently requiring/ using assistive devices for ambulation - walker; confirmed no new/ recent falls since last [facility] outreach Musculoskeletal Management Strategies: Medical device, Coping strategies, Routine screening      Psychosocial           There were no vitals filed for this visit.  Medications Reviewed Today     Reviewed by Mirren Gest M, RN (Registered Nurse) on 05/21/24 at 1418  Med List Status: <None>  Medication Order Taking? Sig Documenting Provider Last  Dose Status Informant  acetaminophen  (TYLENOL ) 500 MG tablet [id number]  Take 1,500 mg by mouth daily as needed for mild pain. [provider]  Active Self, Pharmacy Records  Alpha-D-Galactosidase [facility] PO) [id number]  Take 1 tablet by mouth at bedtime. [provider]  Active Self, Pharmacy Records  aspirin  81 MG EC tablet [id number]  Take 81 mg by mouth daily. [provider]  Active Self, Pharmacy Records  benazepril  (LOTENSIN ) 40 MG tablet [id number]  Take 40 mg by mouth daily. [provider]  Active   Cholecalciferol (VITAMIN D ) 50 MCG (2000 UT) CAPS [id number]  Take 2,000 Units by mouth at bedtime. [provider]  Active Self, Pharmacy Records  citalopram  (CELEXA ) 10 MG tablet [id number]  TAKE 1 TABLET BY MOUTH DAILY Norleen Lynwood ORN, MD  Active Self, Pharmacy Records  cyclobenzaprine  (FLEXERIL ) 5 MG tablet [id number]  TAKE 1 TABLET BY MOUTH 3 TIMES  DAILY AS NEEDED FOR MUSCLE  SPASM(S) Norleen Lynwood ORN, MD  Active Self, Pharmacy Records  docusate sodium  (COLACE) 100 MG capsule [id number]  Take 100 mg by mouth daily as needed for mild constipation or moderate constipation. [provider]  Active Self, Pharmacy Records  ezetimibe  (ZETIA ) 10 MG tablet [id number]  TAKE 1 TABLET BY MOUTH DAILY Norleen Lynwood ORN, MD  Active Self, Pharmacy Records  fenofibrate  160 MG tablet [id number]  TAKE 1 TABLET BY MOUTH AT  BEDTIME Norleen Lynwood ORN, MD  Active Self, Pharmacy Records  finasteride  (PROSCAR ) 5 MG tablet [id number]  TAKE 1 TABLET BY MOUTH DAILY Norleen Lynwood ORN, MD  Active Self, Pharmacy Records  ibuprofen (ADVIL) 200 MG tablet [id number]  Take 600 mg by mouth 2 (two) times daily as needed for moderate pain. [provider]  Active Self, Pharmacy Records  methenamine  (HIPREX ) 1 g tablet [id number]  Take 1 tablet (1 g total) by mouth 2 (two) times daily. Norleen Lynwood ORN, MD  Active   metoprolol  succinate (TOPROL -XL) 50 MG 24 hr tablet [id number]  Take 1 tablet (50 mg  total) by mouth daily. Take with or immediately following a meal. Norleen Lynwood ORN, MD  Active   Multiple Vitamin (MULTIVITAMIN WITH MINERALS) TABS tablet [id number]  Take 1 tablet by mouth daily. Earley Saucer, MD  Active Self, Pharmacy Records  MYRBETRIQ 25 MG TB24 tablet [id number]  Take 25 mg by mouth daily.  Patient not taking: Reported on 05/07/2024   [provider]  Active Self, Pharmacy Records  oxybutynin  (DITROPAN -XL) 10 MG 24 hr tablet [id number]  Take 10 mg by mouth daily. [provider]  Active Self, Pharmacy Records  silodosin  (RAPAFLO ) 4 MG CAPS capsule [id number]  Take 1 capsule (4 mg total) by mouth daily with breakfast. Norleen Lynwood ORN, MD  Active Self, Pharmacy Records  sodium chloride  (OCEAN) 0.65 % nasal spray [id number]  Place 1 spray into the nose daily as needed for congestion.  [provider]  Active Self, Pharmacy Records  terazosin  (HYTRIN ) 2 MG capsule [id number]  Take 1 capsule (2 mg total) by mouth at bedtime. Norleen Lynwood ORN, MD  Active Self, Pharmacy Records  traZODone  (DESYREL ) 50 MG tablet [id number]  TAKE 1/2 TO 1 TABLET BY MOUTH AT BEDTIME AS NEEDED FOR SLEEP Norleen Lynwood ORN, MD  Active Self, Pharmacy Records           Recommendation:   Continue Current Plan of Care  Follow Up Plan:   Telephone follow-up in 1 week- as  scheduled 05/28/24  Pls call/ message for questions,  Abdel Effinger Mckinney Claryssa Sandner, RN, BSN, CCRN Alumnus RN Care Manager  Transitions of Care  [facility]I - [facility] [phone]: direct office

## 2024-05-24 ENCOUNTER — Encounter: Payer: Self-pay | Admitting: Internal Medicine

## 2024-05-26 ENCOUNTER — Other Ambulatory Visit: Payer: Self-pay | Admitting: Internal Medicine

## 2024-05-27 ENCOUNTER — Other Ambulatory Visit: Payer: Self-pay | Admitting: Internal Medicine

## 2024-05-28 ENCOUNTER — Other Ambulatory Visit: Payer: Self-pay | Admitting: *Deleted

## 2024-05-28 NOTE — Transitions of Care (Post Inpatient/ED Visit) (Signed)
 Transition of Care week 4/ day # 24  Visit Note  05/28/2024  Name: Neil [name] MRN: [id number]          DOB: [birth year]  Situation: Patient enrolled in [facility] 30-day program. Visit completed with patient by telephone.   HIPAA identifiers x 2 verified  Background:  Hospitalized July 13- 20, 2025 for UTI with acute renal failure/ renal stone, mechanical falls at home (1) unplanned hospital admission x last (6) and (12) months  Initial Transition Care Management Follow-up Telephone Call    Past Medical History:  Diagnosis Date   ALLERGIC RHINITIS 12/04/2007   Anxiety    BENIGN PROSTATIC HYPERTROPHY 06/05/2007   BUNDLE BRANCH BLOCK, RIGHT 07/21/2009   BURSITIS, RIGHT SHOULDER 11/06/2010   Choledocholithiasis    CKD (chronic kidney disease) stage 3, GFR 30-59 ml/min (HCC) 08/02/2017   COLONIC POLYPS, HX OF 06/05/2007   tubular adenoma also 02/19/2013   Coronary artery calcification seen on CT scan 03/07/2016   Depression    DIABETES MELLITUS, TYPE II 12/04/2007   DIVERTICULOSIS, COLON 07/21/2009   ERECTILE DYSFUNCTION 06/05/2007   Gallstones    HYPERLIPIDEMIA 06/05/2007   on meds   HYPERTENSION 06/05/2007   on meds   Hypotension    INGUINAL HERNIA, RIGHT, SMALL 11/06/2010   Liver abscess 05/23/2015   SKIN LESION 06/03/2008   Assessment:  I am still doing fine, not having any problems, no changes to medicines or in my condition.  Still using walker but have started using the cane more around the house- using walker now mainly when I leave the house, no falls; still managing all of my care on my own; I am going to take your advice and call the insurance plan to find out exactly what my benefits are in my dual plan- I need to understand the benefits I have better   Denies clinical concerns and sounds to be in no distress throughout [facility] 30-day program outreach call today  Patient Reported Symptoms: Cognitive Cognitive Status: Normal speech and language skills, Alert and  oriented to person, place, and time, Insightful and able to interpret abstract concepts Cognitive/Intellectual Conditions Management [RPT]: None reported or documented in medical history or problem list      Neurological Neurological Review of Symptoms: No symptoms reported    HEENT HEENT Symptoms Reported: No symptoms reported      Cardiovascular Cardiovascular Symptoms Reported: No symptoms reported Does patient have uncontrolled Hypertension?: No (does not monitor blood pressures at home) Cardiovascular Management Strategies: Medication therapy, Routine screening, Coping strategies  Respiratory Respiratory Symptoms Reported: No symptoms reported Other Respiratory Symptoms: Denies shortness of breath/ cough; sounds to be in no distress throughout [facility] call Respiratory Management Strategies: Routine screening  Endocrine Endocrine Symptoms Reported: No symptoms reported Is patient diabetic?: No Is patient checking blood sugars at home?: No    Gastrointestinal Gastrointestinal Symptoms Reported: No symptoms reported Additional Gastrointestinal Details: Reports good appetite; normal and regular brown formed stools; reports last BM last night Gastrointestinal Management Strategies: Coping strategies    Genitourinary Genitourinary Symptoms Reported: No symptoms reported Other Genitourinary Symptoms: Suprapubic catheter at baseline- confirms continues to manage independently; reports last catheter exchange on 05/18/24: reports went fine; reports urine remains clear yellow in color; reports next exchange to occur in early September and is already scheduled Remains able to independently verbalize signs/ symptoms UTI along with corresponding action plan without prompting    Integumentary Integumentary Symptoms Reported: No symptoms reported Additional Integumentary Details: Continues to report skin  around suprapubic catheter looks fine- no redness, no oozing, no swelling; I clean the site  like I always have, once or twice a week, and of course I look at it every single day, in case it needs attention; Patient continues to verbalize excellent understanding of skin care at catheter site without prompting Skin Management Strategies: Routine screening, Coping strategies  Musculoskeletal Musculoskelatal Symptoms Reviewed: Limited mobility Additional Musculoskeletal Details: confirmed uses assistive devices on regular basis, at baseline -- cane mostly now confirms also uses walker as-if indicated, but mainly only when I go outside of my home  Confirmed no new/ recent falls; provided education/ reinforcement around fall prevention Musculoskeletal Management Strategies: Routine screening, Coping strategies, Medical device Falls in the past year?: Yes Number of falls in past year: 2 or more Was there an injury with Fall?: No Fall Risk Category Calculator: 2 Patient Fall Risk Level: Moderate Fall Risk Patient at Risk for Falls Due to: History of fall(s), Impaired mobility Fall risk Follow up: Falls prevention discussed, Education provided  Psychosocial Psychosocial Symptoms Reported: No symptoms reported Behavioral Management Strategies: Support system Major Change/Loss/Stressor/Fears (CP): Denies Techniques to Cope with Loss/Stress/Change: Diversional activities Quality of Family Relationships: supportive, involved, helpful Do you feel physically threatened by others?: No   There were no vitals filed for this visit.  Medications Reviewed Today     Reviewed by Tomy Khim M, RN (Registered Nurse) on 05/28/24 at 1301  Med List Status: <None>   Medication Order Taking? Sig Documenting Provider Last Dose Status Informant  acetaminophen  (TYLENOL ) 500 MG tablet [id number]  Take 1,500 mg by mouth daily as needed for mild pain. [provider]  Active Self, Pharmacy Records  Alpha-D-Galactosidase [facility] PO) [id number]  Take 1 tablet by mouth at bedtime. [provider]   Active Self, Pharmacy Records  aspirin  81 MG EC tablet [id number]  Take 81 mg by mouth daily. [provider]  Active Self, Pharmacy Records  benazepril  (LOTENSIN ) 40 MG tablet [id number]  Take 40 mg by mouth daily. [provider]  Active   Cholecalciferol (VITAMIN D ) 50 MCG (2000 UT) CAPS [id number]  Take 2,000 Units by mouth at bedtime. [provider]  Active Self, Pharmacy Records  citalopram  (CELEXA ) 10 MG tablet [id number]  TAKE 1 TABLET BY MOUTH DAILY Norleen Lynwood ORN, MD  Active Self, Pharmacy Records  cyclobenzaprine  (FLEXERIL ) 5 MG tablet [id number]  TAKE 1 TABLET BY MOUTH 3 TIMES  DAILY AS NEEDED FOR MUSCLE  SPASM(S) Norleen Lynwood ORN, MD  Active Self, Pharmacy Records  docusate sodium  (COLACE) 100 MG capsule [id number]  Take 100 mg by mouth daily as needed for mild constipation or moderate constipation. [provider]  Active Self, Pharmacy Records  ezetimibe  (ZETIA ) 10 MG tablet [id number]  TAKE 1 TABLET BY MOUTH DAILY Norleen Lynwood ORN, MD  Active Self, Pharmacy Records  fenofibrate  160 MG tablet [id number]  TAKE 1 TABLET BY MOUTH AT  BEDTIME Norleen Lynwood ORN, MD  Active Self, Pharmacy Records  finasteride  (PROSCAR ) 5 MG tablet [id number]  TAKE 1 TABLET BY MOUTH DAILY Norleen Lynwood ORN, MD  Active Self, Pharmacy Records  ibuprofen (ADVIL) 200 MG tablet [id number]  Take 600 mg by mouth 2 (two) times daily as needed for moderate pain. [provider]  Active Self, Pharmacy Records  methenamine  (HIPREX ) 1 g tablet [id number]  Take 1 tablet (1 g total) by mouth 2 (two) times daily. Norleen Lynwood ORN, MD  Active   metoprolol  succinate (TOPROL -XL) 50 MG 24 hr  tablet [id number]  Take 1 tablet (50 mg total) by mouth daily. Take with or immediately following a meal. Norleen Lynwood ORN, MD  Active   Multiple Vitamin (MULTIVITAMIN WITH MINERALS) TABS tablet [id number]  Take 1 tablet by mouth daily. Earley Saucer, MD  Active Self, Pharmacy Records  MYRBETRIQ 25 MG TB24 tablet [id number]  Take  25 mg by mouth daily.  Patient not taking: Reported on 05/07/2024   [provider]  Active Self, Pharmacy Records  oxybutynin  (DITROPAN -XL) 10 MG 24 hr tablet [id number]  Take 10 mg by mouth daily. [provider]  Active Self, Pharmacy Records  silodosin  (RAPAFLO ) 4 MG CAPS capsule [id number]  Take 1 capsule (4 mg total) by mouth daily with breakfast. Norleen Lynwood ORN, MD  Active Self, Pharmacy Records  sodium chloride  (OCEAN) 0.65 % nasal spray [id number]  Place 1 spray into the nose daily as needed for congestion.  [provider]  Active Self, Pharmacy Records  terazosin  (HYTRIN ) 2 MG capsule [id number]  Take 1 capsule (2 mg total) by mouth at bedtime. Norleen Lynwood ORN, MD  Active Self, Pharmacy Records  traZODone  (DESYREL ) 50 MG tablet [id number]  TAKE 1/2 TO 1 TABLET BY MOUTH AT BEDTIME AS NEEDED FOR SLEEP Norleen Lynwood ORN, MD  Active Self, Pharmacy Records           Recommendation:   Continue Current Plan of Care  Follow Up Plan:   Telephone follow-up in 1 week- as scheduled 06/03/24  Pls call/ message for questions,  Beatris Blinda Lawrence, RN, BSN, CCRN Alumnus RN Care Manager  Transitions of Care  [facility] - [facility] [phone]: direct office

## 2024-05-28 NOTE — Patient Instructions (Signed)
 Visit Information  Thank you for taking time to visit with me today. Please don't hesitate to contact me if I can be of assistance to you before our next scheduled telephone appointment.  Our next appointment is by telephone on Wednesday June 03, 2024 at 1:00 pm  Please call the care guide team at [phone] if you need to cancel or reschedule your appointment.   Following are the goals we discussed today:  Patient Self Care Activities:  Attend all scheduled provider appointments Call provider office for new concerns or questions  Notify RN Care Manager of TOC call rescheduling needs Participate in Transition of Care Program/Attend TOC scheduled calls Take medications as prescribed   Continue pacing activity as your recuperation from your recent hospitalization continues Use assistive devices as needed to prevent falls If you believe your condition is getting worse- contact your care providers (doctors) promptly- reaching out to your doctor early when you have concerns can prevent you from having to go to the hospital  If you are experiencing a Mental Health or Behavioral Health Crisis or need someone to talk to, please  call the Suicide and Crisis Lifeline: 988 call the [place]  National Suicide Prevention Lifeline: 619-590-5817 or TTY: (616)102-5559 TTY 718-552-8709) to talk to a trained counselor call 1-800-273-TALK (toll free, 24 hour hotline) go to [facility] [address] [place] [phone]) call the [facility] Crisis Line: 310-097-5307 call 911   Patient verbalizes understanding of instructions and care plan provided today and agrees to view in MyChart. Active MyChart status and patient understanding of how to access instructions and care plan via MyChart confirmed with patient.     Geneviene Tesch Mckinney Maigen Mozingo, RN, BSN, Media planner  Transitions of Care  [facility] - [facility] [phone]:  direct office

## 2024-06-03 ENCOUNTER — Other Ambulatory Visit: Payer: Self-pay | Admitting: *Deleted

## 2024-06-03 DIAGNOSIS — R338 Other retention of urine: Secondary | ICD-10-CM

## 2024-06-03 DIAGNOSIS — N1831 Chronic kidney disease, stage 3a: Secondary | ICD-10-CM

## 2024-06-03 DIAGNOSIS — N390 Urinary tract infection, site not specified: Secondary | ICD-10-CM

## 2024-06-03 NOTE — Transitions of Care (Post Inpatient/ED Visit) (Signed)
 Transition of Care week # 5/ day #30  Visit Note  06/03/2024  Name: Neil [name] MRN: [id number]          DOB: 04/12/1941  Situation: Patient enrolled in [facility] 30-day program. Visit completed with patient by telephone.   06/03/24:  TOC 30--day outreach completed; patient has successfully met/ accomplished established goals for TOC 30-day program without hospital readmission and referral was sent for ongoing follow up with longitudinal RN CM   Background:  Hospitalized July 13- 20, 2025 for UTI with acute renal failure/ renal stone, mechanical falls at home (1) unplanned hospital admission x last (6) and (12) months  Initial Transition Care Management Follow-up Telephone Call    Past Medical History:  Diagnosis Date   ALLERGIC RHINITIS 12/04/2007   Anxiety    BENIGN PROSTATIC HYPERTROPHY 06/05/2007   BUNDLE BRANCH BLOCK, RIGHT 07/21/2009   BURSITIS, RIGHT SHOULDER 11/06/2010   Choledocholithiasis    CKD (chronic kidney disease) stage 3, GFR 30-59 ml/min (HCC) 08/02/2017   COLONIC POLYPS, HX OF 06/05/2007   tubular adenoma also 02/19/2013   Coronary artery calcification seen on CT scan 03/07/2016   Depression    DIABETES MELLITUS, TYPE II 12/04/2007   DIVERTICULOSIS, COLON 07/21/2009   ERECTILE DYSFUNCTION 06/05/2007   Gallstones    HYPERLIPIDEMIA 06/05/2007   on meds   HYPERTENSION 06/05/2007   on meds   Hypotension    INGUINAL HERNIA, RIGHT, SMALL 11/06/2010   Liver abscess 05/23/2015   SKIN LESION 06/03/2008   Assessment:  I am still doing just fine, not having any problems at all, no changes to medicines or in my condition.  Still using walker when I go outside of my home. Managing the suprapubic catheter without problems like I have for the last several years, go back for another exchange of the catheter in early September.  I called the insurance company and they gave me some helpful information- I am going to keep working on finding out the medicaid part of my  benefits- going to keep contacting them until I understand all of the benefits available for me    Denies clinical concerns and sounds to be in no distress throughout TOC 30-day program outreach call today  Patient Reported Symptoms: Cognitive Cognitive Status: Normal speech and language skills, Alert and oriented to person, place, and time, Insightful and able to interpret abstract concepts Cognitive/Intellectual Conditions Management [RPT]: None reported or documented in medical history or problem list      Neurological Neurological Review of Symptoms: No symptoms reported    HEENT HEENT Symptoms Reported: No symptoms reported      Cardiovascular Cardiovascular Symptoms Reported: No symptoms reported Does patient have uncontrolled Hypertension?: No Cardiovascular Management Strategies: Medication therapy, Routine screening, Coping strategies  Respiratory Respiratory Symptoms Reported: No symptoms reported Other Respiratory Symptoms: Denies shortness of breath; sounds to be in no respiratory distress throughout TOC call Respiratory Management Strategies: Routine screening, Coping strategies  Endocrine Endocrine Symptoms Reported: No symptoms reported Is patient diabetic?: No    Gastrointestinal Gastrointestinal Symptoms Reported: No symptoms reported Additional Gastrointestinal Details: Reports fine appetite; normal and regular Bowel movements Gastrointestinal Management Strategies: Coping strategies    Genitourinary Genitourinary Symptoms Reported: No symptoms reported Other Genitourinary Symptoms: Continues to report skin looks fine, no redness, no drainage around suprapubic catheter; continues to self-manage suprapubic catheter and verbalizes excellent ongoing understanding of self- management of suprapubic catheter; confirms next catheter exchange is scheduled on 06/18/24 at [facility]; reinforced signs/ symptoms UTI/  catheter site infection along with correspnding action  plan: patient continues able to verbalize excellent understanding of same without prompting Genitourinary Management Strategies: Medical device, Coping strategies  Integumentary Integumentary Symptoms Reported: No symptoms reported Additional Integumentary Details: Continues to report skin looks fine, no redness, no drainage around suprapubic catheter; continues to self-manage suprapubic catheter and verbalizes excellent ongoing understanding of self- management of suprapubic catheter; confirms next catheter exchange is scheduled on 06/18/24 at [facility]; reinforced signs/ symptoms UTI/ catheter site infection along with correspnding action plan: patient continues able to verbalize excellent understanding of same without prompting Skin Management Strategies: Routine screening, Coping strategies  Musculoskeletal Musculoskelatal Symptoms Reviewed: Limited mobility Additional Musculoskeletal Details: confirmed currently requiring/ using assistive devices for ambulation - continues using walker when he leaves the house; reports does not routinely use when at home- uses prn when at home; confirmed no falls since last outreach Musculoskeletal Management Strategies: Routine screening, Coping strategies, Medical device      Psychosocial Psychosocial Symptoms Reported: No symptoms reported Behavioral Management Strategies: Support system Major Change/Loss/Stressor/Fears (CP): Denies Quality of Family Relationships: helpful, involved, supportive   There were no vitals filed for this visit.  Medications Reviewed Today     Reviewed by Niklas Chretien M, RN (Registered Nurse) on 06/03/24 at 1259  Med List Status: <None>   Medication Order Taking? Sig Documenting Provider Last Dose Status Informant  acetaminophen  (TYLENOL ) 500 MG tablet [id number]  Take 1,500 mg by mouth daily as needed for mild pain. [provider]  Active Self, Pharmacy Records  Alpha-D-Galactosidase [facility] PO) [id number]   Take 1 tablet by mouth at bedtime. [provider]  Active Self, Pharmacy Records  aspirin  81 MG EC tablet [id number]  Take 81 mg by mouth daily. [provider]  Active Self, Pharmacy Records  benazepril  (LOTENSIN ) 40 MG tablet [id number]  Take 40 mg by mouth daily. [provider]  Active   Cholecalciferol (VITAMIN D ) 50 MCG (2000 UT) CAPS [id number]  Take 2,000 Units by mouth at bedtime. [provider]  Active Self, Pharmacy Records  citalopram  (CELEXA ) 10 MG tablet [id number]  TAKE 1 TABLET BY MOUTH DAILY Norleen Lynwood ORN, MD  Active Self, Pharmacy Records  cyclobenzaprine  (FLEXERIL ) 5 MG tablet [id number]  TAKE 1 TABLET BY MOUTH 3 TIMES  DAILY AS NEEDED FOR MUSCLE  SPASM(S) Norleen Lynwood ORN, MD  Active Self, Pharmacy Records  docusate sodium  (COLACE) 100 MG capsule [id number]  Take 100 mg by mouth daily as needed for mild constipation or moderate constipation. [provider]  Active Self, Pharmacy Records  ezetimibe  (ZETIA ) 10 MG tablet [id number]  TAKE 1 TABLET BY MOUTH DAILY Norleen Lynwood ORN, MD  Active Self, Pharmacy Records  fenofibrate  160 MG tablet [id number]  TAKE 1 TABLET BY MOUTH AT  BEDTIME Norleen Lynwood ORN, MD  Active Self, Pharmacy Records  finasteride  (PROSCAR ) 5 MG tablet [id number]  TAKE 1 TABLET BY MOUTH DAILY Norleen Lynwood ORN, MD  Active Self, Pharmacy Records  ibuprofen (ADVIL) 200 MG tablet [id number]  Take 600 mg by mouth 2 (two) times daily as needed for moderate pain. [provider]  Active Self, Pharmacy Records  methenamine  (HIPREX ) 1 g tablet [id number]  Take 1 tablet (1 g total) by mouth 2 (two) times daily. Norleen Lynwood ORN, MD  Active   metoprolol  succinate (TOPROL -XL) 50 MG 24 hr tablet [id number]  Take 1 tablet (50 mg total) by mouth daily. Take with or immediately following a meal. Norleen Lynwood ORN, MD  Active   Multiple Vitamin (MULTIVITAMIN WITH MINERALS) TABS tablet [id number]  Take 1 tablet by mouth daily. Earley Saucer, MD  Active Self,  Pharmacy Records  MYRBETRIQ 25 MG TB24 tablet [id number]  Take 25 mg by mouth daily.  Patient not taking: Reported on 05/07/2024   [provider]  Active Self, Pharmacy Records  oxybutynin  (DITROPAN -XL) 10 MG 24 hr tablet [id number]  Take 10 mg by mouth daily. [provider]  Active Self, Pharmacy Records  silodosin  (RAPAFLO ) 4 MG CAPS capsule [id number]  TAKE 1 CAPSULE BY MOUTH DAILY  WITH BREAKFAST John, James W, MD  Active   sodium chloride  (OCEAN) 0.65 % nasal spray [id number]  Place 1 spray into the nose daily as needed for congestion.  [provider]  Active Self, Pharmacy Records  terazosin  (HYTRIN ) 2 MG capsule [id number]  TAKE 1 CAPSULE BY MOUTH AT  BEDTIME Norleen Lynwood ORN, MD  Active   traZODone  (DESYREL ) 50 MG tablet [id number]  TAKE 1/2 TO 1 TABLET BY MOUTH AT BEDTIME AS NEEDED FOR SLEEP Norleen Lynwood ORN, MD  Active Self, Pharmacy Records           Recommendation:   Continue Current Plan of Care  Follow Up Plan:   Referral to RN Case Manager Closing From:  Transitions of Care Program  Pls call/ message for questions,  Aisling Emigh Mckinney Clemencia Helzer, RN, BSN, CCRN Alumnus RN Care Manager  Transitions of Care  [facility] - [facility] [phone]: direct office

## 2024-06-03 NOTE — Patient Instructions (Signed)
 Visit Information  Thank you for taking time to visit with me today. Please don't hesitate to contact me if I can be of assistance to you before our next scheduled telephone appointment.  It has been a pleasure working with you over the last 30 days!  Great job managing your care after your hospital visit!  I am glad that you are doing well!  Please listen for a call from the scheduling care guide to schedule a phone call with the new nurse care manager who will pick up in your care where we are leaving off today  Following are the goals we discussed today:  Patient Self Care Activities:  Attend all scheduled provider appointments Call provider office for new concerns or questions  Notify RN Care Manager of TOC call rescheduling needs Take medications as prescribed   Continue pacing activity as your recuperation from your recent hospitalization continues Use assistive devices as needed to prevent falls If you believe your condition is getting worse- contact your care providers (doctors) promptly- reaching out to your doctor early when you have concerns can prevent you from having to go to the hospital  If you are experiencing a Mental Health or Behavioral Health Crisis or need someone to talk to, please  call the Suicide and Crisis Lifeline: 988 call the [place]  National Suicide Prevention Lifeline: 657-025-7205 or TTY: 989-063-9564 TTY 608-044-6696) to talk to a trained counselor call 1-800-273-TALK (toll free, 24 hour hotline) go to [facility] [address], [place] [phone]) call the [facility] Crisis Line: 308-059-2440 call 911   Patient verbalizes understanding of instructions and care plan provided today and agrees to view in MyChart. Active MyChart status and patient understanding of how to access instructions and care plan via MyChart confirmed with patient.     Megon Kalina Mckinney Braidyn Scorsone, RN, BSN, Media planner   Transitions of Care  [facility]BC[facility] - [facility] [phone]: direct office

## 2024-06-05 ENCOUNTER — Telehealth: Payer: Self-pay | Admitting: *Deleted

## 2024-06-05 NOTE — Progress Notes (Signed)
 Complex Care Management Note  Care Guide Note 06/05/2024 Name: Neil [name] MRN: [id number] DOB: 1940-11-17  Neil [name] is a 83 y.o. year old male who sees Norleen Lynwood ORN, MD for primary care. I reached out to Neil [name] by phone today to offer complex care management services.  [name] was given information about Complex Care Management services today including:   The Complex Care Management services include support from the care team which includes your Nurse Care Manager, Clinical Social Worker, or Pharmacist.  The Complex Care Management team is here to help remove barriers to the health concerns and goals most important to you. Complex Care Management services are voluntary, and the patient may decline or stop services at any time by request to their care team member.   Complex Care Management Consent Status: Patient agreed to services and verbal consent obtained.   Follow up plan:  Telephone appointment with complex care management team member scheduled for:  06/17/2024  Encounter Outcome:  Patient Scheduled  Thedford Franks, CMA [place]  [facility], [facility] Guide Direct Dial: [phone]  Fax: [phone] Website: [email or web address]

## 2024-06-11 ENCOUNTER — Other Ambulatory Visit: Payer: Self-pay | Admitting: Internal Medicine

## 2024-06-17 ENCOUNTER — Other Ambulatory Visit: Payer: Self-pay

## 2024-06-17 DIAGNOSIS — N390 Urinary tract infection, site not specified: Secondary | ICD-10-CM

## 2024-06-17 DIAGNOSIS — N1831 Chronic kidney disease, stage 3a: Secondary | ICD-10-CM

## 2024-06-17 NOTE — Patient Instructions (Signed)
 Visit Information  Thank you for taking time to visit with me today. Please don't hesitate to contact me if I can be of assistance to you before our next scheduled appointment.  Our next appointment is by telephone on 06/30/24 at 3:00 pm Please call the care guide team at [phone] if you need to cancel or reschedule your appointment.   Following is a copy of your care plan:   Goals Addressed             This Visit's Progress    VBCI RN Care Plan        Please call the Suicide and Crisis Lifeline: 988 call the [place]  National Suicide Prevention Lifeline: (559) 389-3143 or TTY: (779) 100-6500 TTY (223)308-7755) to talk to a trained counselor call 1-800-273-TALK (toll free, 24 hour hotline) if you are experiencing a Mental Health or Behavioral Health Crisis or need someone to talk to.  Patient verbalizes understanding of instructions and care plan provided today and agrees to view in MyChart. Active MyChart status and patient understanding of how to access instructions and care plan via MyChart confirmed with patient.     Heddy Shutter, RN, MSN, BSN, CCM [place]  [facility], Population Health Case Manager Phone: [phone]

## 2024-06-17 NOTE — Patient Outreach (Unsigned)
 Complex Care Management   Visit Note  06/17/2024  Name:  Neil [name] MRN: [id number] DOB: September 18, 1941  Situation: Referral received for Complex Care Management related to UTI I obtained verbal consent from Patient.  Visit completed with Patient  on the phone  Background:   Past Medical History:  Diagnosis Date   ALLERGIC RHINITIS 12/04/2007   Anxiety    BENIGN PROSTATIC HYPERTROPHY 06/05/2007   BUNDLE BRANCH BLOCK, RIGHT 07/21/2009   BURSITIS, RIGHT SHOULDER 11/06/2010   Choledocholithiasis    CKD (chronic kidney disease) stage 3, GFR 30-59 ml/min (HCC) 08/02/2017   COLONIC POLYPS, HX OF 06/05/2007   tubular adenoma also 02/19/2013   Coronary artery calcification seen on CT scan 03/07/2016   Depression    DIABETES MELLITUS, TYPE II 12/04/2007   DIVERTICULOSIS, COLON 07/21/2009   ERECTILE DYSFUNCTION 06/05/2007   Gallstones    HYPERLIPIDEMIA 06/05/2007   on meds   HYPERTENSION 06/05/2007   on meds   Hypotension    INGUINAL HERNIA, RIGHT, SMALL 11/06/2010   Liver abscess 05/23/2015   SKIN LESION 06/03/2008    Assessment: patient declines to go over all ov medication. Is receptive to clinical pharmacy reviewe re: med reconciliation, polyphamacy, medication interactions and to see if there are some medications that he can stop taking. Patient Reported Symptoms:  Cognitive Cognitive Status: No symptoms reported      Neurological Neurological Review of Symptoms: No symptoms reported    HEENT HEENT Symptoms Reported: No symptoms reported      Cardiovascular Cardiovascular Symptoms Reported: No symptoms reported Does patient have uncontrolled Hypertension?: No    Respiratory Respiratory Symptoms Reported: No symptoms reported    Endocrine Endocrine Symptoms Reported: No symptoms reported Is patient diabetic?: No Is patient checking blood sugars at home?: No    Gastrointestinal Gastrointestinal Symptoms Reported: No symptoms reported      Genitourinary  Genitourinary Symptoms Reported: No symptoms reported Other Genitourinary Symptoms: [facility] see every month. Dr. Rogers tomorrow. admitted 04/26/24-05/03/24 with UTI-failed Bactrim    Integumentary Integumentary Symptoms Reported: No symptoms reported    Musculoskeletal Musculoskelatal Symptoms Reviewed: Limited mobility        Psychosocial Psychosocial Symptoms Reported: No symptoms reported     Quality of Family Relationships: supportive, involved Do you feel physically threatened by others?: No    06/17/2024    PHQ2-9 Depression Screening   Little interest or pleasure in doing things    Feeling down, depressed, or hopeless    PHQ-2 - Total Score    Trouble falling or staying asleep, or sleeping too much    Feeling tired or having little energy    Poor appetite or overeating     Feeling bad about yourself - or that you are a failure or have let yourself or your family down    Trouble concentrating on things, such as reading the newspaper or watching television    Moving or speaking so slowly that other people could have noticed.  Or the opposite - being so fidgety or restless that you have been moving around a lot more than usual    Thoughts that you would be better off dead, or hurting yourself in some way    PHQ2-9 Total Score    If you checked off any problems, how difficult have these problems made it for you to do your work, take care of things at home, or get along with other people    Depression Interventions/Treatment    There were no vitals  filed for this visit.  Medication review initiated, however patient declines to finish. He states he is tired of going over medications with everyone that calls him. He has an appointment with the urology clinic on tomorrow. Mr. Rosemond states he is on a lot of medication and expressed concerns that different disciplines may not know everything that he is taking. He would also like to know If he can decrease some of his  medications, in addition he wants to know if any of the medication he is on can cause interactions. he is agreeable to a clinical pharmacy referral:   Recommendation:   Specialty provider follow-up [facility]   Follow Up Plan:   Telephone follow up appointment date/time:  06/30/24 at 3:00 pm  Heddy Shutter, RN, MSN, BSN, CCM [place]  [facility], Population Health Case Manager Phone: [phone]

## 2024-06-18 DIAGNOSIS — N319 Neuromuscular dysfunction of bladder, unspecified: Secondary | ICD-10-CM | POA: Diagnosis not present

## 2024-06-18 DIAGNOSIS — R338 Other retention of urine: Secondary | ICD-10-CM | POA: Diagnosis not present

## 2024-06-26 ENCOUNTER — Ambulatory Visit: Payer: Self-pay

## 2024-06-26 DIAGNOSIS — N319 Neuromuscular dysfunction of bladder, unspecified: Secondary | ICD-10-CM | POA: Diagnosis not present

## 2024-06-26 DIAGNOSIS — R338 Other retention of urine: Secondary | ICD-10-CM | POA: Diagnosis not present

## 2024-06-26 NOTE — Telephone Encounter (Signed)
  Rozell Powell HERO, RN  Registered Nurse   Telephone Encounter Signed   Creation Time: 06/26/2024  3:55 PM   Signed     Pt can't hear RN and asked to be called by office. RN called CAL to give heads up that triaged Is needed.

## 2024-06-26 NOTE — Telephone Encounter (Signed)
 Called pt and he wanted to just make us  aware of recent uti's and the use of his super catheter. Pt states he saw urology today and they were able to change this for him and tested his urine.  Pt reports recent memory issues, weakness and fatigue. States urologist was supposed to reach out to Dr. Norleen to see what to do and is wondering if he should make appt. I went ahead and scheduled appt for pt for Wednesday for this to be addressed

## 2024-06-26 NOTE — Telephone Encounter (Signed)
 Pt can't hear RN and asked to be called by office. RN called CAL to give heads up that triaged Is needed.

## 2024-06-29 ENCOUNTER — Telehealth: Payer: Self-pay | Admitting: *Deleted

## 2024-06-29 NOTE — Progress Notes (Unsigned)
 Care Guide Pharmacy Note  06/29/2024 Name: Neil [name] MRN: [id number] DOB: Aug 03, 1941  Referred By: Norleen Lynwood ORN, MD Reason for referral: Call Attempt #1 and Complex Care Management (Outreach to schedule referral with pharmacist )   Neil [name] is a 83 y.o. year old male who is a primary care patient of Norleen Lynwood ORN, MD.  Neil [name] was referred to the pharmacist for assistance related to: CKD Stage 3  An unsuccessful telephone outreach was attempted today to contact the patient who was referred to the pharmacy team for assistance with medication management. Additional attempts will be made to contact the patient.  Thedford Franks, CMA [place]  [facility], [facility] Guide Direct Dial: [phone]  Fax: [phone] Website: [email or web address]

## 2024-06-30 ENCOUNTER — Other Ambulatory Visit: Payer: Self-pay

## 2024-06-30 NOTE — Progress Notes (Signed)
 Care Guide Pharmacy Note  06/30/2024 Name: Neil [name] MRN: [id number] DOB: [birth year]  Referred By: Norleen Lynwood ORN, MD Reason for referral: Call Attempt #1 and Complex Care Management (Outreach to schedule referral with pharmacist )   Neil [name] is a 83 y.o. year old male who is a primary care patient of Norleen Lynwood ORN, MD.  Neil [name] was referred to the pharmacist for assistance related to: CKD Stage 3  Successful contact was made with the patient to discuss pharmacy services including being ready for the pharmacist to call at least 5 minutes before the scheduled appointment time and to have medication bottles and any blood pressure readings ready for review. The patient agreed to meet with the pharmacist via telephone visit on 07/09/2024  Thedford Franks, CMA [place]  [facility], [facility] Guide Direct Dial: [phone]  Fax: [phone] Website: [email or web address]

## 2024-06-30 NOTE — Patient Instructions (Signed)
 Visit Information  Thank you for taking time to visit with me today. Please don't hesitate to contact me if I can be of assistance to you before our next scheduled appointment.  Your next care management appointment is by telephone on 07/29/24 at 2:30 pm   Please call the care guide team at [phone] if you need to cancel, schedule, or reschedule an appointment.   Please call the Suicide and Crisis Lifeline: 988 call the [place]  National Suicide Prevention Lifeline: 938-459-1220 or TTY: (616) 823-1252 TTY 437-782-4483) to talk to a trained counselor call 1-800-273-TALK (toll free, 24 hour hotline) if you are experiencing a Mental Health or Behavioral Health Crisis or need someone to talk to.   Heddy Shutter, RN, MSN, BSN, CCM [place]  [facility], Population Health Case Manager Phone: [phone]

## 2024-06-30 NOTE — Patient Outreach (Signed)
 Complex Care Management   Visit Note  06/30/2024  Name:  Neil [name] MRN: [id number] DOB: Apr 12, 1941  Situation: Referral received for Complex Care Management related to UTI I obtained verbal consent from Patient.  Visit completed with Patient  on the phone  Background:   Past Medical History:  Diagnosis Date   ALLERGIC RHINITIS 12/04/2007   Anxiety    BENIGN PROSTATIC HYPERTROPHY 06/05/2007   BUNDLE BRANCH BLOCK, RIGHT 07/21/2009   BURSITIS, RIGHT SHOULDER 11/06/2010   Choledocholithiasis    CKD (chronic kidney disease) stage 3, GFR 30-59 ml/min (HCC) 08/02/2017   COLONIC POLYPS, HX OF 06/05/2007   tubular adenoma also 02/19/2013   Coronary artery calcification seen on CT scan 03/07/2016   Depression    DIABETES MELLITUS, TYPE II 12/04/2007   DIVERTICULOSIS, COLON 07/21/2009   ERECTILE DYSFUNCTION 06/05/2007   Gallstones    HYPERLIPIDEMIA 06/05/2007   on meds   HYPERTENSION 06/05/2007   on meds   Hypotension    INGUINAL HERNIA, RIGHT, SMALL 11/06/2010   Liver abscess 05/23/2015   SKIN LESION 06/03/2008    Assessment: Patient Reported Symptoms:  Cognitive Cognitive Status: No symptoms reported, Alert and oriented to person, place, and time      Neurological Neurological Review of Symptoms: No symptoms reported    HEENT HEENT Symptoms Reported: No symptoms reported      Cardiovascular Cardiovascular Symptoms Reported: No symptoms reported    Respiratory Respiratory Symptoms Reported: No symptoms reported    Endocrine Endocrine Symptoms Reported: No symptoms reported Is patient diabetic?: No    Gastrointestinal Gastrointestinal Symptoms Reported: No symptoms reported      Genitourinary Genitourinary Symptoms Reported: No symptoms reported    Integumentary Integumentary Symptoms Reported: No symptoms reported    Musculoskeletal Musculoskelatal Symptoms Reviewed: Weakness Additional Musculoskeletal Details: paitent reports general weakness with some  shakiness   Falls in the past year?: Yes Number of falls in past year: 2 or more (prior to hospitalization. denies falls since hospitalization) Was there an injury with Fall?: No Fall Risk Category Calculator: 2 Patient Fall Risk Level: Moderate Fall Risk    Psychosocial Psychosocial Symptoms Reported: No symptoms reported         There were no vitals filed for this visit.  Medications Reviewed Today     Reviewed by Pantera Winterrowd M, RN (Registered Nurse) on 06/30/24 at 1526  Med List Status: <None>   Medication Order Taking? Sig Documenting Provider Last Dose Status Informant  acetaminophen  (TYLENOL ) 500 MG tablet [id number] Yes Take 1,500 mg by mouth daily as needed for mild pain. [provider]  Active Self, Pharmacy Records  Alpha-D-Galactosidase [facility] PO) [id number] Yes Take 1 tablet by mouth at bedtime. [provider]  Active Self, Pharmacy Records  aspirin  81 MG EC tablet [id number] Yes Take 81 mg by mouth daily. [provider]  Active Self, Pharmacy Records  benazepril  (LOTENSIN ) 40 MG tablet [id number] Yes Take 40 mg by mouth daily. [provider]  Active   Cholecalciferol (VITAMIN D ) 50 MCG (2000 UT) CAPS [id number] Yes Take 2,000 Units by mouth at bedtime. [provider]  Active Self, Pharmacy Records  citalopram  (CELEXA ) 10 MG tablet [id number] Yes TAKE 1 TABLET BY MOUTH DAILY Norleen Lynwood ORN, MD  Active Self, Pharmacy Records  cyclobenzaprine  (FLEXERIL ) 5 MG tablet [id number] Yes TAKE 1 TABLET BY MOUTH 3 TIMES  DAILY AS NEEDED FOR MUSCLE  SPASM(S) Norleen Lynwood ORN, MD  Active Self, Pharmacy Records  docusate sodium  (COLACE) 100  MG capsule [id number] Yes Take 100 mg by mouth daily as needed for mild constipation or moderate constipation. [provider]  Active Self, Pharmacy Records  ezetimibe  (ZETIA ) 10 MG tablet [id number] Yes TAKE 1 TABLET BY MOUTH DAILY Norleen Lynwood ORN, MD  Active Self, Pharmacy Records  fenofibrate  160 MG  tablet [id number] Yes TAKE 1 TABLET BY MOUTH AT  BEDTIME Norleen Lynwood ORN, MD  Active Self, Pharmacy Records  finasteride  (PROSCAR ) 5 MG tablet [id number] Yes TAKE 1 TABLET BY MOUTH DAILY Norleen Lynwood ORN, MD  Active Self, Pharmacy Records  ibuprofen (ADVIL) 200 MG tablet [id number] Yes Take 600 mg by mouth 2 (two) times daily as needed for moderate pain. [provider]  Active Self, Pharmacy Records  methenamine  (HIPREX ) 1 g tablet [id number] Yes Take 1 tablet (1 g total) by mouth 2 (two) times daily. Norleen Lynwood ORN, MD  Active   metoprolol  succinate (TOPROL -XL) 50 MG 24 hr tablet [id number] Yes Take 1 tablet (50 mg total) by mouth daily. Take with or immediately following a meal. Norleen Lynwood ORN, MD  Active   Multiple Vitamin (MULTIVITAMIN WITH MINERALS) TABS tablet [id number] Yes Take 1 tablet by mouth daily. Earley Saucer, MD  Active Self, Pharmacy Records  MYRBETRIQ 25 MG TB24 tablet [id number]  Take 25 mg by mouth daily.  Patient not taking: Reported on 05/07/2024   [provider]  Active Self, Pharmacy Records  oxybutynin  (DITROPAN -XL) 10 MG 24 hr tablet [id number] Yes Take 10 mg by mouth daily. [provider]  Active Self, Pharmacy Records  silodosin  (RAPAFLO ) 4 MG CAPS capsule [id number]  TAKE 1 CAPSULE BY MOUTH DAILY  WITH BREAKFAST John, James W, MD  Active   sodium chloride  (OCEAN) 0.65 % nasal spray [id number] Yes Place 1 spray into the nose daily as needed for congestion.  [provider]  Active Self, Pharmacy Records  terazosin  (HYTRIN ) 2 MG capsule [id number] Yes TAKE 1 CAPSULE BY MOUTH AT  BEDTIME Norleen Lynwood ORN, MD  Active   traZODone  (DESYREL ) 50 MG tablet [id number] Yes TAKE 1/2 TO 1 TABLET BY MOUTH AT BEDTIME AS NEEDED FOR SLEEP Norleen Lynwood ORN, MD  Active Self, Pharmacy Records          Recommendation:   Continue Current Plan of Care  Follow Up Plan:   Telephone follow up appointment date/time:  07/29/24 at 2:30 pm  Heddy Shutter, RN, MSN, BSN, CCM [facility]  [facility], Population Health Case Manager Phone: [phone]

## 2024-07-01 ENCOUNTER — Encounter: Payer: Self-pay | Admitting: Internal Medicine

## 2024-07-01 ENCOUNTER — Ambulatory Visit: Admitting: Internal Medicine

## 2024-07-01 VITALS — BP 162/74 | HR 53 | Temp 98.0°F | Ht 68.0 in | Wt 151.8 lb

## 2024-07-01 DIAGNOSIS — E119 Type 2 diabetes mellitus without complications: Secondary | ICD-10-CM

## 2024-07-01 DIAGNOSIS — E538 Deficiency of other specified B group vitamins: Secondary | ICD-10-CM | POA: Diagnosis not present

## 2024-07-01 DIAGNOSIS — E559 Vitamin D deficiency, unspecified: Secondary | ICD-10-CM

## 2024-07-01 DIAGNOSIS — N390 Urinary tract infection, site not specified: Secondary | ICD-10-CM | POA: Diagnosis not present

## 2024-07-01 MED ORDER — CEFTRIAXONE SODIUM 1 G IJ SOLR
1.0000 g | Freq: Once | INTRAMUSCULAR | Status: AC
  Administered 2024-07-01: 1 g via INTRAMUSCULAR

## 2024-07-01 NOTE — Patient Instructions (Addendum)
 You had the Antibiotic shot today (rocephin  1 gm) for boost antibiotic in addition to the cephalexin for the UTI  Please continue all other medications as before, and refills have been done if requested.  Please have the pharmacy call with any other refills you may need.  Please keep your appointments with your specialists as you may have planned  Please make an Appointment to return in Oct 1, or sooner if needed, also with Lab Appointment for testing done 3-5 days before at the FIRST FLOOR Lab (so this is for TWO appointments - please see the scheduling desk as you leave)

## 2024-07-01 NOTE — Assessment & Plan Note (Signed)
 Urine culture pending, pt has cephalexin rx started yesterday per urology and should f/u their culture results, and today for rocephin  1 gm IM

## 2024-07-01 NOTE — Assessment & Plan Note (Signed)
 Lab Results  Component Value Date   VITAMINB12 887 01/09/2024   Stable, cont oral replacement - b12 1000 mcg qd

## 2024-07-01 NOTE — Assessment & Plan Note (Signed)
 Last vitamin D Lab Results  Component Value Date   VD25OH 67.02 01/09/2024   Stable, cont oral replacement

## 2024-07-01 NOTE — Assessment & Plan Note (Signed)
 Lab Results  Component Value Date   HGBA1C 7.0 (H) 01/09/2024   Uncontrolled but ok for age, pt to continue diet, wt control

## 2024-07-01 NOTE — Progress Notes (Signed)
 Patient ID: Neil [name], male   DOB: [birth year], 83 y.o.   MRN: [id number]        Chief Complaint: follow up UTI, low b12, dm, low vit d       HPI:  Neil [name] is a 82 y.o. male here with c/o recent diagnosis UTI and started cephalexin 500 tid x 7 days per urology yesterday.  Had a chill last pm though and made appt for today.  Urine culture per urology pending by report.  C/o worsening fatigue and weakness since yesterday with frequency, urgency but Denies urinary symptoms such as flank pain, hematuria or n/v, fever,  Pt denies chest pain, increased sob or doe, wheezing, orthopnea, PND, increased LE swelling, palpitations, dizziness or syncope.   Pt denies polydipsia, polyuria, or new focal neuro s/s.          Wt Readings from Last 3 Encounters:  07/01/24 151 lb 12.8 oz (68.9 kg)  05/07/24 157 lb 3.2 oz (71.3 kg)  05/03/24 154 lb 12.2 oz (70.2 kg)   BP Readings from Last 3 Encounters:  07/01/24 (!) 162/74  05/07/24 (!) 158/86  05/03/24 (!) 154/70         Past Medical History:  Diagnosis Date   ALLERGIC RHINITIS 12/04/2007   Allergy    Anxiety    BENIGN PROSTATIC HYPERTROPHY 06/05/2007   BUNDLE BRANCH BLOCK, RIGHT 07/21/2009   BURSITIS, RIGHT SHOULDER 11/06/2010   Choledocholithiasis    CKD (chronic kidney disease) stage 3, GFR 30-59 ml/min (HCC) 08/02/2017   COLONIC POLYPS, HX OF 06/05/2007   tubular adenoma also 02/19/2013   Coronary artery calcification seen on CT scan 03/07/2016   Depression    DIABETES MELLITUS, TYPE [name] 12/04/2007   DIVERTICULOSIS, COLON 07/21/2009   ERECTILE DYSFUNCTION 06/05/2007   Gallstones    HYPERLIPIDEMIA 06/05/2007   on meds   HYPERTENSION 06/05/2007   on meds   Hypotension    INGUINAL HERNIA, RIGHT, SMALL 11/06/2010   Liver abscess 05/23/2015   SKIN LESION 06/03/2008   Ulcer    Past Surgical History:  Procedure Laterality Date   BIOPSY  09/08/2021   Procedure: BIOPSY;  Surgeon: Eda Iha, MD;  Location: [facility];   Service: Endoscopy;;   CHOLECYSTECTOMY N/A 03/02/2015   Procedure: LAPAROSCOPIC CHOLECYSTECTOMY;  Surgeon: Elon Pacini, MD;  Location: [facility];  Service: General;  Laterality: N/A;   COLONOSCOPY  2017   JP-MAC-suprep (good)-TA   CYSTOSCOPY N/A 05/29/2022   Procedure: PHYLLIS;  Surgeon: Rosalind Zachary NOVAK, MD;  Location: [facility];  Service: Urology;  Laterality: N/A;   ERCP N/A 03/01/2015   Procedure: ENDOSCOPIC RETROGRADE CHOLANGIOPANCREATOGRAPHY (ERCP);  Surgeon: Norleen LOISE Kiang, MD;  Location: [facility];  Service: Endoscopy;  Laterality: N/A;  Dr Pacini wants patient to stay overnight after ERCP   ERCP N/A 05/03/2015   Procedure: ENDOSCOPIC RETROGRADE CHOLANGIOPANCREATOGRAPHY (ERCP);  Surgeon: Norleen LOISE Kiang, MD;  Location: [facility];  Service: Endoscopy;  Laterality: N/A;   FINGER SURGERY Left    little finger   FLEXIBLE SIGMOIDOSCOPY N/A 09/08/2021   Procedure: FLEXIBLE SIGMOIDOSCOPY;  Surgeon: Eda Iha, MD;  Location: [facility];  Service: Endoscopy;  Laterality: N/A;   FRACTURE SURGERY     HEMORRHOID SURGERY  1973   INGUINAL HERNIA REPAIR Right 2014   8'14 repair   INSERTION OF SUPRAPUBIC CATHETER N/A 05/29/2022   Procedure: INSERTION OF SUPRAPUBIC CATHETER;  Surgeon: Rosalind Zachary NOVAK, MD;  Location: [facility];  Service: Urology;  Laterality: N/A;  30 MINS   POLYPECTOMY     TONSILLECTOMY     WISDOM TOOTH EXTRACTION  1972    reports that he quit smoking about 17 years ago. His smoking use included cigarettes. He has never used smokeless tobacco. He reports that he does not currently use alcohol. He reports that he does not use drugs. family history includes Brain cancer (age of onset: 50) in his mother; Colon cancer in his paternal uncle; Colon cancer (age of onset: 44) in his mother; Colon polyps in his paternal uncle; Colon polyps (age of onset: 68) in his mother. Allergies  Allergen Reactions   Bactrim [Sulfamethoxazole-Trimethoprim] Other (See Comments)    Patient  request Bactrim be listed as an allergy due to his UTI was not responsive to Bactrim and he feels like it hindered his treatment.   Statins Other (See Comments)    Joint pain   Zanaflex  [Tizanidine ] Other (See Comments)    Dizzy, sleepy   Current Outpatient Medications on File Prior to Visit  Medication Sig Dispense Refill   acetaminophen  (TYLENOL ) 500 MG tablet Take 1,500 mg by mouth daily as needed for mild pain.     Alpha-D-Galactosidase ([facility]AN[facility] PO) Take 1 tablet by mouth at bedtime.     aspirin  81 MG EC tablet Take 81 mg by mouth daily.     benazepril  (LOTENSIN ) 40 MG tablet Take 40 mg by mouth daily.     cephALEXin (KEFLEX) 500 MG capsule Take 500 mg by mouth 2 (two) times daily.     Cholecalciferol (VITAMIN D ) 50 MCG (2000 UT) CAPS Take 2,000 Units by mouth at bedtime.     citalopram  (CELEXA ) 10 MG tablet TAKE 1 TABLET BY MOUTH DAILY 90 tablet 3   cyclobenzaprine  (FLEXERIL ) 5 MG tablet TAKE 1 TABLET BY MOUTH 3 TIMES  DAILY AS NEEDED FOR MUSCLE  SPASM(S) 270 tablet 1   docusate sodium  (COLACE) 100 MG capsule Take 100 mg by mouth daily as needed for mild constipation or moderate constipation.     ezetimibe  (ZETIA ) 10 MG tablet TAKE 1 TABLET BY MOUTH DAILY 90 tablet 3   fenofibrate  160 MG tablet TAKE 1 TABLET BY MOUTH AT  BEDTIME 100 tablet 2   finasteride  (PROSCAR ) 5 MG tablet TAKE 1 TABLET BY MOUTH DAILY 100 tablet 2   ibuprofen (ADVIL) 200 MG tablet Take 600 mg by mouth 2 (two) times daily as needed for moderate pain.     methenamine  (HIPREX ) 1 g tablet Take 1 tablet (1 g total) by mouth 2 (two) times daily. 180 tablet 1   metoprolol  succinate (TOPROL -XL) 50 MG 24 hr tablet Take 1 tablet (50 mg total) by mouth daily. Take with or immediately following a meal. 90 tablet 3   Multiple Vitamin (MULTIVITAMIN WITH MINERALS) TABS tablet Take 1 tablet by mouth daily. 30 tablet 0   oxybutynin  (DITROPAN -XL) 10 MG 24 hr tablet Take 10 mg by mouth daily.     sodium chloride  (OCEAN) 0.65 % nasal  spray Place 1 spray into the nose daily as needed for congestion.      terazosin  (HYTRIN ) 2 MG capsule TAKE 1 CAPSULE BY MOUTH AT  BEDTIME 80 capsule 3   traZODone  (DESYREL ) 50 MG tablet TAKE 1/2 TO 1 TABLET BY MOUTH AT BEDTIME AS NEEDED FOR SLEEP 90 tablet 3   MYRBETRIQ 25 MG TB24 tablet Take 25 mg by mouth daily. (Patient not taking: Reported on 05/07/2024)     silodosin  (RAPAFLO ) 4 MG CAPS capsule TAKE 1 CAPSULE  BY MOUTH DAILY  WITH BREAKFAST 80 capsule 3   No current facility-administered medications on file prior to visit.        ROS:  All others reviewed and negative.  Objective        PE:  BP (!) 162/74   Pulse (!) 53   Temp 98 F (36.7 C)   Ht 5' 8 (1.727 m)   Wt 151 lb 12.8 oz (68.9 kg)   SpO2 99%   BMI 23.08 kg/m                 Constitutional: Pt appears mild ill, fatigued               HENT: Head: NCAT.                Right Ear: External ear normal.                 Left Ear: External ear normal.                Eyes: . Pupils are equal, round, and reactive to light. Conjunctivae and EOM are normal               Nose: without d/c or deformity               Neck: Neck supple. Gross normal ROM               Cardiovascular: Normal rate and regular rhythm.                 Pulmonary/Chest: Effort normal and breath sounds without rales or wheezing.                Abd:  Soft, NT, ND, + BS, no organomegaly               Neurological: Pt is alert. At baseline orientation, motor grossly intact               Skin: Skin is warm. No rashes, no other new lesions, LE edema - none               Psychiatric: Pt behavior is normal without agitation   Micro: none  Cardiac tracings I have personally interpreted today:  none  Pertinent Radiological findings (summarize): none   Lab Results  Component Value Date   WBC 6.7 04/30/2024   HGB 11.7 (L) 04/30/2024   HCT 35.6 (L) 04/30/2024   PLT 216 04/30/2024   GLUCOSE 104 (H) 04/30/2024   CHOL 159 01/09/2024   TRIG 185.0 (H)  01/09/2024   HDL 35.80 (L) 01/09/2024   LDLDIRECT 106.0 05/18/2021   LDLCALC 86 01/09/2024   ALT 13 04/28/2024   AST 17 04/28/2024   NA 136 04/30/2024   K 4.4 04/30/2024   CL 107 04/30/2024   CREATININE 1.27 (H) 04/30/2024   BUN 19 04/30/2024   CO2 22 04/30/2024   TSH 1.42 01/09/2024   PSA 0.57 01/08/2023   INR 1.25 05/23/2015   HGBA1C 7.0 (H) 01/09/2024   MICROALBUR 73.7 (H) 01/09/2024   Assessment/Plan:  ARRIE ZUERCHER [name] is a 83 y.o. White or Caucasian [1] male with  has a past medical history of ALLERGIC RHINITIS (12/04/2007), Allergy, Anxiety, BENIGN PROSTATIC HYPERTROPHY (06/05/2007), BUNDLE BRANCH BLOCK, RIGHT (07/21/2009), BURSITIS, RIGHT SHOULDER (11/06/2010), Choledocholithiasis, CKD (chronic kidney disease) stage 3, GFR 30-59 ml/min (HCC) (08/02/2017), COLONIC POLYPS, HX OF (06/05/2007), Coronary artery calcification seen on CT scan (03/07/2016), Depression, DIABETES MELLITUS,  TYPE [name] (12/04/2007), DIVERTICULOSIS, COLON (07/21/2009), ERECTILE DYSFUNCTION (06/05/2007), Gallstones, HYPERLIPIDEMIA (06/05/2007), HYPERTENSION (06/05/2007), Hypotension, INGUINAL HERNIA, RIGHT, SMALL (11/06/2010), Liver abscess (05/23/2015), SKIN LESION (06/03/2008), and Ulcer.  Urinary tract infection without hematuria Urine culture pending, pt has cephalexin rx started yesterday per urology and should f/u their culture results, and today for rocephin  1 gm IM  Vitamin D  deficiency Last vitamin D  Lab Results  Component Value Date   VD25OH 67.02 01/09/2024   Stable, cont oral replacement   Diabetes mellitus type 2 in nonobese [facility]) Lab Results  Component Value Date   HGBA1C 7.0 (H) 01/09/2024   Uncontrolled but ok for age, pt to continue diet, wt control  B12 deficiency Lab Results  Component Value Date   VITAMINB12 887 01/09/2024   Stable, cont oral replacement - b12 1000 mcg qd  Followup: Return in about 2 weeks (around 07/15/2024).  Lynwood Rush, MD 07/01/2024 7:16 PM [facility]  Primary Care - [facility]

## 2024-07-02 ENCOUNTER — Other Ambulatory Visit (INDEPENDENT_AMBULATORY_CARE_PROVIDER_SITE_OTHER)

## 2024-07-02 ENCOUNTER — Ambulatory Visit: Payer: Self-pay | Admitting: Internal Medicine

## 2024-07-02 DIAGNOSIS — E538 Deficiency of other specified B group vitamins: Secondary | ICD-10-CM

## 2024-07-02 DIAGNOSIS — E559 Vitamin D deficiency, unspecified: Secondary | ICD-10-CM | POA: Diagnosis not present

## 2024-07-02 DIAGNOSIS — E119 Type 2 diabetes mellitus without complications: Secondary | ICD-10-CM

## 2024-07-02 LAB — CBC WITH DIFFERENTIAL/PLATELET
Basophils Absolute: 0 K/uL (ref 0.0–0.1)
Basophils Relative: 0.6 % (ref 0.0–3.0)
Eosinophils Absolute: 0.2 K/uL (ref 0.0–0.7)
Eosinophils Relative: 3.4 % (ref 0.0–5.0)
HCT: 40.6 % (ref 39.0–52.0)
Hemoglobin: 13.3 g/dL (ref 13.0–17.0)
Lymphocytes Relative: 15.1 % (ref 12.0–46.0)
Lymphs Abs: 1.1 K/uL (ref 0.7–4.0)
MCHC: 32.7 g/dL (ref 30.0–36.0)
MCV: 86.9 fl (ref 78.0–100.0)
Monocytes Absolute: 0.5 K/uL (ref 0.1–1.0)
Monocytes Relative: 7.9 % (ref 3.0–12.0)
Neutro Abs: 5.1 K/uL (ref 1.4–7.7)
Neutrophils Relative %: 73 % (ref 43.0–77.0)
Platelets: 248 K/uL (ref 150.0–400.0)
RBC: 4.67 Mil/uL (ref 4.22–5.81)
RDW: 14.1 % (ref 11.5–15.5)
WBC: 7 K/uL (ref 4.0–10.5)

## 2024-07-02 LAB — BASIC METABOLIC PANEL WITH GFR
BUN: 27 mg/dL — ABNORMAL HIGH (ref 6–23)
CO2: 26 meq/L (ref 19–32)
Calcium: 10.1 mg/dL (ref 8.4–10.5)
Chloride: 104 meq/L (ref 96–112)
Creatinine, Ser: 1.65 mg/dL — ABNORMAL HIGH (ref 0.40–1.50)
GFR: 38.52 mL/min — ABNORMAL LOW (ref >60.00)
Glucose, Bld: 118 mg/dL — ABNORMAL HIGH (ref 70–99)
Potassium: 4.3 meq/L (ref 3.5–5.1)
Sodium: 139 meq/L (ref 135–145)

## 2024-07-02 LAB — LIPID PANEL
Cholesterol: 129 mg/dL (ref 0–200)
HDL: 37.7 mg/dL — ABNORMAL LOW (ref >39.00)
LDL Cholesterol: 63 mg/dL (ref 0–99)
NonHDL: 90.8
Total CHOL/HDL Ratio: 3
Triglycerides: 137 mg/dL (ref 0.0–149.0)
VLDL: 27.4 mg/dL (ref 0.0–40.0)

## 2024-07-02 LAB — HEPATIC FUNCTION PANEL
ALT: 13 U/L (ref 0–53)
AST: 17 U/L (ref 0–37)
Albumin: 4.4 g/dL (ref 3.5–5.2)
Alkaline Phosphatase: 20 U/L — ABNORMAL LOW (ref 39–117)
Bilirubin, Direct: 0.2 mg/dL (ref 0.0–0.3)
Total Bilirubin: 0.6 mg/dL (ref 0.2–1.2)
Total Protein: 7.2 g/dL (ref 6.0–8.3)

## 2024-07-02 LAB — MICROALBUMIN / CREATININE URINE RATIO
Creatinine,U: 181 mg/dL
Microalb Creat Ratio: 434 mg/g — ABNORMAL HIGH (ref 0.0–30.0)
Microalb, Ur: 78.6 mg/dL — ABNORMAL HIGH (ref 0.0–1.9)

## 2024-07-02 LAB — VITAMIN D 25 HYDROXY (VIT D DEFICIENCY, FRACTURES): VITD: 68.91 ng/mL (ref 30.00–100.00)

## 2024-07-02 LAB — TSH: TSH: 0.78 u[IU]/mL (ref 0.35–5.50)

## 2024-07-02 LAB — HEMOGLOBIN A1C: Hgb A1c MFr Bld: 6.4 % (ref 4.6–6.5)

## 2024-07-02 LAB — VITAMIN B12: Vitamin B-12: 1070 pg/mL — ABNORMAL HIGH (ref 211–911)

## 2024-07-05 ENCOUNTER — Other Ambulatory Visit: Payer: Self-pay | Admitting: Internal Medicine

## 2024-07-09 ENCOUNTER — Other Ambulatory Visit (INDEPENDENT_AMBULATORY_CARE_PROVIDER_SITE_OTHER): Admitting: Pharmacist

## 2024-07-09 DIAGNOSIS — E782 Mixed hyperlipidemia: Secondary | ICD-10-CM

## 2024-07-09 DIAGNOSIS — E119 Type 2 diabetes mellitus without complications: Secondary | ICD-10-CM

## 2024-07-09 NOTE — Patient Instructions (Signed)
 It was a pleasure speaking with you today!  Discontinue metformin  and fenofibrate . Ask your urologist about if you still need oxybutynin .  Feel free to call with any questions or concerns!  Darrelyn Drum, PharmD, BCPS, CPP Clinical Pharmacist Practitioner [facility] at [facility] [phone]

## 2024-07-09 NOTE — Progress Notes (Signed)
 07/09/2024 Name: Neil [name] MRN: [id number] DOB: 1941/08/16  Chief Complaint  Patient presents with   Medication Management         Neil [name] is a 83 y.o. year old male who presented for a telephone visit.   They were referred to the pharmacist by the [facility] [place] Complex Case Management program for assistance in managing medication review.   Subjective:  Care Team: Primary Care Provider: Norleen Lynwood ORN, MD ; Next Scheduled Visit: 07/15/24   Medication Access/Adherence  Current Pharmacy:  [id number] [address], [place] - [address] [place] [address] Phone: [phone] Fax: [phone]  [facility] - [place], [place] - [address] [place] [address] Phone: [phone] Fax: [phone]   Patient reports affordability concerns with their medications: No  Patient reports access/transportation concerns to their pharmacy: No  Patient reports adherence concerns with their medications:  No    Reviewed medication list and indications for current medications. Of note, pt has still been taking metformin  500 mg daily, although it was discontinued during July AKI hospitalizayion. Pt only takes APAP or ibuprofen as needed, maybe once a week.   Objective:  Lab Results  Component Value Date   HGBA1C 6.4 07/02/2024    Lab Results  Component Value Date   CREATININE 1.65 (H) 07/02/2024   BUN 27 (H) 07/02/2024   NA 139 07/02/2024   K 4.3 07/02/2024   CL 104 07/02/2024   CO2 26 07/02/2024    Lab Results  Component Value Date   CHOL 129 07/02/2024   HDL 37.70 (L) 07/02/2024   LDLCALC 63 07/02/2024   LDLDIRECT 106.0 05/18/2021   TRIG 137.0 07/02/2024   CHOLHDL 3 07/02/2024    Medications Reviewed Today     Reviewed by Merceda Lela JONELLE, RPH (Pharmacist) on 07/09/24 at 1618  Med List Status: <None>   Medication Order Taking? Sig Documenting Provider Last Dose Status  Informant  acetaminophen  (TYLENOL ) 500 MG tablet [id number] Yes Take 1,500 mg by mouth daily as needed for mild pain. [provider]  Active Self, Pharmacy Records  Alpha-D-Galactosidase [facility] PO) [id number] Yes Take 1 tablet by mouth at bedtime. [provider]  Active Self, Pharmacy Records  aspirin  81 MG EC tablet [id number] Yes Take 81 mg by mouth daily. [provider]  Active Self, Pharmacy Records  benazepril  (LOTENSIN ) 40 MG tablet [id number] Yes Take 40 mg by mouth daily. [provider]  Active     Discontinued 07/09/24 1443 (Completed Course)   Cholecalciferol (VITAMIN D ) 50 MCG (2000 UT) CAPS [id number] Yes Take 2,000 Units by mouth at bedtime. [provider]  Active Self, Pharmacy Records  citalopram  (CELEXA ) 10 MG tablet [id number] Yes TAKE 1 TABLET BY MOUTH DAILY Norleen Lynwood ORN, MD  Active Self, Pharmacy Records  cyclobenzaprine  (FLEXERIL ) 5 MG tablet [id number] Yes TAKE 1 TABLET BY MOUTH 3 TIMES  DAILY AS NEEDED FOR MUSCLE  SPASM(S)  Patient taking differently: Take 5 mg by mouth daily. TAKE 1 TABLET BY MOUTH 3  TIMES DAILY AS NEEDED FOR  MUSCLE SPASM(S)   Norleen Lynwood ORN, MD  Active Self, Pharmacy Records  docusate sodium  (COLACE) 100 MG capsule [id number] Yes Take 100 mg by mouth daily as needed for mild constipation or moderate constipation. [provider]  Active Self, Pharmacy Records  ezetimibe  (ZETIA ) 10 MG tablet [id number] Yes TAKE 1 TABLET BY MOUTH DAILY Norleen Lynwood  LELON, MD  Active Self, Pharmacy Records  fenofibrate  160 MG tablet [id number] Yes TAKE 1 TABLET BY MOUTH AT  BEDTIME Norleen Lynwood LELON, MD  Active Self, Pharmacy Records  finasteride  (PROSCAR ) 5 MG tablet [id number] Yes TAKE 1 TABLET BY MOUTH DAILY Norleen Lynwood LELON, MD  Active   ibuprofen (ADVIL) 200 MG tablet [id number] Yes Take 600 mg by mouth 2 (two) times daily as needed for moderate pain. [provider]  Active Self, Pharmacy Records  metFORMIN  (GLUCOPHAGE ) 500 MG  tablet [id number] Yes Take 500 mg by mouth daily with breakfast. [provider]  Active   methenamine  (HIPREX ) 1 g tablet [id number] Yes Take 1 tablet (1 g total) by mouth 2 (two) times daily. Norleen Lynwood LELON, MD  Active   metoprolol  succinate (TOPROL -XL) 50 MG 24 hr tablet [id number] Yes Take 1 tablet (50 mg total) by mouth daily. Take with or immediately following a meal. Norleen Lynwood LELON, MD  Active   Multiple Vitamin (MULTIVITAMIN WITH MINERALS) TABS tablet [id number] Yes Take 1 tablet by mouth daily. Rizwan, Saima, MD  Active Self, Pharmacy Records  oxybutynin  (DITROPAN -XL) 10 MG 24 hr tablet [id number] Yes Take 10 mg by mouth daily. [provider]  Active Self, Pharmacy Records  sodium chloride  (OCEAN) 0.65 % nasal spray [id number]  Place 1 spray into the nose daily as needed for congestion.  [provider]  Active Self, Pharmacy Records  terazosin  (HYTRIN ) 2 MG capsule [id number] Yes TAKE 1 CAPSULE BY MOUTH AT  BEDTIME Norleen Lynwood LELON, MD  Active   traZODone  (DESYREL ) 50 MG tablet [id number] Yes TAKE 1/2 TO 1 TABLET BY MOUTH AT BEDTIME AS NEEDED FOR SLEEP  Patient taking differently: Take 25-50 mg by mouth at bedtime. for sleep   Norleen Lynwood LELON, MD  Active Self, Pharmacy Records              Assessment/Plan:   Given A1c 6.4% w/ A1c goal <8% plus current CrCl 33 ml/min, recommend discontinue metformin  500 mg daily. Monitor A1c in 3-6 months.  Recommend discontinue fenofibrate . Currently taking fenofibrate  160 mg daily, recommended dose for CrCl 30-60 ml/min is 67 mg daily and contraindicated for CrCl <30. Last TG for WNL.  Recommend pt discuss with urology if he still needs to take oxybutynin  given suprapubic catheter.  Discussed to use ibuprofen sparingly given renal function. Use Tylenol  if needing more frequent pain relief.  Follow Up Plan: PRN  Darrelyn Drum, PharmD, BCPS, CPP Clinical Pharmacist Practitioner [facility] at [facility] [phone]

## 2024-07-13 ENCOUNTER — Encounter: Payer: Self-pay | Admitting: Internal Medicine

## 2024-07-15 ENCOUNTER — Encounter: Payer: Self-pay | Admitting: Internal Medicine

## 2024-07-15 ENCOUNTER — Ambulatory Visit (INDEPENDENT_AMBULATORY_CARE_PROVIDER_SITE_OTHER): Admitting: Internal Medicine

## 2024-07-15 VITALS — BP 122/70 | HR 50 | Temp 98.2°F | Ht 68.0 in | Wt 154.0 lb

## 2024-07-15 DIAGNOSIS — N1831 Chronic kidney disease, stage 3a: Secondary | ICD-10-CM | POA: Diagnosis not present

## 2024-07-15 DIAGNOSIS — E782 Mixed hyperlipidemia: Secondary | ICD-10-CM | POA: Diagnosis not present

## 2024-07-15 DIAGNOSIS — E119 Type 2 diabetes mellitus without complications: Secondary | ICD-10-CM | POA: Diagnosis not present

## 2024-07-15 DIAGNOSIS — I10 Essential (primary) hypertension: Secondary | ICD-10-CM

## 2024-07-15 DIAGNOSIS — E559 Vitamin D deficiency, unspecified: Secondary | ICD-10-CM

## 2024-07-15 DIAGNOSIS — N390 Urinary tract infection, site not specified: Secondary | ICD-10-CM

## 2024-07-15 DIAGNOSIS — Z23 Encounter for immunization: Secondary | ICD-10-CM | POA: Diagnosis not present

## 2024-07-15 DIAGNOSIS — E538 Deficiency of other specified B group vitamins: Secondary | ICD-10-CM | POA: Diagnosis not present

## 2024-07-15 NOTE — Progress Notes (Unsigned)
 Patient ID: Neil [name], male   DOB: [birth year], 83 y.o.   MRN: [id number]        Chief Complaint: follow up uti, ckd3a, dm, thn, hld, low b12 and D       HPI:  Neil [name] is a 83 y.o. male here overall doing ok,  Pt denies chest pain, increased sob or doe, wheezing, orthopnea, PND, increased LE swelling, palpitations, dizziness or syncope.   Pt denies polydipsia, polyuria, or new focal neuro s/s.    Pt denies fever, wt loss, night sweats, loss of appetite, or other constitutional symptoms  Did have recent uti now resolved, Denies urinary symptoms such as dysuria, frequency, urgency, flank pain, hematuria or n/v, fever, chills. Due for flu shot.  Wt Readings from Last 3 Encounters:  07/15/24 154 lb (69.9 kg)  07/01/24 151 lb 12.8 oz (68.9 kg)  05/07/24 157 lb 3.2 oz (71.3 kg)   BP Readings from Last 3 Encounters:  07/15/24 122/70  07/01/24 (!) 162/74  05/07/24 (!) 158/86         Past Medical History:  Diagnosis Date   ALLERGIC RHINITIS 12/04/2007   Allergy    Anxiety    BENIGN PROSTATIC HYPERTROPHY 06/05/2007   BUNDLE BRANCH BLOCK, RIGHT 07/21/2009   BURSITIS, RIGHT SHOULDER 11/06/2010   Choledocholithiasis    CKD (chronic kidney disease) stage 3, GFR 30-59 ml/min (HCC) 08/02/2017   COLONIC POLYPS, HX OF 06/05/2007   tubular adenoma also 02/19/2013   Coronary artery calcification seen on CT scan 03/07/2016   Depression    DIABETES MELLITUS, TYPE [name] 12/04/2007   DIVERTICULOSIS, COLON 07/21/2009   ERECTILE DYSFUNCTION 06/05/2007   Gallstones    HYPERLIPIDEMIA 06/05/2007   on meds   HYPERTENSION 06/05/2007   on meds   Hypotension    INGUINAL HERNIA, RIGHT, SMALL 11/06/2010   Liver abscess 05/23/2015   SKIN LESION 06/03/2008   Ulcer    Past Surgical History:  Procedure Laterality Date   BIOPSY  09/08/2021   Procedure: BIOPSY;  Surgeon: Eda Iha, MD;  Location: [facility];  Service: Endoscopy;;   CHOLECYSTECTOMY N/A 03/02/2015   Procedure:  LAPAROSCOPIC CHOLECYSTECTOMY;  Surgeon: Elon Pacini, MD;  Location: [facility];  Service: General;  Laterality: N/A;   COLONOSCOPY  2017   JP-MAC-suprep (good)-TA   CYSTOSCOPY N/A 05/29/2022   Procedure: PHYLLIS;  Surgeon: Rosalind Zachary NOVAK, MD;  Location: [facility];  Service: Urology;  Laterality: N/A;   ERCP N/A 03/01/2015   Procedure: ENDOSCOPIC RETROGRADE CHOLANGIOPANCREATOGRAPHY (ERCP);  Surgeon: Norleen LOISE Kiang, MD;  Location: [facility];  Service: Endoscopy;  Laterality: N/A;  Dr Pacini wants patient to stay overnight after ERCP   ERCP N/A 05/03/2015   Procedure: ENDOSCOPIC RETROGRADE CHOLANGIOPANCREATOGRAPHY (ERCP);  Surgeon: Norleen LOISE Kiang, MD;  Location: [facility];  Service: Endoscopy;  Laterality: N/A;   FINGER SURGERY Left    little finger   FLEXIBLE SIGMOIDOSCOPY N/A 09/08/2021   Procedure: FLEXIBLE SIGMOIDOSCOPY;  Surgeon: Eda Iha, MD;  Location: [facility];  Service: Endoscopy;  Laterality: N/A;   FRACTURE SURGERY     HEMORRHOID SURGERY  1973   INGUINAL HERNIA REPAIR Right 2014   8'14 repair   INSERTION OF SUPRAPUBIC CATHETER N/A 05/29/2022   Procedure: INSERTION OF SUPRAPUBIC CATHETER;  Surgeon: Rosalind Zachary NOVAK, MD;  Location: [facility];  Service: Urology;  Laterality: N/A;  30 MINS   POLYPECTOMY     TONSILLECTOMY     WISDOM TOOTH EXTRACTION  1972  reports that he quit smoking about 17 years ago. His smoking use included cigarettes. He has never used smokeless tobacco. He reports that he does not currently use alcohol. He reports that he does not use drugs. family history includes Brain cancer (age of onset: 49) in his mother; Colon cancer in his paternal uncle; Colon cancer (age of onset: 33) in his mother; Colon polyps in his paternal uncle; Colon polyps (age of onset: 64) in his mother. Allergies  Allergen Reactions   Bactrim [Sulfamethoxazole-Trimethoprim] Other (See Comments)    Patient request Bactrim be listed as an allergy due to his UTI was not  responsive to Bactrim and he feels like it hindered his treatment.   Statins Other (See Comments)    Joint pain   Zanaflex  [Tizanidine ] Other (See Comments)    Dizzy, sleepy   Current Outpatient Medications on File Prior to Visit  Medication Sig Dispense Refill   acetaminophen  (TYLENOL ) 500 MG tablet Take 1,500 mg by mouth daily as needed for mild pain.     Alpha-D-Galactosidase (BEANO PO) Take 1 tablet by mouth at bedtime.     aspirin  81 MG EC tablet Take 81 mg by mouth daily.     benazepril  (LOTENSIN ) 40 MG tablet Take 40 mg by mouth daily.     Cholecalciferol (VITAMIN D ) 50 MCG (2000 UT) CAPS Take 2,000 Units by mouth at bedtime.     citalopram  (CELEXA ) 10 MG tablet TAKE 1 TABLET BY MOUTH DAILY 90 tablet 3   cyclobenzaprine  (FLEXERIL ) 5 MG tablet TAKE 1 TABLET BY MOUTH 3 TIMES  DAILY AS NEEDED FOR MUSCLE  SPASM(S) (Patient taking differently: Take 5 mg by mouth daily. TAKE 1 TABLET BY MOUTH 3  TIMES DAILY AS NEEDED FOR  MUSCLE SPASM(S)) 270 tablet 1   docusate sodium  (COLACE) 100 MG capsule Take 100 mg by mouth daily as needed for mild constipation or moderate constipation.     ezetimibe  (ZETIA ) 10 MG tablet TAKE 1 TABLET BY MOUTH DAILY 90 tablet 3   fenofibrate  160 MG tablet TAKE 1 TABLET BY MOUTH AT  BEDTIME 100 tablet 2   finasteride  (PROSCAR ) 5 MG tablet TAKE 1 TABLET BY MOUTH DAILY 100 tablet 2   ibuprofen (ADVIL) 200 MG tablet Take 600 mg by mouth 2 (two) times daily as needed for moderate pain.     metFORMIN  (GLUCOPHAGE ) 500 MG tablet Take 500 mg by mouth daily with breakfast.     methenamine  (HIPREX ) 1 g tablet Take 1 tablet (1 g total) by mouth 2 (two) times daily. 180 tablet 1   metoprolol  succinate (TOPROL -XL) 50 MG 24 hr tablet Take 1 tablet (50 mg total) by mouth daily. Take with or immediately following a meal. 90 tablet 3   Multiple Vitamin (MULTIVITAMIN WITH MINERALS) TABS tablet Take 1 tablet by mouth daily. 30 tablet 0   oxybutynin  (DITROPAN -XL) 10 MG 24 hr tablet Take 10  mg by mouth daily.     sodium chloride  (OCEAN) 0.65 % nasal spray Place 1 spray into the nose daily as needed for congestion.      terazosin  (HYTRIN ) 2 MG capsule TAKE 1 CAPSULE BY MOUTH AT  BEDTIME 80 capsule 3   traZODone  (DESYREL ) 50 MG tablet TAKE 1/2 TO 1 TABLET BY MOUTH AT BEDTIME AS NEEDED FOR SLEEP (Patient taking differently: Take 25-50 mg by mouth at bedtime. for sleep) 90 tablet 3   No current facility-administered medications on file prior to visit.        ROS:  All others reviewed and negative.  Objective        PE:  BP 122/70 (BP Location: Right Arm, Patient Position: Sitting, Cuff Size: Normal)   Pulse (!) 50   Temp 98.2 F (36.8 C) (Oral)   Ht 5' 8 (1.727 m)   Wt 154 lb (69.9 kg)   SpO2 100%   BMI 23.42 kg/m                 Constitutional: Pt appears in NAD               HENT: Head: NCAT.                Right Ear: External ear normal.                 Left Ear: External ear normal.                Eyes: . Pupils are equal, round, and reactive to light. Conjunctivae and EOM are normal               Nose: without d/c or deformity               Neck: Neck supple. Gross normal ROM               Cardiovascular: Normal rate and regular rhythm.                 Pulmonary/Chest: Effort normal and breath sounds without rales or wheezing.                Abd:  Soft, NT, ND, + BS, no organomegaly               Neurological: Pt is alert. At baseline orientation, motor grossly intact               Skin: Skin is warm. No rashes, no other new lesions, LE edema - none               Psychiatric: Pt behavior is normal without agitation   Micro: none  Cardiac tracings I have personally interpreted today:  none  Pertinent Radiological findings (summarize): none   Lab Results  Component Value Date   WBC 7.0 07/02/2024   HGB 13.3 07/02/2024   HCT 40.6 07/02/2024   PLT 248.0 07/02/2024   GLUCOSE 118 (H) 07/02/2024   CHOL 129 07/02/2024   TRIG 137.0 07/02/2024   HDL 37.70 (L)  07/02/2024   LDLDIRECT 106.0 05/18/2021   LDLCALC 63 07/02/2024   ALT 13 07/02/2024   AST 17 07/02/2024   NA 139 07/02/2024   K 4.3 07/02/2024   CL 104 07/02/2024   CREATININE 1.65 (H) 07/02/2024   BUN 27 (H) 07/02/2024   CO2 26 07/02/2024   TSH 0.78 07/02/2024   PSA 0.57 01/08/2023   INR 1.25 05/23/2015   HGBA1C 6.4 07/02/2024   MICROALBUR 78.6 (H) 07/02/2024   Assessment/Plan:  Neil [name] is a 83 y.o. White or Caucasian [1] male with  has a past medical history of ALLERGIC RHINITIS (12/04/2007), Allergy, Anxiety, BENIGN PROSTATIC HYPERTROPHY (06/05/2007), BUNDLE BRANCH BLOCK, RIGHT (07/21/2009), BURSITIS, RIGHT SHOULDER (11/06/2010), Choledocholithiasis, CKD (chronic kidney disease) stage 3, GFR 30-59 ml/min (HCC) (08/02/2017), COLONIC POLYPS, HX OF (06/05/2007), Coronary artery calcification seen on CT scan (03/07/2016), Depression, DIABETES MELLITUS, TYPE [name] (12/04/2007), DIVERTICULOSIS, COLON (07/21/2009), ERECTILE DYSFUNCTION (06/05/2007), Gallstones, HYPERLIPIDEMIA (06/05/2007), HYPERTENSION (06/05/2007), Hypotension, INGUINAL HERNIA, RIGHT, SMALL (11/06/2010), Liver abscess (05/23/2015), SKIN LESION (  06/03/2008), and Ulcer.  Vitamin D  deficiency Last vitamin D  Lab Results  Component Value Date   VD25OH 68.91 07/02/2024   Stable, cont oral replacement   Urinary tract infection without hematuria Resolved,  to f/u any worsening symptoms or concerns  Hyperlipidemia Lab Results  Component Value Date   LDLCALC 63 07/02/2024   Stable, pt to continue fenofibrate  160 mg every day, zetia  10 mg qd   Essential hypertension, benign BP Readings from Last 3 Encounters:  07/15/24 122/70  07/01/24 (!) 162/74  05/07/24 (!) 158/86   Stable, pt to continue medical treatment lotensin  40 every day, toprol  xl 50 qd   Diabetes mellitus type 2 in nonobese [facility]) Lab Results  Component Value Date   HGBA1C 6.4 07/02/2024   Stable in non obese, pt to continue current medical  treatment metformin  500 mg qd   CKD (chronic kidney disease) stage 3, GFR 30-59 ml/min (HCC) Lab Results  Component Value Date   CREATININE 1.65 (H) 07/02/2024   Stable overall, cont to avoid nephrotoxins, also refer renal for ongoing consideration  B12 deficiency Lab Results  Component Value Date   VITAMINB12 1,070 (H) 07/02/2024   Stable, cont oral replacement - b12 1000 mcg qd  Followup: Return in about 6 months (around 01/13/2025).  Lynwood Rush, MD 07/16/2024 11:18 AM [facility] [place] [facility]

## 2024-07-15 NOTE — Patient Instructions (Signed)
 You had the flu shot today  Please continue all other medications as before, and refills have been done if requested.  Please have the pharmacy call with any other refills you may need.  Please continue your efforts at being more active, low cholesterol diet, and weight control.  You are otherwise up to date with prevention measures today.  Please keep your appointments with your specialists as you may have planned - urology  You will be contacted regarding the referral for: Nephrology (medical kidney doctor)  Please make an Appointment to return in 6 months, or sooner if needed, also with Lab Appointment for testing done 3-5 days before at the FIRST FLOOR Lab (so this is for TWO appointments - please see the scheduling desk as you leave)

## 2024-07-16 ENCOUNTER — Encounter: Payer: Self-pay | Admitting: Internal Medicine

## 2024-07-16 NOTE — Assessment & Plan Note (Signed)
 Lab Results  Component Value Date   VITAMINB12 1,070 (H) 07/02/2024   Stable, cont oral replacement - b12 1000 mcg qd

## 2024-07-16 NOTE — Assessment & Plan Note (Signed)
 Lab Results  Component Value Date   HGBA1C 6.4 07/02/2024   Stable in non obese, pt to continue current medical treatment metformin  500 mg qd

## 2024-07-16 NOTE — Assessment & Plan Note (Signed)
 Lab Results  Component Value Date   CREATININE 1.65 (H) 07/02/2024   Stable overall, cont to avoid nephrotoxins, also refer renal for ongoing consideration

## 2024-07-16 NOTE — Assessment & Plan Note (Signed)
 Last vitamin D  Lab Results  Component Value Date   VD25OH 68.91 07/02/2024   Stable, cont oral replacement

## 2024-07-16 NOTE — Assessment & Plan Note (Signed)
 Lab Results  Component Value Date   LDLCALC 63 07/02/2024   Stable, pt to continue fenofibrate  160 mg every day, zetia  10 mg qd

## 2024-07-16 NOTE — Assessment & Plan Note (Signed)
Resolved,  to f/u any worsening symptoms or concerns  

## 2024-07-16 NOTE — Assessment & Plan Note (Signed)
 BP Readings from Last 3 Encounters:  07/15/24 122/70  07/01/24 (!) 162/74  05/07/24 (!) 158/86   Stable, pt to continue medical treatment lotensin  40 every day, toprol  xl 50 qd

## 2024-07-18 ENCOUNTER — Encounter: Payer: Self-pay | Admitting: Internal Medicine

## 2024-07-20 ENCOUNTER — Other Ambulatory Visit: Payer: Self-pay | Admitting: Family

## 2024-07-20 MED ORDER — METHENAMINE HIPPURATE 1 G PO TABS: 1.0000 g | ORAL_TABLET | Freq: Two times a day (BID) | ORAL | 1 refills | Status: DC

## 2024-07-22 NOTE — Telephone Encounter (Signed)
 Done

## 2024-07-28 DIAGNOSIS — R339 Retention of urine, unspecified: Secondary | ICD-10-CM | POA: Diagnosis not present

## 2024-07-29 ENCOUNTER — Other Ambulatory Visit: Payer: Self-pay

## 2024-07-29 NOTE — Patient Outreach (Signed)
 Complex Care Management   Visit Note  07/29/2024  Name:  Neil [name] MRN: [id number] DOB: 10/05/1941  Situation: Referral received for Complex Care Management related to UTI I obtained verbal consent from Patient.  Visit completed with Patient  on the phone  Background:   Past Medical History:  Diagnosis Date   ALLERGIC RHINITIS 12/04/2007   Allergy    Anxiety    BENIGN PROSTATIC HYPERTROPHY 06/05/2007   BUNDLE BRANCH BLOCK, RIGHT 07/21/2009   BURSITIS, RIGHT SHOULDER 11/06/2010   Choledocholithiasis    CKD (chronic kidney disease) stage 3, GFR 30-59 ml/min (HCC) 08/02/2017   COLONIC POLYPS, HX OF 06/05/2007   tubular adenoma also 02/19/2013   Coronary artery calcification seen on CT scan 03/07/2016   Depression    DIABETES MELLITUS, TYPE II 12/04/2007   DIVERTICULOSIS, COLON 07/21/2009   ERECTILE DYSFUNCTION 06/05/2007   Gallstones    HYPERLIPIDEMIA 06/05/2007   on meds   HYPERTENSION 06/05/2007   on meds   Hypotension    INGUINAL HERNIA, RIGHT, SMALL 11/06/2010   Liver abscess 05/23/2015   SKIN LESION 06/03/2008   Ulcer     Assessment: Patient Reported Symptoms:  Cognitive Cognitive Status: No symptoms reported      Neurological Neurological Review of Symptoms: No symptoms reported    HEENT HEENT Symptoms Reported: No symptoms reported      Cardiovascular Cardiovascular Symptoms Reported: No symptoms reported Weight: 157 lb (71.2 kg) (07/28/24 home reading)  Respiratory      Endocrine Endocrine Symptoms Reported: No symptoms reported    Gastrointestinal Gastrointestinal Symptoms Reported: No symptoms reported      Genitourinary Genitourinary Symptoms Reported: No symptoms reported, Other Other Genitourinary Symptoms: suprapubic catheter. patient states is changed every month. last changed on 07/28/24.    Integumentary Integumentary Symptoms Reported: No symptoms reported    Musculoskeletal Musculoskelatal Symptoms Reviewed: Unsteady  gait Additional Musculoskeletal Details: patient reports a little unsteadiness and uses walker when out. Reinterated fall prevention strategies. Patient states he does not want to fall again either. He denies dizziness. discussed possible physical therapy. patient declines at this time. states if condition does not improve or worsens will notfy PCP.        Psychosocial Psychosocial Symptoms Reported: No symptoms reported          There were no vitals filed for this visit.  Medications Reviewed Today     Reviewed by Keiran Sias M, RN (Registered Nurse) on 07/29/24 at 1507  Med List Status: <None>   Medication Order Taking? Sig Documenting Provider Last Dose Status Informant  acetaminophen  (TYLENOL ) 500 MG tablet [id number] Yes Take 1,500 mg by mouth daily as needed for mild pain. [provider]  Active Self, Pharmacy Records  Alpha-D-Galactosidase [facility] PO) [id number] Yes Take 1 tablet by mouth at bedtime. [provider]  Active Self, Pharmacy Records  aspirin  81 MG EC tablet [id number] Yes Take 81 mg by mouth daily. [provider]  Active Self, Pharmacy Records  benazepril  (LOTENSIN ) 40 MG tablet [id number] Yes Take 40 mg by mouth daily. [provider]  Active   Cholecalciferol (VITAMIN D ) 50 MCG (2000 UT) CAPS [id number] Yes Take 2,000 Units by mouth at bedtime. [provider]  Active Self, Pharmacy Records  citalopram  (CELEXA ) 10 MG tablet [id number] Yes TAKE 1 TABLET BY MOUTH DAILY Norleen Lynwood ORN, MD  Active Self, Pharmacy Records  cyclobenzaprine  (FLEXERIL ) 5 MG tablet [id number] Yes TAKE 1 TABLET BY MOUTH 3 TIMES  DAILY AS NEEDED FOR  MUSCLE  SPASM(S)  Patient taking differently: TAKE 1 TABLET BY MOUTH 3 TIMES  DAILY AS NEEDED FOR MUSCLE  SPASM(S)   Norleen Lynwood ORN, MD  Active Self, Pharmacy Records  docusate sodium  (COLACE) 100 MG capsule [id number] Yes Take 100 mg by mouth daily as needed for mild constipation or moderate constipation.  [provider]  Active Self, Pharmacy Records  ezetimibe  (ZETIA ) 10 MG tablet [id number] Yes TAKE 1 TABLET BY MOUTH DAILY Norleen Lynwood ORN, MD  Active Self, Pharmacy Records  fenofibrate  160 MG tablet [id number]  TAKE 1 TABLET BY MOUTH AT  BEDTIME  Patient not taking: Reported on 07/29/2024   Norleen Lynwood ORN, MD  Active Self, Pharmacy Records  finasteride  (PROSCAR ) 5 MG tablet [id number] Yes TAKE 1 TABLET BY MOUTH DAILY Norleen Lynwood ORN, MD  Active   ibuprofen (ADVIL) 200 MG tablet [id number]  Take 600 mg by mouth 2 (two) times daily as needed for moderate pain.  Patient not taking: Reported on 07/29/2024   [provider]  Active Self, Pharmacy Records  metFORMIN  (GLUCOPHAGE ) 500 MG tablet [id number]  Take 500 mg by mouth daily with breakfast.  Patient not taking: Reported on 07/29/2024   [provider]  Active   methenamine  (HIPREX ) 1 g tablet [id number] Yes Take 1 tablet (1 g total) by mouth 2 (two) times daily. Webb, Padonda B, FNP  Active   metoprolol  succinate (TOPROL -XL) 50 MG 24 hr tablet [id number] Yes Take 1 tablet (50 mg total) by mouth daily. Take with or immediately following a meal. Norleen Lynwood ORN, MD  Active   Multiple Vitamin (MULTIVITAMIN WITH MINERALS) TABS tablet [id number] Yes Take 1 tablet by mouth daily. Earley Saucer, MD  Active Self, Pharmacy Records  oxybutynin  (DITROPAN -XL) 10 MG 24 hr tablet [id number] Yes Take 10 mg by mouth daily. [provider]  Active Self, Pharmacy Records  sodium chloride  (OCEAN) 0.65 % nasal spray [id number] Yes Place 1 spray into the nose daily as needed for congestion.  [provider]  Active Self, Pharmacy Records  terazosin  (HYTRIN ) 2 MG capsule [id number] Yes TAKE 1 CAPSULE BY MOUTH AT  BEDTIME Norleen Lynwood ORN, MD  Active   traZODone  (DESYREL ) 50 MG tablet [id number] Yes TAKE 1/2 TO 1 TABLET BY MOUTH AT BEDTIME AS NEEDED FOR SLEEP Norleen Lynwood ORN, MD  Active Self, Pharmacy Records          Recommendation:   PCP  Follow-up Continue Current Plan of Care  Follow Up Plan:   Telephone follow up appointment date/time:  08/31/24 at 3:00 pm  Heddy Shutter, RN, MSN, BSN, CCM [place]  [facility], Population Health Case Manager Phone: [phone]

## 2024-07-29 NOTE — Patient Instructions (Signed)
 Visit Information  Thank you for taking time to visit with me today. Please don't hesitate to contact me if I can be of assistance to you before our next scheduled appointment.  Your next care management appointment is by telephone on 08/31/24 at 3:00 pm   Please call the care guide team at [phone] if you need to cancel, schedule, or reschedule an appointment.   Please call the Suicide and Crisis Lifeline: 988 call the [place]  National Suicide Prevention Lifeline: (540)615-9869 or TTY: 713-203-3329 TTY (613)254-4928) to talk to a trained counselor if you are experiencing a Mental Health or Behavioral Health Crisis or need someone to talk to.  Heddy Shutter, RN, MSN, BSN, CCM [place]  [facility], Population Health Case Manager Phone: [phone]

## 2024-08-12 ENCOUNTER — Encounter: Payer: Self-pay | Admitting: Pharmacist

## 2024-08-12 NOTE — Progress Notes (Signed)
 Pharmacy Quality Measure Review  This patient is appearing on a report for being at risk of failing the adherence measure for diabetes medications this calendar year.   Medication: Metformin  Last fill date: 03/22/24 for 90 day supply  Metformin  discontinued after AKI and A1c well controlled. Will fail metric. No further action needed.  Darrelyn Drum, PharmD, BCPS, CPP Clinical Pharmacist Practitioner [place] [facility] at [facility] [phone]

## 2024-08-31 ENCOUNTER — Other Ambulatory Visit: Payer: Self-pay | Admitting: *Deleted

## 2024-08-31 NOTE — Patient Outreach (Addendum)
 Complex Care Management   Visit Note  08/31/2024  Name:  Neil [name] MRN: [id number] DOB: [birth year]  Situation: Referral received for Complex Care Management related to UTI/Falls I obtained verbal consent from Patient.  Visit completed with Patient  on the phone  Background:   Past Medical History:  Diagnosis Date   ALLERGIC RHINITIS 12/04/2007   Allergy    Anxiety    BENIGN PROSTATIC HYPERTROPHY 06/05/2007   BUNDLE BRANCH BLOCK, RIGHT 07/21/2009   BURSITIS, RIGHT SHOULDER 11/06/2010   Choledocholithiasis    CKD (chronic kidney disease) stage 3, GFR 30-59 ml/min (HCC) 08/02/2017   COLONIC POLYPS, HX OF 06/05/2007   tubular adenoma also 02/19/2013   Coronary artery calcification seen on CT scan 03/07/2016   Depression    DIABETES MELLITUS, TYPE II 12/04/2007   DIVERTICULOSIS, COLON 07/21/2009   ERECTILE DYSFUNCTION 06/05/2007   Gallstones    HYPERLIPIDEMIA 06/05/2007   on meds   HYPERTENSION 06/05/2007   on meds   Hypotension    INGUINAL HERNIA, RIGHT, SMALL 11/06/2010   Liver abscess 05/23/2015   SKIN LESION 06/03/2008   Ulcer     Assessment: Pt's PCP will be retiring and pt will request another provider within the same office however receptive to additional information on obtaining and PCP utilizing [facility] physician hotline [phone]. Will add to MyChart for patient review. Issues with phone lines as RNCM called pt twice for a better connection. Assessment completed with no additional needs or request.  Patient Reported Symptoms:  Cognitive Cognitive Status: Able to follow simple commands, Alert and oriented to person, place, and time, Normal speech and language skills   Health Maintenance Behaviors: Annual physical exam  Neurological Neurological Review of Symptoms: No symptoms reported    HEENT HEENT Symptoms Reported: No symptoms reported      Cardiovascular Cardiovascular Symptoms Reported: No symptoms reported Does patient have uncontrolled  Hypertension?: No Cardiovascular Management Strategies: Routine screening Weight: 157 lb (71.2 kg)  Respiratory Respiratory Symptoms Reported: No symptoms reported Respiratory Self-Management Outcome: 4 (good)  Endocrine Endocrine Symptoms Reported: No symptoms reported Is patient diabetic?: No Is patient checking blood sugars at home?: No    Gastrointestinal Gastrointestinal Symptoms Reported: No symptoms reported      Genitourinary Genitourinary Symptoms Reported: No symptoms reported Other Genitourinary Symptoms: History of suprapubic catheter that remains in place changed last week and routinely changed every 30 days. Educated on signs and symptoms and when to seek medical attention. Pt will f/u with a nephrologist ([facility]) referred pending an appt in Dec. Additional Genitourinary Details: Denies any symptoms of pain, irritation at the site location, discolored urine, cloudy or foul-smelling urine, blood in urine with no fever/chills or falls reported at this time. Continue to encouraged pt to follow up on all appointments related to his urologist/nephrologist and PCP. Genitourinary Management Strategies: Medication therapy, Coping strategies, Adequate rest, Medical device Genitourinary Self-Management Outcome: 4 (good)  Integumentary Integumentary Symptoms Reported: No symptoms reported    Musculoskeletal Musculoskelatal Symptoms Reviewed: No symptoms reported Additional Musculoskeletal Details: No falls and continue to uise his walker when needed. Denies any falls Musculoskeletal Management Strategies: Coping strategies, Medical device, Routine screening      Psychosocial Psychosocial Symptoms Reported: No symptoms reported Behavioral Management Strategies: Coping strategies        Today's Vitals   08/31/24 1517  Weight: 157 lb (71.2 kg)   Pain Scale: 0-10 Pain Score: 0-No pain  Medications Reviewed Today     Reviewed by Alvia Planas  D, RN (Registered Nurse) on  08/31/24 at 1511  Med List Status: <None>   Medication Order Taking? Sig Documenting Provider Last Dose Status Informant  acetaminophen  (TYLENOL ) 500 MG tablet [id number] Yes Take 1,500 mg by mouth daily as needed for mild pain. [provider]  Active Self, Pharmacy Records  Alpha-D-Galactosidase [facility] PO) [id number] Yes Take 1 tablet by mouth at bedtime. [provider]  Active Self, Pharmacy Records  aspirin  81 MG EC tablet [id number] Yes Take 81 mg by mouth daily. [provider]  Active Self, Pharmacy Records  benazepril  (LOTENSIN ) 40 MG tablet [id number] Yes Take 40 mg by mouth daily. [provider]  Active   Cholecalciferol (VITAMIN D ) 50 MCG (2000 UT) CAPS [id number] Yes Take 2,000 Units by mouth at bedtime. [provider]  Active Self, Pharmacy Records  citalopram  (CELEXA ) 10 MG tablet [id number] Yes TAKE 1 TABLET BY MOUTH DAILY Norleen Lynwood ORN, MD  Active Self, Pharmacy Records  cyclobenzaprine  (FLEXERIL ) 5 MG tablet [id number]  TAKE 1 TABLET BY MOUTH 3 TIMES  DAILY AS NEEDED FOR MUSCLE  SPASM(S)  Patient taking differently: TAKE 1 TABLET BY MOUTH 3 TIMES  DAILY AS NEEDED FOR MUSCLE  SPASM(S)   Norleen Lynwood ORN, MD  Active Self, Pharmacy Records  docusate sodium  (COLACE) 100 MG capsule [id number] Yes Take 100 mg by mouth daily as needed for mild constipation or moderate constipation. [provider]  Active Self, Pharmacy Records  ezetimibe  (ZETIA ) 10 MG tablet [id number] Yes TAKE 1 TABLET BY MOUTH DAILY Norleen Lynwood ORN, MD  Active Self, Pharmacy Records  fenofibrate  160 MG tablet [id number]  TAKE 1 TABLET BY MOUTH AT  BEDTIME  Patient not taking: Reported on 07/29/2024   Norleen Lynwood ORN, MD  Active Self, Pharmacy Records  finasteride  (PROSCAR ) 5 MG tablet [id number] Yes TAKE 1 TABLET BY MOUTH DAILY Norleen Lynwood ORN, MD  Active   ibuprofen (ADVIL) 200 MG tablet [id number]  Take 600 mg by mouth 2 (two) times daily as needed for moderate pain.  Patient not  taking: Reported on 07/29/2024   [provider]  Active Self, Pharmacy Records  metFORMIN  (GLUCOPHAGE ) 500 MG tablet [id number]  Take 500 mg by mouth daily with breakfast.  Patient not taking: Reported on 07/29/2024   [provider]  Active   methenamine  (HIPREX ) 1 g tablet [id number]  Take 1 tablet (1 g total) by mouth 2 (two) times daily. Webb, Padonda B, FNP  Active   metoprolol  succinate (TOPROL -XL) 50 MG 24 hr tablet [id number] Yes Take 1 tablet (50 mg total) by mouth daily. Take with or immediately following a meal. Norleen Lynwood ORN, MD  Active   Multiple Vitamin (MULTIVITAMIN WITH MINERALS) TABS tablet [id number] Yes Take 1 tablet by mouth daily. Rizwan, Saima, MD  Active Self, Pharmacy Records  oxybutynin  (DITROPAN -XL) 10 MG 24 hr tablet [id number] Yes Take 10 mg by mouth daily. [provider]  Active Self, Pharmacy Records  sodium chloride  (OCEAN) 0.65 % nasal spray [id number] Yes Place 1 spray into the nose daily as needed for congestion.  [provider]  Active Self, Pharmacy Records  terazosin  (HYTRIN ) 2 MG capsule [id number] Yes TAKE 1 CAPSULE BY MOUTH AT  BEDTIME Norleen Lynwood ORN, MD  Active   traZODone  (DESYREL ) 50 MG tablet [id number] Yes TAKE 1/2 TO 1 TABLET BY MOUTH AT BEDTIME AS NEEDED FOR SLEEP Norleen Lynwood ORN, MD  Active Self, Pharmacy Records  Recommendation:   PCP Follow-up Specialty provider follow-up Dec 2025 nephrologist Continue Current Plan of Care  Follow Up Plan:   Telephone follow up appointment date/time:  09/30/2024 @ 3:00 pm   Olam Ku, RN, BSN [place]  [facility], [facility] RN Care Manager Direct Dial: [phone]  Fax: [phone]

## 2024-08-31 NOTE — Patient Instructions (Signed)
 Visit Information  Thank you for taking time to visit with me today. Please don't hesitate to contact me if I can be of assistance to you before our next scheduled appointment.  Your next care management appointment is by telephone on 09/30/2024 at 3:00 pm  Please call the care guide team at [phone] if you need to cancel, schedule, or reschedule an appointment.   Physicians Hotline through [facility] [phone].  Please call the Suicide and Crisis Lifeline: 988 call the [place]  National Suicide Prevention Lifeline: 989-065-9126 or TTY: 512-527-8707 TTY 779 760 3658) to talk to a trained counselor call 1-800-273-TALK (toll free, 24 hour hotline) if you are experiencing a Mental Health or Behavioral Health Crisis or need someone to talk to.   Olam Ku, RN, BSN [place]  [facility], [facility] RN Care Manager Direct Dial: [phone]  Fax: [phone]

## 2024-09-04 ENCOUNTER — Ambulatory Visit: Payer: 59

## 2024-09-04 VITALS — Ht 68.0 in | Wt 157.0 lb

## 2024-09-04 DIAGNOSIS — Z0000 Encounter for general adult medical examination without abnormal findings: Secondary | ICD-10-CM | POA: Diagnosis not present

## 2024-09-04 NOTE — Progress Notes (Signed)
 Chief Complaint  Patient presents with   Medicare Wellness     Subjective:   Neil [name] is a 83 y.o. male who presents for a Medicare Annual Wellness Visit.  Allergies (verified) Bactrim [sulfamethoxazole-trimethoprim], Statins, and Zanaflex  [tizanidine ]   History: Past Medical History:  Diagnosis Date   ALLERGIC RHINITIS 12/04/2007   Allergy    Anxiety    BENIGN PROSTATIC HYPERTROPHY 06/05/2007   BUNDLE BRANCH BLOCK, RIGHT 07/21/2009   BURSITIS, RIGHT SHOULDER 11/06/2010   Choledocholithiasis    CKD (chronic kidney disease) stage 3, GFR 30-59 ml/min (HCC) 08/02/2017   COLONIC POLYPS, HX OF 06/05/2007   tubular adenoma also 02/19/2013   Coronary artery calcification seen on CT scan 03/07/2016   Depression    DIABETES MELLITUS, TYPE [name] 12/04/2007   DIVERTICULOSIS, COLON 07/21/2009   ERECTILE DYSFUNCTION 06/05/2007   Gallstones    HYPERLIPIDEMIA 06/05/2007   on meds   HYPERTENSION 06/05/2007   on meds   Hypotension    INGUINAL HERNIA, RIGHT, SMALL 11/06/2010   Liver abscess 05/23/2015   SKIN LESION 06/03/2008   Ulcer    Past Surgical History:  Procedure Laterality Date   BIOPSY  09/08/2021   Procedure: BIOPSY;  Surgeon: Eda Iha, MD;  Location: [facility];  Service: Endoscopy;;   CHOLECYSTECTOMY N/A 03/02/2015   Procedure: LAPAROSCOPIC CHOLECYSTECTOMY;  Surgeon: Elon Pacini, MD;  Location: [facility];  Service: General;  Laterality: N/A;   COLONOSCOPY  2017   JP-MAC-suprep (good)-TA   CYSTOSCOPY N/A 05/29/2022   Procedure: PHYLLIS;  Surgeon: Rosalind Zachary NOVAK, MD;  Location: [facility];  Service: Urology;  Laterality: N/A;   ERCP N/A 03/01/2015   Procedure: ENDOSCOPIC RETROGRADE CHOLANGIOPANCREATOGRAPHY (ERCP);  Surgeon: Norleen LOISE Kiang, MD;  Location: [facility];  Service: Endoscopy;  Laterality: N/A;  Dr Pacini wants patient to stay overnight after ERCP   ERCP N/A 05/03/2015   Procedure: ENDOSCOPIC RETROGRADE CHOLANGIOPANCREATOGRAPHY (ERCP);   Surgeon: Norleen LOISE Kiang, MD;  Location: [facility];  Service: Endoscopy;  Laterality: N/A;   FINGER SURGERY Left    little finger   FLEXIBLE SIGMOIDOSCOPY N/A 09/08/2021   Procedure: FLEXIBLE SIGMOIDOSCOPY;  Surgeon: Eda Iha, MD;  Location: [facility];  Service: Endoscopy;  Laterality: N/A;   FRACTURE SURGERY     HEMORRHOID SURGERY  1973   INGUINAL HERNIA REPAIR Right 2014   8'14 repair   INSERTION OF SUPRAPUBIC CATHETER N/A 05/29/2022   Procedure: INSERTION OF SUPRAPUBIC CATHETER;  Surgeon: Rosalind Zachary NOVAK, MD;  Location: [facility];  Service: Urology;  Laterality: N/A;  30 MINS   POLYPECTOMY     TONSILLECTOMY     WISDOM TOOTH EXTRACTION  1972   Family History  Problem Relation Age of Onset   Colon polyps Mother 16   Colon cancer Mother 29   Brain cancer Mother 8       mets from colon cancer   Colon cancer Paternal Uncle    Colon polyps Paternal Uncle    Rectal cancer Neg Hx    Stomach cancer Neg Hx    Esophageal cancer Neg Hx    Social History   Occupational History   Occupation: retired  Tobacco Use   Smoking status: Former    Current packs/day: 0.00    Types: Cigarettes    Quit date: 2008    Years since quitting: 17.9   Smokeless tobacco: Never  Vaping Use   Vaping status: Never Used  Substance and Sexual Activity   Alcohol use: Not Currently  Comment: occ beer but not in years   Drug use: No   Sexual activity: Not Currently   Tobacco Counseling Counseling given: Not Answered  SDOH Screenings   Food Insecurity: No Food Insecurity (09/03/2024)  Housing: Unknown (09/03/2024)  Transportation Needs: No Transportation Needs (09/03/2024)  Utilities: Not At Risk (08/31/2024)  Alcohol Screen: Low Risk  (08/27/2023)  Depression (PHQ2-9): Low Risk  (09/04/2024)  Financial Resource Strain: Low Risk  (09/03/2024)  Physical Activity: Inactive (09/03/2024)  Social Connections: Socially Isolated (09/03/2024)  Stress: No Stress Concern Present  (09/03/2024)  Tobacco Use: Medium Risk (09/04/2024)  Health Literacy: Adequate Health Literacy (08/30/2023)   See flowsheets for full screening details  Depression Screen PHQ 2 & 9 Depression Scale- Over the past 2 weeks, how often have you been bothered by any of the following problems? Little interest or pleasure in doing things: 0 Feeling down, depressed, or hopeless (PHQ Adolescent also includes...irritable): 0 PHQ-2 Total Score: 0 Trouble falling or staying asleep, or sleeping too much: 0 Feeling tired or having little energy: 0 Poor appetite or overeating (PHQ Adolescent also includes...weight loss): 0 Feeling bad about yourself - or that you are a failure or have let yourself or your family down: 0 Trouble concentrating on things, such as reading the newspaper or watching television (PHQ Adolescent also includes...like school work): 0 Moving or speaking so slowly that other people could have noticed. Or the opposite - being so fidgety or restless that you have been moving around a lot more than usual: 0 Thoughts that you would be better off dead, or of hurting yourself in some way: 0 PHQ-9 Total Score: 0 If you checked off any problems, how difficult have these problems made it for you to do your work, take care of things at home, or get along with other people?: Not difficult at all  Depression Treatment Depression Interventions/Treatment : EYV7-0 Score <4 Follow-up Not Indicated     Goals Addressed   None    Visit info / Clinical Intake: Medicare Wellness Visit Type:: Subsequent Annual Wellness Visit Persons participating in visit:: patient Medicare Wellness Visit Mode:: Video Because this visit was a virtual/telehealth visit:: vitals recorded from last visit If Telephone or Video please confirm:: I connected with the patient using audio enabled telemedicine application and verified that I am speaking with the correct person using two identifiers; I discussed the  limitations of evaluation and management by telemedicine; The patient expressed understanding and agreed to proceed Patient Location:: Home Provider Location:: Office Information given by:: patient Interpreter Needed?: No Pre-visit prep was completed: yes AWV questionnaire completed by patient prior to visit?: yes Date:: 08/31/24 Living arrangements:: (!) lives alone Patient's Overall Health Status Rating: (!) fair Typical amount of pain: none Does pain affect daily life?: no Are you currently prescribed opioids?: no  Dietary Habits and Nutritional Risks How many meals a day?: 2 Eats fruit and vegetables daily?: (!) no Most meals are obtained by: preparing own meals In the last 2 weeks, have you had any of the following?: none Diabetic:: (!) yes Any non-healing wounds?: no How often do you check your BS?: 0 Would you like to be referred to a Nutritionist or for Diabetic Management? : no  Functional Status Activities of Daily Living (to include ambulation/medication): (Patient-Rptd) Independent Ambulation: Independent with device- listed below Home Assistive Devices/Equipment: Walker (specify Type); Eyeglasses Medication Administration: Independent Home Management: (Patient-Rptd) Independent Manage your own finances?: yes Primary transportation is: driving Concerns about vision?: no *vision screening is  required for WTM* Concerns about hearing?: no  Fall Screening Falls in the past year?: (Patient-Rptd) 1 Number of falls in past year: 1 Was there an injury with Fall?: (Patient-Rptd) 0 Fall Risk Category Calculator: 2 Patient Fall Risk Level: Moderate Fall Risk  Fall Risk Patient at Risk for Falls Due to: Impaired balance/gait Fall risk Follow up: Falls evaluation completed; Falls prevention discussed  Home and Transportation Safety: All stairs or steps have railings?: N/A, no stairs Grab bars in the bathtub or shower?: yes Have non-skid surface in bathtub or shower?:  yes Good home lighting?: yes Regular seat belt use?: yes Hospital stays in the last year:: (!) yes How many hospital stays:: 6 Reason: due to a fall/UTI  Cognitive Assessment Difficulty concentrating, remembering, or making decisions? : no Will 6CIT or Mini Cog be Completed: no 6CIT or Mini Cog Declined: patient alert, oriented, able to answer questions appropriately and recall recent events  Advance Directives (For Healthcare) Does Patient Have a Medical Advance Directive?: Yes Type of Advance Directive: Healthcare Power of [place]; Living will Copy of Healthcare Power of Attorney in Chart?: Yes - validated most recent copy scanned in chart (See row information) Copy of Living Will in Chart?: Yes - validated most recent copy scanned in chart (See row information) Would patient like information on creating a medical advance directive?: No - Patient declined  Reviewed/Updated  Reviewed/Updated: Reviewed All (Medical, Surgical, Family, Medications, Allergies, Care Teams, Patient Goals)        Objective:    Today's Vitals   09/04/24 1451  Weight: 157 lb (71.2 kg)  Height: 5' 8 (1.727 m)   Body mass index is 23.87 kg/m.  Current Medications (verified) Outpatient Encounter Medications as of 09/04/2024  Medication Sig   acetaminophen  (TYLENOL ) 500 MG tablet Take 1,500 mg by mouth daily as needed for mild pain.   Alpha-D-Galactosidase (BEANO PO) Take 1 tablet by mouth at bedtime.   aspirin  81 MG EC tablet Take 81 mg by mouth daily.   benazepril  (LOTENSIN ) 40 MG tablet Take 40 mg by mouth daily.   Cholecalciferol (VITAMIN D ) 50 MCG (2000 UT) CAPS Take 2,000 Units by mouth at bedtime.   citalopram  (CELEXA ) 10 MG tablet TAKE 1 TABLET BY MOUTH DAILY   cyclobenzaprine  (FLEXERIL ) 5 MG tablet TAKE 1 TABLET BY MOUTH 3 TIMES  DAILY AS NEEDED FOR MUSCLE  SPASM(S) (Patient taking differently: TAKE 1 TABLET BY MOUTH 3 TIMES  DAILY AS NEEDED FOR MUSCLE  SPASM(S))   docusate sodium  (COLACE)  100 MG capsule Take 100 mg by mouth daily as needed for mild constipation or moderate constipation.   ezetimibe  (ZETIA ) 10 MG tablet TAKE 1 TABLET BY MOUTH DAILY   finasteride  (PROSCAR ) 5 MG tablet TAKE 1 TABLET BY MOUTH DAILY   methenamine  (HIPREX ) 1 g tablet Take 1 tablet (1 g total) by mouth 2 (two) times daily.   metoprolol  succinate (TOPROL -XL) 50 MG 24 hr tablet Take 1 tablet (50 mg total) by mouth daily. Take with or immediately following a meal.   Multiple Vitamin (MULTIVITAMIN WITH MINERALS) TABS tablet Take 1 tablet by mouth daily.   oxybutynin  (DITROPAN -XL) 10 MG 24 hr tablet Take 10 mg by mouth daily.   sodium chloride  (OCEAN) 0.65 % nasal spray Place 1 spray into the nose daily as needed for congestion.    terazosin  (HYTRIN ) 2 MG capsule TAKE 1 CAPSULE BY MOUTH AT  BEDTIME   traZODone  (DESYREL ) 50 MG tablet TAKE 1/2 TO 1 TABLET BY MOUTH  AT BEDTIME AS NEEDED FOR SLEEP   fenofibrate  160 MG tablet TAKE 1 TABLET BY MOUTH AT  BEDTIME (Patient not taking: Reported on 09/04/2024)   ibuprofen (ADVIL) 200 MG tablet Take 600 mg by mouth 2 (two) times daily as needed for moderate pain. (Patient not taking: Reported on 09/04/2024)   metFORMIN  (GLUCOPHAGE ) 500 MG tablet Take 500 mg by mouth daily with breakfast. (Patient not taking: Reported on 09/04/2024)   No facility-administered encounter medications on file as of 09/04/2024.   Hearing/Vision screen Hearing Screening - Comments:: Denies hearing difficulties   Vision Screening - Comments:: Wears eyeglasses/UTD/Dr. [employer] Immunizations and Health Maintenance Health Maintenance  Topic Date Due   Medicare Annual Wellness (AWV)  08/29/2024   HEMOGLOBIN A1C  12/30/2024   OPHTHALMOLOGY EXAM  05/16/2025   Diabetic kidney evaluation - eGFR measurement  07/02/2025   Diabetic kidney evaluation - Urine ACR  07/02/2025   FOOT EXAM  07/15/2025   Colonoscopy  07/06/2026   DTaP/Tdap/Td (3 - Td or Tdap) 01/17/2032   Pneumococcal Vaccine: 50+  Years  Completed   Influenza Vaccine  Completed   Zoster Vaccines- Shingrix  Completed   Meningococcal B Vaccine  Aged Out   COVID-19 Vaccine  Discontinued        Assessment/Plan:  This is a routine wellness examination for Neil [name].  Patient Care Team: Norleen Lynwood ORN, MD as PCP - Diedre Kiang, Norleen SAILOR, MD as Consulting Physician (Gastroenterology) Prentiss Heddy HERO, RN as [facility] Care Management  I have personally reviewed and noted the following in the patient's chart:   Medical and social history Use of alcohol, tobacco or illicit drugs  Current medications and supplements including opioid prescriptions. Functional ability and status Nutritional status Physical activity Advanced directives List of other physicians Hospitalizations, surgeries, and ER visits in previous 12 months Vitals Screenings to include cognitive, depression, and falls Referrals and appointments  No orders of the defined types were placed in this encounter.  In addition, I have reviewed and discussed with patient certain preventive protocols, quality metrics, and best practice recommendations. A written personalized care plan for preventive services as well as general preventive health recommendations were provided to patient.   Ashmi Blas L Braileigh Landenberger, CMA   09/04/2024   No follow-ups on file.  After Visit Summary: (MyChart) Due to this being a telephonic visit, the after visit summary with patients personalized plan was offered to patient via MyChart   Nurse Notes: Patient is up to date on all health maintenance with no concerns to address today.

## 2024-09-04 NOTE — Patient Instructions (Signed)
 Mr. Neil [name],  Thank you for taking the time for your Medicare Wellness Visit. I appreciate your continued commitment to your health goals. Please review the care plan we discussed, and feel free to reach out if I can assist you further.  Please note that Annual Wellness Visits do not include a physical exam. Some assessments may be limited, especially if the visit was conducted virtually. If needed, we may recommend an in-person follow-up with your provider.  Ongoing Care Seeing your primary care provider every 3 to 6 months helps us  monitor your health and provide consistent, personalized care. Last office visit on 07/15/2024.  Each day, aim for 6 glasses of water , plenty of protein in your diet and try to get up and walk/ stretch every hour for 5-10 minutes at a time.    Referrals If a referral was made during today's visit and you haven't received any updates within two weeks, please contact the referred provider directly to check on the status.  Recommended Screenings:  Health Maintenance  Topic Date Due   Hemoglobin A1C  12/30/2024   Eye exam for diabetics  05/16/2025   Yearly kidney function blood test for diabetes  07/02/2025   Yearly kidney health urinalysis for diabetes  07/02/2025   Complete foot exam   07/15/2025   Medicare Annual Wellness Visit  09/04/2025   Colon Cancer Screening  07/06/2026   DTaP/Tdap/Td vaccine (3 - Td or Tdap) 01/17/2032   Pneumococcal Vaccine for age over 85  Completed   Flu Shot  Completed   Zoster (Shingles) Vaccine  Completed   Meningitis B Vaccine  Aged Out   COVID-19 Vaccine  Discontinued       08/31/2024   10:31 AM  Advanced Directives  Does Patient Have a Medical Advance Directive? Yes  Type of Estate Agent of [place];Living will  Copy of Healthcare Power of Attorney in Chart? Yes - validated most recent copy scanned in chart (See row information)    Vision: Annual vision screenings are recommended for early  detection of glaucoma, cataracts, and diabetic retinopathy. These exams can also reveal signs of chronic conditions such as diabetes and high blood pressure.  Dental: Annual dental screenings help detect early signs of oral cancer, gum disease, and other conditions linked to overall health, including heart disease and diabetes.  Please see the attached documents for additional preventive care recommendations.

## 2024-09-25 ENCOUNTER — Other Ambulatory Visit: Payer: Self-pay | Admitting: Internal Medicine

## 2024-09-25 ENCOUNTER — Other Ambulatory Visit: Payer: Self-pay | Admitting: Family

## 2024-09-30 ENCOUNTER — Other Ambulatory Visit

## 2024-09-30 NOTE — Patient Outreach (Signed)
 Complex Care Management   Visit Note  09/30/2024  Name:  Neil [name] MRN: [id number] DOB: 15-Apr-1941  Situation: Referral received for Complex Care Management related to UTI I obtained verbal consent from Patient.  Visit completed with Patient  on the phone  Background:   Past Medical History:  Diagnosis Date   ALLERGIC RHINITIS 12/04/2007   Allergy    Anxiety    BENIGN PROSTATIC HYPERTROPHY 06/05/2007   BUNDLE BRANCH BLOCK, RIGHT 07/21/2009   BURSITIS, RIGHT SHOULDER 11/06/2010   Choledocholithiasis    CKD (chronic kidney disease) stage 3, GFR 30-59 ml/min (HCC) 08/02/2017   COLONIC POLYPS, HX OF 06/05/2007   tubular adenoma also 02/19/2013   Coronary artery calcification seen on CT scan 03/07/2016   Depression    DIABETES MELLITUS, TYPE II 12/04/2007   DIVERTICULOSIS, COLON 07/21/2009   ERECTILE DYSFUNCTION 06/05/2007   Gallstones    HYPERLIPIDEMIA 06/05/2007   on meds   HYPERTENSION 06/05/2007   on meds   Hypotension    INGUINAL HERNIA, RIGHT, SMALL 11/06/2010   Liver abscess 05/23/2015   SKIN LESION 06/03/2008   Ulcer     Assessment: Patient Reported Symptoms:  Cognitive Cognitive Status: No symptoms reported, Alert and oriented to person, place, and time      Neurological Neurological Review of Symptoms: No symptoms reported    HEENT HEENT Symptoms Reported: No symptoms reported      Cardiovascular Cardiovascular Symptoms Reported: No symptoms reported Does patient have uncontrolled Hypertension?: Yes Is patient checking Blood Pressure at home?: Yes Patient's Recent BP reading at home: patient reports completed nephrology visit on 09/15/24 with Dr. Melia. Instructed by Dr. Melia to check/record BP 2-3 times/week and bring about a months readings to nephrologist office. Patient reports BP last night 09/29/24 188/68 HR 48. Patient rechecked today 09/30/24 at 4:00 pm and it was 172/72 HR 56, rechecked about 15 minutes later BP 165/69 HR 54. Cardiovascular  Management Strategies: Routine screening, Medication therapy, Adequate rest  Respiratory Respiratory Symptoms Reported: No symptoms reported    Endocrine Endocrine Symptoms Reported: No symptoms reported Is patient diabetic?: No    Gastrointestinal Gastrointestinal Symptoms Reported: No symptoms reported      Genitourinary Genitourinary Symptoms Reported: No symptoms reported Other Genitourinary Symptoms: suprapubic catheter last changed 09/23/24    Integumentary Integumentary Symptoms Reported: No symptoms reported    Musculoskeletal Musculoskelatal Symptoms Reviewed: No symptoms reported        Psychosocial Psychosocial Symptoms Reported: No symptoms reported          Today's Vitals   09/30/24 1535  BP: (!) 165/69  Pulse: (!) 54      Medications Reviewed Today     Reviewed by Johnnathan Hagemeister M, RN (Registered Nurse) on 09/30/24 at 1534  Med List Status: <None>   Medication Order Taking? Sig Documenting Provider Last Dose Status Informant  acetaminophen  (TYLENOL ) 500 MG tablet [id number] Yes Take 1,500 mg by mouth daily as needed for mild pain. [provider]  Active Self, Pharmacy Records  Alpha-D-Galactosidase [facility] PO) [id number] Yes Take 1 tablet by mouth at bedtime. [provider]  Active Self, Pharmacy Records  aspirin  81 MG EC tablet [id number] Yes Take 81 mg by mouth daily. [provider]  Active Self, Pharmacy Records  benazepril  (LOTENSIN ) 40 MG tablet [id number] Yes Take 40 mg by mouth daily. [provider]  Active   Cholecalciferol (VITAMIN D ) 50 MCG (2000 UT) CAPS [id number] Yes Take 2,000 Units by mouth at bedtime. [provider]  Active Self, Pharmacy Records  citalopram  (CELEXA ) 10 MG tablet [id number] Yes TAKE 1 TABLET BY MOUTH DAILY Norleen Lynwood ORN, MD  Active Self, Pharmacy Records  cyclobenzaprine  (FLEXERIL ) 5 MG tablet [id number] Yes TAKE 1 TABLET BY MOUTH 3 TIMES  DAILY AS NEEDED FOR MUSCLE  SPASM(S) Norleen Lynwood ORN, MD  Active Self, Pharmacy Records  docusate sodium  (COLACE) 100 MG capsule [id number] Yes Take 100 mg by mouth daily as needed for mild constipation or moderate constipation. [provider]  Active Self, Pharmacy Records  ezetimibe  (ZETIA ) 10 MG tablet [id number] Yes TAKE 1 TABLET BY MOUTH DAILY Norleen Lynwood ORN, MD  Active Self, Pharmacy Records  fenofibrate  160 MG tablet [id number] Yes TAKE 1 TABLET BY MOUTH AT  BEDTIME Norleen Lynwood ORN, MD  Active Self, Pharmacy Records  finasteride  (PROSCAR ) 5 MG tablet [id number] Yes TAKE 1 TABLET BY MOUTH DAILY Norleen Lynwood ORN, MD  Active   ibuprofen (ADVIL) 200 MG tablet [id number] Yes Take 600 mg by mouth 2 (two) times daily as needed for moderate pain. [provider]  Active Self, Pharmacy Records  metFORMIN  (GLUCOPHAGE ) 500 MG tablet [id number] Yes Take 500 mg by mouth daily with breakfast. [provider]  Active   methenamine  (HIPREX ) 1 g tablet [id number] Yes TAKE 1 TABLET BY MOUTH TWICE  DAILY Norleen Lynwood ORN, MD  Active   metoprolol  succinate (TOPROL -XL) 50 MG 24 hr tablet [id number] Yes Take 1 tablet (50 mg total) by mouth daily. Take with or immediately following a meal. Norleen Lynwood ORN, MD  Active   Multiple Vitamin (MULTIVITAMIN WITH MINERALS) TABS tablet [id number] Yes Take 1 tablet by mouth daily. Rizwan, Saima, MD  Active Self, Pharmacy Records  oxybutynin  (DITROPAN -XL) 10 MG 24 hr tablet [id number] Yes Take 10 mg by mouth daily. [provider]  Active Self, Pharmacy Records  sodium chloride  (OCEAN) 0.65 % nasal spray [id number] Yes Place 1 spray into the nose daily as needed for congestion.  [provider]  Active Self, Pharmacy Records  terazosin  (HYTRIN ) 2 MG capsule [id number] Yes TAKE 1 CAPSULE BY MOUTH AT  BEDTIME Norleen Lynwood ORN, MD  Active   traZODone  (DESYREL ) 50 MG tablet [id number] Yes TAKE 1/2 TO 1 TABLET BY MOUTH AT BEDTIME AS NEEDED FOR SLEEP Norleen Lynwood ORN, MD  Active           Recommendation:   Continue  Current Plan of Care Patient to continue to monitor BP/HR and notify provider with questions/concerns or if outside recommended range.  Follow Up Plan:   Telephone follow up appointment date/time:  11/03/23 at 3:00 pm  Heddy Shutter, RN, MSN, BSN, CCM [place]  [facility], Population Health Case Manager Phone: [phone]

## 2024-09-30 NOTE — Patient Instructions (Signed)
 Visit Information  Thank you for taking time to visit with me today. Please don't hesitate to contact me if I can be of assistance to you before our next scheduled appointment.  Your next care management appointment is by telephone on 10/30/24 at 3:00 pm  Please call the care guide team at [phone] if you need to cancel, schedule, or reschedule an appointment.   Please call the Suicide and Crisis Lifeline: 988 call the [place]  National Suicide Prevention Lifeline: 716-338-9509 or TTY: 651 874 0312 TTY 610-033-3842) to talk to a trained counselor if you are experiencing a Mental Health or Behavioral Health Crisis or need someone to talk to.  Heddy Shutter, RN, MSN, BSN, CCM [place]  [facility], Population Health Case Manager Phone: [phone]   Hypertension Hypertension is another name for high blood pressure. High blood pressure forces your heart to work harder to pump blood. This can cause problems over time. There are two numbers in a blood pressure reading. There is a top number (systolic) over a bottom number (diastolic). It is best to have a blood pressure that is below 120/80. What are the causes? The cause of this condition is not known. Some other conditions can lead to high blood pressure. What increases the risk? Some lifestyle factors can make you more likely to develop high blood pressure: Smoking. Not getting enough exercise or physical activity. Being overweight. Having too much fat, sugar, calories, or salt (sodium) in your diet. Drinking too much alcohol. Other risk factors include: Having any of these conditions: Heart disease. Diabetes. High cholesterol. Kidney disease. Obstructive sleep apnea. Having a family history of high blood pressure and high cholesterol. Age. The risk increases with age. Stress. What are the signs or symptoms? High blood pressure may not cause symptoms. Very high blood pressure (hypertensive crisis) may  cause: Headache. Fast or uneven heartbeats (palpitations). Shortness of breath. Nosebleed. Vomiting or feeling like you may vomit (nauseous). Changes in how you see. Very bad chest pain. Feeling dizzy. Seizures. How is this treated? This condition is treated by making healthy lifestyle changes, such as: Eating healthy foods. Exercising more. Drinking less alcohol. Your doctor may prescribe medicine if lifestyle changes do not help enough and if: Your top number is above 130. Your bottom number is above 80. Your personal target blood pressure may vary. Follow these instructions at home: Eating and drinking  If told, follow the DASH eating plan. To follow this plan: Fill one half of your plate at each meal with fruits and vegetables. Fill one fourth of your plate at each meal with whole grains. Whole grains include whole-wheat pasta, brown rice, and whole-grain bread. Eat or drink low-fat dairy products, such as skim milk or low-fat yogurt. Fill one fourth of your plate at each meal with low-fat (lean) proteins. Low-fat proteins include fish, chicken without skin, eggs, beans, and tofu. Avoid fatty meat, cured and processed meat, or chicken with skin. Avoid pre-made or processed food. Limit the amount of salt in your diet to less than 1,500 mg each day. Do not drink alcohol if: Your doctor tells you not to drink. You are pregnant, may be pregnant, or are planning to become pregnant. If you drink alcohol: Limit how much you have to: 0-1 drink a day for women. 0-2 drinks a day for men. Know how much alcohol is in your drink. In the [place] one drink equals one 12 oz bottle of beer (355 mL), one 5 oz glass of wine (148 mL), or  one 1 oz glass of hard liquor (44 mL). Lifestyle  Work with your doctor to stay at a healthy weight or to lose weight. Ask your doctor what the best weight is for you. Get at least 30 minutes of exercise that causes your heart to beat faster (aerobic  exercise) most days of the week. This may include walking, swimming, or biking. Get at least 30 minutes of exercise that strengthens your muscles (resistance exercise) at least 3 days a week. This may include lifting weights or doing Pilates. Do not smoke or use any products that contain nicotine or tobacco. If you need help quitting, ask your doctor. Check your blood pressure at home as told by your doctor. Keep all follow-up visits. Medicines Take over-the-counter and prescription medicines only as told by your doctor. Follow directions carefully. Do not skip doses of blood pressure medicine. The medicine does not work as well if you skip doses. Skipping doses also puts you at risk for problems. Ask your doctor about side effects or reactions to medicines that you should watch for. Contact a doctor if: You think you are having a reaction to the medicine you are taking. You have headaches that keep coming back. You feel dizzy. You have swelling in your ankles. You have trouble with your vision. Get help right away if: You get a very bad headache. You start to feel mixed up (confused). You feel weak or numb. You feel faint. You have very bad pain in your: Chest. Belly (abdomen). You vomit more than once. You have trouble breathing. These symptoms may be an emergency. Get help right away. Call 911. Do not wait to see if the symptoms will go away. Do not drive yourself to the hospital. Summary Hypertension is another name for high blood pressure. High blood pressure forces your heart to work harder to pump blood. For most people, a normal blood pressure is less than 120/80. Making healthy choices can help lower blood pressure. If your blood pressure does not get lower with healthy choices, you may need to take medicine. This information is not intended to replace advice given to you by your health care provider. Make sure you discuss any questions you have with your health care  provider. Document Revised: 07/20/2021 Document Reviewed: 07/20/2021 Elsevier Patient Education  2024 [employer].

## 2024-11-01 ENCOUNTER — Other Ambulatory Visit: Payer: Self-pay | Admitting: Internal Medicine

## 2024-11-02 ENCOUNTER — Other Ambulatory Visit: Payer: Self-pay

## 2024-11-02 NOTE — Patient Outreach (Signed)
 Complex Care Management   Visit Note  11/02/2024  Name:  Neil [name] MRN: [id number] DOB: Dec 11, 1940  Situation: Referral received for Complex Care Management related to UTI/Falls I obtained verbal consent from Patient.  Visit completed with Patient  on the phone  Background:   Past Medical History:  Diagnosis Date   ALLERGIC RHINITIS 12/04/2007   Allergy    Anxiety    BENIGN PROSTATIC HYPERTROPHY 06/05/2007   BUNDLE BRANCH BLOCK, RIGHT 07/21/2009   BURSITIS, RIGHT SHOULDER 11/06/2010   Choledocholithiasis    CKD (chronic kidney disease) stage 3, GFR 30-59 ml/min (HCC) 08/02/2017   COLONIC POLYPS, HX OF 06/05/2007   tubular adenoma also 02/19/2013   Coronary artery calcification seen on CT scan 03/07/2016   Depression    DIABETES MELLITUS, TYPE II 12/04/2007   DIVERTICULOSIS, COLON 07/21/2009   ERECTILE DYSFUNCTION 06/05/2007   Gallstones    HYPERLIPIDEMIA 06/05/2007   on meds   HYPERTENSION 06/05/2007   on meds   Hypotension    INGUINAL HERNIA, RIGHT, SMALL 11/06/2010   Liver abscess 05/23/2015   SKIN LESION 06/03/2008   Ulcer     Assessment: Patient Reported Symptoms:  Cognitive Cognitive Status: No symptoms reported      Neurological Neurological Review of Symptoms: No symptoms reported    HEENT HEENT Symptoms Reported: No symptoms reported      Cardiovascular Cardiovascular Symptoms Reported: No symptoms reported Does patient have uncontrolled Hypertension?: Yes Is patient checking Blood Pressure at home?: Yes Patient's Recent BP reading at home: denies any signs/symptoms of volume overload. Weight: 162 lb (73.5 kg)  Respiratory Respiratory Symptoms Reported: No symptoms reported    Endocrine Endocrine Symptoms Reported: No symptoms reported Is patient diabetic?: No    Gastrointestinal Gastrointestinal Symptoms Reported: No symptoms reported      Genitourinary Other Genitourinary Symptoms: suprabupic cath changed due first week of February.  denies any problems    Integumentary Integumentary Symptoms Reported: No symptoms reported    Musculoskeletal Musculoskelatal Symptoms Reviewed: No symptoms reported Additional Musculoskeletal Details: uses Walker primarily. Musculoskeletal Management Strategies: Routine screening, Coping strategies, Adequate rest, Activity, Medication therapy Falls in the past year?: No    Psychosocial Psychosocial Symptoms Reported: No symptoms reported         Today's Vitals   11/02/24 1535  Weight: 162 lb (73.5 kg)   Pain Scale: 0-10 Pain Score: 0-No pain  Medications Reviewed Today   Medications were not reviewed in this encounter   Recommendation:   PCP Follow-up Specialty provider follow-up [facility]  Continue Current Plan of Care  Follow Up Plan:   Telephone follow up appointment date/time:  11/20/24 at 1:30 pm  Heddy Shutter, RN, MSN, BSN, CCM [place]  [facility], Population Health Case Manager Phone: [phone]

## 2024-11-02 NOTE — Patient Instructions (Signed)
 Visit Information  Thank you for taking time to visit with me today. Please don't hesitate to contact me if I can be of assistance to you before our next scheduled appointment.  Your next care management appointment is by telephone on 11/20/24 at 1:30 pm   Please call the care guide team at [phone] if you need to cancel, schedule, or reschedule an appointment.   Please call the Suicide and Crisis Lifeline: 988 call the [place]  National Suicide Prevention Lifeline: 639-631-7919 or TTY: 928-426-9816 TTY 660-042-2235) to talk to a trained counselor if you are experiencing a Mental Health or Behavioral Health Crisis or need someone to talk to.  Heddy Shutter, RN, MSN, BSN, CCM [place]  [facility], Population Health Case Manager Phone: [phone]

## 2024-11-11 ENCOUNTER — Other Ambulatory Visit (INDEPENDENT_AMBULATORY_CARE_PROVIDER_SITE_OTHER)

## 2024-11-11 DIAGNOSIS — E559 Vitamin D deficiency, unspecified: Secondary | ICD-10-CM | POA: Diagnosis not present

## 2024-11-11 DIAGNOSIS — E538 Deficiency of other specified B group vitamins: Secondary | ICD-10-CM

## 2024-11-11 DIAGNOSIS — E119 Type 2 diabetes mellitus without complications: Secondary | ICD-10-CM

## 2024-11-11 LAB — BASIC METABOLIC PANEL WITH GFR
BUN: 20 mg/dL (ref 6–23)
CO2: 28 meq/L (ref 19–32)
Calcium: 9.5 mg/dL (ref 8.4–10.5)
Chloride: 103 meq/L (ref 96–112)
Creatinine, Ser: 1.31 mg/dL (ref 0.40–1.50)
GFR: 50.68 mL/min — ABNORMAL LOW
Glucose, Bld: 153 mg/dL — ABNORMAL HIGH (ref 70–99)
Potassium: 4.4 meq/L (ref 3.5–5.1)
Sodium: 139 meq/L (ref 135–145)

## 2024-11-11 LAB — URINALYSIS, ROUTINE W REFLEX MICROSCOPIC
Bilirubin Urine: NEGATIVE
Ketones, ur: NEGATIVE
Nitrite: POSITIVE — AB
Specific Gravity, Urine: 1.02 (ref 1.000–1.030)
Total Protein, Urine: 100 — AB
Urine Glucose: NEGATIVE
Urobilinogen, UA: 0.2 (ref 0.0–1.0)
pH: 6 (ref 5.0–8.0)

## 2024-11-11 LAB — MICROALBUMIN / CREATININE URINE RATIO
Creatinine,U: 161.9 mg/dL
Microalb Creat Ratio: 464.6 mg/g — ABNORMAL HIGH (ref 0.0–30.0)
Microalb, Ur: 75.2 mg/dL — ABNORMAL HIGH (ref 0.7–1.9)

## 2024-11-11 LAB — LIPID PANEL
Cholesterol: 179 mg/dL (ref 28–200)
HDL: 39.5 mg/dL
LDL Cholesterol: 104 mg/dL — ABNORMAL HIGH (ref 10–99)
NonHDL: 139.36
Total CHOL/HDL Ratio: 5
Triglycerides: 177 mg/dL — ABNORMAL HIGH (ref 10.0–149.0)
VLDL: 35.4 mg/dL (ref 0.0–40.0)

## 2024-11-11 LAB — CBC WITH DIFFERENTIAL/PLATELET
Basophils Absolute: 0.1 10*3/uL (ref 0.0–0.1)
Basophils Relative: 0.8 % (ref 0.0–3.0)
Eosinophils Absolute: 0.2 10*3/uL (ref 0.0–0.7)
Eosinophils Relative: 2.6 % (ref 0.0–5.0)
HCT: 42.7 % (ref 39.0–52.0)
Hemoglobin: 14.1 g/dL (ref 13.0–17.0)
Lymphocytes Relative: 18.1 % (ref 12.0–46.0)
Lymphs Abs: 1.5 10*3/uL (ref 0.7–4.0)
MCHC: 33.1 g/dL (ref 30.0–36.0)
MCV: 85.5 fl (ref 78.0–100.0)
Monocytes Absolute: 0.8 10*3/uL (ref 0.1–1.0)
Monocytes Relative: 9.2 % (ref 3.0–12.0)
Neutro Abs: 5.7 10*3/uL (ref 1.4–7.7)
Neutrophils Relative %: 69.3 % (ref 43.0–77.0)
Platelets: 214 10*3/uL (ref 150.0–400.0)
RBC: 5 Mil/uL (ref 4.22–5.81)
RDW: 14 % (ref 11.5–15.5)
WBC: 8.2 10*3/uL (ref 4.0–10.5)

## 2024-11-11 LAB — HEPATIC FUNCTION PANEL
ALT: 10 U/L (ref 3–53)
AST: 12 U/L (ref 5–37)
Albumin: 3.9 g/dL (ref 3.5–5.2)
Alkaline Phosphatase: 48 U/L (ref 39–117)
Bilirubin, Direct: 0.1 mg/dL (ref 0.1–0.3)
Total Bilirubin: 0.5 mg/dL (ref 0.2–1.2)
Total Protein: 7 g/dL (ref 6.0–8.3)

## 2024-11-11 LAB — VITAMIN D 25 HYDROXY (VIT D DEFICIENCY, FRACTURES): VITD: 53.38 ng/mL (ref 30.00–100.00)

## 2024-11-11 LAB — VITAMIN B12: Vitamin B-12: 837 pg/mL (ref 211–911)

## 2024-11-11 LAB — TSH: TSH: 0.94 u[IU]/mL (ref 0.35–5.50)

## 2024-11-11 LAB — HEMOGLOBIN A1C: Hgb A1c MFr Bld: 7 % — ABNORMAL HIGH (ref 4.6–6.5)

## 2024-11-13 ENCOUNTER — Encounter: Payer: Self-pay | Admitting: Internal Medicine

## 2024-11-13 ENCOUNTER — Ambulatory Visit: Admitting: Internal Medicine

## 2024-11-13 VITALS — BP 160/82 | HR 40 | Temp 97.6°F | Ht 68.0 in | Wt 167.0 lb

## 2024-11-13 DIAGNOSIS — N1831 Chronic kidney disease, stage 3a: Secondary | ICD-10-CM

## 2024-11-13 DIAGNOSIS — E538 Deficiency of other specified B group vitamins: Secondary | ICD-10-CM | POA: Diagnosis not present

## 2024-11-13 DIAGNOSIS — Z0000 Encounter for general adult medical examination without abnormal findings: Secondary | ICD-10-CM

## 2024-11-13 DIAGNOSIS — E1122 Type 2 diabetes mellitus with diabetic chronic kidney disease: Secondary | ICD-10-CM | POA: Diagnosis not present

## 2024-11-13 DIAGNOSIS — Z7984 Long term (current) use of oral hypoglycemic drugs: Secondary | ICD-10-CM

## 2024-11-13 DIAGNOSIS — I10 Essential (primary) hypertension: Secondary | ICD-10-CM

## 2024-11-13 DIAGNOSIS — Z0001 Encounter for general adult medical examination with abnormal findings: Secondary | ICD-10-CM

## 2024-11-13 MED ORDER — EZETIMIBE 10 MG PO TABS: 10.0000 mg | ORAL_TABLET | Freq: Every day | ORAL | 3 refills | Status: AC

## 2024-11-13 MED ORDER — FINASTERIDE 5 MG PO TABS: 5.0000 mg | ORAL_TABLET | Freq: Every day | ORAL | 2 refills | Status: AC

## 2024-11-13 MED ORDER — CITALOPRAM HYDROBROMIDE 10 MG PO TABS: 10.0000 mg | ORAL_TABLET | Freq: Every day | ORAL | 3 refills | Status: AC

## 2024-11-13 MED ORDER — FENOFIBRATE 160 MG PO TABS: 160.0000 mg | ORAL_TABLET | Freq: Every day | ORAL | 2 refills | Status: AC

## 2024-11-13 MED ORDER — METOPROLOL SUCCINATE ER 50 MG PO TB24: 50.0000 mg | ORAL_TABLET | Freq: Every day | ORAL | 3 refills | Status: AC

## 2024-11-13 MED ORDER — TRAZODONE HCL 50 MG PO TABS: 25.0000 mg | ORAL_TABLET | Freq: Every evening | ORAL | 1 refills | Status: AC | PRN

## 2024-11-13 MED ORDER — METHENAMINE HIPPURATE 1 G PO TABS: 1.0000 g | ORAL_TABLET | Freq: Two times a day (BID) | ORAL | 1 refills | Status: AC

## 2024-11-13 MED ORDER — TERAZOSIN HCL 2 MG PO CAPS: 2.0000 mg | ORAL_CAPSULE | Freq: Every day | ORAL | 3 refills | Status: AC

## 2024-11-13 NOTE — Patient Instructions (Addendum)
 Ok to start the chlorthalidone 12.5 mg as sent by your kidney doctor  Please continue all other medications as before, and refills have been done as requested.  Please have the pharmacy call with any other refills you may need.  Please continue your efforts at being more active, low cholesterol diet, and weight control.  You are otherwise up to date with prevention measures today.  Please keep your appointments with your specialists as you may have planned  Your lab work was Excellent!  Please make an Appointment to return in 6 months, or sooner if needed, with a Nurse Practitioner

## 2024-11-13 NOTE — Assessment & Plan Note (Signed)
 Ckd3a  Lab Results  Component Value Date   CREATININE 1.31 11/11/2024   Stable overall, cont to avoid nephrotoxins Lab Results  Component Value Date   HGBA1C 7.0 (H) 11/11/2024   Stable, pt to continue current medical treatment metformin  500 mg qd

## 2024-11-13 NOTE — Progress Notes (Unsigned)
 Patient ID: Neil [name], male   DOB: [birth year], 84 y.o.   MRN: [id number]         Chief Complaint:: wellness exam and htn, hld       HPI:  Neil [name] is a 84 y.o. male here for wellness exam; up to date                        Also sees renal on regular basis, stable;  BP has been monitored by renal Dr Melia, recently prescribed chlorthalidone 12.5 mg per day yesterday, pt not yet started.  Pt denies chest pain, increased sob or doe, wheezing, orthopnea, PND, increased LE swelling, palpitations, dizziness or syncope.   Pt denies polydipsia, polyuria, or new focal neuro s/s.    Pt denies fever, wt loss, night sweats, loss of appetite, or other constitutional symptoms     Wt Readings from Last 3 Encounters:  11/13/24 167 lb (75.8 kg)  11/02/24 162 lb (73.5 kg)  09/04/24 157 lb (71.2 kg)   BP Readings from Last 3 Encounters:  11/13/24 (!) 160/82  09/30/24 (!) 165/69  07/15/24 122/70   Immunization History  Administered Date(s) Administered   Fluad Quad(high Dose 65+) 06/05/2019, 07/07/2020, 06/25/2022, 06/30/2023   INFLUENZA, HIGH DOSE SEASONAL PF 07/03/2016, 08/02/2017, 07/10/2018, 07/15/2024   Influenza Split 07/04/2012   Influenza Whole 07/21/2009, 11/06/2010   Influenza,inj,Quad PF,6+ Mos 06/24/2013, 06/25/2014, 07/12/2015   Influenza-Unspecified 07/11/2021   PFIZER(Purple Top)SARS-COV-2 Vaccination 10/27/2019, 11/16/2019, 07/10/2020, 01/19/2021, 01/01/2023   Pfizer Covid-19 Vaccine Bivalent Booster 54yrs & up 07/11/2021, 02/14/2022   Pneumococcal Conjugate-13 03/17/2014   Pneumococcal Polysaccharide-23 11/14/2011   Respiratory Syncytial Virus Vaccine,Recomb Aduvanted(Arexvy) 06/25/2022   Td 07/21/2009   Tdap 01/16/2022   Zoster Recombinant(Shingrix) 01/16/2022, 01/08/2023  There are no preventive care reminders to display for this patient.    Past Medical History:  Diagnosis Date   ALLERGIC RHINITIS 12/04/2007   Allergy    Anxiety    BENIGN PROSTATIC HYPERTROPHY  06/05/2007   BUNDLE BRANCH BLOCK, RIGHT 07/21/2009   BURSITIS, RIGHT SHOULDER 11/06/2010   Choledocholithiasis    CKD (chronic kidney disease) stage 3, GFR 30-59 ml/min (HCC) 08/02/2017   COLONIC POLYPS, HX OF 06/05/2007   tubular adenoma also 02/19/2013   Coronary artery calcification seen on CT scan 03/07/2016   Depression    DIABETES MELLITUS, TYPE [name] 12/04/2007   DIVERTICULOSIS, COLON 07/21/2009   ERECTILE DYSFUNCTION 06/05/2007   Gallstones    HYPERLIPIDEMIA 06/05/2007   on meds   HYPERTENSION 06/05/2007   on meds   Hypotension    INGUINAL HERNIA, RIGHT, SMALL 11/06/2010   Liver abscess 05/23/2015   SKIN LESION 06/03/2008   Ulcer    Past Surgical History:  Procedure Laterality Date   BIOPSY  09/08/2021   Procedure: BIOPSY;  Surgeon: Eda Iha, MD;  Location: [facility];  Service: Endoscopy;;   CHOLECYSTECTOMY N/A 03/02/2015   Procedure: LAPAROSCOPIC CHOLECYSTECTOMY;  Surgeon: Elon Pacini, MD;  Location: [facility];  Service: General;  Laterality: N/A;   COLONOSCOPY  2017   JP-MAC-suprep (good)-TA   CYSTOSCOPY N/A 05/29/2022   Procedure: PHYLLIS;  Surgeon: Rosalind Zachary NOVAK, MD;  Location: [facility];  Service: Urology;  Laterality: N/A;   ERCP N/A 03/01/2015   Procedure: ENDOSCOPIC RETROGRADE CHOLANGIOPANCREATOGRAPHY (ERCP);  Surgeon: Norleen LOISE Kiang, MD;  Location: [facility];  Service: Endoscopy;  Laterality: N/A;  Dr Pacini wants patient to stay overnight after ERCP   ERCP N/A 05/03/2015  Procedure: ENDOSCOPIC RETROGRADE CHOLANGIOPANCREATOGRAPHY (ERCP);  Surgeon: Norleen LOISE Kiang, MD;  Location: [facility];  Service: Endoscopy;  Laterality: N/A;   FINGER SURGERY Left    little finger   FLEXIBLE SIGMOIDOSCOPY N/A 09/08/2021   Procedure: FLEXIBLE SIGMOIDOSCOPY;  Surgeon: Eda Iha, MD;  Location: [facility];  Service: Endoscopy;  Laterality: N/A;   FRACTURE SURGERY     HEMORRHOID SURGERY  1973   INGUINAL HERNIA REPAIR Right 2014   8'14 repair    INSERTION OF SUPRAPUBIC CATHETER N/A 05/29/2022   Procedure: INSERTION OF SUPRAPUBIC CATHETER;  Surgeon: Rosalind Zachary NOVAK, MD;  Location: [facility];  Service: Urology;  Laterality: N/A;  30 MINS   POLYPECTOMY     TONSILLECTOMY     WISDOM TOOTH EXTRACTION  1972    reports that he quit smoking about 18 years ago. His smoking use included cigarettes. He has never used smokeless tobacco. He reports that he does not currently use alcohol. He reports that he does not use drugs. family history includes Brain cancer (age of onset: 62) in his mother; Colon cancer in his paternal uncle; Colon cancer (age of onset: 37) in his mother; Colon polyps in his paternal uncle; Colon polyps (age of onset: 91) in his mother. Allergies[1] Medications Ordered Prior to Encounter[2]      ROS:  All others reviewed and negative.  Objective        PE:  BP (!) 160/82 (BP Location: Left Arm, Patient Position: Sitting, Cuff Size: Normal)   Pulse (!) 40   Temp 97.6 F (36.4 C) (Oral)   Ht 5' 8 (1.727 m)   Wt 167 lb (75.8 kg)   SpO2 99%   BMI 25.39 kg/m                 Constitutional: Pt appears in NAD               HENT: Head: NCAT.                Right Ear: External ear normal.                 Left Ear: External ear normal.                Eyes: . Pupils are equal, round, and reactive to light. Conjunctivae and EOM are normal               Nose: without d/c or deformity               Neck: Neck supple. Gross normal ROM               Cardiovascular: Normal rate and regular rhythm.                 Pulmonary/Chest: Effort normal and breath sounds without rales or wheezing.                Abd:  Soft, NT, ND, + BS, no organomegaly               Neurological: Pt is alert. At baseline orientation, motor grossly intact               Skin: Skin is warm. No rashes, no other new lesions, LE edema - none               Psychiatric: Pt behavior is normal without agitation   Micro: none  Cardiac tracings I have personally  interpreted today:  none  Pertinent  Radiological findings (summarize): none   Lab Results  Component Value Date   WBC 8.2 11/11/2024   HGB 14.1 11/11/2024   HCT 42.7 11/11/2024   PLT 214.0 11/11/2024   GLUCOSE 153 (H) 11/11/2024   CHOL 179 11/11/2024   TRIG 177.0 (H) 11/11/2024   HDL 39.50 11/11/2024   LDLDIRECT 106.0 05/18/2021   LDLCALC 104 (H) 11/11/2024   ALT 10 11/11/2024   AST 12 11/11/2024   NA 139 11/11/2024   K 4.4 11/11/2024   CL 103 11/11/2024   CREATININE 1.31 11/11/2024   BUN 20 11/11/2024   CO2 28 11/11/2024   TSH 0.94 11/11/2024   PSA 0.57 01/08/2023   INR 1.25 05/23/2015   HGBA1C 7.0 (H) 11/11/2024   MICROALBUR 75.2 (H) 11/11/2024   Assessment/Plan:  Neil [name] is a 84 y.o. White or Caucasian [1] male with  has a past medical history of ALLERGIC RHINITIS (12/04/2007), Allergy, Anxiety, BENIGN PROSTATIC HYPERTROPHY (06/05/2007), BUNDLE BRANCH BLOCK, RIGHT (07/21/2009), BURSITIS, RIGHT SHOULDER (11/06/2010), Choledocholithiasis, CKD (chronic kidney disease) stage 3, GFR 30-59 ml/min (HCC) (08/02/2017), COLONIC POLYPS, HX OF (06/05/2007), Coronary artery calcification seen on CT scan (03/07/2016), Depression, DIABETES MELLITUS, TYPE [name] (12/04/2007), DIVERTICULOSIS, COLON (07/21/2009), ERECTILE DYSFUNCTION (06/05/2007), Gallstones, HYPERLIPIDEMIA (06/05/2007), HYPERTENSION (06/05/2007), Hypotension, INGUINAL HERNIA, RIGHT, SMALL (11/06/2010), Liver abscess (05/23/2015), SKIN LESION (06/03/2008), and Ulcer.  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 I have personally reviewed and have noted: 1) the patient's medical and social history 2) The patient's current medications and supplements 3) The patient's height, weight, and BMI have been recorded in the chart   B12 deficiency Lab Results  Component Value Date   VITAMINB12 837 11/11/2024   Stable, cont oral replacement - b12 1000 mcg qd   CKD (chronic kidney disease) stage 3, GFR 30-59 ml/min (HCC) Ckd3a  Lab Results  Component Value Date   CREATININE 1.31 11/11/2024   Stable overall, cont to avoid nephrotoxins   Essential hypertension, benign Uncontrolled, pt encouraged to start the chlorthalidone 12.5 every day as started yesterday per renal  Followup: Return in about 6 months (around 05/13/2025).  Lynwood Rush, MD 11/14/2024 10:48 AM [facility]     [1]  Allergies Allergen Reactions   Bactrim [Sulfamethoxazole-Trimethoprim] Other (See Comments)    Patient request Bactrim be listed as an allergy due to his UTI was not responsive to Bactrim and he feels like it hindered his treatment.   Statins Other (See Comments)    Joint pain   Zanaflex  [Tizanidine ] Other (See Comments)    Dizzy, sleepy  [2]  Current Outpatient Medications on File Prior to Visit  Medication Sig Dispense Refill   acetaminophen  (TYLENOL ) 500 MG tablet Take 1,500 mg by mouth daily as needed for mild pain.     Alpha-D-Galactosidase (BEANO PO) Take 1 tablet by mouth at bedtime.     aspirin  81 MG EC  tablet Take 81 mg by mouth daily.     benazepril  (LOTENSIN ) 40 MG tablet Take 40 mg by mouth daily.     Cholecalciferol (VITAMIN D ) 50 MCG (2000 UT) CAPS Take 2,000 Units by mouth at bedtime.     cyclobenzaprine  (FLEXERIL ) 5 MG tablet TAKE 1 TABLET  BY MOUTH 3 TIMES  DAILY AS NEEDED FOR MUSCLE  SPASM(S) 270 tablet 1   docusate sodium  (COLACE) 100 MG capsule Take 100 mg by mouth daily as needed for mild constipation or moderate constipation.     ibuprofen (ADVIL) 200 MG tablet Take 600 mg by mouth 2 (two) times daily as needed for moderate pain.     metFORMIN  (GLUCOPHAGE ) 500 MG tablet Take 500 mg by mouth daily with breakfast.     Multiple Vitamin (MULTIVITAMIN WITH MINERALS) TABS tablet Take 1 tablet by mouth daily. 30 tablet 0   oxybutynin  (DITROPAN -XL) 10 MG 24 hr tablet Take 10 mg by mouth daily.     sodium chloride  (OCEAN) 0.65 % nasal spray Place 1 spray into the nose daily as needed for congestion.      No current facility-administered medications on file prior to visit.

## 2024-11-14 ENCOUNTER — Encounter: Payer: Self-pay | Admitting: Internal Medicine

## 2024-11-14 NOTE — Assessment & Plan Note (Signed)
 Uncontrolled, pt encouraged to start the chlorthalidone 12.5 every day as started yesterday per renal

## 2024-11-14 NOTE — Assessment & Plan Note (Signed)

## 2024-11-14 NOTE — Assessment & Plan Note (Signed)
 Ckd3a  Lab Results  Component Value Date   CREATININE 1.31 11/11/2024   Stable overall, cont to avoid nephrotoxins

## 2024-11-20 ENCOUNTER — Other Ambulatory Visit: Payer: Self-pay

## 2024-11-20 NOTE — Patient Outreach (Signed)
 Complex Care Management   Visit Note  11/20/2024  Name:  Neil [name] MRN: [id number] DOB: 1941/09/08  Situation: Referral received for Complex Care Management related to Chronic disease I obtained verbal consent from Patient.  Visit completed with Patient  on the phone  Background:   Past Medical History:  Diagnosis Date   ALLERGIC RHINITIS 12/04/2007   Allergy    Anxiety    BENIGN PROSTATIC HYPERTROPHY 06/05/2007   BUNDLE BRANCH BLOCK, RIGHT 07/21/2009   BURSITIS, RIGHT SHOULDER 11/06/2010   Choledocholithiasis    CKD (chronic kidney disease) stage 3, GFR 30-59 ml/min (HCC) 08/02/2017   COLONIC POLYPS, HX OF 06/05/2007   tubular adenoma also 02/19/2013   Coronary artery calcification seen on CT scan 03/07/2016   Depression    DIABETES MELLITUS, TYPE II 12/04/2007   DIVERTICULOSIS, COLON 07/21/2009   ERECTILE DYSFUNCTION 06/05/2007   Gallstones    HYPERLIPIDEMIA 06/05/2007   on meds   HYPERTENSION 06/05/2007   on meds   Hypotension    INGUINAL HERNIA, RIGHT, SMALL 11/06/2010   Liver abscess 05/23/2015   SKIN LESION 06/03/2008   Ulcer     Assessment: Patient to continues to follow up with Nephrologist with any questions or concerns, and he continues to follow-up with Urologist as scheduled. He is monitor his BP and HR and report them to Nephrologist.  Patient Reported Symptoms:  Cognitive Cognitive Status: Alert and oriented to person, place, and time      Neurological Neurological Review of Symptoms: No symptoms reported    HEENT HEENT Symptoms Reported: No symptoms reported      Cardiovascular Cardiovascular Symptoms Reported: No symptoms reported Does patient have uncontrolled Hypertension?: Yes Is patient checking Blood Pressure at home?: Yes Patient's Recent BP reading at home: Patient reports latest BP 145/65  HR 47 yesterday around 2 p.m.patient continues to follow up with Nephrology, he reports he continues to report BP reading to Nephrology. He is  waiting a return call from Nephrology at this time regarding questions he has about Chlorthalidon (does not like to split pill).    Respiratory Respiratory Symptoms Reported: No symptoms reported    Endocrine Endocrine Symptoms Reported: No symptoms reported Is patient diabetic?: No Is patient checking blood sugars at home?: No    Gastrointestinal Gastrointestinal Symptoms Reported: No symptoms reported      Genitourinary Genitourinary Symptoms Reported: No symptoms reported Other Genitourinary Symptoms: Patient reports he has Urology appointment on 2/9 for Suprapubic catheter change.    Integumentary Integumentary Symptoms Reported: No symptoms reported    Musculoskeletal Musculoskelatal Symptoms Reviewed: No symptoms reported Additional Musculoskeletal Details: patient reports he uses walker most of the time when he is out.   Falls in the past year?: No    Psychosocial Psychosocial Symptoms Reported: No symptoms reported          Today's Vitals   11/20/24 1401  BP: (!) 145/65  Pulse: (!) 47   Pain Score: 0-No pain  Medications Reviewed Today     Reviewed by Lenita Fountain, RN (Registered Nurse) on 11/20/24 at 1357  Med List Status: <None>   Medication Order Taking? Sig Documenting Provider Last Dose Status Informant  acetaminophen  (TYLENOL ) 500 MG tablet [id number] Yes Take 1,500 mg by mouth daily as needed for mild pain. [provider]  Active Self, Pharmacy Records  Alpha-D-Galactosidase [facility] PO) [id number] Yes Take 1 tablet by mouth at bedtime. [provider]  Active Self, Pharmacy Records  aspirin  81 MG EC tablet [id number] Yes  Take 81 mg by mouth daily. [provider]  Active Self, Pharmacy Records  benazepril  (LOTENSIN ) 40 MG tablet [id number] Yes Take 40 mg by mouth daily. [provider]  Active   Cholecalciferol (VITAMIN D ) 50 MCG (2000 UT) CAPS [id number] Yes Take 2,000 Units by mouth at bedtime. [provider]   Active Self, Pharmacy Records  citalopram  (CELEXA ) 10 MG tablet [id number] Yes Take 1 tablet (10 mg total) by mouth daily. Norleen Lynwood ORN, MD  Active   cyclobenzaprine  (FLEXERIL ) 5 MG tablet [id number] Yes TAKE 1 TABLET BY MOUTH 3 TIMES  DAILY AS NEEDED FOR MUSCLE  SPASM(S) Norleen Lynwood ORN, MD  Active Self, Pharmacy Records  docusate sodium  (COLACE) 100 MG capsule [id number] Yes Take 100 mg by mouth daily as needed for mild constipation or moderate constipation. [provider]  Active Self, Pharmacy Records  ezetimibe  (ZETIA ) 10 MG tablet [id number] Yes Take 1 tablet (10 mg total) by mouth daily. Norleen Lynwood ORN, MD  Active   fenofibrate  160 MG tablet [id number]  Take 1 tablet (160 mg total) by mouth at bedtime.  Patient not taking: Reported on 11/20/2024   Norleen Lynwood ORN, MD  Active   finasteride  (PROSCAR ) 5 MG tablet [id number] Yes Take 1 tablet (5 mg total) by mouth daily. Norleen Lynwood ORN, MD  Active   ibuprofen (ADVIL) 200 MG tablet [id number] Yes Take 600 mg by mouth 2 (two) times daily as needed for moderate pain. [provider]  Active Self, Pharmacy Records  metFORMIN  (GLUCOPHAGE ) 500 MG tablet [id number]  Take 500 mg by mouth daily with breakfast.  Patient not taking: Reported on 11/20/2024   [provider]  Active   methenamine  (HIPREX ) 1 g tablet [id number] Yes Take 1 tablet (1 g total) by mouth 2 (two) times daily. Norleen Lynwood ORN, MD  Active   metoprolol  succinate (TOPROL -XL) 50 MG 24 hr tablet [id number] Yes Take 1 tablet (50 mg total) by mouth daily. Take with or immediately following a meal. Norleen Lynwood ORN, MD  Active   Multiple Vitamin (MULTIVITAMIN WITH MINERALS) TABS tablet [id number] Yes Take 1 tablet by mouth daily. Rizwan, Saima, MD  Active Self, Pharmacy Records  oxybutynin  (DITROPAN -XL) 10 MG 24 hr tablet [id number] Yes Take 10 mg by mouth daily. [provider]  Active Self, Pharmacy Records  sodium chloride  (OCEAN) 0.65 % nasal spray [id number] Yes Place 1 spray  into the nose daily as needed for congestion.  [provider]  Active Self, Pharmacy Records  terazosin  (HYTRIN ) 2 MG capsule [id number] Yes Take 1 capsule (2 mg total) by mouth at bedtime. Norleen Lynwood ORN, MD  Active   traZODone  (DESYREL ) 50 MG tablet [id number] Yes Take 0.5-1 tablets (25-50 mg total) by mouth at bedtime as needed. for sleep Norleen Lynwood ORN, MD  Active            Recommendation:   Specialty provider follow-up  Urology on 11/23/24 and Nephrology as recommended. Continue Current Plan of Care  Follow Up Plan:   Telephone follow up appointment date/time:  12/16/24 at 2:00  Franklin Honour, RN, BSN [place]/ [facility] RN care manager Direct dial: [phone]

## 2024-11-20 NOTE — Patient Instructions (Addendum)
 Visit Information  Thank you for taking time to visit with me today. Please don't hesitate to contact me if I can be of assistance to you before our next scheduled appointment.  Your next care management appointment is by telephone on 12/16/24 at 2:00 p.m  Please call the care guide team at [phone] if you need to cancel, schedule, or reschedule an appointment.   Please call the Suicide and Crisis Lifeline: 988 call the [place]  National Suicide Prevention Lifeline: 775-779-8863 or TTY: 515 696 7901 TTY [phone]) to talk to a trained counselor if you are experiencing a Mental Health or Behavioral Health Crisis or need someone to talk to.  Franklin Honour, RN, BSN [place]/ [facility] RN care Insurance Underwriter dial: [phone]

## 2024-12-16 ENCOUNTER — Telehealth

## 2025-01-13 ENCOUNTER — Ambulatory Visit: Admitting: Internal Medicine

## 2025-05-26 ENCOUNTER — Ambulatory Visit: Admitting: Family Medicine
